# Patient Record
Sex: Female | Born: 1990 | Race: Black or African American | Hispanic: No | Smoking: Current every day smoker
Health system: Southern US, Community
[De-identification: ages and names within clinical notes are randomized; demographics above are authoritative.]

## PROBLEM LIST (undated history)

## (undated) ENCOUNTER — Inpatient Hospital Stay (HOSPITAL_COMMUNITY): Payer: Self-pay

## (undated) DIAGNOSIS — Z8614 Personal history of Methicillin resistant Staphylococcus aureus infection: Secondary | ICD-10-CM

## (undated) DIAGNOSIS — A549 Gonococcal infection, unspecified: Secondary | ICD-10-CM

## (undated) DIAGNOSIS — A599 Trichomoniasis, unspecified: Secondary | ICD-10-CM

## (undated) DIAGNOSIS — A749 Chlamydial infection, unspecified: Secondary | ICD-10-CM

## (undated) HISTORY — PX: NO PAST SURGERIES: SHX2092

## (undated) HISTORY — PX: MULTIPLE TOOTH EXTRACTIONS: SHX2053

---
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
## 2010-01-05 ENCOUNTER — Inpatient Hospital Stay (HOSPITAL_COMMUNITY)
Admission: AD | Admit: 2010-01-05 | Discharge: 2010-01-05 | Payer: Self-pay | Source: Home / Self Care | Admitting: Obstetrics & Gynecology

## 2010-01-05 IMAGING — US US OB COMP LESS 14 WK
1 series · 14 of 26 positions shown · non-contrast
Comparison: None.

CLINICAL DATA: Pregnant female with cramping abdominal pain.

OBSTETRIC <14 WK US AND TRANSVAGINAL OB US
TECHNIQUE: Both transabdominal and transvaginal ultrasound
examinations were performed for complete evaluation of the
gestation as well as the maternal uterus, adnexal regions, and
pelvic cul-de-sac.  Transvaginal technique was performed to assess
early pregnancy.

[Series 1: us ob comp less 14 wks · 0.21mm/px · 14 of 26 slices shown]
[im 1/26]
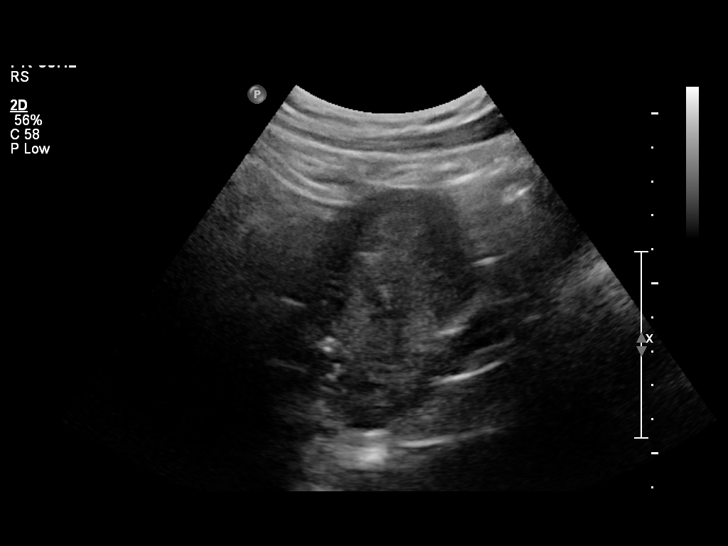
[im 3/26]
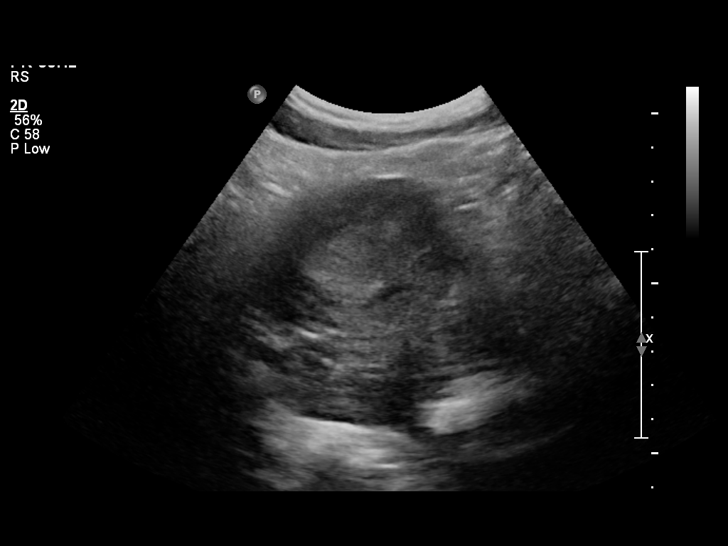
[im 5/26]
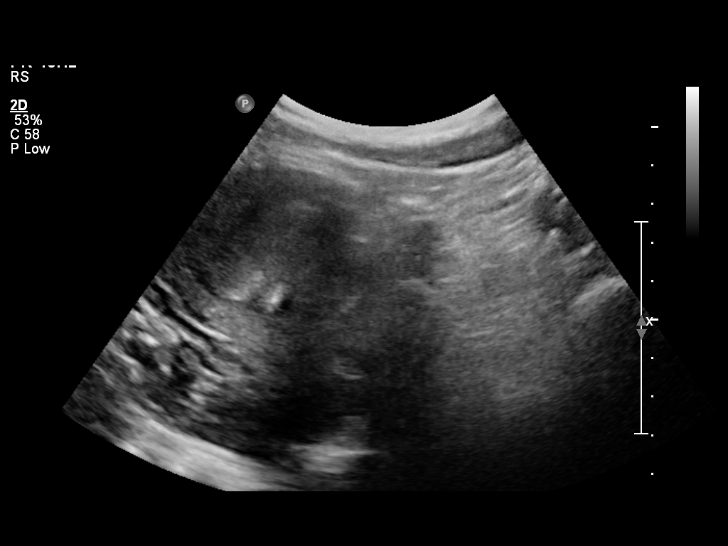
[im 7/26]
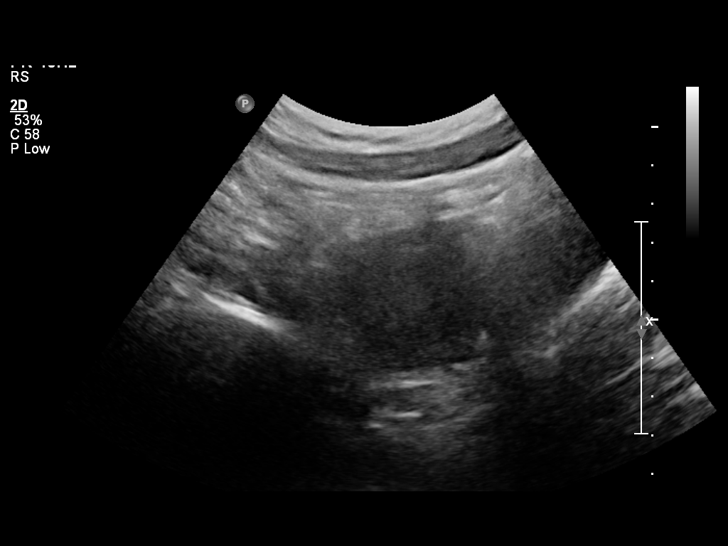
[im 9/26]
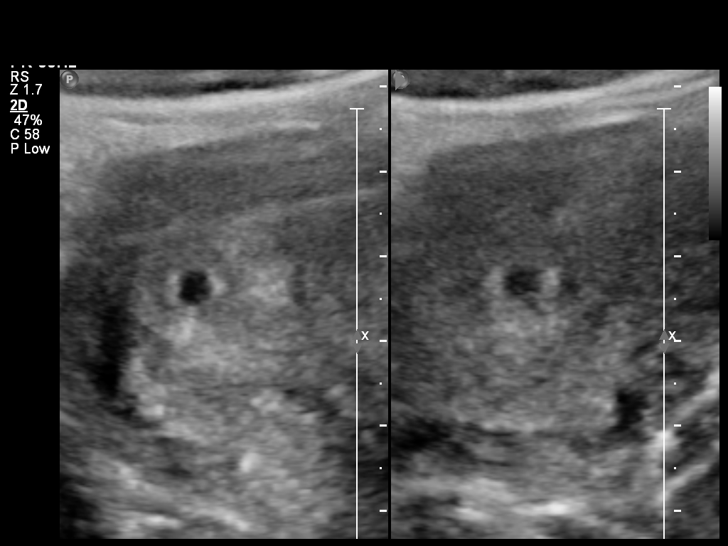
[im 11/26]
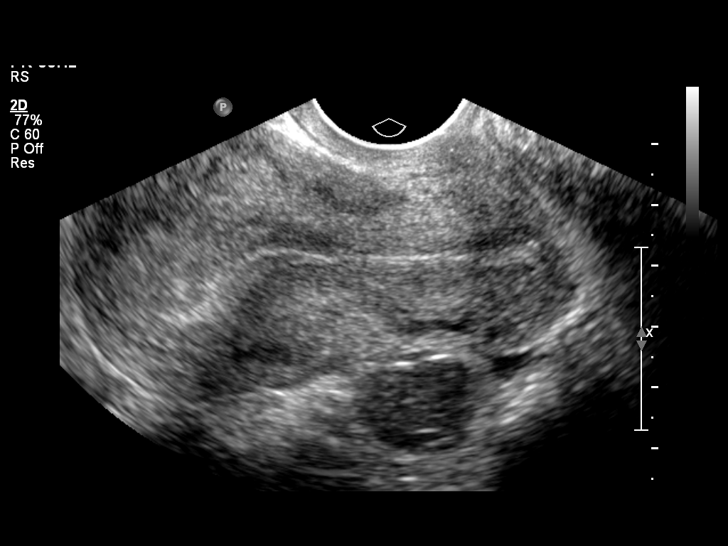
[im 13/26]
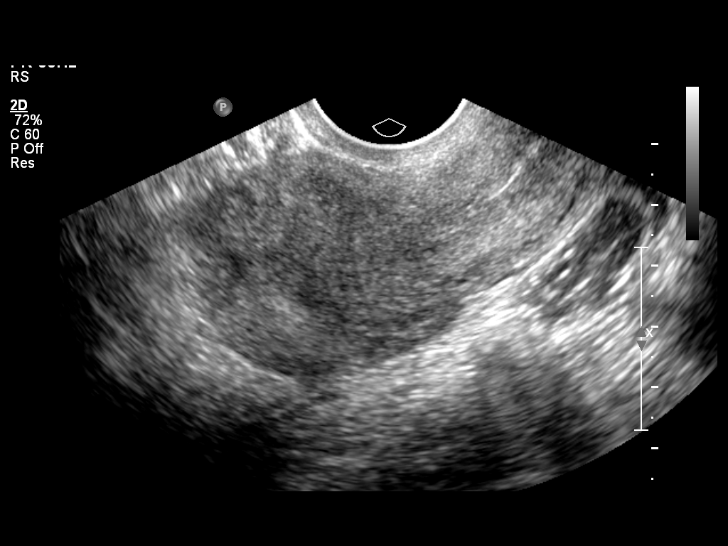
[im 14/26]
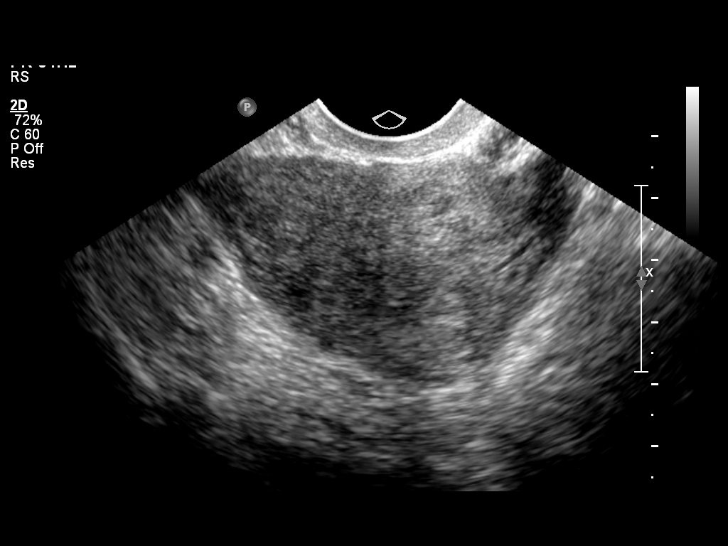
[im 16/26]
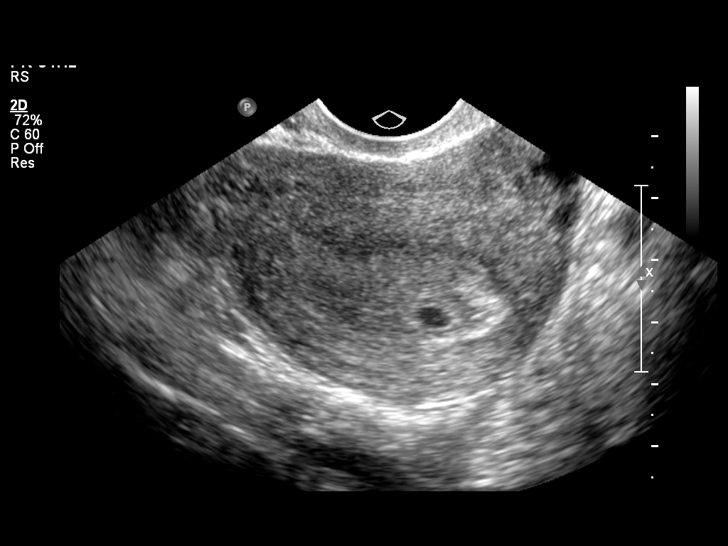
[im 18/26]
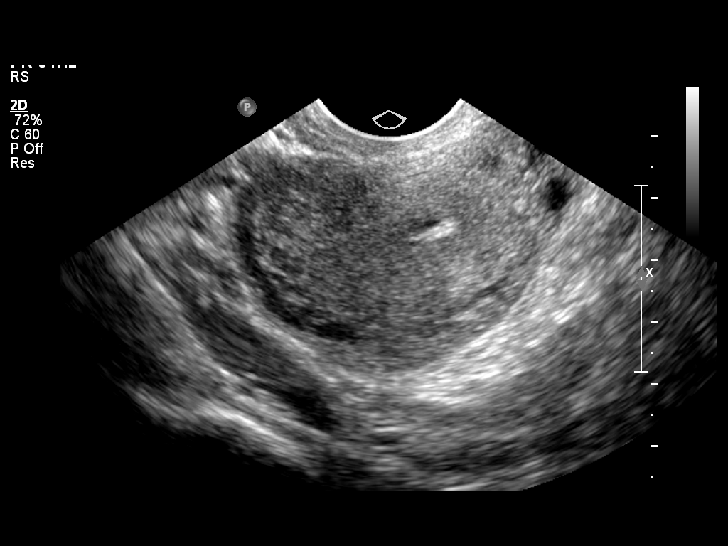
[im 20/26]
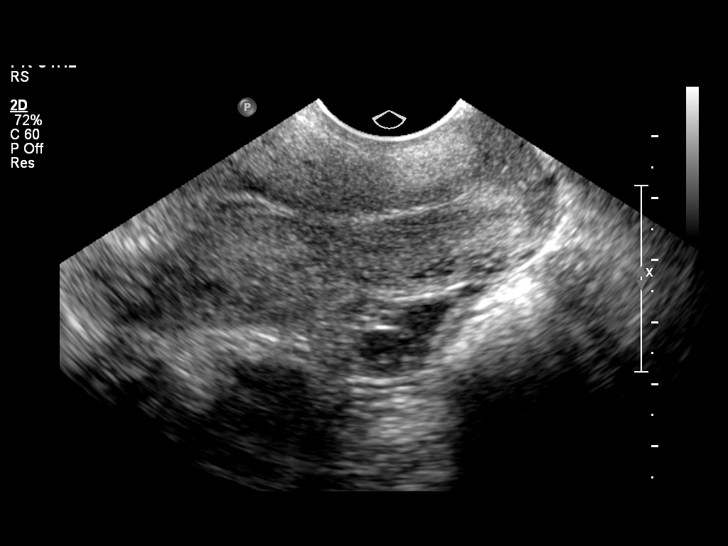
[im 22/26]
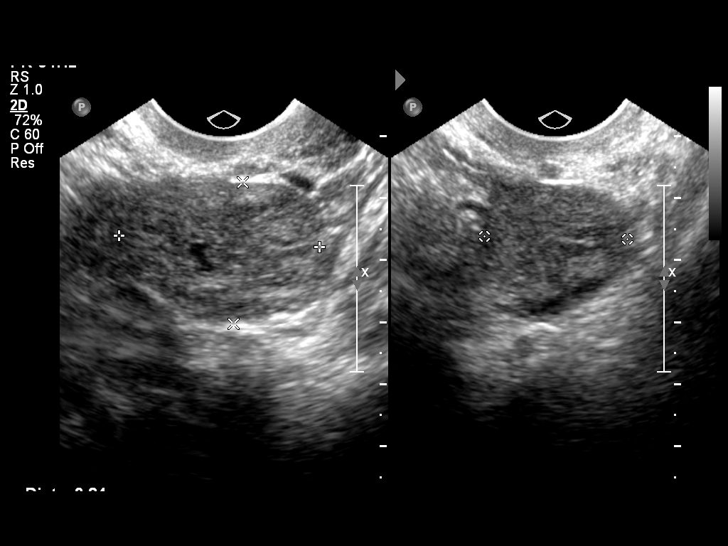
[im 24/26]
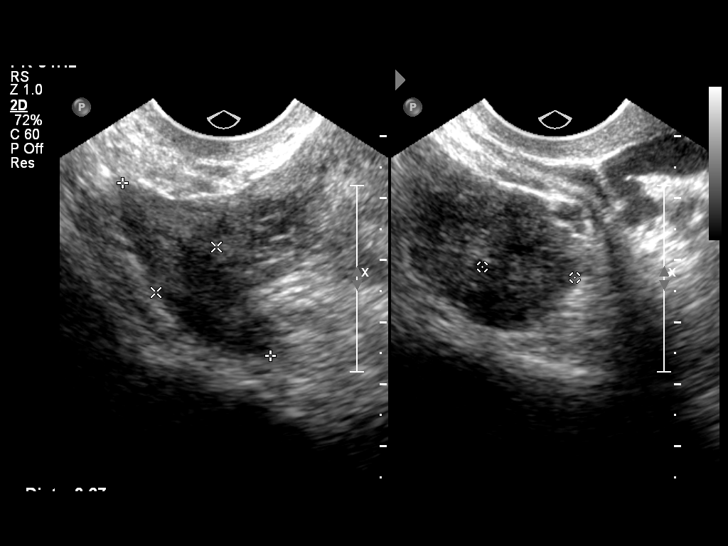
[im 26/26]
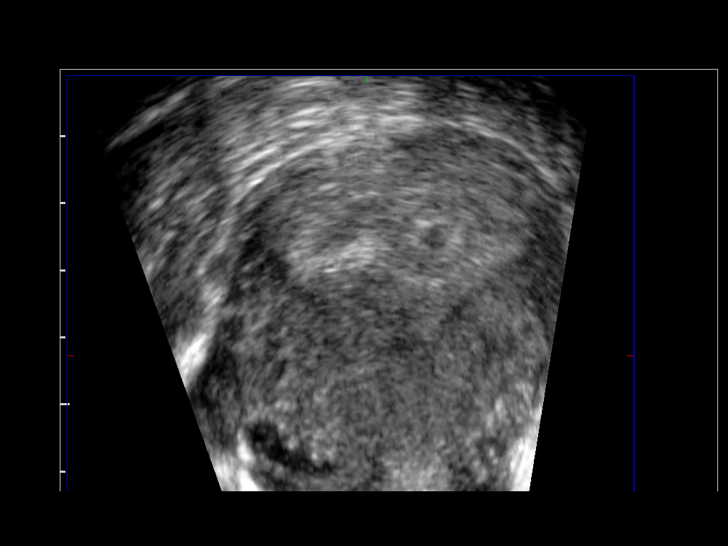

[14 of 26 positions shown; findings below may reference images not displayed]

Intrauterine gestational sac:  Single gestational sac is
identified.
Yolk sac: Not visualized
Embryo: Not visualized
Cardiac Activity: Not detected.
MSD: 4.4 mm sac diameter is too small to calculate gestational age.

Adnexa are unremarkable.
IMPRESSION: Single 4 mm cystic structure within the endometrial canal is
compatible with a gestational sac.  No evidence of ectopic
pregnancy is identified.  Recommend correlation with quantitative
HCG.  Repeat ultrasound as needed.

## 2010-01-07 ENCOUNTER — Ambulatory Visit (HOSPITAL_COMMUNITY)
Admission: RE | Admit: 2010-01-07 | Discharge: 2010-01-07 | Payer: Self-pay | Source: Home / Self Care | Admitting: Obstetrics & Gynecology

## 2010-01-09 ENCOUNTER — Inpatient Hospital Stay (HOSPITAL_COMMUNITY): Admission: AD | Admit: 2010-01-09 | Discharge: 2010-01-09 | Payer: Self-pay | Admitting: Obstetrics and Gynecology

## 2010-01-09 IMAGING — US US OB TRANSVAGINAL
1 series · 14 of 28 positions shown · non-contrast
Comparison: none

[Series 1: us ob transvaginal · 0.13mm/px · 14 of 29 slices shown]
[im 2/29]
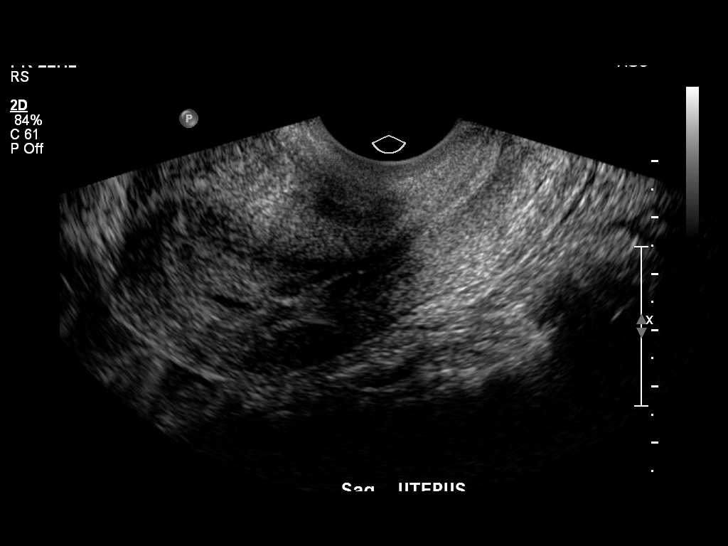
[im 4/29]
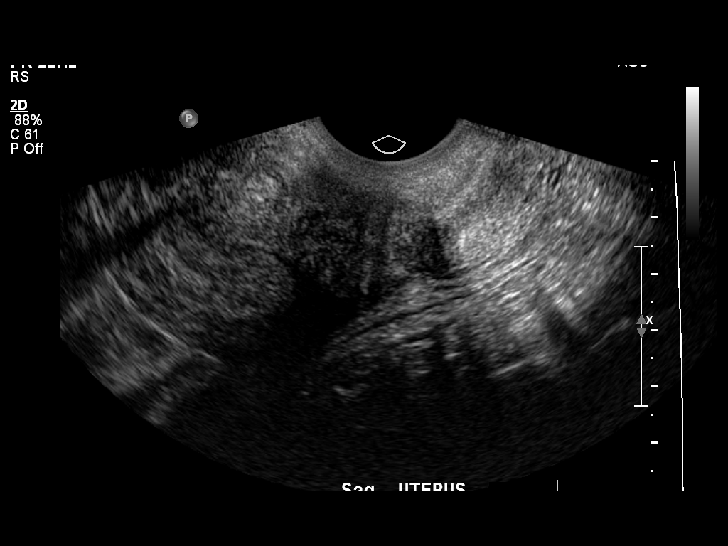
[im 6/29]
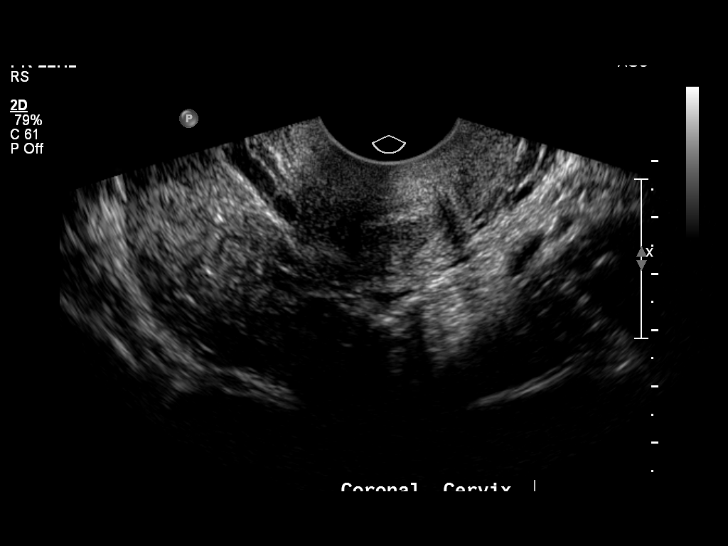
[im 8/29]
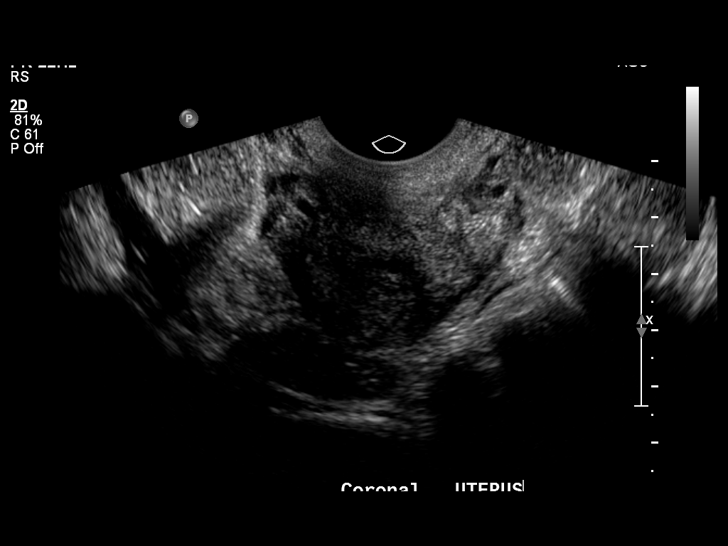
[im 10/29]
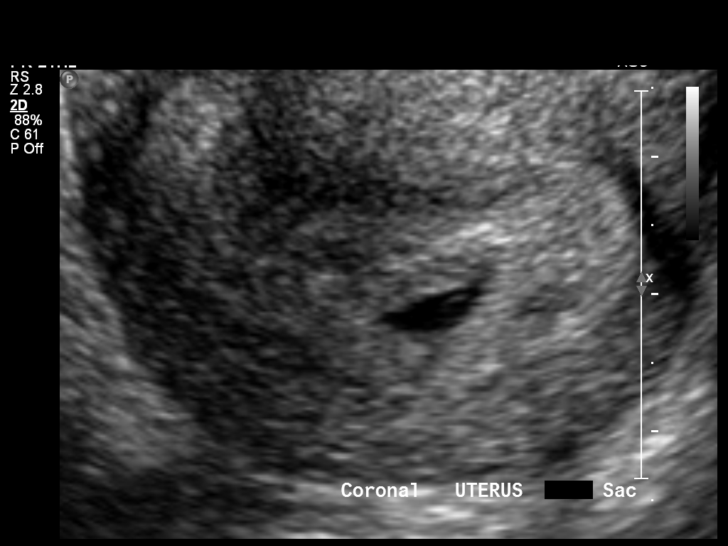
[im 12/29]
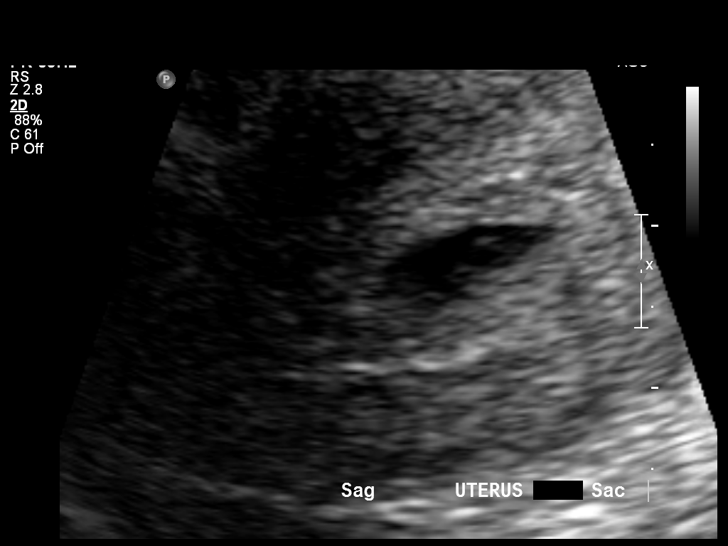
[im 14/29]
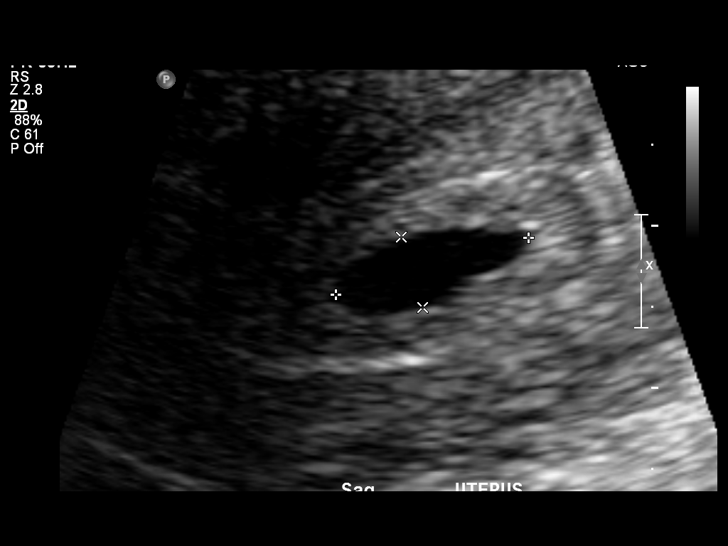
[im 16/29]
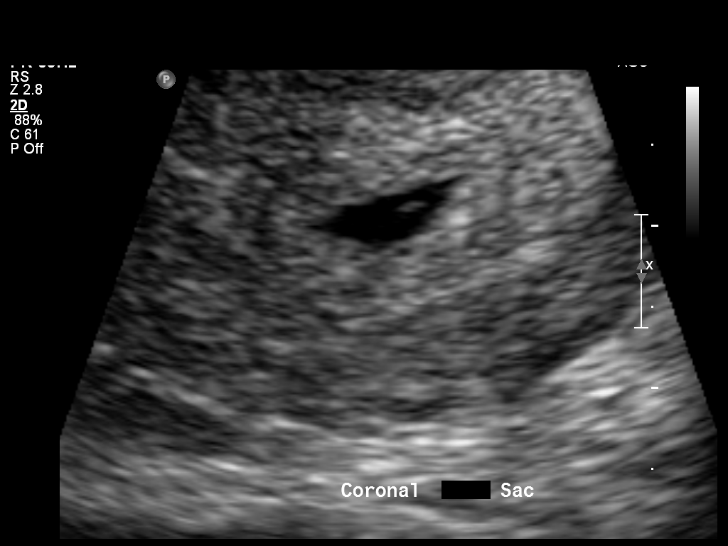
[im 18/29]
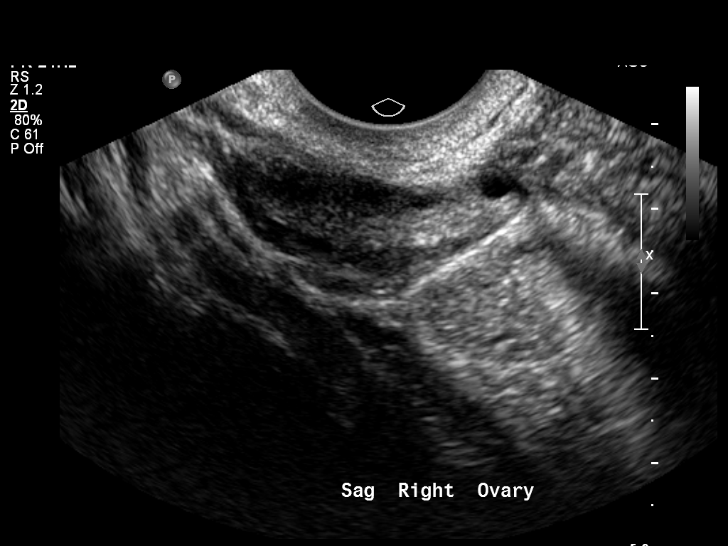
[im 20/29]
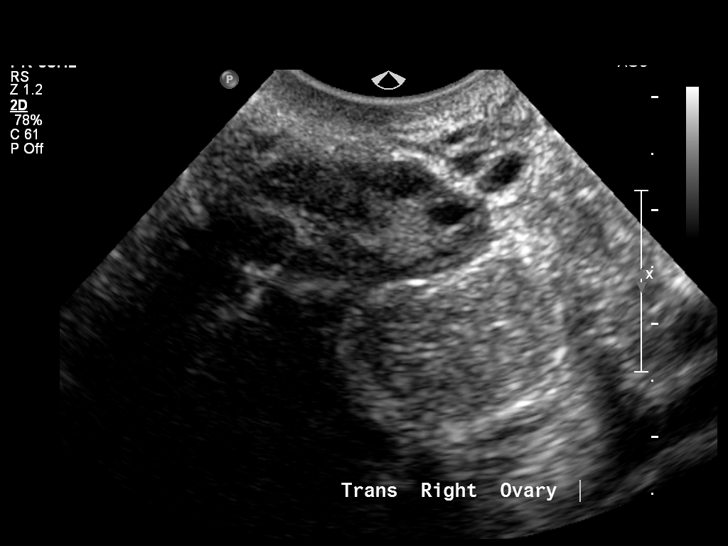
[im 22/29]
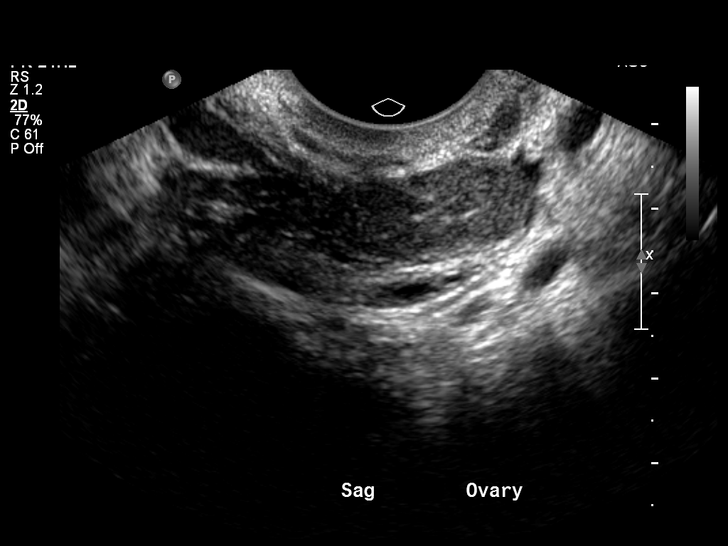
[im 24/29]
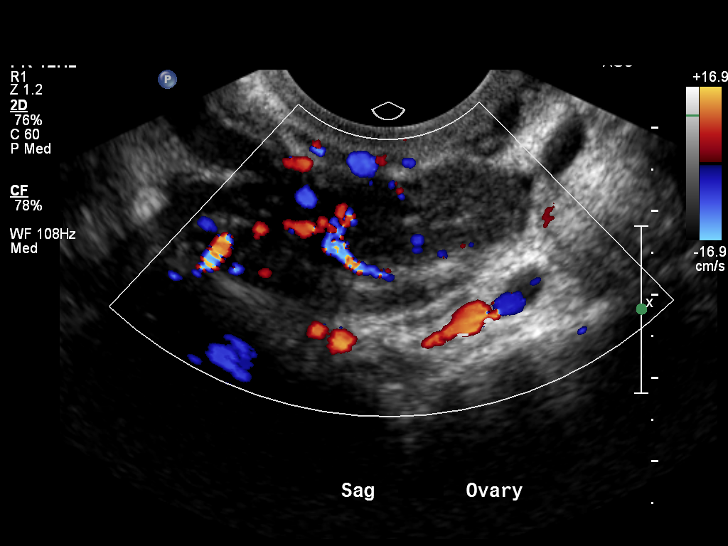
[im 26/29]
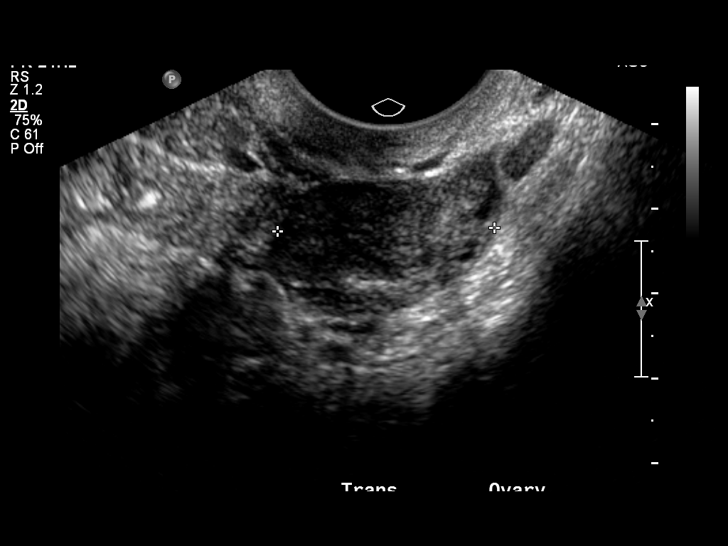
[im 29/29]
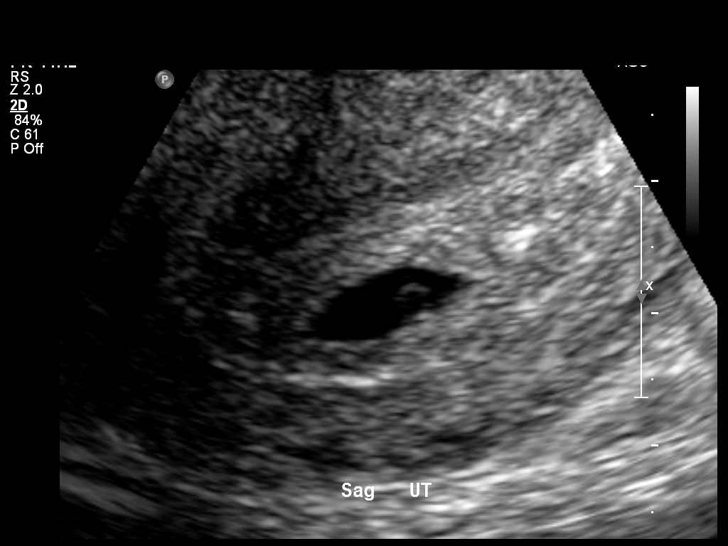

[14 of 28 positions shown; findings below may reference images not displayed]

OBSTETRICS REPORT
                      (Signed Final [DATE] [DATE])

Procedures

 US OB TRANSVAGINAL                                    76817.0
Indications

 Pain - Abdominal/Pelvic
 R/o ectopic pregnancy
Fetal Evaluation

 Preg. Location:    Intrauterine
 Gest. Sac:         Intrauterine
 Yolk Sac:          Visualized
 Fetal Pole:        Not visualized
 Cardiac Activity:  No embryo visualized
Biometry

 GS:       8.4  mm     G. Age:  5w 3d                  EDD:    [DATE]
Cervix Uterus Adnexa

 Cervix:       Normal appearance by transvaginal scan
 Left Ovary:    Within normal limits measuring 3.5 x 1.6 x 2.6 cm.
                Small corpus luteum noted.
 Right Ovary:   Within normal limits measuring 3.5 x 1.1 x 1.4 cm.
 Adnexa:     No abnormality visualized.
Impression

  Single intrauterine gestational sac with yolk sac. No fetal
 pole is yet seen, but not expected at today's MSD of 8.4mm.
 Given normal growth rates, we would expect to see a fetal
 pole in 10 days. Correlation with serial bHCG with follow up
 sonography as indicated can be obtained to confirm
 appropriate progression of gestation.

 Normal ovaries.

## 2010-01-13 ENCOUNTER — Emergency Department (HOSPITAL_COMMUNITY)
Admission: EM | Admit: 2010-01-13 | Discharge: 2010-01-13 | Payer: Self-pay | Source: Home / Self Care | Admitting: Emergency Medicine

## 2010-02-01 ENCOUNTER — Emergency Department (HOSPITAL_COMMUNITY)
Admission: EM | Admit: 2010-02-01 | Discharge: 2010-02-01 | Payer: Self-pay | Source: Home / Self Care | Admitting: Emergency Medicine

## 2010-02-15 NOTE — L&D Delivery Note (Addendum)
Delivery Note At 3:05 PM a viable female was delivered via Vaginal, Spontaneous Delivery (Presentation: Left Occiput Anterior).  APGAR: 9, 9; weight 6 lb 13 oz (3090 g).   Placenta status: Intact, Spontaneous.  Cord: 3 vessels with the following complications: None.  Cord pH: none  Anesthesia: None Local  Episiotomy: None Lacerations: 1st degree Suture Repair: 2.0 vicryl Est. Blood Loss (mL): 200  Mom to postpartum.  Baby to nursery-stable.  Brittany [name] A 08/30/2010, 3:34 PM

## 2010-02-17 ENCOUNTER — Inpatient Hospital Stay (HOSPITAL_COMMUNITY)
Admission: AD | Admit: 2010-02-17 | Discharge: 2010-02-17 | Payer: Self-pay | Source: Home / Self Care | Attending: Obstetrics & Gynecology | Admitting: Obstetrics & Gynecology

## 2010-03-08 ENCOUNTER — Encounter: Payer: Self-pay | Admitting: Obstetrics & Gynecology

## 2010-03-19 NOTE — Assessment & Plan Note (Signed)
Summary: rash,df   Vital Signs:  Patient profile:   20 year old female Height:      64.5 inches Weight:      197 pounds BMI:     33.41 Temp:     98.4 degrees F oral Pulse rate:   99 / minute BP sitting:   112 / 73  (right arm) Cuff size:   large  Vitals Entered By: Tessie Fass CMA (March 12, 2009 4:27 PM) CC: rash on chest and arms x 1 week Is Patient Diabetic? No Pain Assessment Patient in pain? no        Primary Care Provider:  Milinda Antis MD  CC:  rash on chest and arms x 1 week.  History of Present Illness: Brittany [name] comes in for evaluation of a skin rash for 1 week.  Small, oval, red, scaly lesions.  Do not itch.  STarted on her left breast but now on both breasts, chest, abdomen, and a few on her back.  Has not had before.  Does have allergies and asthma but has not had trouble with eczema in the past.   Habits & Providers  Alcohol-Tobacco-Diet     Tobacco Status: never  Allergies: 1)  ! * Antibiotic Ointment 2)  ! Sulfa  Physical Exam  General:  obese, alert, NAD Skin:  multiple small, oval patches oriented horizontally on the skin with collarettes of scale.  Scaling does not reach the border.  lesion are red. No evidence of infection or excoriation.    Impression & Recommendations:  Problem # 1:  PITYRIASIS ROSEA (ICD-696.3) Assessment New  kOH negative for fungus.  No itching.  Most consistent with pityriasis rosea.  Discussed with patient that we don't know exact cause.  Not thuoght to be contagious.  Goes away on its own but can take up to 2-3 months.  No treatment indcated as no known treatment to make it go away fast.  Usually doesn't itch but if develops itching can call out steroid cream or antihistamine pills if needed.   Orders: [facility]- Est  Level 4 (99214)  Complete Medication List: 1)  Proair Hfa 108 (90 Base) Mcg/act Aers (Albuterol sulfate) .... 2 puffs q 4hours as needed 2)  Singulair 10 Mg Tabs (Montelukast sodium) .... One by mouth  at bedtime 3)  One Spacer  .... Use with inhalers as directed 4)  Qvar 40 Mcg/act Aers (Beclomethasone dipropionate) .Marland Kitchen.. 1 puff two times a day 5)  Flexeril 10 Mg Tabs (Cyclobenzaprine hcl) .... Take 1/2 tab at bedtime as needed back spasm 6)  Seasonique 0.15-0.03 &0.01 Mg Tabs (Levonorgest-eth estrad 91-day) .Marland Kitchen.. 1 by mouth daily as prescribed 7)  Zithromax 1 Gm Pack (Azithromycin) .Marland Kitchen.. 1gram by mouth x 1  Other Orders: KOH-[facility] ([address]109)  Laboratory Results  Date/Time Received: March 12, 2009 4:42 PM  Date/Time Reported: March 12, 2009 5:00 PM   Other Tests  Skin KOH: Negative Comments: ...............test performed by......Marland KitchenBonnie A. [place], MLS (ASCP)cm

## 2010-03-19 NOTE — Miscellaneous (Signed)
Summary: RE: VISION  Clinical Lists Changes called patient's mom and explained that Shandon needs to schedule an appoitment for a vision screening so that medicaid will cover the well child visit on 03/03/2009. Explained to her that if she does not come in for the vision screening medicaid will not cover the visit and she will have to pay out of poket.Tessie Fass CMA,  March 07, 2009 4:36 PM

## 2010-03-19 NOTE — Assessment & Plan Note (Signed)
Summary: cpe,df  FLU SHOT GIVEN TODAY.Arlyss Repress CMA,  March 03, 2009 11:30 AM  Vital Signs:  Patient profile:   20 year old female Height:      64.5 inches Weight:      198 pounds BMI:     33.58 Temp:     98.0 degrees F oral Pulse rate:   72 / minute BP sitting:   120 / 82  (right arm) Cuff size:   large  Vitals Entered By: Tessie Fass CMA (March 03, 2009 9:49 AM) CC: complete physical with pap/contraception Is Patient Diabetic? No Pain Assessment Patient in pain? yes     Location: back Intensity: 5   Primary Care Provider:  Milinda Antis MD  CC:  complete physical with pap/contraception.  History of Present Illness:    20 y.o. female presents for PAP smear - No history of abnormal PAP, sexually active for > 2 years, has a 98 year old son  1. Contraception- LMP 1/8, regular, previously using Nuva ring, stopped 3 weeks ago, used condoms with single partner 2 weeks ago. Wants to try a different birth control because she feels she has to clean the nuva ring a lot. Thinking about seasonique vs IUD again- note history of self expulsion of IUD  2. Back Spasm- continue to have back spasm on and off, knows it is due to her weight and carrying her son. Has been previously seen for this. Flexeril as needed with motrin helps. Pain is near shoulder blades when carrying her son and her low back. Pain is non-radiating, achey feeling, no change in bowel/bladder, no weakness  3. STD- check  4. Obesity- lost 12 pounds since Sept, was on a veggie only diet for a while but then she felt sick, wants to start back exercising again.  5. Asthma- No recent exacerbations, needs inhalers  Habits & Providers  Alcohol-Tobacco-Diet     Tobacco Status: never  Current Medications (verified): 1)  Proair Hfa 108 (90 Base) Mcg/act  Aers (Albuterol Sulfate) .... 2 Puffs Q 4hours As Needed 2)  Singulair 10 Mg  Tabs (Montelukast Sodium) .... One By Mouth At Bedtime 3)  One Spacer .... Use  With Inhalers As Directed 4)  Qvar 40 Mcg/act Aers (Beclomethasone Dipropionate) .Marland Kitchen.. 1 Puff Two Times A Day 5)  Flexeril 10 Mg Tabs (Cyclobenzaprine Hcl) .... Take 1/2 Tab At Bedtime As Needed Back Spasm 6)  Seasonique 0.15-0.03 &0.01 Mg Tabs (Levonorgest-Eth Estrad 91-Day) .Marland Kitchen.. 1 By Mouth Daily As Prescribed  Allergies (verified): 1)  ! * Antibiotic Ointment 2)  ! Sulfa  Past History:  Past Medical History: Last updated: 07/16/2008 G1P1(first preg at 16) - NSVD asthma - hospitalized multiple times no intubations allergies IUD placed 10/2007 but self expulsed and then removed 10/2007  Physical Exam  General:  well developed, obese, in no acute distress Vital signs noted  Mouth:  MMM, no oral lesions Lungs:  CTAB Heart:  RRR without murmur Abdomen:  +BS, soft, NT/ND Genitalia:  Pelvic Exam External: normal female genitalia without lesions or masses Vagina: normal without lesions or masses Cervix: normal without lesions or masses, erythema around os, No CMT Adnexa: normal bimanual exam without masses or fullness  Uterus: normal by palpation Pap smear: Done Mild fishy odor, thin white discharge   Impression & Recommendations:  Problem # 1:  CONTRACEPTIVE MANAGEMENT (ICD-V25.09) Assessment New Discussed contraception and different forms, pt has tried many forms already, in agreement that she needs something , therefore will try the  3 month birth control,hopefully covered by medicaid, in the meantime she will think about the IUD again. Upreg-negative Orders: U Preg-[facility] (81025) [facility] - Est  18-39 yrs (16109)  Problem # 2:  Preventive Health Care (ICD-V70.0) Assessment: New Checked for STD per request. Wet prep shows BV but pt not complaining of any discharge, therefore will only treat if symptomatic  Problem # 3:  ASTHMA (ICD-493.90) Assessment: Unchanged  Her updated medication list for this problem includes:    Proair Hfa 108 (90 Base) Mcg/act Aers (Albuterol sulfate)  .Marland Kitchen... 2 puffs q 4hours as needed    Singulair 10 Mg Tabs (Montelukast sodium) ..... One by mouth at bedtime    Qvar 40 Mcg/act Aers (Beclomethasone dipropionate) .Marland Kitchen... 1 puff two times a day  Orders: [facility] - Est  18-39 yrs (60454)  Problem # 4:  BACK STRAIN (ICD-847.9) Assessment: Unchanged  I am glad pt is losing weight, this will definitely help her back pain. I did refill the flexeril with instructions on use  Orders: [facility] - Est  18-39 yrs ([id number])  Problem # 5:  CHILDHOOD OBESITY (ICD-278.00) Assessment: Improved 12pound weight loss, encouraged activity  Complete Medication List: 1)  Proair Hfa 108 (90 Base) Mcg/act Aers (Albuterol sulfate) .... 2 puffs q 4hours as needed 2)  Singulair 10 Mg Tabs (Montelukast sodium) .... One by mouth at bedtime 3)  One Spacer  .... Use with inhalers as directed 4)  Qvar 40 Mcg/act Aers (Beclomethasone dipropionate) .Marland Kitchen.. 1 puff two times a day 5)  Flexeril 10 Mg Tabs (Cyclobenzaprine hcl) .... Take 1/2 tab at bedtime as needed back spasm 6)  Seasonique 0.15-0.03 &0.01 Mg Tabs (Levonorgest-eth estrad 91-day) .Marland Kitchen.. 1 by mouth daily as prescribed  Other Orders: Pap Smear-[facility] ([id number]) GC/Chlamydia-[facility] ([id number]) [phone])  Patient Instructions: 1)  Return to see me in 1 month after starting the seasonique 2)  You can start the birth control today or wait until the first day of your next period 3)  Continue to use your condoms as the birth control will not protect against STD's 4)  I have refilled your other meds 5)  I will call you with your lab results if positive.If not you will get a letter in the mail. Prescriptions: QVAR 40 MCG/ACT AERS (BECLOMETHASONE DIPROPIONATE) 1 puff two times a day  #1 x 6   Entered and Authorized by:   Milinda Antis MD   Signed by:   Milinda Antis MD on 03/03/2009   Method used:   Electronically to        [facility] #[id number]* (retail)       [address]        [place], [place]  [address]       Ph: [phone]       Fax: [phone]   RxID:   [id number] SINGULAIR 10 MG  TABS (MONTELUKAST SODIUM) one by mouth at bedtime  #30 x 0   Entered and Authorized by:   Milinda Antis MD   Signed by:   Milinda Antis MD on 03/03/2009   Method used:   Electronically to        [facility] #[id number]* (retail)       [address]       [place], [place]  [address]       Ph: [phone]  Fax: [phone]   RxID:   [id number] PROAIR HFA 108 (90 BASE) MCG/ACT  AERS (ALBUTEROL SULFATE) 2 puffs q 4hours as needed  #18.0 Gram x 2   Entered and Authorized by:   Milinda Antis MD   Signed by:   Milinda Antis MD on 03/03/2009   Method used:   Electronically to        [facility] #[id number]* (retail)       [address]       [place], [place]  [address]       Ph: [phone]       Fax: [phone]   RxID:   [id number] SEASONIQUE 0.15-0.03 &0.01 MG TABS (LEVONORGEST-ETH ESTRAD 91-DAY) 1 by mouth daily as prescribed  #1 x 2   Entered and Authorized by:   Milinda Antis MD   Signed by:   Milinda Antis MD on 03/03/2009   Method used:   Electronically to        [facility] #[id number]* (retail)       [address]       [place], [place]  [address]       Ph: [phone]       Fax: [phone]   RxID:   [id number] FLEXERIL 10 MG TABS (CYCLOBENZAPRINE HCL) take 1/2 tab at bedtime as needed back spasm  #30 x 2   Entered and Authorized by:   Milinda Antis MD   Signed by:   Milinda Antis MD on 03/03/2009   Method used:   Electronically to        [facility] #[id number]* (retail)       [address]       [place], [place]  [address]       Ph: [phone]       Fax: [phone]   RxID:   [id number]   Laboratory Results   Urine Tests  Date/Time  Received: March 03, 2009 10:21 AM  Date/Time Reported: March 03, 2009 10:25 AM     Urine HCG: negative Comments: ...........test performed by...........Marland KitchenTerese Door, CMA  Date/Time Received: March 03, 2009 10:21 AM  Date/Time Reported: March 03, 2009 10:27 AM   [place] Source: vaginal WBC/hpf: 10-15 Bacteria/hpf: 3+  Cocci Clue cells/hpf: moderate  Positive whiff Yeast/hpf: none Trichomonas/hpf: none Comments: ...........test performed by...........Marland KitchenTerese Door, CMA     Appended Document: cpe,df     Vision Screening:Left eye w/o correction: 20 / 20 Right Eye w/o correction: 20 / 20 Both eyes w/o correction:  20/ 20        Vision Entered By: Tessie Fass CMA (March 12, 2009 4:42 PM)   Allergies: 1)  ! * Antibiotic Ointment 2)  ! Sulfa   Complete Medication List: 1)  Proair Hfa 108 (90 Base) Mcg/act Aers (Albuterol sulfate) .... 2 puffs q 4hours as needed 2)  Singulair 10 Mg Tabs (Montelukast sodium) .... One by mouth at bedtime 3)  One Spacer  .... Use with inhalers as directed 4)  Qvar 40 Mcg/act Aers (Beclomethasone dipropionate) .Marland Kitchen.. 1 puff two times a day 5)  Flexeril 10 Mg Tabs (Cyclobenzaprine hcl) .... Take 1/2 tab at bedtime as needed back spasm 6)  Seasonique 0.15-0.03 &0.01 Mg Tabs (Levonorgest-eth estrad 91-day) .Marland Kitchen.. 1 by mouth daily as prescribed 7)  Zithromax 1 Gm Pack (Azithromycin) .Marland KitchenMarland KitchenMarland Kitchen  1gram by mouth x 1

## 2010-03-19 NOTE — Progress Notes (Signed)
Summary: triage  Phone Note Call from Patient Call back at [phone]   Caller: Patient Summary of Call: thinks she has strep throat Initial call taken by: De Nurse,  April 17, 2009 8:35 AM  Follow-up for Phone Call        sore throat. difficult to eat or drink. taking theraflu.  has not taken any tylenol or ibu. told her to take now. while & was looking for an appt, she said she would just go to the ED. told her a sore throat was not appropriate use of ED. placed on colpo at 9:30. she kept saying she had to be seen asap as she had to go to work Follow-up by: Golden Circle RN,  April 17, 2009 8:38 AM

## 2010-03-19 NOTE — Progress Notes (Signed)
  Phone Note Outgoing Call   Call placed by: Milinda Antis MD,  March 05, 2009 4:46 PM Details for Reason: Results Summary of Call: Left another message for patient. She is to return my call. Chlamydia was positive, i sent meds to pharmacy     Appended Document:  Late entry- I left a message with mother and answering machine yesterday regarding above. 03/12/09

## 2010-03-19 NOTE — Miscellaneous (Signed)
Summary: prior auth  Clinical Lists Changes prior auth for singulair to medicaid.Golden Circle RN  March 04, 2009 4:27 PM  it was approved. faxed approval to her pharmacy.Golden Circle RN  March 05, 2009 12:02 PM

## 2010-03-27 ENCOUNTER — Inpatient Hospital Stay (HOSPITAL_COMMUNITY)
Admission: AD | Admit: 2010-03-27 | Discharge: 2010-03-27 | Disposition: A | Discharge status: Home / Self Care | Payer: Medicaid Other | Source: Ambulatory Visit | Attending: Obstetrics and Gynecology | Admitting: Obstetrics and Gynecology

## 2010-03-27 ENCOUNTER — Other Ambulatory Visit: Payer: Self-pay | Admitting: Obstetrics and Gynecology

## 2010-03-27 DIAGNOSIS — B9689 Other specified bacterial agents as the cause of diseases classified elsewhere: Secondary | ICD-10-CM

## 2010-03-27 DIAGNOSIS — N760 Acute vaginitis: Secondary | ICD-10-CM | POA: Insufficient documentation

## 2010-03-27 DIAGNOSIS — O2390 Unspecified genitourinary tract infection in pregnancy, unspecified trimester: Secondary | ICD-10-CM

## 2010-03-27 DIAGNOSIS — R109 Unspecified abdominal pain: Secondary | ICD-10-CM | POA: Insufficient documentation

## 2010-03-27 DIAGNOSIS — A499 Bacterial infection, unspecified: Secondary | ICD-10-CM | POA: Insufficient documentation

## 2010-03-27 DIAGNOSIS — K5900 Constipation, unspecified: Secondary | ICD-10-CM | POA: Insufficient documentation

## 2010-04-16 LAB — CBC
HCT: 33.6 % — ABNORMAL LOW (ref 36.0–46.0)
Hemoglobin: 11.6 g/dL — ABNORMAL LOW (ref 12.0–15.0)
MCH: 27.8 pg (ref 26.0–34.0)
MCHC: 34.5 g/dL (ref 30.0–36.0)
MCV: 80.4 fL (ref 78.0–100.0)
Platelets: 221 10*3/uL (ref 150–400)
RBC: 4.18 MIL/uL (ref 3.87–5.11)
RDW: 13.2 % (ref 11.5–15.5)
WBC: 9.4 10*3/uL (ref 4.0–10.5)

## 2010-04-16 LAB — COMPREHENSIVE METABOLIC PANEL
Albumin: 2.9 g/dL — ABNORMAL LOW (ref 3.5–5.2)
Alkaline Phosphatase: 49 U/L (ref 39–117)
BUN: 5 mg/dL — ABNORMAL LOW (ref 6–23)
CO2: 23 mEq/L (ref 19–32)
Chloride: 105 mEq/L (ref 96–112)
GFR calc non Af Amer: >60 mL/min (ref >60)
Glucose, Bld: 74 mg/dL (ref 70–99)
Potassium: 3.6 mEq/L (ref 3.5–5.1)
Total Bilirubin: 0.5 mg/dL (ref 0.3–1.2)

## 2010-04-16 LAB — GC/CHLAMYDIA PROBE AMP, GENITAL
Chlamydia, DNA Probe: NEGATIVE
GC Probe Amp, Genital: NEGATIVE

## 2010-04-16 LAB — URINALYSIS, ROUTINE W REFLEX MICROSCOPIC
Hgb urine dipstick: NEGATIVE
Nitrite: NEGATIVE
Protein, ur: NEGATIVE mg/dL
Urobilinogen, UA: 2 mg/dL — ABNORMAL HIGH (ref 0.0–1.0)

## 2010-04-16 LAB — WET PREP, GENITAL
Trich, Wet Prep: NONE SEEN
Yeast Wet Prep HPF POC: NONE SEEN

## 2010-04-27 LAB — URINALYSIS, ROUTINE W REFLEX MICROSCOPIC
Bilirubin Urine: NEGATIVE
Glucose, UA: NEGATIVE mg/dL
Hgb urine dipstick: NEGATIVE
Ketones, ur: NEGATIVE mg/dL
Nitrite: NEGATIVE
Protein, ur: NEGATIVE mg/dL
Specific Gravity, Urine: 1.025 (ref 1.005–1.030)
Urobilinogen, UA: 1 mg/dL (ref 0.0–1.0)
pH: 6 (ref 5.0–8.0)

## 2010-04-28 LAB — URINALYSIS, ROUTINE W REFLEX MICROSCOPIC
Bilirubin Urine: NEGATIVE
Glucose, UA: NEGATIVE mg/dL
Glucose, UA: NEGATIVE mg/dL
Hgb urine dipstick: NEGATIVE
Hgb urine dipstick: NEGATIVE
Ketones, ur: NEGATIVE mg/dL
Ketones, ur: NEGATIVE mg/dL
Protein, ur: NEGATIVE mg/dL
Protein, ur: NEGATIVE mg/dL
Urobilinogen, UA: 1 mg/dL (ref 0.0–1.0)

## 2010-04-28 LAB — CBC
MCH: 28.3 pg (ref 26.0–34.0)
MCV: 83.6 fL (ref 78.0–100.0)
Platelets: 228 10*3/uL (ref 150–400)
RDW: 15.2 % (ref 11.5–15.5)
WBC: 7.6 10*3/uL (ref 4.0–10.5)

## 2010-04-28 LAB — URINE MICROSCOPIC-ADD ON

## 2010-04-28 LAB — URINE CULTURE
Colony Count: NO GROWTH
Culture  Setup Time: 201111231556

## 2010-04-28 LAB — GC/CHLAMYDIA PROBE AMP, GENITAL: Chlamydia, DNA Probe: NEGATIVE

## 2010-04-28 LAB — WET PREP, GENITAL: Yeast Wet Prep HPF POC: NONE SEEN

## 2010-04-28 LAB — POCT PREGNANCY, URINE: Preg Test, Ur: POSITIVE

## 2010-04-28 LAB — HCG, QUANTITATIVE, PREGNANCY: hCG, Beta Chain, Quant, S: 3103 m[IU]/mL — ABNORMAL HIGH (ref <5)

## 2010-05-11 LAB — RAPID STREP SCREEN (MED CTR MEBANE ONLY): Streptococcus, Group A Screen (Direct): POSITIVE — AB

## 2010-05-13 ENCOUNTER — Other Ambulatory Visit: Payer: Self-pay | Admitting: Family Medicine

## 2010-05-13 NOTE — Telephone Encounter (Signed)
Refill request

## 2010-06-11 ENCOUNTER — Other Ambulatory Visit: Payer: Self-pay | Admitting: Family Medicine

## 2010-06-11 NOTE — Telephone Encounter (Signed)
Refill request

## 2010-06-18 ENCOUNTER — Inpatient Hospital Stay (HOSPITAL_COMMUNITY)
Admission: AD | Admit: 2010-06-18 | Discharge: 2010-06-18 | Disposition: A | Discharge status: Home / Self Care | Payer: Medicaid Other | Source: Ambulatory Visit | Attending: Obstetrics | Admitting: Obstetrics

## 2010-06-18 DIAGNOSIS — B9689 Other specified bacterial agents as the cause of diseases classified elsewhere: Secondary | ICD-10-CM

## 2010-06-18 DIAGNOSIS — O2390 Unspecified genitourinary tract infection in pregnancy, unspecified trimester: Secondary | ICD-10-CM

## 2010-06-18 DIAGNOSIS — R109 Unspecified abdominal pain: Secondary | ICD-10-CM

## 2010-06-18 DIAGNOSIS — N760 Acute vaginitis: Secondary | ICD-10-CM | POA: Insufficient documentation

## 2010-06-18 DIAGNOSIS — A499 Bacterial infection, unspecified: Secondary | ICD-10-CM | POA: Insufficient documentation

## 2010-06-18 LAB — URINALYSIS, ROUTINE W REFLEX MICROSCOPIC
Bilirubin Urine: NEGATIVE
Glucose, UA: NEGATIVE mg/dL
Hgb urine dipstick: NEGATIVE
Nitrite: NEGATIVE
Specific Gravity, Urine: 1.015 (ref 1.005–1.030)
pH: 6.5 (ref 5.0–8.0)

## 2010-06-18 LAB — WET PREP, GENITAL: Trich, Wet Prep: NONE SEEN

## 2010-06-19 LAB — GC/CHLAMYDIA PROBE AMP, GENITAL: Chlamydia, DNA Probe: NEGATIVE
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
## 2010-08-11 ENCOUNTER — Inpatient Hospital Stay (HOSPITAL_COMMUNITY)
Admission: AD | Admit: 2010-08-11 | Discharge: 2010-08-12 | Disposition: A | Discharge status: Home / Self Care | Payer: Medicaid Other | Source: Ambulatory Visit | Attending: Obstetrics | Admitting: Obstetrics

## 2010-08-11 DIAGNOSIS — O99891 Other specified diseases and conditions complicating pregnancy: Secondary | ICD-10-CM

## 2010-08-11 DIAGNOSIS — K047 Periapical abscess without sinus: Secondary | ICD-10-CM

## 2010-08-11 DIAGNOSIS — O9989 Other specified diseases and conditions complicating pregnancy, childbirth and the puerperium: Secondary | ICD-10-CM

## 2010-08-28 ENCOUNTER — Inpatient Hospital Stay (HOSPITAL_COMMUNITY)
Admission: AD | Admit: 2010-08-28 | Discharge: 2010-08-29 | Disposition: A | Discharge status: Home / Self Care | Payer: Medicaid Other | Source: Ambulatory Visit | Attending: Obstetrics | Admitting: Obstetrics

## 2010-08-28 DIAGNOSIS — O479 False labor, unspecified: Secondary | ICD-10-CM | POA: Insufficient documentation

## 2010-08-29 ENCOUNTER — Encounter (HOSPITAL_COMMUNITY): Payer: Self-pay | Admitting: *Deleted

## 2010-08-29 MED ORDER — KETOROLAC TROMETHAMINE 30 MG/ML IJ SOLN
30.0000 mg | Freq: Once | INTRAMUSCULAR | Status: DC
  Filled 2010-08-29: qty 1

## 2010-08-29 NOTE — Progress Notes (Signed)
Pt states, " I starteded having contractions at 1400 and they have been  regular since 2130, and they are about every five min. "

## 2010-08-29 NOTE — Progress Notes (Signed)
Pt is here with c/o ctx q5-26mins since 1400pm. She denies any vaginal bleeding, lof. She reports good fetal movement. And states that she 1.5cm/100% effaced by dr Clearance Coots on Thursday.

## 2010-08-30 ENCOUNTER — Inpatient Hospital Stay (HOSPITAL_COMMUNITY)
Admission: AD | Admit: 2010-08-30 | Discharge: 2010-09-01 | DRG: 775 | Disposition: A | Discharge status: Home / Self Care | Payer: Medicaid Other | Source: Ambulatory Visit | Attending: Obstetrics & Gynecology | Admitting: Obstetrics & Gynecology

## 2010-08-30 ENCOUNTER — Encounter (HOSPITAL_COMMUNITY): Payer: Self-pay | Admitting: *Deleted

## 2010-08-30 LAB — CBC
Hemoglobin: 10.4 g/dL — ABNORMAL LOW (ref 12.0–15.0)
MCHC: 34.1 g/dL (ref 30.0–36.0)
Platelets: 223 10*3/uL (ref 150–400)

## 2010-08-30 LAB — TYPE AND SCREEN: Antibody Screen: NEGATIVE

## 2010-08-30 LAB — STREP B DNA PROBE

## 2010-08-30 LAB — ABO/RH: RH Type: POSITIVE

## 2010-08-30 MED ORDER — DIPHENHYDRAMINE HCL 25 MG PO CAPS: 25.0000 mg | ORAL_CAPSULE | Freq: Four times a day (QID) | ORAL | Status: DC | PRN

## 2010-08-30 MED ORDER — SIMETHICONE 80 MG PO CHEW: 80.0000 mg | CHEWABLE_TABLET | ORAL | Status: DC | PRN

## 2010-08-30 MED ORDER — OXYTOCIN 20 UNITS IN LACTATED RINGERS INFUSION - SIMPLE
125.0000 mL/h | Freq: Once | INTRAVENOUS | Status: AC
  Administered 2010-08-30: 125 mL/h via INTRAVENOUS
  Filled 2010-08-30 (×2): qty 1000

## 2010-08-30 MED ORDER — LANOLIN HYDROUS EX OINT: TOPICAL_OINTMENT | CUTANEOUS | Status: DC | PRN

## 2010-08-30 MED ORDER — OXYCODONE-ACETAMINOPHEN 5-325 MG PO TABS: 1.0000 | ORAL_TABLET | ORAL | Status: DC | PRN

## 2010-08-30 MED ORDER — SODIUM CHLORIDE 0.9 % IV SOLN: 250.0000 mL | INTRAVENOUS | Status: DC

## 2010-08-30 MED ORDER — LACTATED RINGERS IV SOLN
INTRAVENOUS | Status: DC
  Administered 2010-08-30: 14:00:00 via INTRAVENOUS

## 2010-08-30 MED ORDER — WITCH HAZEL-GLYCERIN EX PADS: MEDICATED_PAD | CUTANEOUS | Status: DC | PRN

## 2010-08-30 MED ORDER — SENNOSIDES-DOCUSATE SODIUM 8.6-50 MG PO TABS
1.0000 | ORAL_TABLET | Freq: Every day | ORAL | Status: DC
  Administered 2010-08-30: 2 via ORAL
  Administered 2010-08-31: 1 via ORAL

## 2010-08-30 MED ORDER — ACETAMINOPHEN 325 MG PO TABS: 650.0000 mg | ORAL_TABLET | ORAL | Status: DC | PRN

## 2010-08-30 MED ORDER — BENZOCAINE-MENTHOL 20-0.5 % EX AERO: 1.0000 "application " | INHALATION_SPRAY | CUTANEOUS | Status: DC | PRN

## 2010-08-30 MED ORDER — CITRIC ACID-SODIUM CITRATE 334-500 MG/5ML PO SOLN: 30.0000 mL | ORAL | Status: DC | PRN

## 2010-08-30 MED ORDER — SODIUM CHLORIDE 0.9 % IV SOLN
2.0000 g | Freq: Four times a day (QID) | INTRAVENOUS | Status: DC
  Administered 2010-08-30: 2 g via INTRAVENOUS
  Filled 2010-08-30 (×3): qty 2000

## 2010-08-30 MED ORDER — LIDOCAINE HCL (PF) 1 % IJ SOLN
30.0000 mL | INTRAMUSCULAR | Status: DC | PRN
  Filled 2010-08-30 (×2): qty 30

## 2010-08-30 MED ORDER — OXYTOCIN 20 UNITS IN LACTATED RINGERS INFUSION - SIMPLE: 125.0000 mL/h | INTRAVENOUS | Status: DC | PRN

## 2010-08-30 MED ORDER — ONDANSETRON HCL 4 MG/2ML IJ SOLN: 4.0000 mg | INTRAMUSCULAR | Status: DC | PRN

## 2010-08-30 MED ORDER — ZOLPIDEM TARTRATE 5 MG PO TABS: 5.0000 mg | ORAL_TABLET | Freq: Every evening | ORAL | Status: DC | PRN

## 2010-08-30 MED ORDER — IBUPROFEN 600 MG PO TABS
600.0000 mg | ORAL_TABLET | Freq: Four times a day (QID) | ORAL | Status: DC | PRN
  Administered 2010-08-30: 600 mg via ORAL

## 2010-08-30 MED ORDER — ALBUTEROL SULFATE HFA 108 (90 BASE) MCG/ACT IN AERS
1.0000 | INHALATION_SPRAY | RESPIRATORY_TRACT | Status: DC | PRN
  Filled 2010-08-30: qty 6.7

## 2010-08-30 MED ORDER — SODIUM CHLORIDE 0.9 % IJ SOLN
3.0000 mL | Freq: Two times a day (BID) | INTRAMUSCULAR | Status: DC
  Administered 2010-08-30: 3 mL via INTRAVENOUS

## 2010-08-30 MED ORDER — NALBUPHINE SYRINGE 5 MG/0.5 ML
10.0000 mg | INJECTION | INTRAMUSCULAR | Status: DC | PRN
  Filled 2010-08-30: qty 1

## 2010-08-30 MED ORDER — ONDANSETRON HCL 4 MG PO TABS: 4.0000 mg | ORAL_TABLET | ORAL | Status: DC | PRN

## 2010-08-30 MED ORDER — ONDANSETRON HCL 4 MG/2ML IJ SOLN: 4.0000 mg | Freq: Four times a day (QID) | INTRAMUSCULAR | Status: DC | PRN

## 2010-08-30 MED ORDER — SODIUM CHLORIDE 0.9 % IJ SOLN: 3.0000 mL | INTRAMUSCULAR | Status: DC | PRN

## 2010-08-30 MED ORDER — IBUPROFEN 600 MG PO TABS
600.0000 mg | ORAL_TABLET | Freq: Four times a day (QID) | ORAL | Status: DC
  Administered 2010-08-30 – 2010-09-01 (×7): 600 mg via ORAL
  Filled 2010-08-30 (×7): qty 1

## 2010-08-30 MED ORDER — FERROUS SULFATE 325 (65 FE) MG PO TABS
325.0000 mg | ORAL_TABLET | Freq: Two times a day (BID) | ORAL | Status: DC
  Administered 2010-08-31 (×2): 325 mg via ORAL
  Filled 2010-08-30 (×3): qty 1

## 2010-08-30 MED ORDER — FLEET ENEMA 7-19 GM/118ML RE ENEM: 1.0000 | ENEMA | RECTAL | Status: DC | PRN

## 2010-08-30 MED ORDER — OXYCODONE-ACETAMINOPHEN 5-325 MG PO TABS
2.0000 | ORAL_TABLET | ORAL | Status: DC | PRN
  Administered 2010-08-30: 2 via ORAL

## 2010-08-30 MED ORDER — PRENATAL PLUS 27-1 MG PO TABS
1.0000 | ORAL_TABLET | Freq: Every day | ORAL | Status: DC
  Administered 2010-08-31 – 2010-09-01 (×2): 1 via ORAL
  Filled 2010-08-30 (×2): qty 1

## 2010-08-30 MED ORDER — LACTATED RINGERS IV SOLN: 500.0000 mL | INTRAVENOUS | Status: DC | PRN

## 2010-08-30 MED ORDER — TETANUS-DIPHTH-ACELL PERTUSSIS 5-2.5-18.5 LF-MCG/0.5 IM SUSP
0.5000 mL | Freq: Once | INTRAMUSCULAR | Status: AC
  Administered 2010-08-31: 0.5 mL via INTRAMUSCULAR
  Filled 2010-08-30: qty 0.5

## 2010-08-30 NOTE — Progress Notes (Signed)
Onset of contractions since last night, closer together and stronger, had mucus bloody discharge

## 2010-08-30 NOTE — Progress Notes (Signed)
39.5WKS G2P1 SVD 6-7/90/-1 INTACT GBS UNKNOWN. ORDERS GIVEN TO ADMIT TO LD.

## 2010-08-30 NOTE — H&P (Signed)
This is Dr. Lewis Moccasin dictating a admitting note on the day at [facility] patient's a 20 year old gravida 2 para 101 EDC 09/08/2010 is Rh+ allergic to iodine and His admitted in labor cervix 9 cm vertex -1 The patient is positive for MRSA and and cultures were done GBS unknown Physical exam HEENT negative l Lungs clear to P&A Heart regular rhythm no murmurs no gallops Breasts no masses Abdomen term Pelvic as described above Extremities negative

## 2010-08-31 LAB — RPR: RPR Ser Ql: NONREACTIVE

## 2010-08-31 LAB — CBC
MCH: 26.9 pg (ref 26.0–34.0)
MCHC: 34.5 g/dL (ref 30.0–36.0)
MCV: 78.1 fL (ref 78.0–100.0)
Platelets: 207 10*3/uL (ref 150–400)
RDW: 13.8 % (ref 11.5–15.5)

## 2010-08-31 NOTE — Progress Notes (Signed)
Baby able to latch without difficulty, but slips to end of nipple. Assisted mom with supporting baby at breast and positioning. Brochure from [employer]. Given to mom. Community resources for breastfeeding mothers reviewed and availability of outpatient services if needed. Encouraged mom to pre-pump to assist with latch.

## 2010-08-31 NOTE — Progress Notes (Signed)
UR chart review completed.  

## 2010-08-31 NOTE — Progress Notes (Signed)
Post Partum Day  1 Subjective: no complaints, up ad lib, voiding, tolerating PO and + flatus  Objective: Blood pressure 199/86, pulse 82, temperature 98.2 F (36.8 C), temperature source Oral, resp. rate 18, height 5\' 5"  (1.651 m), weight 104.146 kg (229 lb 9.6 oz), SpO2 99.00%, unknown if currently breastfeeding.  Physical Exam:  General: alert and no distress Lochia: appropriate Uterine Fundus: firm Incision: healing well DVT Evaluation: No evidence of DVT seen on physical exam.   Basename 08/31/10 0510 08/30/10 1402  HGB 9.7* 10.4*  HCT 28.1* 30.5*    Assessment/Plan: Plan for discharge tomorrow   LOS: 1 day   HARPER,CHARLES A 08/31/2010, 8:36 AM

## 2010-09-01 MED ORDER — IBUPROFEN 600 MG PO TABS: 600.0000 mg | ORAL_TABLET | Freq: Four times a day (QID) | ORAL | Status: AC

## 2010-09-01 NOTE — Discharge Summary (Signed)
Obstetric Discharge Summary Reason for Admission: onset of labor Prenatal Procedures: ultrasound Intrapartum Procedures: spontaneous vaginal delivery Postpartum Procedures: none Complications-Operative and Postpartum: none  Hemoglobin  Date Value Range Status  08/31/2010 9.7* 12.0-15.0 (g/dL) Final     HCT  Date Value Range Status  08/31/2010 28.1* 36.0-46.0 (%) Final    Discharge Diagnoses: Term Pregnancy-delivered  Discharge Information: Date: 09/01/2010 Activity: unrestricted Diet: routine Medications: PNV and Ibuprophen Condition: stable Instructions: refer to practice specific booklet Discharge to: home Follow-up Information    Follow up with Roseanna Rainbow, MD. Make an appointment in 6 weeks.   Contact information:   [address] [place] [address] [phone]          Newborn Data: Live born  Information for the patient's newborn:  Keagan, Anthis [[id number]]  female ; APGAR , ; weight ;  Home with mother.  Tacari Repass A 09/01/2010, 8:26 AM

## 2010-09-01 NOTE — Progress Notes (Signed)
Post Partum Day 2 Subjective: no complaints, up ad lib, voiding and tolerating PO  Objective: Blood pressure 125/79, pulse 72, temperature 98.5 F (36.9 C), temperature source Oral, resp. rate 20, height 5\' 5"  (1.651 m), weight 104.146 kg (229 lb 9.6 oz), SpO2 99.00%, unknown if currently breastfeeding.  Physical Exam:  General: alert and no distress Lochia: appropriate Uterine Fundus: firm Incision: healing well DVT Evaluation: No evidence of DVT seen on physical exam.   Basename 08/31/10 0510 08/30/10 1402  HGB 9.7* 10.4*  HCT 28.1* 30.5*    Assessment/Plan: Discharge home   LOS: 2 days   HARPER,CHARLES A 09/01/2010, 8:24 AM

## 2010-11-05 LAB — URINALYSIS, ROUTINE W REFLEX MICROSCOPIC
Bilirubin Urine: NEGATIVE
Hgb urine dipstick: NEGATIVE
Ketones, ur: 15 — AB
Nitrite: NEGATIVE
Urobilinogen, UA: 1

## 2011-02-16 NOTE — L&D Delivery Note (Signed)
Attestation of Attending Supervision of Advanced Practitioner (CNM/NP): Evaluation and management procedures were performed by the Advanced Practitioner under my supervision and collaboration.  I have reviewed the Advanced Practitioner's note and chart, and I agree with the management and plan.  UGONNA  ANYANWU, MD, FACOG Attending Obstetrician & Gynecologist Faculty Practice, [facility]  

## 2011-02-16 NOTE — L&D Delivery Note (Signed)
Delivery Note At 1:47 PM a viable female was delivered via  (Presentation: vertex; OA).  APGAR: , ; weight 6 lb 13.5 oz (3105 g).   Placenta status: intact.  Cord: 3 vessel with the following complications: post-partum hemorrhage of requiring cytotec , resolving after cytotec placement.  Hemorrhage was due to uterine atony, which gradually improved in minutes after massage and cytotec placement.  Cord pH: none  Anesthesia: IV fentanyl Episiotomy: none Lacerations: none Suture Repair: none Est. Blood Loss (mL):   Mom to postpartum.  Baby to nursery-stable.  Sonia Side 12/07/2011, 2:08 PM  I was present for the delivery and agree with the above note with the addition of I&O cath QS, fundus firm U/3. Will hold Magnesium Sulfate x 30 minutes and CTO bleeding closely. CBC in am.  Dorathy Kinsman, CNM 12/07/2011 2:22 PM

## 2011-04-05 ENCOUNTER — Inpatient Hospital Stay (HOSPITAL_COMMUNITY)
Admission: AD | Admit: 2011-04-05 | Discharge: 2011-04-05 | Disposition: A | Discharge status: Home / Self Care | Payer: Medicaid Other | Source: Ambulatory Visit | Attending: Obstetrics | Admitting: Obstetrics

## 2011-04-05 ENCOUNTER — Encounter (HOSPITAL_COMMUNITY): Payer: Self-pay

## 2011-04-05 DIAGNOSIS — A499 Bacterial infection, unspecified: Secondary | ICD-10-CM | POA: Insufficient documentation

## 2011-04-05 DIAGNOSIS — B9689 Other specified bacterial agents as the cause of diseases classified elsewhere: Secondary | ICD-10-CM | POA: Insufficient documentation

## 2011-04-05 DIAGNOSIS — N760 Acute vaginitis: Secondary | ICD-10-CM | POA: Insufficient documentation

## 2011-04-05 DIAGNOSIS — O2390 Unspecified genitourinary tract infection in pregnancy, unspecified trimester: Secondary | ICD-10-CM | POA: Insufficient documentation

## 2011-04-05 DIAGNOSIS — Z3480 Encounter for supervision of other normal pregnancy, unspecified trimester: Secondary | ICD-10-CM

## 2011-04-05 LAB — URINALYSIS, ROUTINE W REFLEX MICROSCOPIC
Bilirubin Urine: NEGATIVE
Ketones, ur: NEGATIVE mg/dL
Nitrite: NEGATIVE
Urobilinogen, UA: 0.2 mg/dL (ref 0.0–1.0)

## 2011-04-05 LAB — POCT PREGNANCY, URINE: Preg Test, Ur: POSITIVE — AB

## 2011-04-05 LAB — WET PREP, GENITAL

## 2011-04-05 MED ORDER — METRONIDAZOLE 500 MG PO TABS: 500.0000 mg | ORAL_TABLET | Freq: Two times a day (BID) | ORAL | Status: AC

## 2011-04-05 MED ORDER — METRONIDAZOLE 500 MG PO TABS: 500.0000 mg | ORAL_TABLET | Freq: Two times a day (BID) | ORAL | Status: DC

## 2011-04-05 NOTE — Discharge Instructions (Signed)
Facts about Radiation Radiation is a form of energy that is present all around [place]. Different types of radiation exist, some having more energy than others. Amounts of radiation released into the environment are measured in units called curies. However, the dose of radiation that a person receives is measured in units called rem.  People are exposed to small amounts of radiation every day, both from naturally occurring sources (such as elements in the soil or cosmic rays from the sun), and man-made sources. Man-made sources include some electronic equipment (such as microwave ovens and television sets), medical sources (such as x-rays, certain diagnostic tests, and treatments), and from nuclear weapons testing.  The amount of radiation from natural or man-made sources to which people are exposed is usually small. A radiation emergency (such as a nuclear power plant accident or a terrorist event) could expose people to small or large doses of radiation, depending on the situation.  About 80% of human exposure comes from natural sources and the remaining 20% comes from man-made radiation sources - mainly medical x-rays. Internal exposure refers to radioactive material that is taken into the body through breathing, eating, or drinking. External exposure refers to an exposure to a radioactive source outside of our bodies. Contamination refers to particles of radioactive material that are deposited anywhere they are not supposed to be, such as on an object or on a person's skin.  HOW RADIATION AFFECTS YOUR BODY Radiation can affect the body in a number of ways, and the adverse health effects of exposure may not be apparent for many years. These adverse health effects can range from mild effects, such as skin reddening, to serious effects such as cancer and death. The effects will depend on the amount of radiation absorbed by the body (the dose), the type of radiation, the route of exposure, and the length of time a  person was exposed. Exposure to very large doses of radiation may cause death within a few days or months. Exposure to lower doses of radiation may lead to an increased risk of developing cancer or other adverse health effects later in life.  TERRORISM AND RADIATION  Possible terrorist events could involve introducing radioactive material into the food or water supply, using explosives (like dynamite) to scatter radioactive materials (called a "dirty bomb"), bombing or destroying a nuclear facility, or exploding a small nuclear device.   Although introducing radioactive material into the food or water supply most likely would cause great concern or fear, it probably would not cause much contamination or increase the danger of adverse health effects. Although a dirty bomb could cause serious injuries from the explosion, it most likely would not have enough radioactive material in a form that would cause serious radiation sickness among large numbers of people. However, people who were exposed to radiation scattered by the bomb could have a greater risk of developing cancer later in life, depending on their dose.   A meltdown or explosion at a nuclear facility could cause a large amount of radioactive material to be released. People at the facility would probably be contaminated with radioactive material and possibly be injured if there was an Psychologist, educational. Those people who received a large dose might develop acute (sudden) radiation syndrome. People in the surrounding area could be exposed or contaminated. Clearly, an exploded nuclear device could result in a lot of property damage. People would be killed or injured from the blast and might be contaminated by radioactive material. Many people could have symptoms (problems)  of acute radiation syndrome. After a nuclear explosion, radioactive fallout would extend over a large region far from the point of impact, potentially increasing people's risk of developing  cancer over time.  PREPARATIONS FOR A RADIATION EMERGENCY   Your community should have a plan in place in case of a radiation emergency. Check with community leaders to learn more about the plan and possible evacuation routes.   Check with your child's school, the nursing home of a family member, and your employer to see what their plans are for dealing with a radiation emergency.  DEVELOP YOUR OWN FAMILY EMERGENCY PLAN SO THAT EVERY FAMILY MEMBER KNOWS WHAT TO DO At home, put together an emergency kit that would be appropriate for any emergency. The kit should include the following items:   A flashlight with extra batteries   A portable radio with extra batteries   Bottled water   Canned and packaged food   A hand-operated can opener   A first-aid kit and essential prescription medications   Personal items such as paper towels, garbage bags, and toilet paper  HOW TO PROTECT YOURSELF DURING A RADIOLOGIC EMERGENCY   After a release of radioactive materials, local authorities will monitor the levels of radiation and determine what protective actions to take.   The most appropriate action will depend on the situation. Tune to the local emergency response network or news station for information and instructions during any emergency.   If a radiation emergency involves the release of large amounts of radioactive materials, you may be advised to "shelter in place," which means to stay in your home or office; or you may be advised to move to another location.  IF YOU ARE ADVISED TO SHELTER IN PLACE, YOU SHOULD DO THE FOLLOWING:   Close and lock all doors and windows.   Turn off fans, air conditioners, and forced-air heating units that bring in fresh air from the outside. Only use units to recirculate air that is already in the building.   Close fireplace dampers.   If possible, bring pets inside.   Move to an inner room or basement.   Keep your radio tuned to the emergency response  network or local news to find out what else you need to do.  IF YOU ARE ADVISED TO EVACUATE:   Follow the directions that your local officials provide.   Leave the area as quickly and orderly as possible.   Take a flashlight, portable radio, batteries, first-aid kit, supply of sealed food and water, hand-operated can opener, essential medicines, and cash and credit cards.   Take pets only if you are using your own vehicle and going to a place you know will accept animals. Emergency vehicles and shelters usually will not accept animals.  SHOULD I TAKE POTASSIUM IODIDE DURING A RADIATION EMERGENCY?   Potassium iodide (KI) should only be taken in a radiation emergency that involves the release of radioactive iodine, such as an accident at a nuclear power plant or the explosion of a nuclear bomb. A "dirty bomb" most likely will not contain radioactive iodine.   A person who is internally exposed to radioactive iodine may experience thyroid disease later in life. The thyroid gland will absorb radioactive iodine and may develop cancer or abnormal growths later on. KI will saturate the thyroid gland with iodine, decreasing the amount of harmful radioactive iodine that can be absorbed.   KI only protects the thyroid gland and does not provide protection from any other radiation exposure.  Some people are allergic to iodine and should not take KI. Check with your doctor about any concerns you have about potassium iodide.  Most of this information is courtesy of [place] Government CDC. In times of an emergency, much of this material may not apply when it comes to specialized care and testing. These are guidelines to help you when that care is not available. Some of this information is very technical and difficult to understand, but hopefully someone will be available for help in treatment and understanding. Document Released: 04/24/2002 Document Revised: 10/14/2010 Document Reviewed: 02/01/2005 [facility]  Patient Information 2012 [place], [place].Bacterial Vaginosis Bacterial vaginosis (BV) is a vaginal infection where the normal balance of bacteria in the vagina is disrupted. The normal balance is then replaced by an overgrowth of certain bacteria. There are several different kinds of bacteria that can cause BV. BV is the most common vaginal infection in women of childbearing age. CAUSES   The cause of BV is not fully understood. BV develops when there is an increase or imbalance of harmful bacteria.   Some activities or behaviors can upset the normal balance of bacteria in the vagina and put women at increased risk including:   Having a new sex partner or multiple sex partners.   Douching.   Using an intrauterine device (IUD) for contraception.   It is not clear what role sexual activity plays in the development of BV. However, women that have never had sexual intercourse are rarely infected with BV.  Women do not get BV from toilet seats, bedding, swimming pools or from touching objects around them.  SYMPTOMS   Grey vaginal discharge.   A fish-like odor with discharge, especially after sexual intercourse.   Itching or burning of the vagina and vulva.   Burning or pain with urination.   Some women have no signs or symptoms at all.  DIAGNOSIS  Your caregiver must examine the vagina for signs of BV. Your caregiver will perform lab tests and look at the sample of vaginal fluid through a microscope. They will look for bacteria and abnormal cells (clue cells), a pH test higher than 4.5, and a positive amine test all associated with BV.  RISKS AND COMPLICATIONS   Pelvic inflammatory disease (PID).   Infections following gynecology surgery.   Developing HIV.   Developing herpes virus.  TREATMENT  Sometimes BV will clear up without treatment. However, all women with symptoms of BV should be treated to avoid complications, especially if gynecology surgery is planned. Female partners  generally do not need to be treated. However, BV may spread between female sex partners so treatment is helpful in preventing a recurrence of BV.   BV may be treated with antibiotics. The antibiotics come in either pill or vaginal cream forms. Either can be used with nonpregnant or pregnant women, but the recommended dosages differ. These antibiotics are not harmful to the baby.   BV can recur after treatment. If this happens, a second round of antibiotics will often be prescribed.   Treatment is important for pregnant women. If not treated, BV can cause a premature delivery, especially for a pregnant woman who had a premature birth in the past. All pregnant women who have symptoms of BV should be checked and treated.   For chronic reoccurrence of BV, treatment with a type of prescribed gel vaginally twice a week is helpful.  HOME CARE INSTRUCTIONS   Finish all medication as directed by your caregiver.   Do not have sex  until treatment is completed.   Tell your sexual partner that you have a vaginal infection. They should see their caregiver and be treated if they have problems, such as a mild rash or itching.   Practice safe sex. Use condoms. Only have 1 sex partner.  PREVENTION  Basic prevention steps can help reduce the risk of upsetting the natural balance of bacteria in the vagina and developing BV:  Do not have sexual intercourse (be abstinent).   Do not douche.   Use all of the medicine prescribed for treatment of BV, even if the signs and symptoms go away.   Tell your sex partner if you have BV. That way, they can be treated, if needed, to prevent reoccurrence.  SEEK MEDICAL CARE IF:   Your symptoms are not improving after 3 days of treatment.   You have increased discharge, pain, or fever.  MAKE SURE YOU:   Understand these instructions.   Will watch your condition.   Will get help right away if you are not doing well or get worse.  FOR MORE INFORMATION  Division of  STD Prevention (DSTDP), Centers for Disease Control and Prevention: [employer] American Social Health Association (ASHA): www.ashastd.org  Document Released: 02/01/2005 Document Revised: 10/14/2010 Document Reviewed: 07/25/2008 [facility] Patient Information 2012 [place], [place].ABCs of Pregnancy A Antepartum care is very important. Be sure you see your doctor and get prenatal care as soon as you think you are pregnant. At this time, you will be tested for infection, genetic abnormalities and potential problems with you and the pregnancy. This is the time to discuss diet, exercise, work, medications, labor, pain medication during labor and the possibility of a cesarean delivery. Ask any questions that may concern you. It is important to see your doctor regularly throughout your pregnancy. Avoid exposure to toxic substances and chemicals - such as cleaning solvents, lead and mercury, some insecticides, and paint. Pregnant women should avoid exposure to paint fumes, and fumes that cause you to feel ill, dizzy or faint. When possible, it is a good idea to have a pre-pregnancy consultation with your caregiver to begin some important recommendations your caregiver suggests such as, taking folic acid, exercising, quitting smoking, avoiding alcoholic beverages, etc. B Breastfeeding is the healthiest choice for both you and your baby. It has many nutritional benefits for the baby and health benefits for the mother. It also creates a very tight and loving bond between the baby and mother. Talk to your doctor, your family and friends, and your employer about how you choose to feed your baby and how they can support you in your decision. Not all birth defects can be prevented, but a woman can take actions that may increase her chance of having a healthy baby. Many birth defects happen very early in pregnancy, sometimes before a woman even knows she is pregnant. Birth defects or abnormalities of any child in your or  the father's family should be discussed with your caregiver. Get a good support bra as your breast size changes. Wear it especially when you exercise and when nursing.  C Celebrate the news of your pregnancy with the your spouse/father and family. Childbirth classes are helpful to take for you and the spouse/father because it helps to understand what happens during the pregnancy, labor and delivery. Cesarean delivery should be discussed with your doctor so you are prepared for that possibility. The pros and cons of circumcision if it is a boy, should be discussed with your pediatrician. Cigarette smoking during  pregnancy can result in low birth weight babies. It has been associated with infertility, miscarriages, tubal pregnancies, infant death (mortality) and poor health (morbidity) in childhood. Additionally, cigarette smoking may cause long-term learning disabilities. If you smoke, you should try to quit before getting pregnant and not smoke during the pregnancy. Secondary smoke may also harm a mother and her developing baby. It is a good idea to ask people to stop smoking around you during your pregnancy and after the baby is born. Extra calcium is necessary when you are pregnant and is found in your prenatal vitamin, in dairy products, green leafy vegetables and in calcium supplements. D A healthy diet according to your current weight and height, along with vitamins and mineral supplements should be discussed with your caregiver. Domestic abuse or violence should be made known to your doctor right away to get the situation corrected. Drink more water when you exercise to keep hydrated. Discomfort of your back and legs usually develops and progresses from the middle of the second trimester through to delivery of the baby. This is because of the enlarging baby and uterus, which may also affect your balance. Do not take illegal drugs. Illegal drugs can seriously harm the baby and you. Drink extra fluids (water  is best) throughout pregnancy to help your body keep up with the increases in your blood volume. Drink at least 6 to 8 glasses of water, fruit juice, or milk each day. A good way to know you are drinking enough fluid is when your urine looks almost like clear water or is very light yellow.  E Eat healthy to get the nutrients you and your unborn baby need. Your meals should include the five basic food groups. Exercise (30 minutes of light to moderate exercise a day) is important and encouraged during pregnancy, if there are no medical problems or problems with the pregnancy. Exercise that causes discomfort or dizziness should be stopped and reported to your caregiver. Emotions during pregnancy can change from being ecstatic to depression and should be understood by you, your partner and your family. F Fetal screening with ultrasound, amniocentesis and monitoring during pregnancy and labor is common and sometimes necessary. Take 400 micrograms of folic acid daily both before, when possible, and during the first few months of pregnancy to reduce the risk of birth defects of the brain and spine. All women who could possibly become pregnant should take a vitamin with folic acid, every day. It is also important to eat a healthy diet with fortified foods (enriched grain products, including cereals, rice, breads, and pastas) and foods with natural sources of folate (orange juice, green leafy vegetables, beans, peanuts, broccoli, asparagus, peas, and lentils). The father should be involved with all aspects of the pregnancy including, the prenatal care, childbirth classes, labor, delivery, and postpartum time. Fathers may also have emotional concerns about being a father, financial needs, and raising a family. G Genetic testing should be done appropriately. It is important to know your family and the father's history. If there have been problems with pregnancies or birth defects in your family, report these to your  doctor. Also, genetic counselors can talk with you about the information you might need in making decisions about having a family. You can call a major medical center in your area for help in finding a board-certified genetic counselor. Genetic testing and counseling should be done before pregnancy when possible, especially if there is a history of problems in the mother's or father's family. Certain  ethnic backgrounds are more at risk for genetic defects. H Get familiar with the hospital where you will be having your baby. Get to know how long it takes to get there, the labor and delivery area, and the hospital procedures. Be sure your medical insurance is accepted there. Get your home ready for the baby including, clothes, the baby's room (when possible), furniture and car seat. Hand washing is important throughout the day, especially after handling raw meat and poultry, changing the baby's diaper or using the bathroom. This can help prevent the spread of many bacteria and viruses that cause infection. Your hair may become dry and thinner, but will return to normal a few weeks after the baby is born. Heartburn is a common problem that can be treated by taking antacids recommended by your caregiver, eating smaller meals 5 or 6 times a day, not drinking liquids when eating, drinking between meals and raising the head of your bed 2 to 3 inches. I Insurance to cover you, the baby, doctor and hospital should be reviewed so that you will be prepared to pay any costs not covered by your insurance plan. If you do not have medical insurance, there are usually clinics and services available for you in your community. Take 30 milligrams of iron during your pregnancy as prescribed by your doctor to reduce the risk of low red blood cells (anemia) later in pregnancy. All women of childbearing age should eat a diet rich in iron. J There should be a joint effort for the mother, father and any other children to adapt to the  pregnancy financially, emotionally, and psychologically during the pregnancy. Join a support group for moms-to-be. Or, join a class on parenting or childbirth. Have the family participate when possible. K Know your limits. Let your caregiver know if you experience any of the following:   Pain of any kind.   Strong cramps.   You develop a lot of weight in a short period of time (5 pounds in 3 to 5 days).   Vaginal bleeding, leaking of amniotic fluid.   Headache, vision problems.   Dizziness, fainting, shortness of breath.   Chest pain.   Fever of 102 F (38.9 C) or higher.   Gush of clear fluid from your vagina.   Painful urination.   Domestic violence.   Irregular heartbeat (palpitations).   Rapid beating of the heart (tachycardia).   Constant feeling sick to your stomach (nauseous) and vomiting.   Trouble walking, fluid retention (edema).   Muscle weakness.   If your baby has decreased activity.   Persistent diarrhea.   Abnormal vaginal discharge.   Uterine contractions at 20-minute intervals.   Back pain that travels down your leg.  L Learn and practice that what you eat and drink should be in moderation and healthy for you and your baby. Legal drugs such as alcohol and caffeine are important issues for pregnant women. There is no safe amount of alcohol a woman can drink while pregnant. Fetal alcohol syndrome, a disorder characterized by growth retardation, facial abnormalities, and central nervous system dysfunction, is caused by a woman's use of alcohol during pregnancy. Caffeine, found in tea, coffee, soft drinks and chocolate, should also be limited. Be sure to read labels when trying to cut down on caffeine during pregnancy. More than 200 foods, beverages, and over-the-counter medications contain caffeine and have a high salt content! There are coffees and teas that do not contain caffeine. M Medical conditions such as diabetes, epilepsy,  and high blood  pressure should be treated and kept under control before pregnancy when possible, but especially during pregnancy. Ask your caregiver about any medications that may need to be changed or adjusted during pregnancy. If you are currently taking any medications, ask your caregiver if it is safe to take them while you are pregnant or before getting pregnant when possible. Also, be sure to discuss any herbs or vitamins you are taking. They are medicines, too! Discuss with your doctor all medications, prescribed and over-the-counter, that you are taking. During your prenatal visit, discuss the medications your doctor may give you during labor and delivery. N Never be afraid to ask your doctor or caregiver questions about your health, the progress of the pregnancy, family problems, stressful situations, and recommendation for a pediatrician, if you do not have one. It is better to take all precautions and discuss any questions or concerns you may have during your office visits. It is a good idea to write down your questions before you visit the doctor. O Over-the-counter cough and cold remedies may contain alcohol or other ingredients that should be avoided during pregnancy. Ask your caregiver about prescription, herbs or over-the-counter medications that you are taking or may consider taking while pregnant.  P Physical activity during pregnancy can benefit both you and your baby by lessening discomfort and fatigue, providing a sense of well-being, and increasing the likelihood of early recovery after delivery. Light to moderate exercise during pregnancy strengthens the belly (abdominal) and back muscles. This helps improve posture. Practicing yoga, walking, swimming, and cycling on a stationary bicycle are usually safe exercises for pregnant women. Avoid scuba diving, exercise at high altitudes (over 3000 feet), skiing, horseback riding, contact sports, etc. Always check with your doctor before beginning any kind of  exercise, especially during pregnancy and especially if you did not exercise before getting pregnant. Q Queasiness, stomach upset and morning sickness are common during pregnancy. Eating a couple of crackers or dry toast before getting out of bed. Foods that you normally love may make you feel sick to your stomach. You may need to substitute other nutritious foods. Eating 5 or 6 small meals a day instead of 3 large ones may make you feel better. Do not drink with your meals, drink between meals. Questions that you have should be written down and asked during your prenatal visits. R Read about and make plans to baby-proof your home. There are important tips for making your home a safer environment for your baby. Review the tips and make your home safer for you and your baby. Read food labels regarding calories, salt and fat content in the food. S Saunas, hot tubs, and steam rooms should be avoided while you are pregnant. Excessive high heat may be harmful during your pregnancy. Your caregiver will screen and examine you for sexually transmitted diseases and genetic disorders during your prenatal visits. Learn the signs of labor. Sexual relations while pregnant is safe unless there is a medical or pregnancy problem and your caregiver advises against it. T Traveling long distances should be avoided especially in the third trimester of your pregnancy. If you do have to travel out of state, be sure to take a copy of your medical records and medical insurance plan with you. You should not travel long distances without seeing your doctor first. Most airlines will not allow you to travel after 36 weeks of pregnancy. Toxoplasmosis is an infection caused by a parasite that can seriously harm an unborn  baby. Avoid eating undercooked meat and handling cat litter. Be sure to wear gloves when gardening. Tingling of the hands and fingers is not unusual and is due to fluid retention. This will go away after the baby is  born. U Womb (uterus) size increases during the first trimester. Your kidneys will begin to function more efficiently. This may cause you to feel the need to urinate more often. You may also leak urine when sneezing, coughing or laughing. This is due to the growing uterus pressing against your bladder, which lies directly in front of and slightly under the uterus during the first few months of pregnancy. If you experience burning along with frequency of urination or bloody urine, be sure to tell your doctor. The size of your uterus in the third trimester may cause a problem with your balance. It is advisable to maintain good posture and avoid wearing high heels during this time. An ultrasound of your baby may be necessary during your pregnancy and is safe for you and your baby. V Vaccinations are an important concern for pregnant women. Get needed vaccines before pregnancy. Center for Disease Control ([employer]) has clear guidelines for the use of vaccines during pregnancy. Review the list, be sure to discuss it with your doctor. Prenatal vitamins are helpful and healthy for you and the baby. Do not take extra vitamins except what is recommended. Taking too much of certain vitamins can cause overdose problems. Continuous vomiting should be reported to your caregiver. Varicose veins may appear especially if there is a family history of varicose veins. They should subside after the delivery of the baby. Support hose helps if there is leg discomfort. W Being overweight or underweight during pregnancy may cause problems. Try to get within 15 pounds of your ideal weight before pregnancy. Remember, pregnancy is not a time to be dieting! Do not stop eating or start skipping meals as your weight increases. Both you and your baby need the calories and nutrition you receive from a healthy diet. Be sure to consult with your doctor about your diet. There is a formula and diet plan available depending on whether you are  overweight or underweight. Your caregiver or nutritionist can help and advise you if necessary. X Avoid X-rays. If you must have dental work or diagnostic tests, tell your dentist or physician that you are pregnant so that extra care can be taken. X-rays should only be taken when the risks of not taking them outweigh the risk of taking them. If needed, only the minimum amount of radiation should be used. When X-rays are necessary, protective lead shields should be used to cover areas of the body that are not being X-rayed. Y Your baby loves you. Breastfeeding your baby creates a loving and very close bond between the two of you. Give your baby a healthy environment to live in while you are pregnant. Infants and children require constant care and guidance. Their health and safety should be carefully watched at all times. After the baby is born, rest or take a nap when the baby is sleeping. Z Get your ZZZs. Be sure to get plenty of rest. Resting on your side as often as possible, especially on your left side is advised. It provides the best circulation to your baby and helps reduce swelling. Try taking a nap for 30 to 45 minutes in the afternoon when possible. After the baby is born rest or take a nap when the baby is sleeping. Try elevating your feet for  that amount of time when possible. It helps the circulation in your legs and helps reduce swelling.  Most information courtesy of the CDC. Document Released: 02/01/2005 Document Revised: 10/14/2010 Document Reviewed: 10/16/2008 [facility] Patient Information 2012 [place], [place].

## 2011-04-05 NOTE — Progress Notes (Signed)
Took pregnancy test at home, it was positive, came here to get  'for sure, for sure'

## 2011-04-05 NOTE — ED Provider Notes (Signed)
History   Brittany [name] is a 21 y.o. year old G20P2002 female at [redacted]w[redacted]d weeks gestation by uncertain LMP who presents to MAU reporting pos home UPT, possible BV and wanting  Verification of pregnancy. She denies VB or Abd pain.   CSN: [id number]  Arrival date & time 04/05/11  1307   None     No chief complaint on file.   (Consider location/radiation/quality/duration/timing/severity/associated sxs/prior treatment) HPI  Past Medical History  Diagnosis Date  . Asthma   . NVD (normal vaginal delivery) 08/30/2010    Past Surgical History  Procedure Date  . No past surgeries     Family History  Problem Relation Age of Onset  . Anesthesia problems Neg Hx     History  Substance Use Topics  . Smoking status: Never Smoker   . Smokeless tobacco: Not on file  . Alcohol Use: No    OB History    Grav Para Term Preterm Abortions TAB SAB Ect Mult Living   3 2 2       2       Review of Systems: Otherwise neg  Allergies  Iodine and Sulfonamide derivatives  Home Medications  No current outpatient prescriptions on file.  BP 110/65  Pulse 93  Temp(Src) 97.8 F (36.6 C) (Oral)  Resp 20  Ht 5\' 4"  (1.626 m)  Wt 88.905 kg (196 lb)  BMI 33.64 kg/m2  LMP 02/09/2011  Physical Exam  Constitutional: She appears well-developed and well-nourished. No distress.  Cardiovascular: Normal rate.   Pulmonary/Chest: Effort normal.  Abdominal: Soft. There is no tenderness.  Genitourinary: Uterus is not enlarged and not tender. Cervix exhibits no motion tenderness, no discharge and no friability. Right adnexum displays no mass, no tenderness and no fullness. Left adnexum displays no mass, no tenderness and no fullness. No bleeding around the vagina. Vaginal discharge (small amount of malodorous discharge) found.    ED Course  Procedures (including critical care time)  Labs Reviewed  URINALYSIS, ROUTINE W REFLEX MICROSCOPIC - Abnormal; Notable for the following:    APPearance HAZY  (*)    All other components within normal limits  WET PREP, GENITAL - Abnormal; Notable for the following:    Clue Cells Wet Prep HPF POC FEW (*)    WBC, Wet Prep HPF POC MODERATE (*) MANY BACTERIA SEEN   All other components within normal limits  POCT PREGNANCY, URINE - Abnormal; Notable for the following:    Preg Test, Ur POSITIVE (*)    All other components within normal limits  LAB REPORT - SCANNED   No results found.   1. BV (bacterial vaginosis)   2. Normal pregnancy, repeat     MDM  Rx Flagyl D/C home F/U as scheduled w/ Dr. Clearance Coots for next appt and dating [place].  Dorathy Kinsman 04/05/2011 2:52 PM

## 2011-04-20 LAB — OB RESULTS CONSOLE HEPATITIS B SURFACE ANTIGEN: Hepatitis B Surface Ag: NEGATIVE

## 2011-04-22 ENCOUNTER — Other Ambulatory Visit: Payer: Self-pay | Admitting: Obstetrics

## 2011-04-22 DIAGNOSIS — O3680X0 Pregnancy with inconclusive fetal viability, not applicable or unspecified: Secondary | ICD-10-CM

## 2011-04-26 ENCOUNTER — Ambulatory Visit (HOSPITAL_COMMUNITY)
Admission: RE | Admit: 2011-04-26 | Discharge: 2011-04-26 | Disposition: A | Discharge status: Home / Self Care | Payer: Medicaid Other | Source: Ambulatory Visit | Attending: Obstetrics | Admitting: Obstetrics

## 2011-04-26 DIAGNOSIS — Z3689 Encounter for other specified antenatal screening: Secondary | ICD-10-CM | POA: Insufficient documentation

## 2011-04-26 DIAGNOSIS — O26849 Uterine size-date discrepancy, unspecified trimester: Secondary | ICD-10-CM | POA: Insufficient documentation

## 2011-04-26 DIAGNOSIS — O3680X0 Pregnancy with inconclusive fetal viability, not applicable or unspecified: Secondary | ICD-10-CM

## 2011-04-26 IMAGING — US US OB COMP LESS 14 WK
1 series · 13 of 28 positions shown · non-contrast
Comparison: none

[Series 1: us ob comp less 14 wks · 33 acquisitions, 13 frames shown]
[im 2/33]
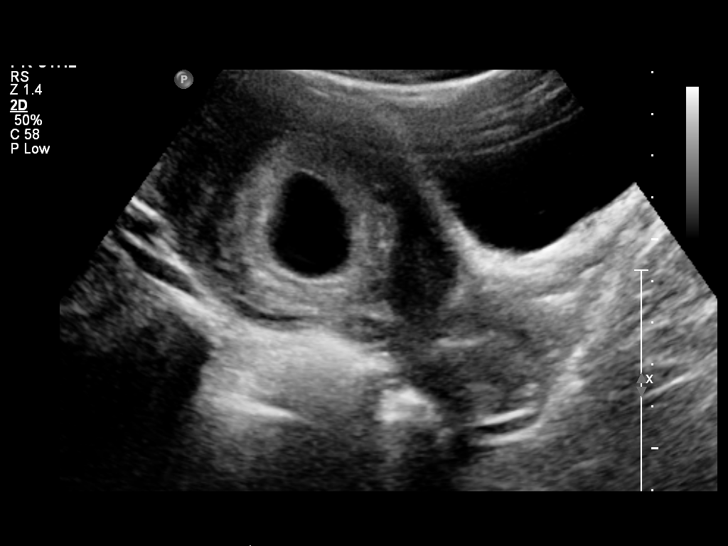
[im 4/33]
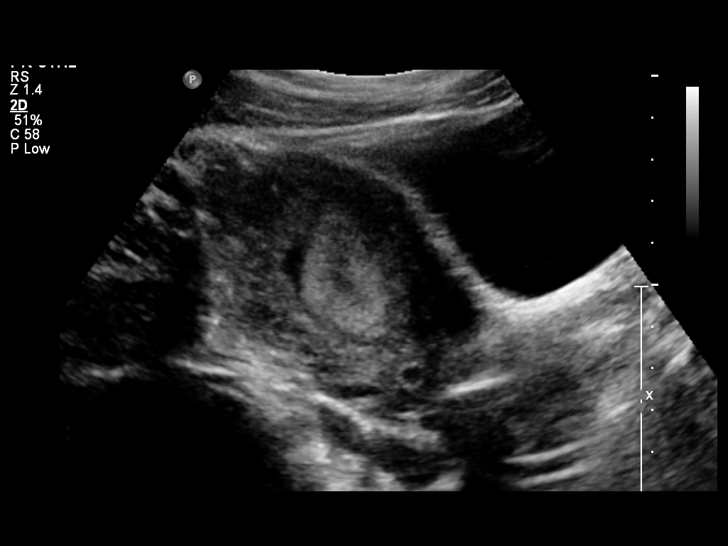
[im 6/33]
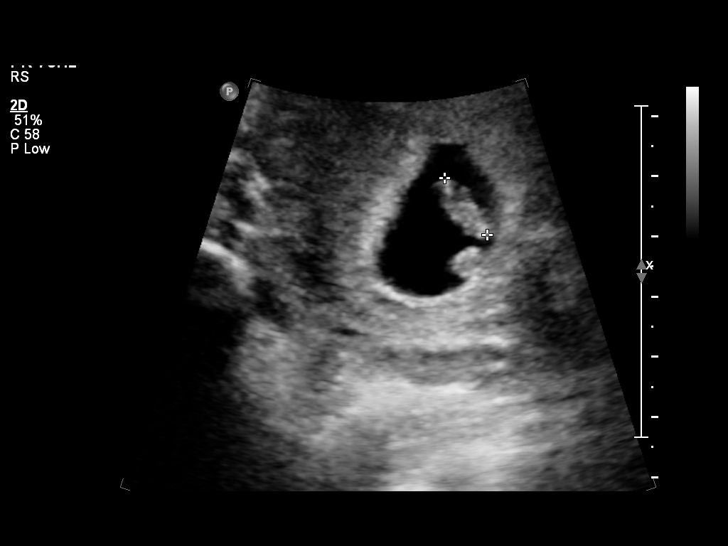
[im 9/33]
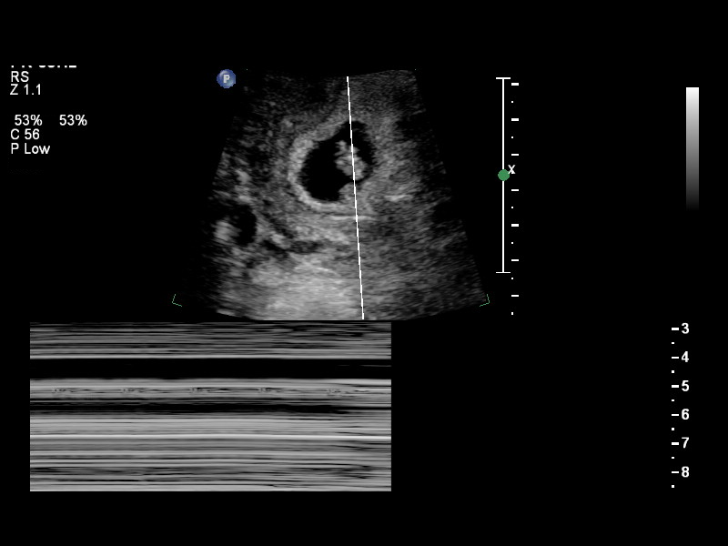
[im 11/33]
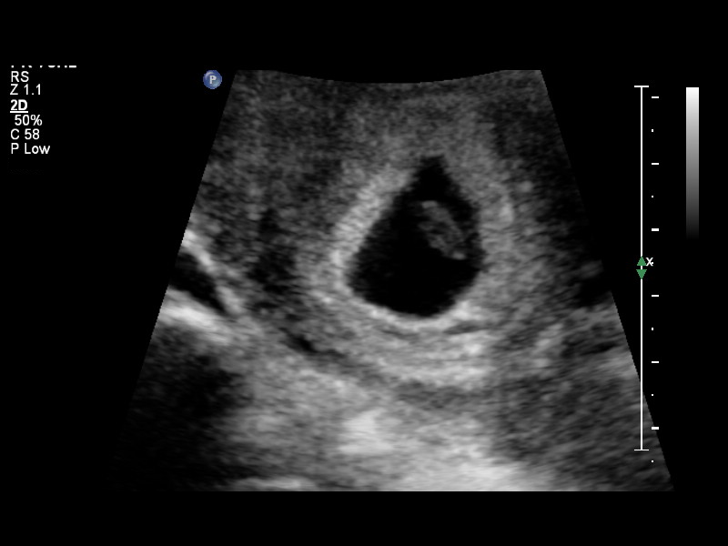
[im 14/33]
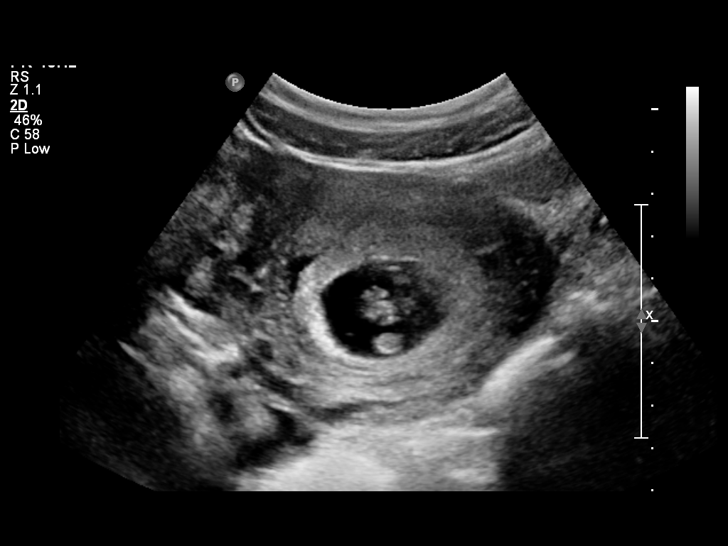
[im 17/33]
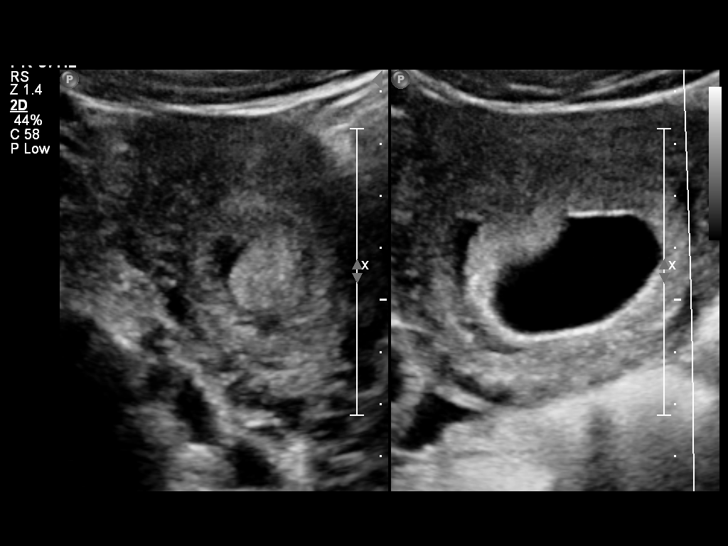
[im 19/33]
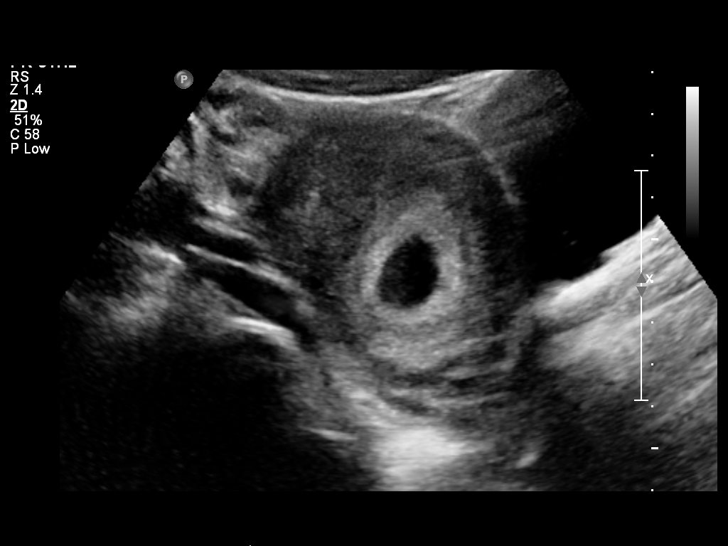
[im 22/33]
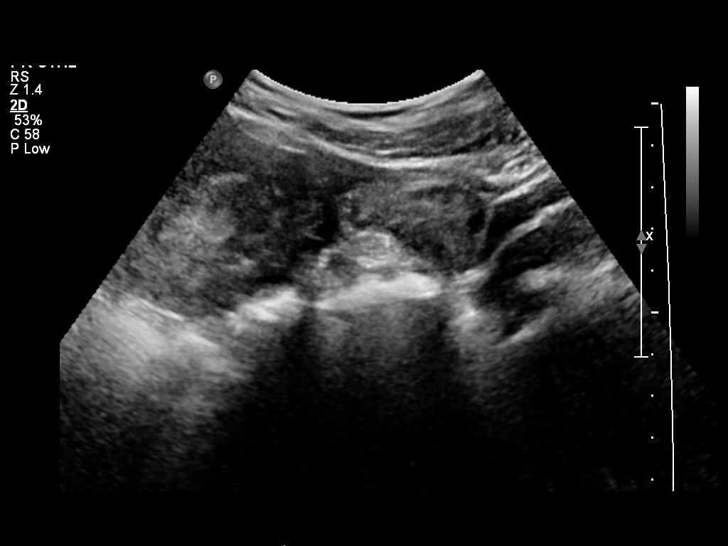
[im 24/33]
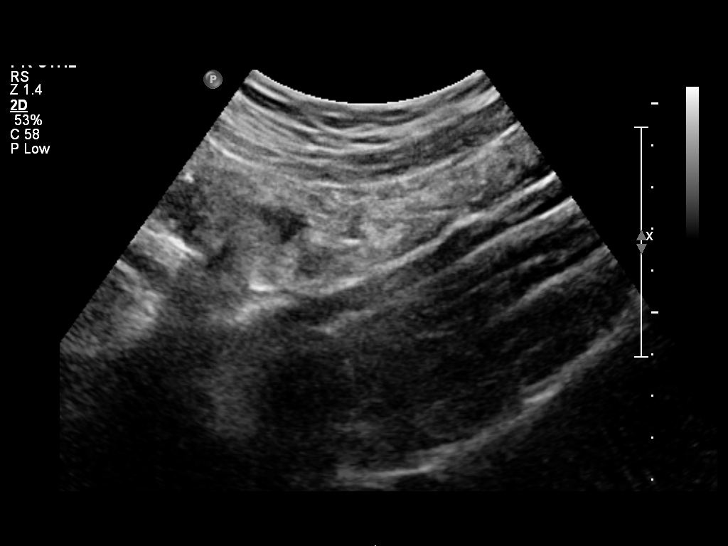
[im 27/33]
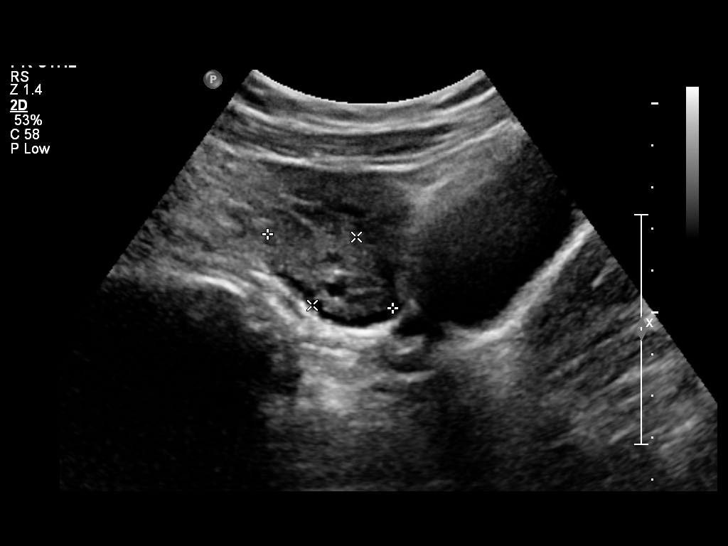
[im 29/33]
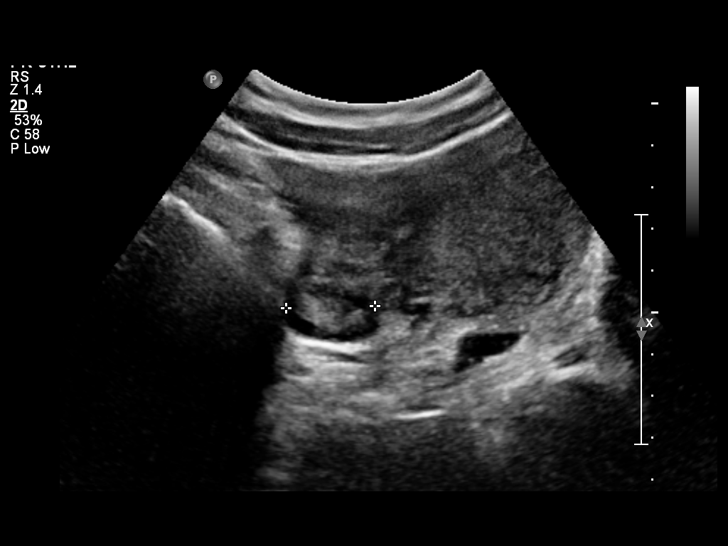
[im 31/33]
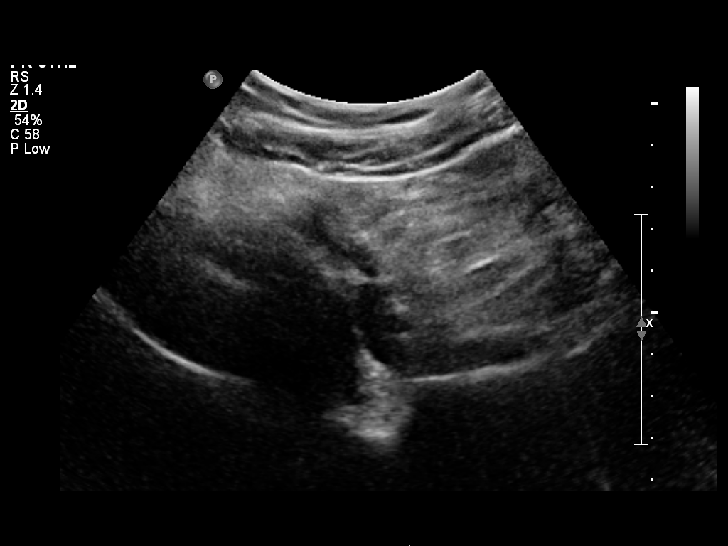

[13 of 28 positions shown; findings below may reference images not displayed]

OBSTETRICS REPORT
                      (Signed Final [DATE] [DATE])

 Order#:         [PHONE_NUMBER]_O
Procedures

 US OB COMP LESS 14 WKS                                76801.0
Indications

 Unsure of LMP;  Establish Gestational [AGE]
 Size-Date Discrepancy
Fetal Evaluation

 Preg. Location:    Intrauterine
 Gest. Sac:         Intrauterine
 Yolk Sac:          Visualized
 Fetal Pole:        Visualized
 Fetal Heart Rate:  153                         bpm
 Cardiac Activity:  Observed

 Comment:    Small subchorionic hemorrhage noted.
Biometry

 CRL:     11.9  mm    G. Age:   7w 3d                  EDD:   [DATE]
Gestational Age

 LMP:           10w 6d       Date:   [DATE]                 EDD:   [DATE]
 Best:          7w 3d     Det. By:   U/S C R L ([DATE])     EDD:   [DATE]
Cervix Uterus Adnexa

 Cervix:       Closed.
 Uterus:       Small subchorionic hemmorhage noted measuring
               x 0.5 x 0.7cm.
 Cul De Sac:   No free fluid seen.
 Left Ovary:   Within normal limits.
 Right Ovary:  Within normal limits.
 Adnexa:     No abnormality visualized.
Impression

 Single living IUP with estimated gestational age by ultrasound
 of 7w 3d, and EDD of [DATE].
 Small subchorionic hemorrhage noted.
Recommendations

 US for fetal anatomic evaluation at 18-19 wks GA.

## 2011-06-28 ENCOUNTER — Encounter (HOSPITAL_COMMUNITY): Payer: Self-pay | Admitting: *Deleted

## 2011-06-28 ENCOUNTER — Inpatient Hospital Stay (HOSPITAL_COMMUNITY)
Admission: AD | Admit: 2011-06-28 | Discharge: 2011-06-28 | Disposition: A | Discharge status: Home / Self Care | Payer: Medicaid Other | Source: Ambulatory Visit | Attending: Obstetrics | Admitting: Obstetrics

## 2011-06-28 DIAGNOSIS — O99891 Other specified diseases and conditions complicating pregnancy: Secondary | ICD-10-CM | POA: Insufficient documentation

## 2011-06-28 DIAGNOSIS — R109 Unspecified abdominal pain: Secondary | ICD-10-CM

## 2011-06-28 DIAGNOSIS — O26899 Other specified pregnancy related conditions, unspecified trimester: Secondary | ICD-10-CM

## 2011-06-28 LAB — URINALYSIS, ROUTINE W REFLEX MICROSCOPIC
Glucose, UA: NEGATIVE mg/dL
Nitrite: NEGATIVE
Protein, ur: NEGATIVE mg/dL
pH: 7 (ref 5.0–8.0)

## 2011-06-28 LAB — URINE MICROSCOPIC-ADD ON

## 2011-06-28 MED ORDER — CYCLOBENZAPRINE HCL 10 MG PO TABS: 10.0000 mg | ORAL_TABLET | Freq: Three times a day (TID) | ORAL | Status: AC | PRN

## 2011-06-28 MED ORDER — CYCLOBENZAPRINE HCL 10 MG PO TABS
10.0000 mg | ORAL_TABLET | Freq: Once | ORAL | Status: AC
  Administered 2011-06-28: 10 mg via ORAL
  Filled 2011-06-28: qty 1

## 2011-06-28 NOTE — Discharge Instructions (Signed)
Abdominal Pain During Pregnancy  Abdominal discomfort is common in pregnancy. Most of the time, it does not cause harm. There are many causes of abdominal pain. Some causes are more serious than others. Some of the causes of abdominal pain in pregnancy are easily diagnosed. Occasionally, the diagnosis takes time to understand. Other times, the cause is not determined. Abdominal pain can be a sign that something is very wrong with the pregnancy, or the pain may have nothing to do with the pregnancy at all. For this reason, always tell your caregiver if you have any abdominal discomfort.  CAUSES  Common and harmless causes of abdominal pain include:   Constipation.   Excess gas and bloating.   Round ligament pain. This is pain that is felt in the folds of the groin.   The position the baby or placenta is in.   Baby kicks.   Braxton-Hicks contractions. These are mild contractions that do not cause cervical dilation.  Serious causes of abdominal pain include:   Ectopic pregnancy. This happens when a fertilized egg implants outside of the uterus.   Miscarriage.   Preterm labor. This is when labor starts at less than 37 weeks of pregnancy.   Placental abruption. This is when the placenta partially or completely separates from the uterus.   Preeclampsia. This is often associated with high blood pressure and has been referred to as "toxemia in pregnancy."   Uterine or amniotic fluid infections.  Causes unrelated to pregnancy include:   Urinary tract infection.   Gallbladder stones or inflammation.   Hepatitis or other liver illness.   Intestinal problems, stomach flu, food poisoning, or ulcer.   Appendicitis.   Kidney (renal) stones.   Kidney infection (pylonephritis).  HOME CARE INSTRUCTIONS   For mild pain:   Do not have sexual intercourse or put anything in your vagina until your symptoms go away completely.   Get plenty of rest until your pain improves. If your pain does not improve in 1 hour, call  your caregiver.   Drink clear fluids if you feel nauseous. Avoid solid food as long as you are uncomfortable or nauseous.   Only take medicine as directed by your caregiver.   Keep all follow-up appointments with your caregiver.  SEEK IMMEDIATE MEDICAL CARE IF:   You are bleeding, leaking fluid, or passing tissue from the vagina.   You have increasing pain or cramping.   You have persistent vomiting.   You have painful or bloody urination.   You have a fever.   You notice a decrease in your baby's movements.   You have extreme weakness or feel faint.   You have shortness of breath, with or without abdominal pain.   You develop a severe headache with abdominal pain.   You have abnormal vaginal discharge with abdominal pain.   You have persistent diarrhea.   You have abdominal pain that continues even after rest, or gets worse.  MAKE SURE YOU:    Understand these instructions.   Will watch your condition.   Will get help right away if you are not doing well or get worse.  Document Released: 02/01/2005 Document Revised: 01/21/2011 Document Reviewed: 08/28/2010  [employer] Patient Information 2012 [employer].

## 2011-06-28 NOTE — MAU Note (Signed)
Pain in lower abd, cramping, at night is worse, when comes at night goes into left leg. Denies bleeding.  Thinks might have BV. Constipated, denies pain or burning with urination.

## 2011-06-28 NOTE — MAU Note (Signed)
Pt in c/o lower abdominal pain x5 days.  States past 2 days have been worse.  States pains are sharp and worse with movement.  Occasional sharp pains down left leg.  Denies any bleeding or discharge.

## 2011-06-28 NOTE — MAU Provider Note (Signed)
  History     CSN: [id number]  Arrival date and time: 06/28/11 1737   First Provider Initiated Contact with Patient 06/28/11 1840      Chief Complaint  Patient presents with  . Abdominal Pain   HPI  Pt is here with report of lower abdominal pain x 5 days.  Denies vaginal bleeding or leaking of fluid.  Pain increases at night when trying to sleep or turning in bed.  No abnormal vaginal discharge.    Past Medical History  Diagnosis Date  . Asthma   . NVD (normal vaginal delivery) 08/30/2010    Past Surgical History  Procedure Date  . No past surgeries     Family History  Problem Relation Age of Onset  . Anesthesia problems Neg Hx     History  Substance Use Topics  . Smoking status: Never Smoker   . Smokeless tobacco: Not on file  . Alcohol Use: No    Allergies:  Allergies  Allergen Reactions  . Iodine     unknown  . Sulfonamide Derivatives Other (See Comments)    Causes blisters.    Prescriptions prior to admission  Medication Sig Dispense Refill  . albuterol (VENTOLIN HFA) 108 (90 BASE) MCG/ACT inhaler        . fluticasone (FLOVENT HFA) 110 MCG/ACT inhaler Inhale 1 puff into the lungs daily.          Review of Systems  Gastrointestinal: Positive for abdominal pain.  All other systems reviewed and are negative.   Physical Exam   Blood pressure 112/70, pulse 86, temperature 98.1 F (36.7 C), temperature source Oral, resp. rate 20, height 5' 2.5" (1.588 m), weight 84.369 kg (186 lb), last menstrual period 02/09/2011.  Physical Exam  Constitutional: She is oriented to person, place, and time. She appears well-developed and well-nourished. No distress.  HENT:  Head: Normocephalic.  Neck: Normal range of motion. Neck supple.  Cardiovascular: Normal rate, regular rhythm and normal heart sounds.   Respiratory: Effort normal and breath sounds normal.  GI: Soft. There is no tenderness.  Genitourinary: No bleeding around the vagina. Vaginal discharge  (mucusy) found.       Cervix - closed, thick  Musculoskeletal: Normal range of motion.  Neurological: She is alert and oriented to person, place, and time.  Skin: Skin is warm and dry.  Doppler - FHR 150's  MAU Course  Procedures  Flexeril - pt reports relief  Assessment and Plan  Abdominal Pain in Pregnancy - normal exam  Plan: DC to home Reviewed preterm labor precautions Keep scheduled appointment  [facility] 06/28/2011, 6:42 PM

## 2011-07-09 ENCOUNTER — Inpatient Hospital Stay (HOSPITAL_COMMUNITY)
Admission: AD | Admit: 2011-07-09 | Discharge: 2011-07-10 | Disposition: A | Discharge status: Home / Self Care | Payer: Medicaid Other | Source: Ambulatory Visit | Attending: Obstetrics | Admitting: Obstetrics

## 2011-07-09 DIAGNOSIS — A499 Bacterial infection, unspecified: Secondary | ICD-10-CM | POA: Insufficient documentation

## 2011-07-09 DIAGNOSIS — R109 Unspecified abdominal pain: Secondary | ICD-10-CM

## 2011-07-09 DIAGNOSIS — O2390 Unspecified genitourinary tract infection in pregnancy, unspecified trimester: Secondary | ICD-10-CM | POA: Insufficient documentation

## 2011-07-09 DIAGNOSIS — B9689 Other specified bacterial agents as the cause of diseases classified elsewhere: Secondary | ICD-10-CM

## 2011-07-09 DIAGNOSIS — N760 Acute vaginitis: Secondary | ICD-10-CM | POA: Insufficient documentation

## 2011-07-09 DIAGNOSIS — O26899 Other specified pregnancy related conditions, unspecified trimester: Secondary | ICD-10-CM

## 2011-07-10 ENCOUNTER — Encounter (HOSPITAL_COMMUNITY): Payer: Self-pay | Admitting: *Deleted

## 2011-07-10 LAB — URINALYSIS, ROUTINE W REFLEX MICROSCOPIC
Bilirubin Urine: NEGATIVE
Hgb urine dipstick: NEGATIVE
Ketones, ur: NEGATIVE mg/dL
Nitrite: NEGATIVE
pH: 6 (ref 5.0–8.0)

## 2011-07-10 LAB — URINE MICROSCOPIC-ADD ON

## 2011-07-10 LAB — WET PREP, GENITAL
Trich, Wet Prep: NONE SEEN
Yeast Wet Prep HPF POC: NONE SEEN

## 2011-07-10 MED ORDER — ACETAMINOPHEN 325 MG PO TABS
650.0000 mg | ORAL_TABLET | Freq: Once | ORAL | Status: AC
  Administered 2011-07-10: 650 mg via ORAL
  Filled 2011-07-10: qty 2

## 2011-07-10 MED ORDER — METRONIDAZOLE 500 MG PO TABS: 500.0000 mg | ORAL_TABLET | Freq: Two times a day (BID) | ORAL | Status: AC

## 2011-07-10 NOTE — Discharge Instructions (Signed)
Drink at least 8 8-oz glasses of water every day. Take Tylenol 325 mg 2 tablets by mouth every 4 hours if needed for pain. Get your prescriptions filled and take all as directed.  May need to use a heating pad or an ice pack to your side for pain control. Putting ice on the injured area. Put ice in a bag. Place a towel between your skin and the bag. Leave the ice on for 15 to 20 minutes, 3 to 4 times a day. Follow up with your caregiver for continued problems and no reason can be found for the pain. If the pain becomes worse or does not go away, it may be necessary to repeat tests or do additional testing. Your caregiver may need to look further for a possible cause.

## 2011-07-10 NOTE — Progress Notes (Signed)
Written and verbal d/c instructions given and understanding voiced. 

## 2011-07-10 NOTE — MAU Provider Note (Signed)
History     CSN: [id number]  Arrival date and time: 07/09/11 2345   First Provider Initiated Contact with Patient 07/10/11 0025      Chief Complaint  Patient presents with  . Abdominal Pain   HPI Brittany [name] 20 y.o. [redacted]w[redacted]d  Comes to MAU with lower abdominal pain all across the lower abdomen and pain in right side under her rib cage.  No vomiting, no nausea, no dysuria.  Was seen on 06-28-11 and given Flexeril for pain.  States it does not help.  Has 2 children at home - youngest is 10 months.  Has not been seen in the office as her Medicaid is not active yet.  OB History    Grav Para Term Preterm Abortions TAB SAB Ect Mult Living   3 2 2       2       Past Medical History  Diagnosis Date  . Asthma   . NVD (normal vaginal delivery) 08/30/2010    Past Surgical History  Procedure Date  . No past surgeries     Family History  Problem Relation Age of Onset  . Anesthesia problems Neg Hx   . Diabetes Maternal Grandmother     History  Substance Use Topics  . Smoking status: Never Smoker   . Smokeless tobacco: Not on file  . Alcohol Use: No    Allergies:  Allergies  Allergen Reactions  . Iodine     unknown  . Sulfonamide Derivatives Other (See Comments)    Causes blisters.    Prescriptions prior to admission  Medication Sig Dispense Refill  . albuterol (VENTOLIN HFA) 108 (90 BASE) MCG/ACT inhaler        . fluticasone (FLOVENT HFA) 110 MCG/ACT inhaler Inhale 1 puff into the lungs daily.        . cyclobenzaprine (FLEXERIL) 10 MG tablet Take 1 tablet (10 mg total) by mouth 3 (three) times daily as needed for muscle spasms.  30 tablet  0    Review of Systems  Constitutional: Negative for fever.  Gastrointestinal: Positive for abdominal pain. Negative for nausea, vomiting, diarrhea and constipation.  Genitourinary: Negative for dysuria.   Physical Exam   Blood pressure 112/56, pulse 87, temperature 98.2 F (36.8 C), temperature source Oral, resp. rate  20, height 5\' 5"  (1.651 m), weight 185 lb (83.915 kg), last menstrual period 02/09/2011, unknown if currently breastfeeding.  Physical Exam  Nursing note and vitals reviewed. Constitutional: She is oriented to person, place, and time. She appears well-developed and well-nourished.  HENT:  Head: Normocephalic.  Eyes: EOM are normal.  Neck: Neck supple.  GI: Soft. There is tenderness. There is no rebound and no guarding.       Tender to palpation all across the very low part of her abdomen.  nontender to pubic symphysis palpation. Also has tenderness in side  - midline under her arm at waist level.  Genitourinary:       Speculum exam: Vagina - Small amount of yellow discharge, no odor Cervix - No contact bleeding Bimanual exam: Cervix closed Uterus non tender, 19 week size Adnexa non tender, no masses bilaterally, tenderness in abdominal wall above uterus GC/Chlam, wet prep done Chaperone present for exam.  Musculoskeletal: Normal range of motion.  Neurological: She is alert and oriented to person, place, and time.  Skin: Skin is warm and dry.  Psychiatric: She has a normal mood and affect.    MAU Course  Procedures Results for orders placed during  the hospital encounter of 07/09/11 (from the past 24 hour(s))  URINALYSIS, ROUTINE W REFLEX MICROSCOPIC     Status: Abnormal   Collection Time   07/09/11 11:50 PM      Component Value Range   Color, Urine YELLOW  YELLOW    APPearance CLEAR  CLEAR    Specific Gravity, Urine 1.010  1.005 - 1.030    pH 6.0  5.0 - 8.0    Glucose, UA NEGATIVE  NEGATIVE (mg/dL)   Hgb urine dipstick NEGATIVE  NEGATIVE    Bilirubin Urine NEGATIVE  NEGATIVE    Ketones, ur NEGATIVE  NEGATIVE (mg/dL)   Protein, ur NEGATIVE  NEGATIVE (mg/dL)   Urobilinogen, UA 0.2  0.0 - 1.0 (mg/dL)   Nitrite NEGATIVE  NEGATIVE    Leukocytes, UA MODERATE (*) NEGATIVE   URINE MICROSCOPIC-ADD ON     Status: Normal   Collection Time   07/09/11 11:50 PM      Component Value  Range   Squamous Epithelial / LPF RARE  RARE    WBC, UA 0-2  <3 (WBC/hpf)   RBC / HPF 0-2  <3 (RBC/hpf)  WET PREP, GENITAL     Status: Abnormal   Collection Time   07/10/11  1:25 AM      Component Value Range   Yeast Wet Prep HPF POC NONE SEEN  NONE SEEN    Trich, Wet Prep NONE SEEN  NONE SEEN    Clue Cells Wet Prep HPF POC MANY (*) NONE SEEN    WBC, Wet Prep HPF POC TOO NUMEROUS TO COUNT (*) NONE SEEN    MDM Will give Tylenol 650 mg PO for pain.  Flexeril did not help pain - she has been taking for 10 days.  Client was more uncomfortable with speculum exam than with palpation to abdomen and right side.  Possibly is having round ligament pain or simply musculoskeletal pain from a growing uterus.  Client states she has a maternity support band at home which she has not had on during this pregnancy.  Assessment and Plan  Bacteria vaginosis  Plan Metronidazole 500 gm po bid x 7 days no refills Drink at least 8 8-oz glasses of water every day. Take Tylenol 325 mg 2 tablets by mouth every 4 hours if needed for pain. May use an ice pack or heating pad to side to help with pain. Hazelyn Kallen 07/10/2011, 1:43 AM

## 2011-07-10 NOTE — MAU Note (Signed)
I was in here 2 wks ago with the same pain and muscle relaxer is not helping-only makes worse. Feels like contractions in my lower stomach and right side.

## 2011-07-12 LAB — GC/CHLAMYDIA PROBE AMP, GENITAL
Chlamydia, DNA Probe: POSITIVE — AB
GC Probe Amp, Genital: NEGATIVE

## 2011-08-25 ENCOUNTER — Other Ambulatory Visit: Payer: Self-pay | Admitting: Obstetrics

## 2011-08-25 DIAGNOSIS — Z0489 Encounter for examination and observation for other specified reasons: Secondary | ICD-10-CM

## 2011-08-25 DIAGNOSIS — IMO0002 Reserved for concepts with insufficient information to code with codable children: Secondary | ICD-10-CM

## 2011-08-27 ENCOUNTER — Ambulatory Visit (HOSPITAL_COMMUNITY)
Admission: RE | Admit: 2011-08-27 | Discharge: 2011-08-27 | Disposition: A | Discharge status: Home / Self Care | Payer: Medicaid Other | Source: Ambulatory Visit | Attending: Obstetrics | Admitting: Obstetrics

## 2011-08-27 DIAGNOSIS — O358XX0 Maternal care for other (suspected) fetal abnormality and damage, not applicable or unspecified: Secondary | ICD-10-CM | POA: Insufficient documentation

## 2011-08-27 DIAGNOSIS — Z1389 Encounter for screening for other disorder: Secondary | ICD-10-CM | POA: Insufficient documentation

## 2011-08-27 DIAGNOSIS — Z0489 Encounter for examination and observation for other specified reasons: Secondary | ICD-10-CM

## 2011-08-27 DIAGNOSIS — IMO0002 Reserved for concepts with insufficient information to code with codable children: Secondary | ICD-10-CM

## 2011-08-27 DIAGNOSIS — Z363 Encounter for antenatal screening for malformations: Secondary | ICD-10-CM | POA: Insufficient documentation

## 2011-08-27 IMAGING — US US OB DETAIL+14 WK
2 series · 12 of 28 positions shown · non-contrast
Comparison: none

[Series 1: us ob detail +14 wk · 1 of 10 slices shown (1 of 2)]
[im 5/10]
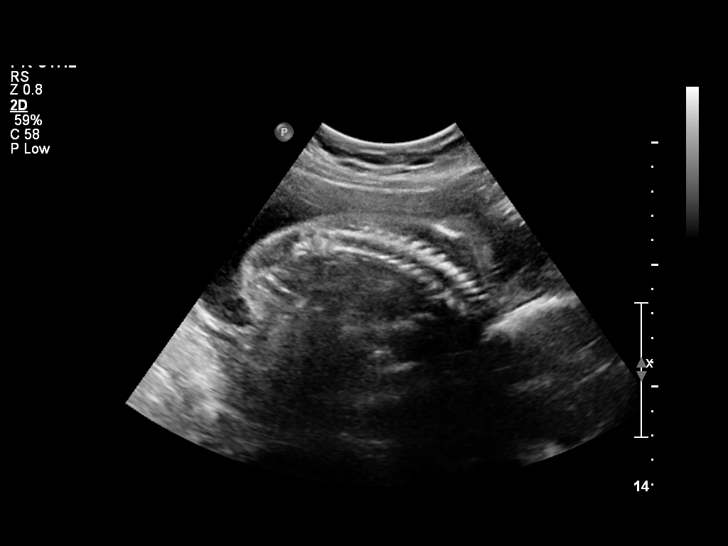

[Series 1: us ob detail +14 wk · 11 of 81 slices shown (2 of 2)]
[im 1/81]
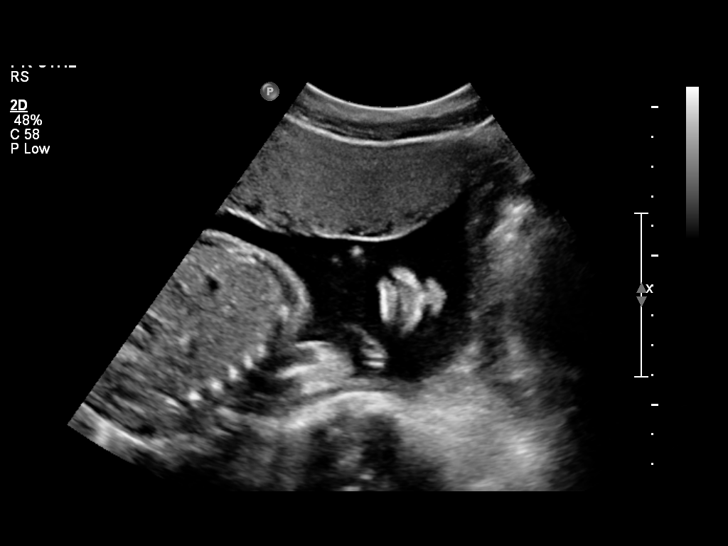
[im 7/81]
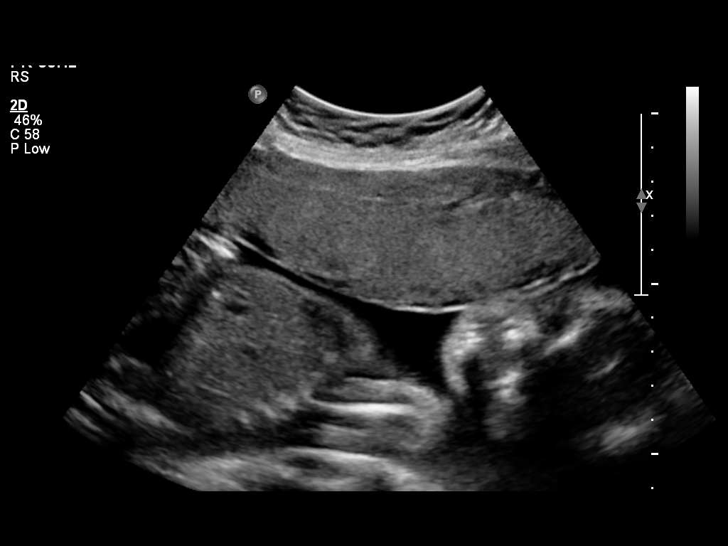
[im 17/81]
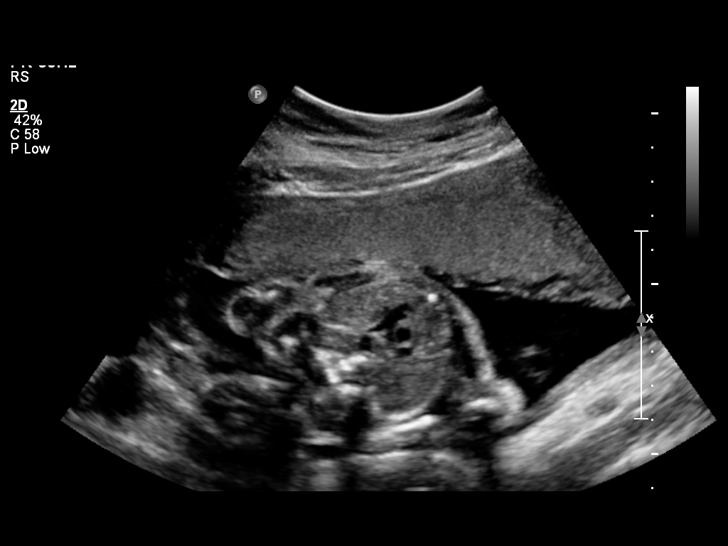
[im 24/81]
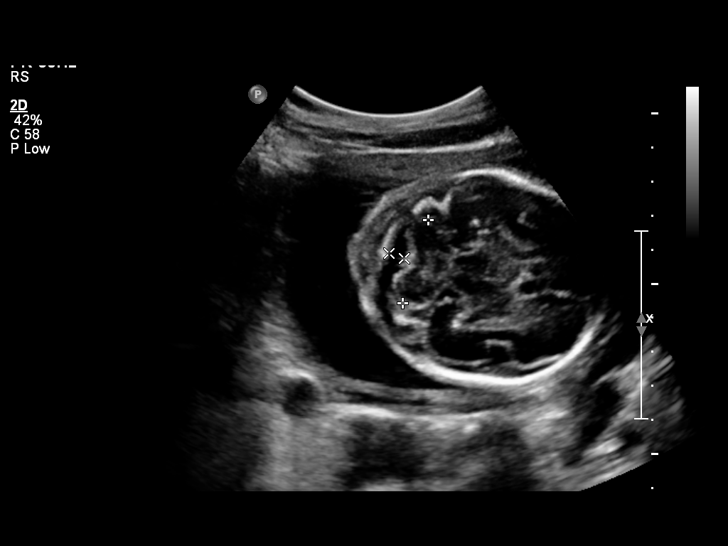
[im 31/81]
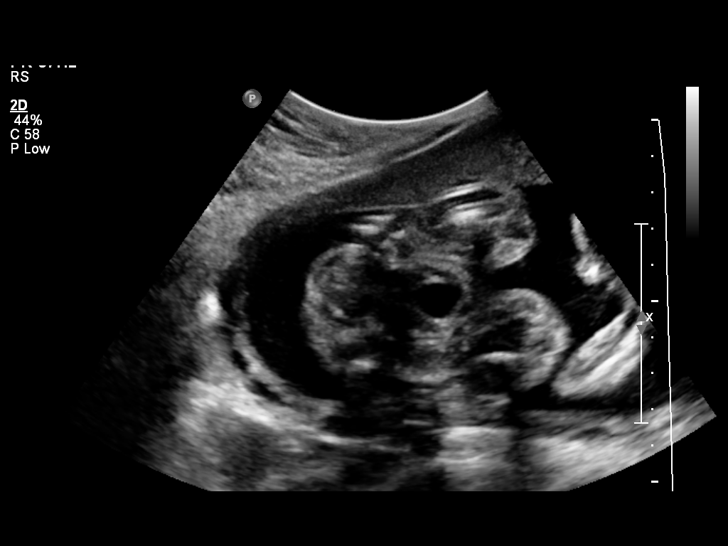
[im 41/81]
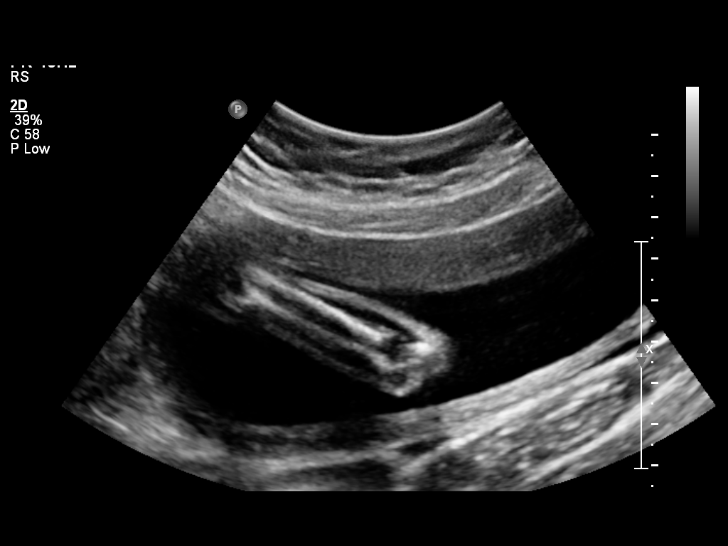
[im 47/81]
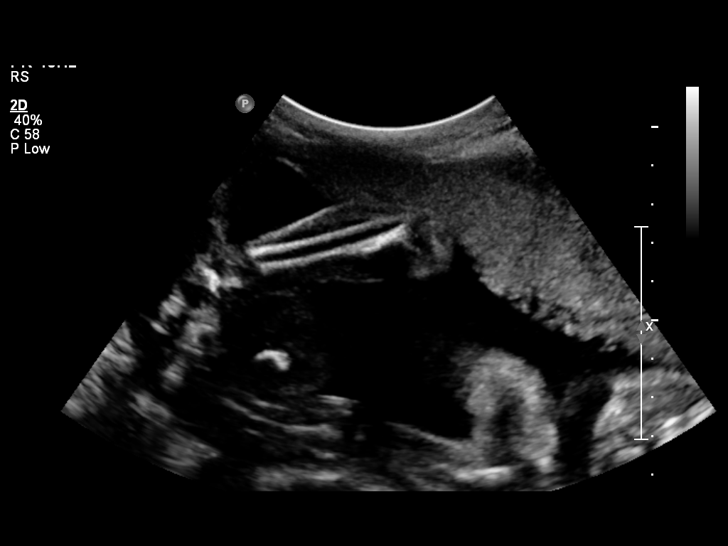
[im 54/81]
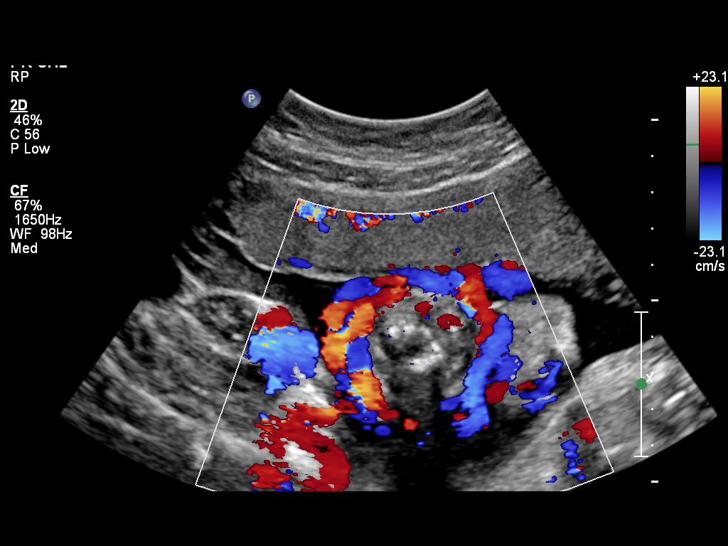
[im 64/81]
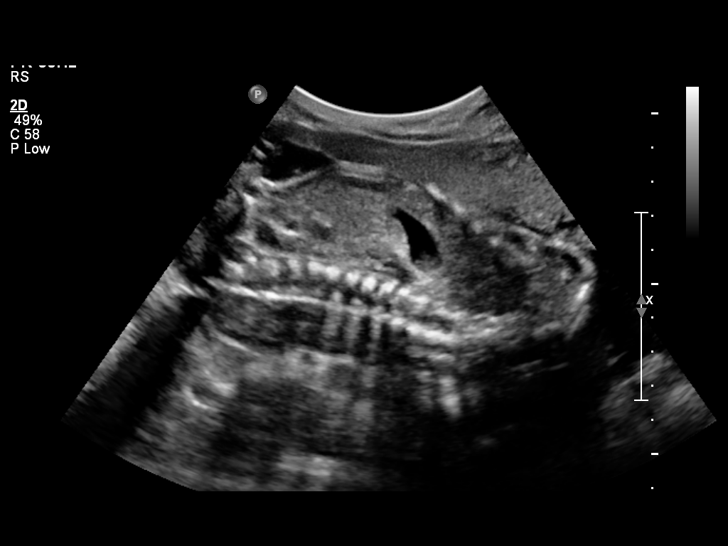
[im 71/81]
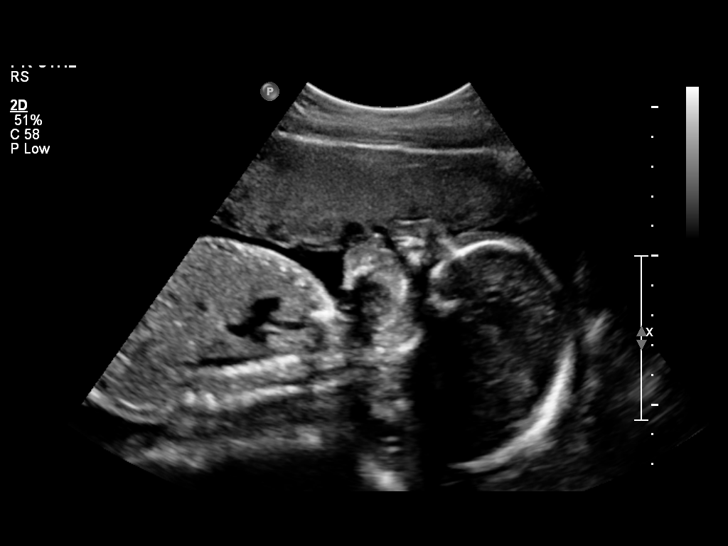
[im 77/81]
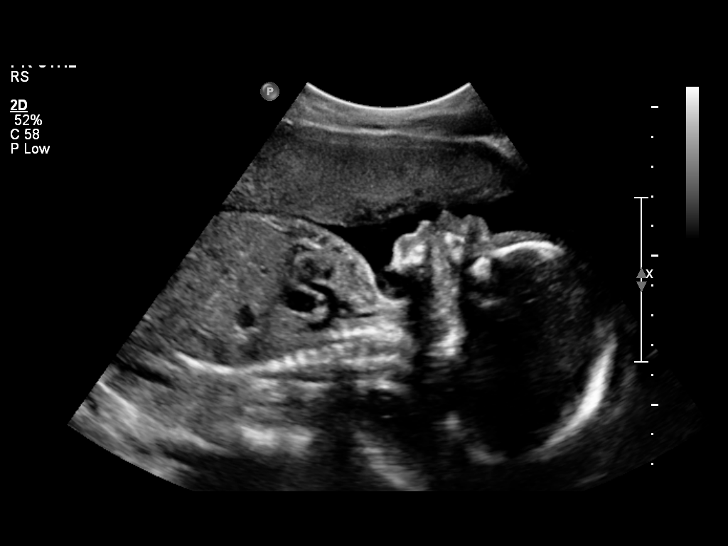

[12 of 28 positions shown; findings below may reference images not displayed]

OBSTETRICS REPORT
                      (Signed Final [DATE] [DATE])

 Order#:         [PHONE_NUMBER]_O
Procedures

 US OB DETAIL + 14 WK                                  76811.0
Indications

 Detailed fetal anatomic survey                        655.83 [CC]
Fetal Evaluation

 Preg. Location:    Intrauterine
 Fetal Heart Rate:  136                         bpm
 Cardiac Activity:  Observed
 Presentation:      Cephalic
 Placenta:          Anterior, above cervical os

 Amniotic Fluid
 AFI FV:      Subjectively within normal limits
                                             Larg Pckt:   4.85   cm
Biometry

 BPD:     59.1  mm    G. Age:   24w 1d                CI:        70.61   70 - 86
                                                      FL/HC:      19.8   18.7 -

 HC:     224.2  mm    G. Age:   24w 3d       14  %    HC/AC:      1.16   1.04 -

 AC:     192.8  mm    G. Age:   24w 0d       15  %    FL/BPD:     75.0   71 - 87
 FL:      44.3  mm    G. Age:   24w 4d       24  %    FL/AC:      23.0   20 - 24
 HUM:     42.8  mm    G. Age:   25w 4d       59  %

 Est. FW:     678  gm      1 lb 8 oz     36  %
Gestational Age

 LMP:           28w 3d       Date:   [DATE]                 EDD:   [DATE]
 U/S Today:     24w 2d                                        EDD:   [DATE]
 Best:          25w 0d    Det. By:   U/S C R L ([DATE])     EDD:   [DATE]
Anatomy

 Cranium:           Appears normal      Aortic Arch:       Appears normal
 Fetal Cavum:       Appears normal      Ductal Arch:       Appears normal
 Ventricles:        Appears normal      Diaphragm:         Appears normal
 Choroid Plexus:    Appears normal      Stomach:           Appears
                                                           normal, left
                                                           sided
 Cerebellum:        Appears normal      Abdomen:           Appears normal
 Posterior Fossa:   Appears normal      Abdominal Wall:    Appears nml
                                                           (cord insert,
                                                           abd wall)
 Nuchal Fold:       Not applicable      Cord Vessels:      Appears normal
                    (>20 wks GA)                           (3 vessel cord)
 Face:              Appears normal      Kidneys:           Appear normal
                    (lips/profile/orbit
                    s)
 Heart:             Appears normal      Bladder:           Appears normal
                    (4 chamber &
                    axis)
 RVOT:              Appears normal      Spine:             Appears normal
 LVOT:              Appears normal      Limbs:             Appears normal
                                                           (hands, ankles,
                                                           feet)

 Other:     5th digit visualized. Fetus appears to be a female.
            Technically difficult due to fetal position.
Cervix Uterus Adnexa

 Cervical Length:   3.24      cm

 Cervix:       Closed.
 Uterus:       No abnormality visualized.
 Left Ovary:   No adnexal mass visualized.
 Right Ovary:  No adnexal mass visualized.
Impression

 Single intrauterine gestation demonstrating an estimated
 gestational age by ultrasound of 24w 2d  . This correlates
 well with expected EGA by early ultrasound of 25w 0d  .

 No focal fetal or placental abnormalities are noted witha good
 anatomic evaluatio possible. No soft markers for Down
 Syndrome are seen. Sonographic modification of Down
 Syndrome risk was not performed as the patient is beyond
 the EGA at which this assessment is typically performed.

 Subjectively and quantitatively normal amniotic fluid volume.
 Normal cervical length and appearance.

## 2011-10-07 ENCOUNTER — Emergency Department (HOSPITAL_COMMUNITY)
Admission: EM | Admit: 2011-10-07 | Discharge: 2011-10-07 | Disposition: A | Discharge status: Home / Self Care | Payer: Self-pay | Attending: Emergency Medicine | Admitting: Emergency Medicine

## 2011-10-07 ENCOUNTER — Encounter (HOSPITAL_COMMUNITY): Payer: Self-pay | Admitting: Emergency Medicine

## 2011-10-07 DIAGNOSIS — S61209A Unspecified open wound of unspecified finger without damage to nail, initial encounter: Secondary | ICD-10-CM | POA: Insufficient documentation

## 2011-10-07 DIAGNOSIS — W261XXA Contact with sword or dagger, initial encounter: Secondary | ICD-10-CM | POA: Insufficient documentation

## 2011-10-07 DIAGNOSIS — W260XXA Contact with knife, initial encounter: Secondary | ICD-10-CM | POA: Insufficient documentation

## 2011-10-07 DIAGNOSIS — IMO0002 Reserved for concepts with insufficient information to code with codable children: Secondary | ICD-10-CM

## 2011-10-07 MED ORDER — TETANUS-DIPHTH-ACELL PERTUSSIS 5-2.5-18.5 LF-MCG/0.5 IM SUSP
0.5000 mL | Freq: Once | INTRAMUSCULAR | Status: DC
  Filled 2011-10-07: qty 0.5

## 2011-10-07 NOTE — ED Provider Notes (Signed)
History     CSN: [id number]  Arrival date & time 10/07/11  1752   First MD Initiated Contact with Patient 10/07/11 1842      No chief complaint on file.   (Consider location/radiation/quality/duration/timing/severity/associated sxs/prior treatment) HPI  21 y.o. ill in no acute distress complaining of laceration to left third digit earlier in the day she was cut with a knife. Bleeding is controlled in her childhood vaccinations are up-to-date. She reports no pain and no decrease in range of motion.   Past Medical History  Diagnosis Date  . Asthma   . NVD (normal vaginal delivery) 08/30/2010    Past Surgical History  Procedure Date  . No past surgeries     Family History  Problem Relation Age of Onset  . Anesthesia problems Neg Hx   . Diabetes Maternal Grandmother     History  Substance Use Topics  . Smoking status: Never Smoker   . Smokeless tobacco: Not on file  . Alcohol Use: No    OB History    Grav Para Term Preterm Abortions TAB SAB Ect Mult Living   3 2 2       2       Review of Systems  Skin: Positive for wound.    Allergies  Iodine and Sulfonamide derivatives  Home Medications   Current Outpatient Rx  Name Route Sig Dispense Refill  . PRENATAL MULTIVITAMIN CH Oral Take 1 tablet by mouth daily.      BP 108/64  Pulse 85  Temp 98.6 F (37 C)  Resp 18  SpO2 100%  LMP 02/09/2011  Breastfeeding? Unknown  Physical Exam  Nursing note and vitals reviewed. Constitutional: She is oriented to person, place, and time. She appears well-developed and well-nourished. No distress.  HENT:  Head: Normocephalic.  Eyes: Conjunctivae and EOM are normal.  Cardiovascular: Normal rate.   Pulmonary/Chest: Effort normal.  Musculoskeletal: Normal range of motion.  Neurological: She is alert and oriented to person, place, and time.  Skin:       Patient has 1 cm partial to full thickness non-ragged laceration to palmar side of the DIP of the left third digit.  There is no joint involvement patient has full range of motion in flexion and extension isolated joint movement of the DIP  Psychiatric: She has a normal mood and affect.    ED Course  Procedures (including critical care time)  LACERATION REPAIR Performed by: Wynetta Emery Authorized by: Wynetta Emery Consent: Verbal consent obtained. Risks and benefits: risks, benefits and alternatives were discussed Consent given by: patient Patient identity confirmed: provided demographic data Prepped and Draped in normal sterile fashion Wound explored  Laceration Location:  Left 3rd digit. DIP on palmer side.   Laceration Length: 1cm  No Foreign Bodies seen or palpated   Irrigation method: syringe Amount of cleaning: standard  Skin closure: Dermabond   Patient tolerance: Patient tolerated the procedure well with no immediate complications.  Labs Reviewed - No data to display No results found.   1. Laceration       MDM  Partial to full thickness laceration to left third digit distal interphalangeal joint with full range of motion. Wound is clean wound was closed with Dermabond after irrigation. Pt verbalized understanding and agrees with care plan. Outpatient follow-up and return precautions given.          Wynetta Emery, PA-C 10/07/11 2030

## 2011-10-07 NOTE — ED Notes (Signed)
Pt has small lac to left middle finger. Bleeding is stopped and bandaid applied.

## 2011-10-07 NOTE — ED Provider Notes (Signed)
Medical screening examination/treatment/procedure(s) were performed by non-physician practitioner and as supervising physician I was immediately available for consultation/collaboration.    Ikia Cincotta L Kylar Speelman, MD 10/07/11 2330 

## 2011-10-22 ENCOUNTER — Inpatient Hospital Stay (HOSPITAL_COMMUNITY)
Admission: AD | Admit: 2011-10-22 | Discharge: 2011-10-23 | Disposition: A | Discharge status: Home / Self Care | Payer: Medicaid Other | Source: Ambulatory Visit | Attending: Obstetrics & Gynecology | Admitting: Obstetrics & Gynecology

## 2011-10-22 ENCOUNTER — Encounter (HOSPITAL_COMMUNITY): Payer: Self-pay

## 2011-10-22 ENCOUNTER — Inpatient Hospital Stay (HOSPITAL_COMMUNITY): Payer: Medicaid Other

## 2011-10-22 DIAGNOSIS — O479 False labor, unspecified: Secondary | ICD-10-CM

## 2011-10-22 DIAGNOSIS — R109 Unspecified abdominal pain: Secondary | ICD-10-CM | POA: Insufficient documentation

## 2011-10-22 DIAGNOSIS — N949 Unspecified condition associated with female genital organs and menstrual cycle: Secondary | ICD-10-CM

## 2011-10-22 DIAGNOSIS — O99891 Other specified diseases and conditions complicating pregnancy: Secondary | ICD-10-CM | POA: Insufficient documentation

## 2011-10-22 DIAGNOSIS — Z331 Pregnant state, incidental: Secondary | ICD-10-CM

## 2011-10-22 DIAGNOSIS — R1084 Generalized abdominal pain: Secondary | ICD-10-CM

## 2011-10-22 LAB — URINALYSIS, ROUTINE W REFLEX MICROSCOPIC
Ketones, ur: NEGATIVE mg/dL
Nitrite: NEGATIVE
Protein, ur: NEGATIVE mg/dL

## 2011-10-22 IMAGING — US US FETAL BPP W/O NONSTRESS
1 series · 8 of 8 positions shown · non-contrast
Comparison: none

CLINICAL DATA: Nonreactive nonstress test.  33 weeks pregnant.

BIOPHYSICAL PROFILE
Number of Fetuses: 1
Heart Rate: 135 bpm
Presentation: Cephalic
Movement: Yes
Amniotic Fluid (Subjective):  Within normal limits
Vertical pocket:  6.6 cm
BPP:
Movement:  2      Time:  7 minutes
Breathing: 2
Tone:   2
Amniotic Fluid:  2
Total Score:  [DATE]

[Series 1: us fetal bpp w/o nonstress · non-contrast · 8 acquisitions, 8 frames shown]
[im 1/8]
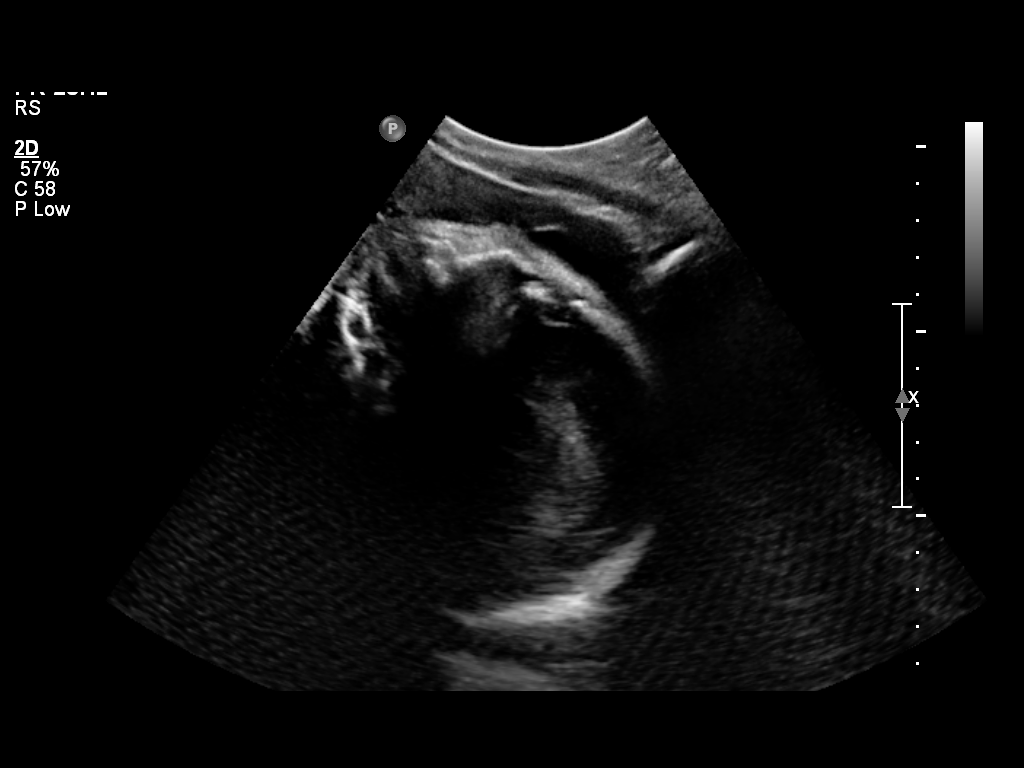
[im 2/8]
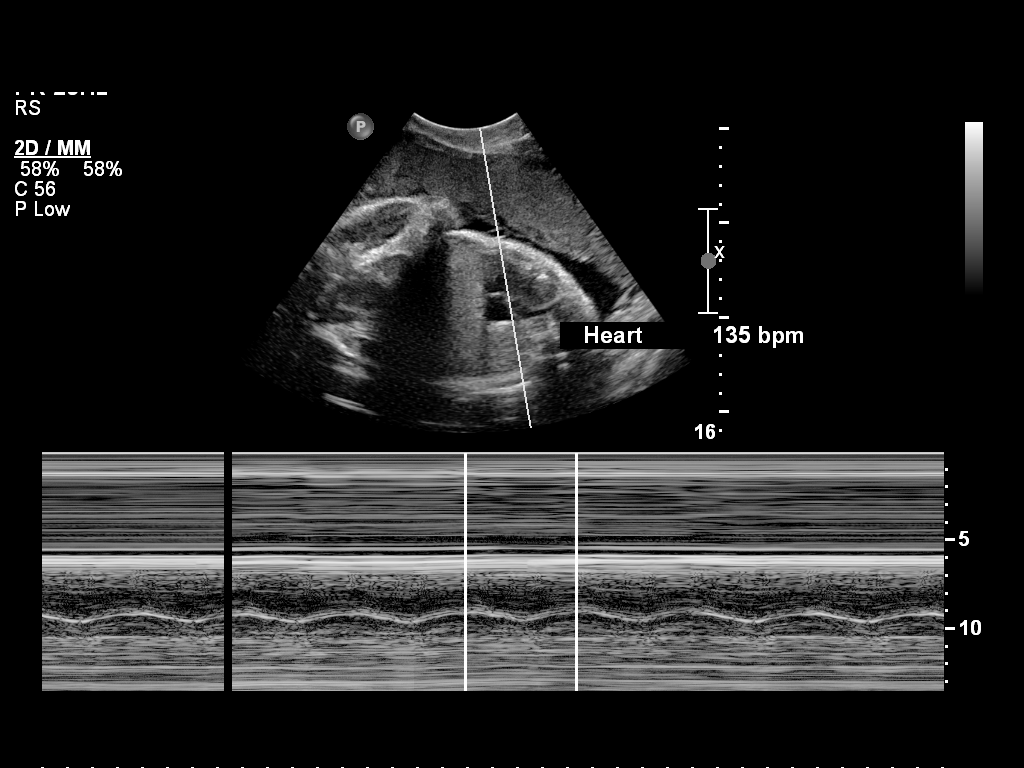
[im 3/8]
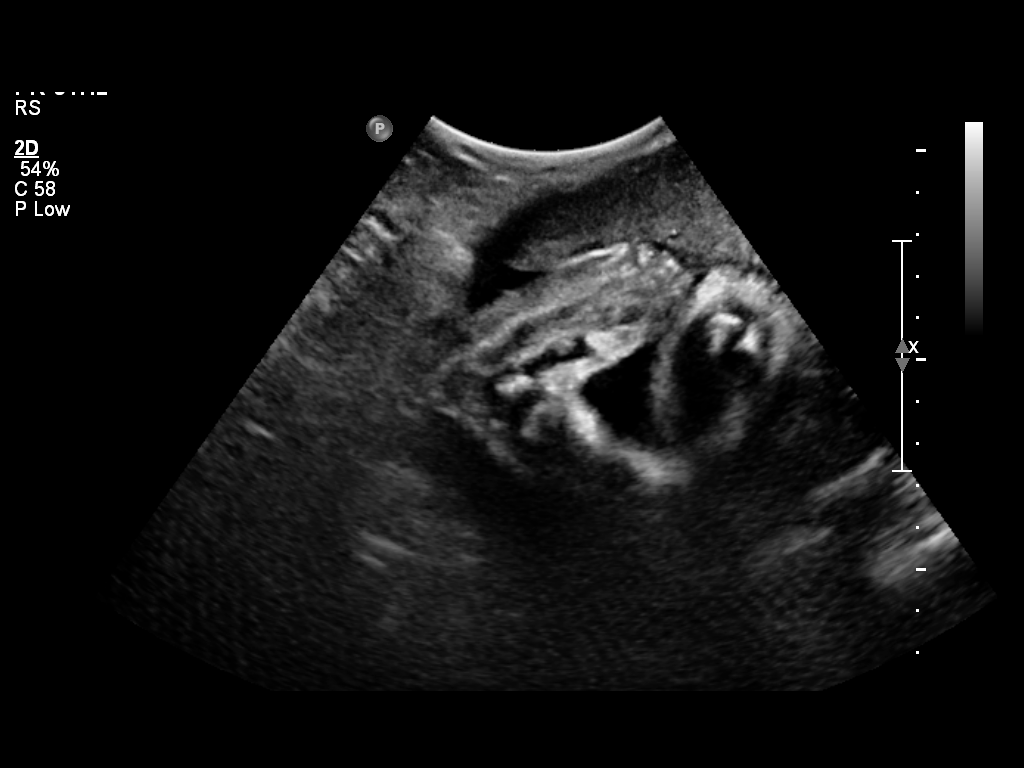
[im 4/8]
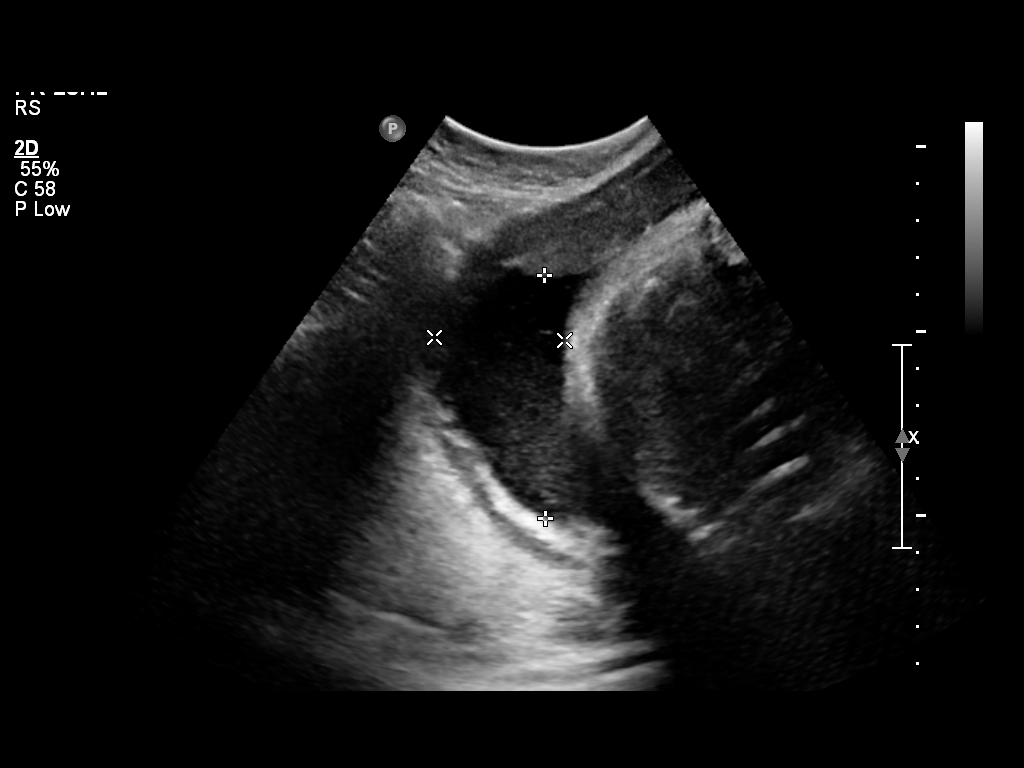
[im 5/8]
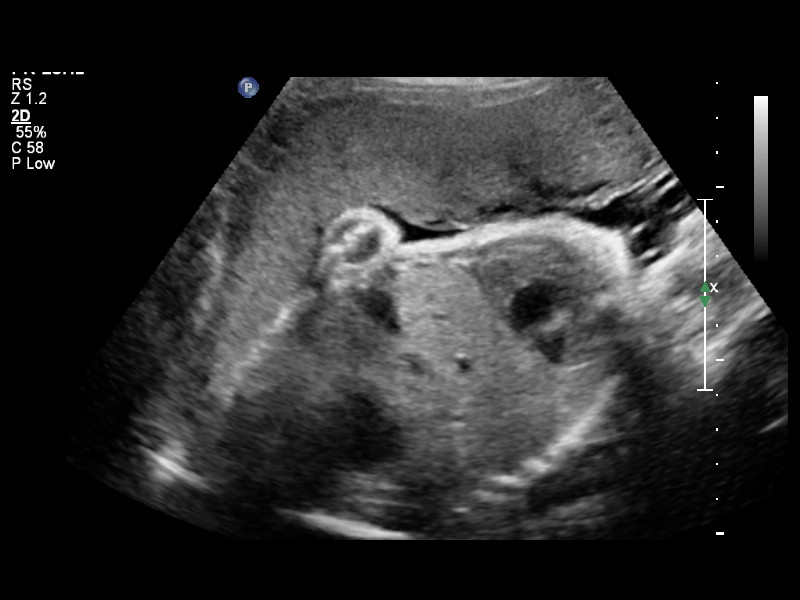
[im 6/8]
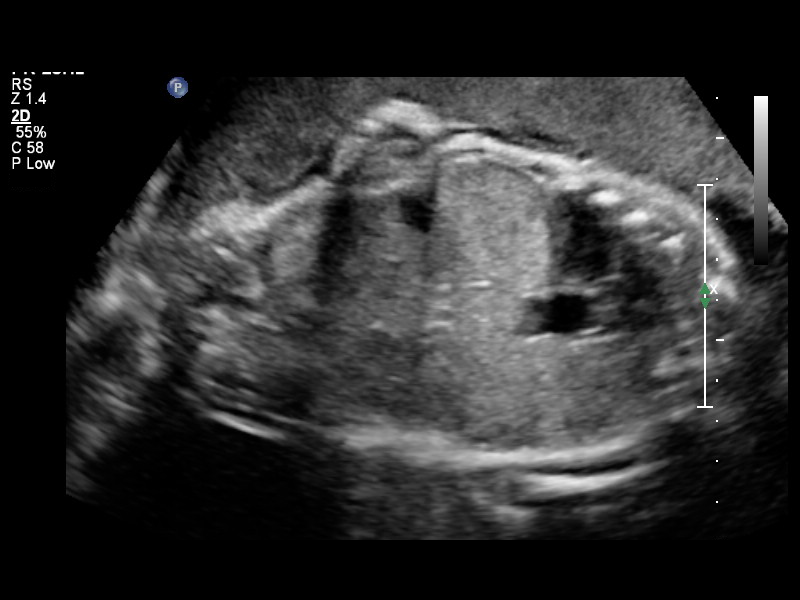
[im 7/8]
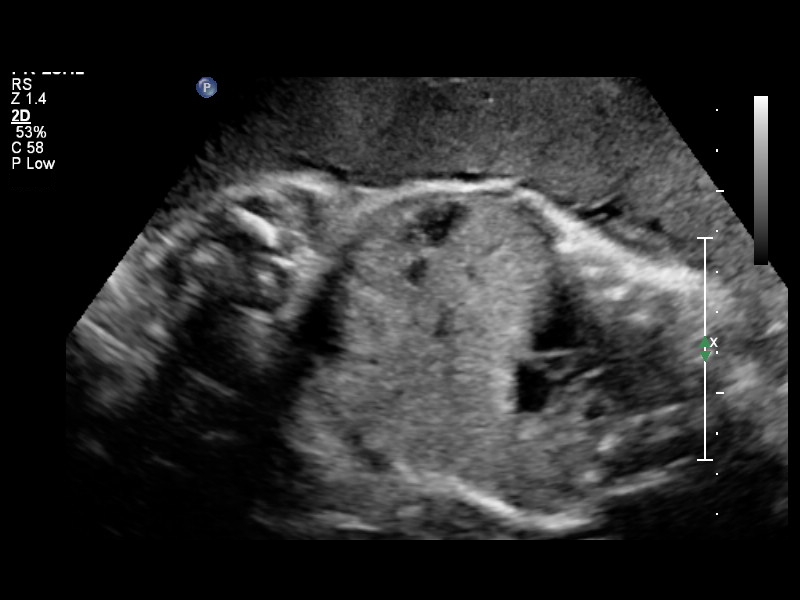
[im 8/8]
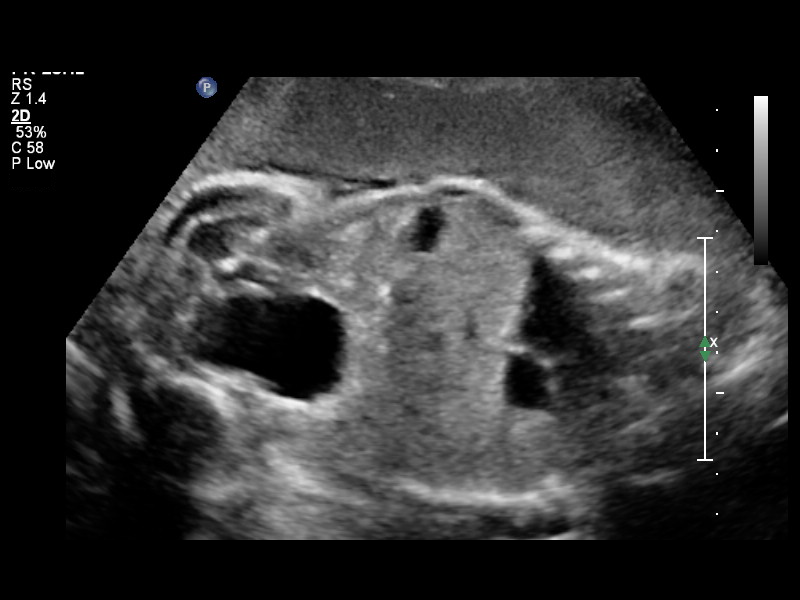

[8 of 8 positions shown; findings below may reference images not displayed]

IMPRESSION: Biophysical profile score of [DATE].

## 2011-10-22 NOTE — Progress Notes (Addendum)
Brittany [name] is  21 y.o. [id number] at [redacted]w[redacted]d presents complaining of diffuse Abdominal Pain She reports pain is sharp in nature, constant, and all over abdomen.  She denies nausea or vomiting. She states pain worsens with ambulation. Obstetrical/Gynecological History: Menstrual History: OB History    Grav Para Term Preterm Abortions TAB SAB Ect Mult Living   3 2 2       2        Patient's last menstrual period was 02/09/2011.     Past Medical History: Past Medical History  Diagnosis Date  . Asthma   . NVD (normal vaginal delivery) 08/30/2010    Past Surgical History: Past Surgical History  Procedure Date  . No past surgeries     Family History: Family History  Problem Relation Age of Onset  . Anesthesia problems Neg Hx   . Diabetes Maternal Grandmother     Social History: History  Substance Use Topics  . Smoking status: Never Smoker   . Smokeless tobacco: Not on file  . Alcohol Use: No    Allergies:  Allergies  Allergen Reactions  . Iodine Other (See Comments)    BLISTERS  . Sulfonamide Derivatives Other (See Comments)    Causes blisters.    Meds:  Prescriptions prior to admission  Medication Sig Dispense Refill  . Prenatal Vit-Fe Fumarate-FA (PRENATAL MULTIVITAMIN) TABS Take 1 tablet by mouth daily.        Review of Systems - Please refer to the aforementioned patients' reports.     Physical Exam  Blood pressure 114/60, pulse 81, temperature 98.1 F (36.7 C), temperature source Oral, resp. rate 16, height 5\' 5"  (1.651 m), weight 91.627 kg (202 lb), last menstrual period 02/09/2011, unknown if currently breastfeeding. GENERAL: Well-developed, well-nourished female in no acute distress.  ABDOMEN: Soft, nontender, nondistended, gravid.  EXTREMITIES: Nontender, no edema.  FHT:  Baseline rate 135 bpm   Variability moderate  Accelerations absent   Decelerations none Contractions: none  SVE: closed, thick, high   Labs: Recent Results (from the past  24 hour(s))  URINALYSIS, ROUTINE W REFLEX MICROSCOPIC   Collection Time   10/22/11  9:20 PM      Component Value Range   Color, Urine YELLOW  YELLOW   APPearance CLEAR  CLEAR   Specific Gravity, Urine 1.015  1.005 - 1.030   pH 7.0  5.0 - 8.0   Glucose, UA NEGATIVE  NEGATIVE mg/dL   Hgb urine dipstick NEGATIVE  NEGATIVE   Bilirubin Urine NEGATIVE  NEGATIVE   Ketones, ur NEGATIVE  NEGATIVE mg/dL   Protein, ur NEGATIVE  NEGATIVE mg/dL   Urobilinogen, UA 1.0  0.0 - 1.0 mg/dL   Nitrite NEGATIVE  NEGATIVE   Leukocytes, UA SMALL (*) NEGATIVE  URINE MICROSCOPIC-ADD ON   Collection Time   10/22/11  9:20 PM      Component Value Range   Squamous Epithelial / LPF FEW (*) RARE   WBC, UA 3-6  <3 WBC/hpf   Bacteria, UA FEW (*) RARE   FFN-negative BPP 8/8  Imaging Studies:  No results found.  Assessment: Brittany [name] is  21 y.o. [id number] at [redacted]w[redacted]d presents with abdominal pain. Round ligament pain  Plan: Discharge home Instructions for comfort measures given F/U with [facility] in 1-2 weeks, if not able to d/t insurance then f/u in [facility]  [place] 9/6/201310:44 PM

## 2011-10-22 NOTE — MAU Note (Signed)
Onset of sharp pains x 2 days, today has been constant, feels like stomach gets hard.

## 2011-10-22 NOTE — MAU Note (Signed)
Patient states she was a patient at [facility] however could not obtain her pregnancy Medicaid so they stopped seeing her, patient was last seen ? 09/17/11

## 2011-10-23 NOTE — Progress Notes (Signed)
I was present for the exam and agree with above.  [place], CNM 10/23/2011 1:17 AM

## 2011-11-21 ENCOUNTER — Encounter (HOSPITAL_COMMUNITY): Payer: Self-pay | Admitting: *Deleted

## 2011-11-21 ENCOUNTER — Inpatient Hospital Stay (HOSPITAL_COMMUNITY)
Admission: AD | Admit: 2011-11-21 | Discharge: 2011-11-22 | DRG: 781 | Disposition: A | Discharge status: Home / Self Care | Payer: Medicaid Other | Source: Ambulatory Visit | Attending: Obstetrics & Gynecology | Admitting: Obstetrics & Gynecology

## 2011-11-21 DIAGNOSIS — IMO0002 Reserved for concepts with insufficient information to code with codable children: Secondary | ICD-10-CM | POA: Diagnosis present

## 2011-11-21 DIAGNOSIS — O99891 Other specified diseases and conditions complicating pregnancy: Principal | ICD-10-CM | POA: Diagnosis present

## 2011-11-21 DIAGNOSIS — R079 Chest pain, unspecified: Secondary | ICD-10-CM | POA: Diagnosis present

## 2011-11-21 DIAGNOSIS — J45909 Unspecified asthma, uncomplicated: Secondary | ICD-10-CM

## 2011-11-21 DIAGNOSIS — R51 Headache: Secondary | ICD-10-CM | POA: Diagnosis present

## 2011-11-21 DIAGNOSIS — O99323 Drug use complicating pregnancy, third trimester: Secondary | ICD-10-CM

## 2011-11-21 LAB — URINE MICROSCOPIC-ADD ON

## 2011-11-21 LAB — URINALYSIS, ROUTINE W REFLEX MICROSCOPIC
Bilirubin Urine: NEGATIVE
Hgb urine dipstick: NEGATIVE
Nitrite: NEGATIVE
Specific Gravity, Urine: 1.02 (ref 1.005–1.030)
pH: 6 (ref 5.0–8.0)

## 2011-11-21 NOTE — MAU Note (Signed)
Pt presents with complaint of swelling in feet and legs, headache since this am, and states her chest is hurting but states she thinks it is her asthma.

## 2011-11-22 DIAGNOSIS — F1910 Other psychoactive substance abuse, uncomplicated: Secondary | ICD-10-CM

## 2011-11-22 DIAGNOSIS — J45909 Unspecified asthma, uncomplicated: Secondary | ICD-10-CM

## 2011-11-22 DIAGNOSIS — R51 Headache: Secondary | ICD-10-CM

## 2011-11-22 DIAGNOSIS — O99891 Other specified diseases and conditions complicating pregnancy: Secondary | ICD-10-CM

## 2011-11-22 DIAGNOSIS — O9989 Other specified diseases and conditions complicating pregnancy, childbirth and the puerperium: Secondary | ICD-10-CM

## 2011-11-22 DIAGNOSIS — O99340 Other mental disorders complicating pregnancy, unspecified trimester: Secondary | ICD-10-CM

## 2011-11-22 DIAGNOSIS — R079 Chest pain, unspecified: Secondary | ICD-10-CM

## 2011-11-22 LAB — COMPREHENSIVE METABOLIC PANEL
ALT: 7 U/L (ref 0–35)
AST: 13 U/L (ref 0–37)
Albumin: 2.4 g/dL — ABNORMAL LOW (ref 3.5–5.2)
Alkaline Phosphatase: 98 U/L (ref 39–117)
GFR calc Af Amer: >90 mL/min (ref >90)
Glucose, Bld: 113 mg/dL — ABNORMAL HIGH (ref 70–99)
Potassium: 3 mEq/L — ABNORMAL LOW (ref 3.5–5.1)
Sodium: 135 mEq/L (ref 135–145)
Total Protein: 6 g/dL (ref 6.0–8.3)

## 2011-11-22 LAB — RAPID URINE DRUG SCREEN, HOSP PERFORMED
Amphetamines: NOT DETECTED
Benzodiazepines: NOT DETECTED
Cocaine: NOT DETECTED
Opiates: NOT DETECTED
Tetrahydrocannabinol: POSITIVE — AB

## 2011-11-22 LAB — CBC WITH DIFFERENTIAL/PLATELET
Eosinophils Absolute: 0.8 10*3/uL — ABNORMAL HIGH (ref 0.0–0.7)
Lymphs Abs: 3.1 10*3/uL (ref 0.7–4.0)
MCH: 27.5 pg (ref 26.0–34.0)
Neutrophils Relative %: 61 % (ref 43–77)
Platelets: 204 10*3/uL (ref 150–400)
RBC: 3.46 MIL/uL — ABNORMAL LOW (ref 3.87–5.11)
WBC: 12.7 10*3/uL — ABNORMAL HIGH (ref 4.0–10.5)

## 2011-11-22 MED ORDER — ALBUTEROL SULFATE HFA 108 (90 BASE) MCG/ACT IN AERS
1.0000 | INHALATION_SPRAY | RESPIRATORY_TRACT | Status: DC
  Administered 2011-11-22: 2 via RESPIRATORY_TRACT
  Filled 2011-11-22: qty 6.7

## 2011-11-22 MED ORDER — OXYCODONE-ACETAMINOPHEN 5-325 MG PO TABS
1.0000 | ORAL_TABLET | Freq: Once | ORAL | Status: AC
  Administered 2011-11-22: 1 via ORAL
  Filled 2011-11-22: qty 1

## 2011-11-22 MED ORDER — BUTALBITAL-APAP-CAFFEINE 50-325-40 MG PO TABS
1.0000 | ORAL_TABLET | ORAL | Status: DC | PRN
  Administered 2011-11-22: 1 via ORAL
  Filled 2011-11-22: qty 1

## 2011-11-22 MED ORDER — ALBUTEROL SULFATE (5 MG/ML) 0.5% IN NEBU
2.5000 mg | INHALATION_SOLUTION | RESPIRATORY_TRACT | Status: DC | PRN
  Administered 2011-11-22: 2.5 mg via RESPIRATORY_TRACT

## 2011-11-22 NOTE — Discharge Summary (Signed)
RN put an admission in on this patient, on error. She was sent home from MAU. Please see MAU note for further details.

## 2011-11-22 NOTE — MAU Note (Signed)
RT in for a breathing treatment

## 2011-11-22 NOTE — MAU Note (Signed)
Pt complains of increased pain in her abd and pressure-monitors reapplied-pt appears relaxed with no signs of discomfort

## 2011-11-22 NOTE — MAU Note (Signed)
Pt compains of increased pressure and abd discomfort-monitor reapplied-pt appears relaxed with no signs of discomfort

## 2011-11-22 NOTE — MAU Provider Note (Signed)
History    Brittany [name] is a 21 y.o. [id number] @ [redacted]w[redacted]d who presents today with chest tightening, swollen ankles and a severe headache that does not get better with tylenol.  CSN: [id number]  Arrival date and time: 11/21/11 2300   None     Chief Complaint  Patient presents with  . Leg Swelling   HPI    Past Medical History  Diagnosis Date  . Asthma   . NVD (normal vaginal delivery) 08/30/2010  . No pertinent past medical history     Past Surgical History  Procedure Date  . No past surgeries     Family History  Problem Relation Age of Onset  . Anesthesia problems Neg Hx   . Diabetes Maternal Grandmother     History  Substance Use Topics  . Smoking status: Never Smoker   . Smokeless tobacco: Not on file  . Alcohol Use: No    Allergies:  Allergies  Allergen Reactions  . Iodine Other (See Comments)    BLISTERS  . Sulfonamide Derivatives Other (See Comments)    Causes blisters.    Prescriptions prior to admission  Medication Sig Dispense Refill  . Prenatal Vit-Fe Fumarate-FA (PRENATAL MULTIVITAMIN) TABS Take 1 tablet by mouth daily.        ROS Physical Exam   Blood pressure 132/84, pulse 97, temperature 98.1 F (36.7 C), temperature source Oral, resp. rate 20, last menstrual period 02/09/2011, SpO2 99.00%, unknown if currently breastfeeding.  Physical Exam  MAU Course  Procedures  0100: called to patient's room. Pt c/o of wheezing and chest tightening. Sp02 100% on RA. Occasional wheezes in upper airway. Will give patient albuterol breathing treatment.   0130: RT gave breathing treatment. Pt passed a large amount of yellow phlegm, and wheezing has stopped. Will give her 1 dose of Fioricet for headache. Labs pending.   0200: Headache is improving with Fioricet.   0230: states headache is worse. Given 1 percocet  0300: headache is better now. Resting comfortably. Will collect GBS swab, and DC patient home U P:C ratio not available at this  time. Was sent to [facility] lab. Uncertain on length of time until result will be available. Given improved headache and normal serum labs will plan to DC patient home.    Results for orders placed during the hospital encounter of 11/21/11 (from the past 24 hour(s))  URINALYSIS, ROUTINE W REFLEX MICROSCOPIC     Status: Abnormal   Collection Time   11/21/11 11:05 PM      Component Value Range   Color, Urine YELLOW  YELLOW   APPearance CLEAR  CLEAR   Specific Gravity, Urine 1.020  1.005 - 1.030   pH 6.0  5.0 - 8.0   Glucose, UA NEGATIVE  NEGATIVE mg/dL   Hgb urine dipstick NEGATIVE  NEGATIVE   Bilirubin Urine NEGATIVE  NEGATIVE   Ketones, ur NEGATIVE  NEGATIVE mg/dL   Protein, ur NEGATIVE  NEGATIVE mg/dL   Urobilinogen, UA 0.2  0.0 - 1.0 mg/dL   Nitrite NEGATIVE  NEGATIVE   Leukocytes, UA TRACE (*) NEGATIVE  URINE MICROSCOPIC-ADD ON     Status: Abnormal   Collection Time   11/21/11 11:05 PM      Component Value Range   Squamous Epithelial / LPF FEW (*) RARE   WBC, UA 3-6  <3 WBC/hpf   RBC / HPF 0-2  <3 RBC/hpf   Bacteria, UA FEW (*) RARE  PROTEIN / CREATININE RATIO, URINE     Status:  Normal   Collection Time   11/21/11 11:05 PM      Component Value Range   Creatinine, Urine 165.00     Total Protein, Urine 13.2     PROTEIN CREATININE RATIO 0.08  0.00 - 0.15  URINE RAPID DRUG SCREEN (HOSP PERFORMED)     Status: Abnormal   Collection Time   11/21/11 11:05 PM      Component Value Range   Opiates NONE DETECTED  NONE DETECTED   Cocaine NONE DETECTED  NONE DETECTED   Benzodiazepines NONE DETECTED  NONE DETECTED   Amphetamines NONE DETECTED  NONE DETECTED   Tetrahydrocannabinol POSITIVE (*) NONE DETECTED   Barbiturates NONE DETECTED  NONE DETECTED  CBC WITH DIFFERENTIAL     Status: Abnormal   Collection Time   11/22/11 12:33 AM      Component Value Range   WBC 12.7 (*) 4.0 - 10.5 K/uL   RBC 3.46 (*) 3.87 - 5.11 MIL/uL   Hemoglobin 9.5 (*) 12.0 - 15.0 g/dL   HCT 16.1 (*) 09.6 - 04.5 %     MCV 78.9  78.0 - 100.0 fL   MCH 27.5  26.0 - 34.0 pg   MCHC 34.8  30.0 - 36.0 g/dL   RDW 40.9  81.1 - 91.4 %   Platelets 204  150 - 400 K/uL   Neutrophils Relative 61  43 - 77 %   Neutro Abs 7.8 (*) 1.7 - 7.7 K/uL   Lymphocytes Relative 25  12 - 46 %   Lymphs Abs 3.1  0.7 - 4.0 K/uL   Monocytes Relative 8  3 - 12 %   Monocytes Absolute 1.0  0.1 - 1.0 K/uL   Eosinophils Relative 6 (*) 0 - 5 %   Eosinophils Absolute 0.8 (*) 0.0 - 0.7 K/uL   Basophils Relative 0  0 - 1 %   Basophils Absolute 0.0  0.0 - 0.1 K/uL  COMPREHENSIVE METABOLIC PANEL     Status: Abnormal   Collection Time   11/22/11 12:33 AM      Component Value Range   Sodium 135  135 - 145 mEq/L   Potassium 3.0 (*) 3.5 - 5.1 mEq/L   Chloride 103  96 - 112 mEq/L   CO2 23  19 - 32 mEq/L   Glucose, Bld 113 (*) 70 - 99 mg/dL   BUN 6  6 - 23 mg/dL   Creatinine, Ser 7.82  0.50 - 1.10 mg/dL   Calcium 8.2 (*) 8.4 - 10.5 mg/dL   Total Protein 6.0  6.0 - 8.3 g/dL   Albumin 2.4 (*) 3.5 - 5.2 g/dL   AST 13  0 - 37 U/L   ALT 7  0 - 35 U/L   Alkaline Phosphatase 98  39 - 117 U/L   Total Bilirubin 0.1 (*) 0.3 - 1.2 mg/dL   GFR calc non Af Amer >90  >90 mL/min   GFR calc Af Amer >90  >90 mL/min   Assessment and Plan  Term IUP Reactive airway disease. Continue albuterol inhaler at home.  Reassuring M/F status D/C home Plan to follow up on Medicaid S/S of pre-eclampsia reviewed.  Will return to MAU PRN.    Tawnya Crook 11/22/2011, 12:09 AM

## 2011-11-28 ENCOUNTER — Encounter (HOSPITAL_COMMUNITY): Payer: Self-pay | Admitting: *Deleted

## 2011-11-28 ENCOUNTER — Inpatient Hospital Stay (HOSPITAL_COMMUNITY): Payer: Medicaid Other

## 2011-11-28 ENCOUNTER — Inpatient Hospital Stay (HOSPITAL_COMMUNITY)
Admission: AD | Admit: 2011-11-28 | Discharge: 2011-11-28 | Disposition: A | Discharge status: Home / Self Care | Payer: Medicaid Other | Source: Ambulatory Visit | Attending: Obstetrics and Gynecology | Admitting: Obstetrics and Gynecology

## 2011-11-28 DIAGNOSIS — O479 False labor, unspecified: Secondary | ICD-10-CM | POA: Insufficient documentation

## 2011-11-28 IMAGING — US US OB LIMITED
1 series · 14 of 26 positions shown · non-contrast
Comparison: none

CLINICAL DATA: Nonreactive stress test.  Evaluate amniotic fluid
index.  38 weeks 2 days pregnant.

[Series 1: us fetal bpp w/o nonstress · non-contrast · 26 acquisitions, 14 frames shown]
[im 1/26]
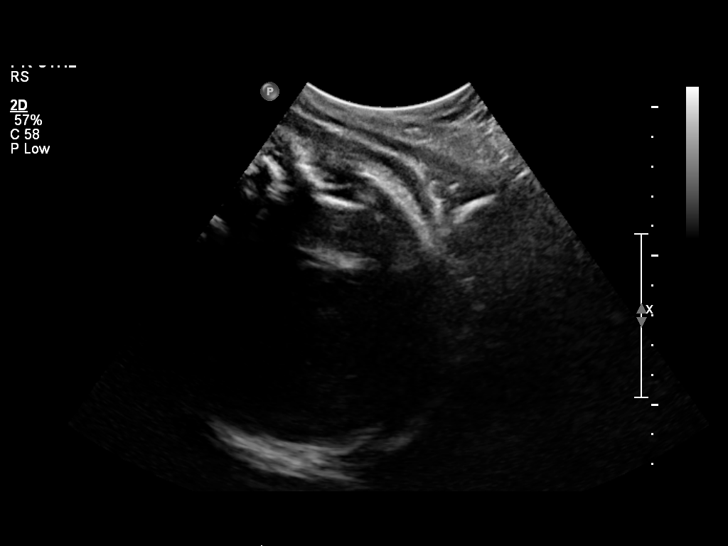
[im 3/26]
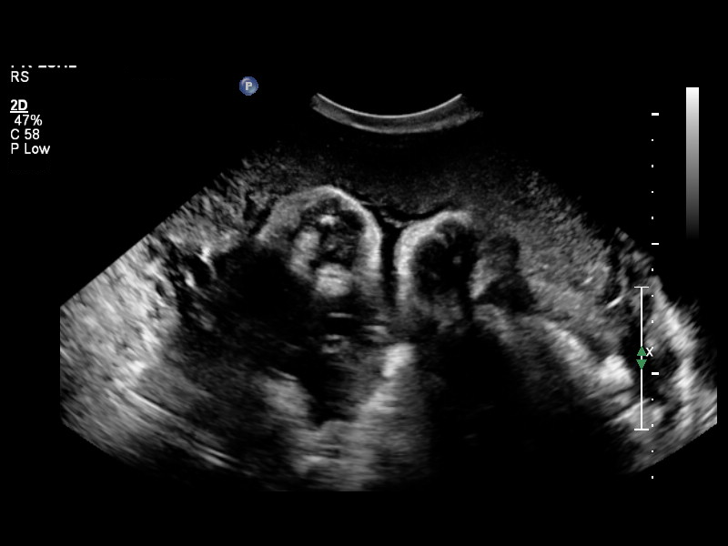
[im 5/26]
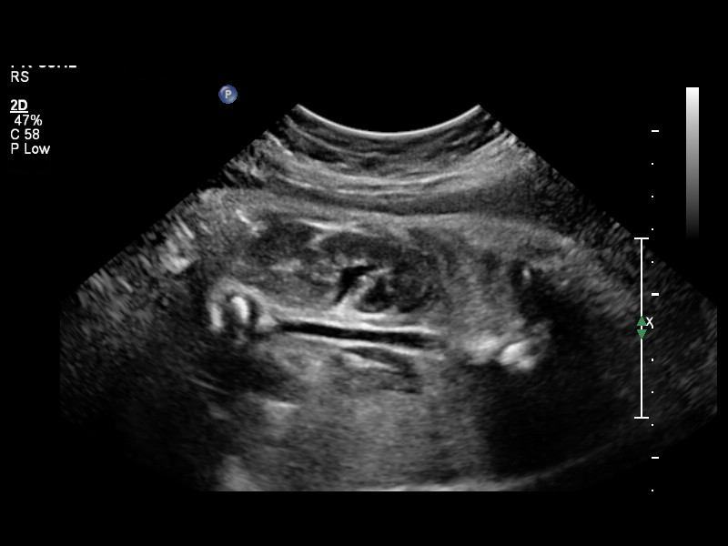
[im 7/26]
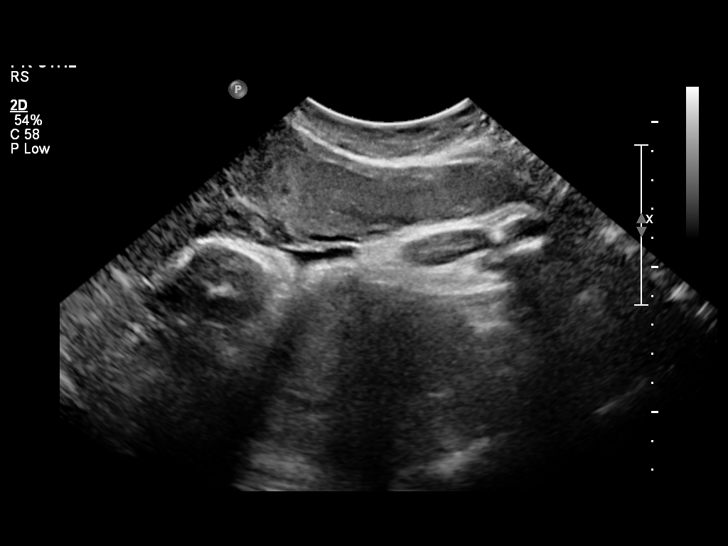
[im 9/26]
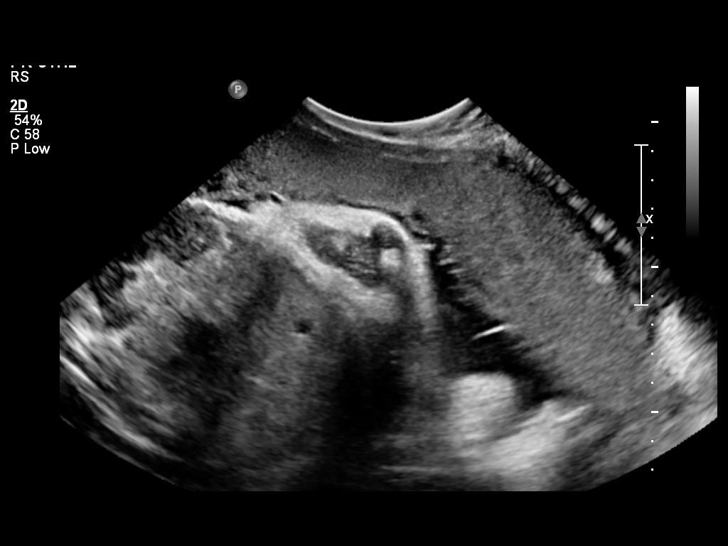
[im 11/26]
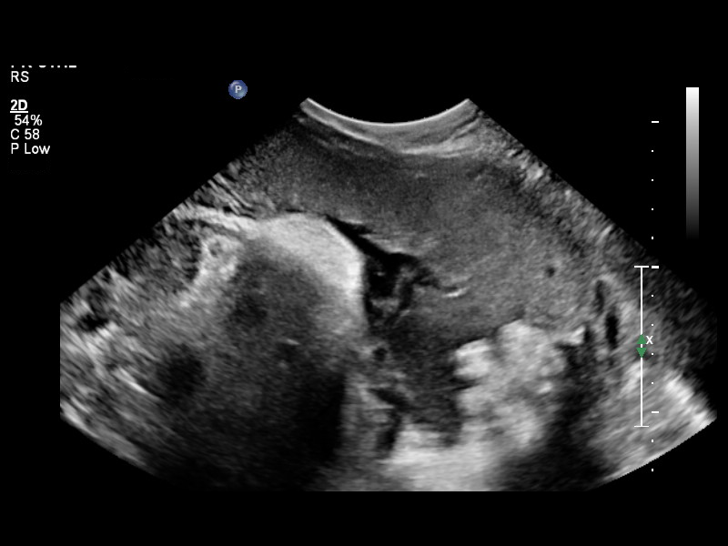
[im 13/26]
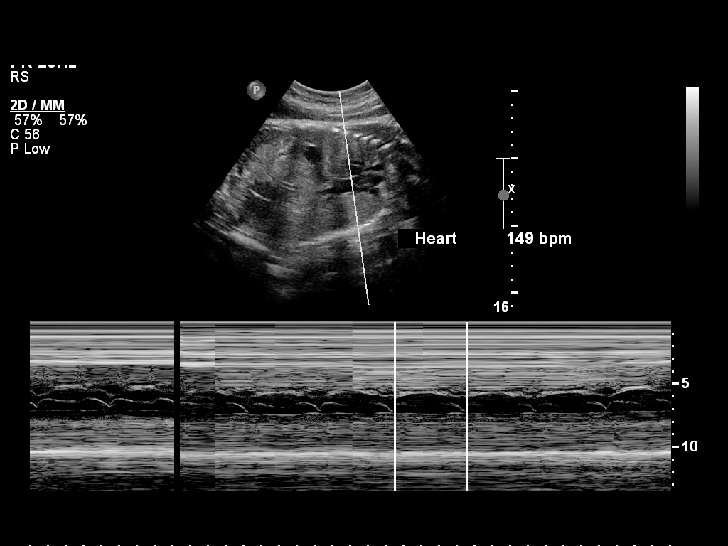
[im 14/26]
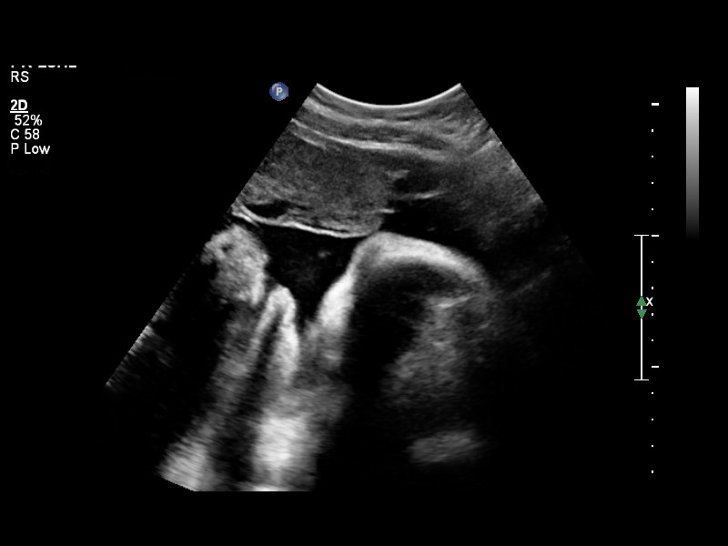
[im 16/26]
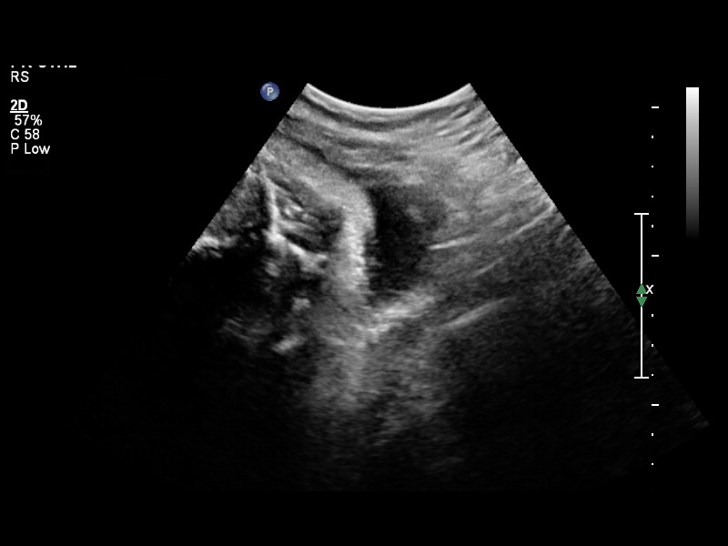
[im 18/26]
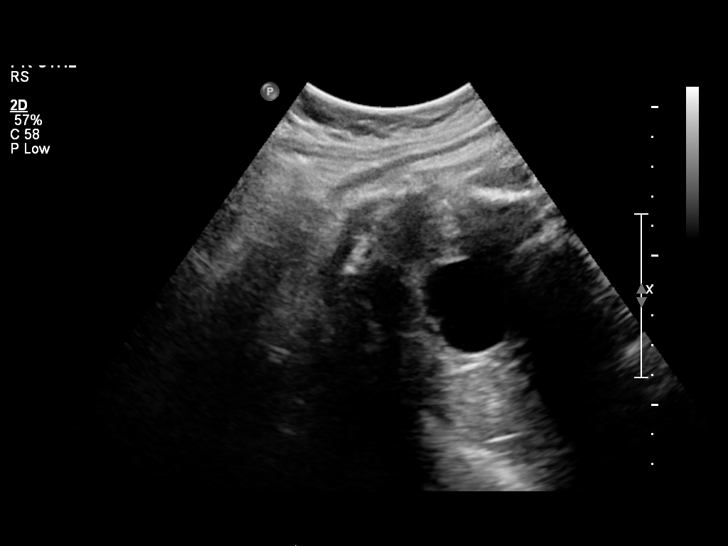
[im 20/26]
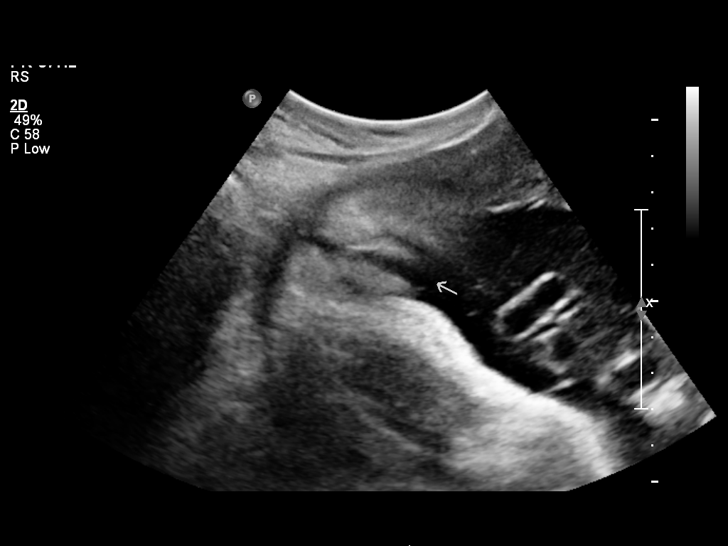
[im 22/26]
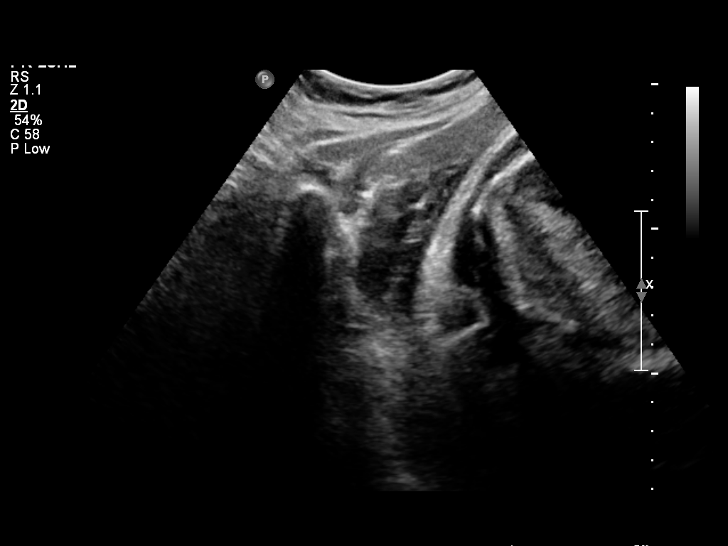
[im 24/26]
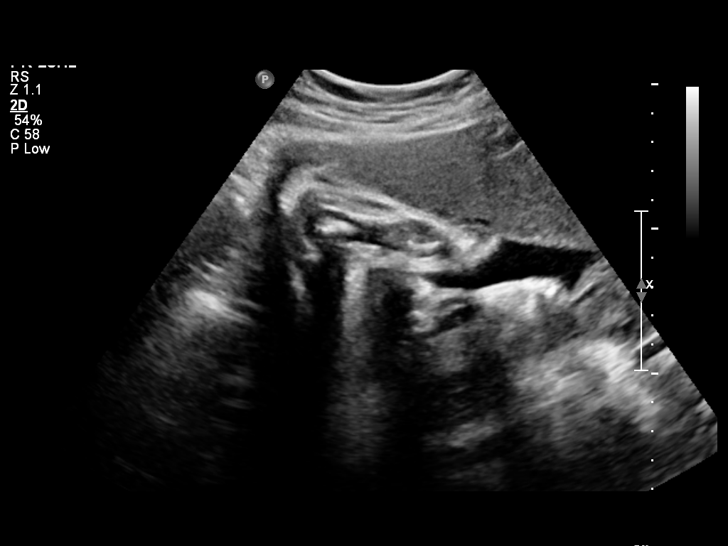
[im 26/26]
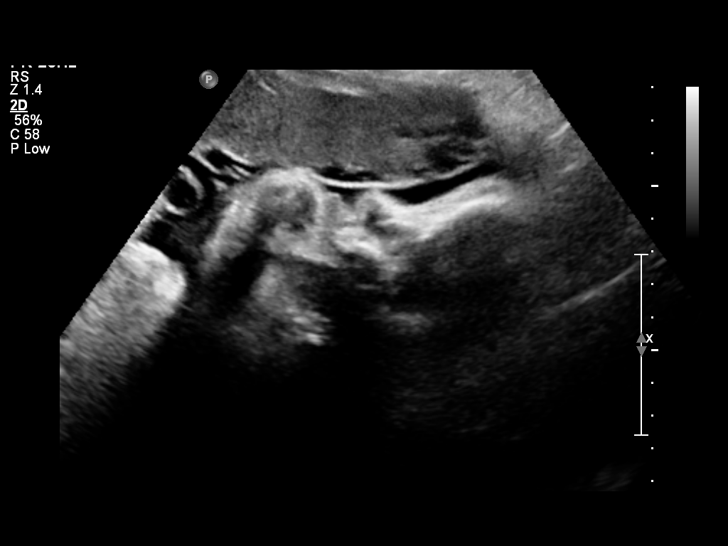

[14 of 26 positions shown; findings below may reference images not displayed]

LIMITED OBSTETRIC ULTRASOUND

Number of Fetuses: 1
Heart Rate: 149 bpm
Movement: Present
Presentation: cephalic
Placental Location: anterior
Previa: No
Amniotic Fluid (Subjective): Normal

Vertical Pocket 5.0 cm     AFI 10.7 cm  (5%ile = 7.3 cm; 95%ile =
23.9 cm)

BPD:  9.5 cm      38 w  6 d         EDC:  [DATE]

MATERNAL FINDINGS:
Cervix: Not evaluated.
Uterus/Adnexae:  Not visualized

BIOPHYSICAL PROFILE

Movement: 2                   Time: 3.0 minutes
Breathing: 2
Tone: 2
Amniotic Fluid: 2

Total Score: 8
IMPRESSION: 1.  Intrauterine pregnancy of 38 weeks 6 days.
2.  Fetal heart rate of 149 beats per minute.
3.  Normal amniotic fluid volume.
4.  Biophysical profile of [DATE]

Recommend followup with non-emergent complete OB 14+ wk US
examination for fetal biometric evaluation and anatomic survey if
not already performed.

## 2011-11-28 NOTE — MAU Note (Signed)
Patient states she is having contractions every 7 minutes. Denies bleeding or leaking and reports fetal movement.

## 2011-12-01 ENCOUNTER — Ambulatory Visit (INDEPENDENT_AMBULATORY_CARE_PROVIDER_SITE_OTHER): Payer: Medicaid Other | Admitting: Advanced Practice Midwife

## 2011-12-01 ENCOUNTER — Encounter (HOSPITAL_COMMUNITY): Payer: Self-pay | Admitting: *Deleted

## 2011-12-01 ENCOUNTER — Inpatient Hospital Stay (HOSPITAL_COMMUNITY)
Admission: AD | Admit: 2011-12-01 | Discharge: 2011-12-01 | Disposition: A | Discharge status: Home / Self Care | Payer: Medicaid Other | Source: Ambulatory Visit | Attending: Obstetrics & Gynecology | Admitting: Obstetrics & Gynecology

## 2011-12-01 ENCOUNTER — Encounter: Payer: Self-pay | Admitting: Advanced Practice Midwife

## 2011-12-01 VITALS — BP 128/92 | Temp 96.6°F | Wt 209.1 lb

## 2011-12-01 DIAGNOSIS — Z3490 Encounter for supervision of normal pregnancy, unspecified, unspecified trimester: Secondary | ICD-10-CM | POA: Insufficient documentation

## 2011-12-01 DIAGNOSIS — O99891 Other specified diseases and conditions complicating pregnancy: Secondary | ICD-10-CM | POA: Insufficient documentation

## 2011-12-01 DIAGNOSIS — R030 Elevated blood-pressure reading, without diagnosis of hypertension: Secondary | ICD-10-CM | POA: Insufficient documentation

## 2011-12-01 DIAGNOSIS — R51 Headache: Secondary | ICD-10-CM

## 2011-12-01 DIAGNOSIS — O26899 Other specified pregnancy related conditions, unspecified trimester: Secondary | ICD-10-CM

## 2011-12-01 DIAGNOSIS — R519 Headache, unspecified: Secondary | ICD-10-CM

## 2011-12-01 DIAGNOSIS — O0930 Supervision of pregnancy with insufficient antenatal care, unspecified trimester: Secondary | ICD-10-CM | POA: Insufficient documentation

## 2011-12-01 DIAGNOSIS — O169 Unspecified maternal hypertension, unspecified trimester: Secondary | ICD-10-CM

## 2011-12-01 DIAGNOSIS — Z3480 Encounter for supervision of other normal pregnancy, unspecified trimester: Secondary | ICD-10-CM

## 2011-12-01 DIAGNOSIS — O139 Gestational [pregnancy-induced] hypertension without significant proteinuria, unspecified trimester: Secondary | ICD-10-CM

## 2011-12-01 HISTORY — DX: Personal history of Methicillin resistant Staphylococcus aureus infection: Z86.14

## 2011-12-01 LAB — CBC
HCT: 30.5 % — ABNORMAL LOW (ref 36.0–46.0)
MCV: 78.4 fL (ref 78.0–100.0)
Platelets: 236 10*3/uL (ref 150–400)
RBC: 3.89 MIL/uL (ref 3.87–5.11)
WBC: 9.4 10*3/uL (ref 4.0–10.5)

## 2011-12-01 LAB — URINALYSIS, ROUTINE W REFLEX MICROSCOPIC
Glucose, UA: NEGATIVE mg/dL
Hgb urine dipstick: NEGATIVE
Specific Gravity, Urine: 1.015 (ref 1.005–1.030)
Urobilinogen, UA: 0.2 mg/dL (ref 0.0–1.0)

## 2011-12-01 LAB — URIC ACID: Uric Acid, Serum: 3.1 mg/dL (ref 2.4–7.0)

## 2011-12-01 LAB — POCT URINALYSIS DIP (DEVICE)
Bilirubin Urine: NEGATIVE
Glucose, UA: NEGATIVE mg/dL
Hgb urine dipstick: NEGATIVE
Ketones, ur: NEGATIVE mg/dL
Specific Gravity, Urine: 1.02 (ref 1.005–1.030)

## 2011-12-01 LAB — COMPREHENSIVE METABOLIC PANEL
Alkaline Phosphatase: 108 U/L (ref 39–117)
BUN: 4 mg/dL — ABNORMAL LOW (ref 6–23)
Chloride: 104 mEq/L (ref 96–112)
Creatinine, Ser: 0.54 mg/dL (ref 0.50–1.10)
GFR calc Af Amer: >90 mL/min (ref >90)
Glucose, Bld: 64 mg/dL — ABNORMAL LOW (ref 70–99)
Potassium: 3.2 mEq/L — ABNORMAL LOW (ref 3.5–5.1)
Total Bilirubin: 0.2 mg/dL — ABNORMAL LOW (ref 0.3–1.2)
Total Protein: 6.8 g/dL (ref 6.0–8.3)

## 2011-12-01 LAB — URINE MICROSCOPIC-ADD ON

## 2011-12-01 LAB — PROTEIN / CREATININE RATIO, URINE: Creatinine, Urine: 182.04 mg/dL

## 2011-12-01 MED ORDER — OXYCODONE-ACETAMINOPHEN 5-325 MG PO TABS
1.0000 | ORAL_TABLET | Freq: Once | ORAL | Status: AC
  Administered 2011-12-01: 1 via ORAL
  Filled 2011-12-01: qty 1

## 2011-12-01 MED ORDER — BUTALBITAL-APAP-CAFFEINE 50-325-40 MG PO TABS: 1.0000 | ORAL_TABLET | Freq: Four times a day (QID) | ORAL | Status: DC | PRN

## 2011-12-01 NOTE — MAU Note (Signed)
Sent from clinic, first appt today- BP was elevated.

## 2011-12-01 NOTE — Progress Notes (Signed)
P = 83  Pelvic pain and pressure

## 2011-12-01 NOTE — MAU Provider Note (Signed)
Attestation of Attending Supervision of Advanced Practitioner (CNM/NP): Evaluation and management procedures were performed by the Advanced Practitioner under my supervision and collaboration.  I have reviewed the Advanced Practitioner's note and chart, and I agree with the management and plan.  HARRAWAY-SMITH, Medea Deines 2:59 PM     

## 2011-12-01 NOTE — Progress Notes (Signed)
   Subjective:    Brittany [name] is a [id number] [redacted]w[redacted]d being seen today for her first obstetrical visit.  Her obstetrical history is significant for gestational diabetes with second pregnancy. Patient does intend to breast feed. Pregnancy history fully reviewed.  Patient reports pelvic pressure and daily h/a.  Filed Vitals:   12/01/11 0944  BP: 128/92  Temp: 96.6 F (35.9 C)  Weight: 94.847 kg (209 lb 1.6 oz)    HISTORY: OB History    Grav Para Term Preterm Abortions TAB SAB Ect Mult Living   3 2 2       2      # Outc Date GA Lbr Len/2nd Wgt Sex Del Anes PTL Lv   1 TRM 5/09    M SVD   Yes   2 TRM 7/12 [redacted]w[redacted]d -03:25 / 12:30 6lb13oz(3.09kg) M SVD None  Yes   Comments: none   3 CUR              Past Medical History  Diagnosis Date  . Asthma   . NVD (normal vaginal delivery) 08/30/2010  . No pertinent past medical history    Past Surgical History  Procedure Date  . No past surgeries    Family History  Problem Relation Age of Onset  . Anesthesia problems Neg Hx   . Diabetes Maternal Grandmother      Exam    Uterus:     Pelvic Exam:    Perineum: No Hemorrhoids, Normal Perineum   Vulva: normal, Bartholin's, Urethra, Skene's normal   Vagina:  normal mucosa, normal discharge   pH: ---   Cervix: multiparous appearance and no lesions   Adnexa: not evaluated   Bony Pelvis: average  System: Breast:  normal appearance, no masses or tenderness   Skin: normal coloration and turgor, no rashes    Neurologic: oriented, normal   Extremities: normal strength, tone, and muscle mass   HEENT PERRLA   Mouth/Teeth mucous membranes moist, pharynx normal without lesions   Neck supple and no masses   Cardiovascular: regular rate and rhythm   Respiratory:  appears well, vitals normal, no respiratory distress, acyanotic, normal RR, ear and throat exam is normal, neck free of mass or lymphadenopathy, chest clear, no wheezing, crepitations, rhonchi, normal symmetric air entry   Abdomen: soft, non-tender; bowel sounds normal; no masses,  no organomegaly   Urinary: urethral meatus normal      Assessment:    Pregnancy: G3P2002 1. Late prenatal care   2. Supervision of normal subsequent pregnancy   3. Elevated blood pressure complicating pregnancy, antepartum         Plan:     Initial labs drawn. Prenatal vitamins. Problem list reviewed and updated. Genetic Screening discussed--late to care.   Ultrasound discussed; fetal survey: Too late for detail scan, Limited U/S with normal AFI and BPP 1 week ago.  Follow up in 1 week. 50% of 30 min visit spent on counseling and coordination of care.   Sent to MAU for preeclampsia evaluation   LEFTWICH-KIRBY, LISA 12/01/2011

## 2011-12-01 NOTE — MAU Provider Note (Signed)
History     CSN: [id number]  Arrival date and time: 12/01/11 1121   First Provider Initiated Contact with Patient 12/01/11 1205      Chief Complaint  Patient presents with  . PIH eval from clinick    HPI   Brittany [name]  is a 21 y.o. G3P2002 at [redacted]w[redacted]d sent from clinic for PIH eval d/t mildly elevated BP and headache at [facility] visit today. States she has had a headache on and off for a few days, only minimal relief with tylenol. No vision change or abd pain. + fetal movement.   Past Medical History  Diagnosis Date  . Asthma   . NVD (normal vaginal delivery) 08/30/2010  . No pertinent past medical history   . Gestational diabetes   . Hx MRSA infection 2005    Past Surgical History  Procedure Date  . No past surgeries     Family History  Problem Relation Age of Onset  . Anesthesia problems Neg Hx   . Other Neg Hx   . Diabetes Maternal Grandmother     History  Substance Use Topics  . Smoking status: Former Smoker    Types: Cigarettes  . Smokeless tobacco: Never Used  . Alcohol Use: No    Allergies:  Allergies  Allergen Reactions  . Iodine Other (See Comments)    blistering  . Sulfonamide Derivatives Other (See Comments)    blistering    Prescriptions prior to admission  Medication Sig Dispense Refill  . acetaminophen (TYLENOL) 325 MG tablet Take 650 mg by mouth every 6 (six) hours as needed. For pain      . albuterol (PROVENTIL HFA;VENTOLIN HFA) 108 (90 BASE) MCG/ACT inhaler Inhale 2 puffs into the lungs every 6 (six) hours as needed. For shortness of breath      . Prenatal Vit-Fe Fumarate-FA (PRENATAL MULTIVITAMIN) TABS Take 1 tablet by mouth daily.        Review of Systems  Constitutional: Negative.   Respiratory: Negative.   Cardiovascular: Negative.   Gastrointestinal: Negative for nausea, vomiting, abdominal pain, diarrhea and constipation.  Genitourinary: Negative for dysuria, urgency, frequency, hematuria and flank pain.       Negative for  vaginal bleeding, cramping/contractions  Musculoskeletal: Negative.   Neurological: Positive for headaches.  Psychiatric/Behavioral: Negative.    Physical Exam   Blood pressure 130/85, pulse 85, temperature 97.5 F (36.4 C), temperature source Oral, resp. rate 20, last menstrual period 02/09/2011, unknown if currently breastfeeding.  Physical Exam  Nursing note and vitals reviewed. Constitutional: She is oriented to person, place, and time. She appears well-developed and well-nourished. No distress.  Cardiovascular: Normal rate.   Respiratory: Effort normal.  GI: Soft. There is no tenderness.  Musculoskeletal: Normal range of motion.  Neurological: She is alert and oriented to person, place, and time. She has normal reflexes.  Skin: Skin is warm and dry.  Psychiatric: She has a normal mood and affect.   EFM reactive  MAU Course  Procedures Filed Vitals:   12/01/11 1246 12/01/11 1301 12/01/11 1316 12/01/11 1420  BP: 130/82 129/86 133/98 120/83  Pulse: 78 77 84 77  Temp:      TempSrc:      Resp:       Results for orders placed during the hospital encounter of 12/01/11 (from the past 24 hour(s))  URINALYSIS, ROUTINE W REFLEX MICROSCOPIC     Status: Abnormal   Collection Time   12/01/11 11:00 AM      Component Value Range  Color, Urine YELLOW  YELLOW   APPearance CLEAR  CLEAR   Specific Gravity, Urine 1.015  1.005 - 1.030   pH 6.5  5.0 - 8.0   Glucose, UA NEGATIVE  NEGATIVE mg/dL   Hgb urine dipstick NEGATIVE  NEGATIVE   Bilirubin Urine NEGATIVE  NEGATIVE   Ketones, ur NEGATIVE  NEGATIVE mg/dL   Protein, ur NEGATIVE  NEGATIVE mg/dL   Urobilinogen, UA 0.2  0.0 - 1.0 mg/dL   Nitrite NEGATIVE  NEGATIVE   Leukocytes, UA TRACE (*) NEGATIVE  PROTEIN / CREATININE RATIO, URINE     Status: Normal   Collection Time   12/01/11 11:00 AM      Component Value Range   Creatinine, Urine 182.04     Total Protein, Urine 16.1     PROTEIN CREATININE RATIO 0.09  0.00 - 0.15  URINE  MICROSCOPIC-ADD ON     Status: Abnormal   Collection Time   12/01/11 11:00 AM      Component Value Range   Squamous Epithelial / LPF MANY (*) RARE   WBC, UA 3-6  <3 WBC/hpf   Bacteria, UA FEW (*) RARE   Urine-Other MUCOUS PRESENT    CBC     Status: Abnormal   Collection Time   12/01/11 11:50 AM      Component Value Range   WBC 9.4  4.0 - 10.5 K/uL   RBC 3.89  3.87 - 5.11 MIL/uL   Hemoglobin 10.6 (*) 12.0 - 15.0 g/dL   HCT 16.1 (*) 09.6 - 04.5 %   MCV 78.4  78.0 - 100.0 fL   MCH 27.2  26.0 - 34.0 pg   MCHC 34.8  30.0 - 36.0 g/dL   RDW 40.9  81.1 - 91.4 %   Platelets 236  150 - 400 K/uL  COMPREHENSIVE METABOLIC PANEL     Status: Abnormal   Collection Time   12/01/11 11:50 AM      Component Value Range   Sodium 137  135 - 145 mEq/L   Potassium 3.2 (*) 3.5 - 5.1 mEq/L   Chloride 104  96 - 112 mEq/L   CO2 25  19 - 32 mEq/L   Glucose, Bld 64 (*) 70 - 99 mg/dL   BUN 4 (*) 6 - 23 mg/dL   Creatinine, Ser 7.82  0.50 - 1.10 mg/dL   Calcium 8.1 (*) 8.4 - 10.5 mg/dL   Total Protein 6.8  6.0 - 8.3 g/dL   Albumin 2.6 (*) 3.5 - 5.2 g/dL   AST 17  0 - 37 U/L   ALT 7  0 - 35 U/L   Alkaline Phosphatase 108  39 - 117 U/L   Total Bilirubin 0.2 (*) 0.3 - 1.2 mg/dL   GFR calc non Af Amer >90  >90 mL/min   GFR calc Af Amer >90  >90 mL/min  URIC ACID     Status: Normal   Collection Time   12/01/11 11:50 AM      Component Value Range   Uric Acid, Serum 3.1  2.4 - 7.0 mg/dL     Assessment and Plan   1. Headache in pregnancy       Medication List     As of 12/01/2011  2:52 PM    START taking these medications         butalbital-acetaminophen-caffeine 50-325-40 MG per tablet   Commonly known as: FIORICET, ESGIC   Take 1-2 tablets by mouth every 6 (six) hours as needed for headache.  CONTINUE taking these medications         acetaminophen 325 MG tablet   Commonly known as: TYLENOL      albuterol 108 (90 BASE) MCG/ACT inhaler   Commonly known as: PROVENTIL HFA;VENTOLIN  HFA      prenatal multivitamin Tabs          Where to get your medications    These are the prescriptions that you need to pick up.   You may get these medications from any pharmacy.         butalbital-acetaminophen-caffeine 50-325-40 MG per tablet            Follow-up Information    Follow up with [facility]. In 1 week.   Contact information:   [address] [place] [address] [phone]           Kiowa Hollar 12/01/2011, 12:08 PM

## 2011-12-01 NOTE — MAU Note (Signed)
E-signature not working- hard copy signed/sent to med records.

## 2011-12-02 ENCOUNTER — Inpatient Hospital Stay (HOSPITAL_COMMUNITY)
Admission: AD | Admit: 2011-12-02 | Discharge: 2011-12-03 | Disposition: A | Discharge status: Home / Self Care | Payer: Medicaid Other | Source: Ambulatory Visit | Attending: Family Medicine | Admitting: Family Medicine

## 2011-12-02 DIAGNOSIS — Z3480 Encounter for supervision of other normal pregnancy, unspecified trimester: Secondary | ICD-10-CM

## 2011-12-02 DIAGNOSIS — O99891 Other specified diseases and conditions complicating pregnancy: Secondary | ICD-10-CM | POA: Insufficient documentation

## 2011-12-02 DIAGNOSIS — O368190 Decreased fetal movements, unspecified trimester, not applicable or unspecified: Secondary | ICD-10-CM | POA: Insufficient documentation

## 2011-12-02 DIAGNOSIS — L0291 Cutaneous abscess, unspecified: Secondary | ICD-10-CM

## 2011-12-02 DIAGNOSIS — IMO0002 Reserved for concepts with insufficient information to code with codable children: Secondary | ICD-10-CM | POA: Insufficient documentation

## 2011-12-02 NOTE — MAU Note (Signed)
Pt states woke up this morning with a spider bite on her left forearm and the back right side of her neck. No fetal movement since yesterday. Denies vaginal bleeding or leaking of fluid. Denies painful contractions.

## 2011-12-03 ENCOUNTER — Encounter (HOSPITAL_COMMUNITY): Payer: Self-pay

## 2011-12-03 MED ORDER — CLINDAMYCIN HCL 300 MG PO CAPS
300.0000 mg | ORAL_CAPSULE | Freq: Once | ORAL | Status: AC
  Administered 2011-12-03: 300 mg via ORAL
  Filled 2011-12-03: qty 1

## 2011-12-03 MED ORDER — CLINDAMYCIN HCL 300 MG PO CAPS: 300.0000 mg | ORAL_CAPSULE | Freq: Three times a day (TID) | ORAL | Status: DC

## 2011-12-03 MED ORDER — CHLORHEXIDINE GLUCONATE CLOTH 2 % EX PADS
1.0000 | MEDICATED_PAD | Freq: Once | CUTANEOUS | Status: DC
Start: 2011-12-03 — End: 2011-12-03

## 2011-12-03 NOTE — Progress Notes (Signed)
CM received a message from MAU at 1:26am that this self-pay patient was discharged home with a Rx for Clindamycin 300 mg three times daily for ten days. Patient is unable to afford to fill medication.  CM spoke with Tiffany in [facility] pharmacy. They will courier the medication from [facility] and have it here for pick up by patient today after 2:00pm.  Attempted to reach patient by phone at [phone]. Phone is not receiving calls. Called HR clinic and was given the emergency contact number of a friend, Lorin Picket [phone]). CM spoke with Mardella Layman and asked her to have the patient contact the CM as soon as possible. CM also left a text message at the patient's phone [phone]) to call ASAP.  CM spoke with Dianne in HR clinic about patient who has a follow-up appointment on 12/08/11. She placed a note in the patient's chart regarding pick up of her prescription from [facility] pharmacy.

## 2011-12-03 NOTE — MAU Provider Note (Signed)
  History     CSN: [id number]  Arrival date and time: 12/02/11 2335   None     Chief Complaint  Patient presents with  . Decreased Fetal Movement  . Insect Bite   HPI Patient woke this morning with itchiness at her right lateral neckline and her left forearm.  She noticed what seemed to be punctures and suspected insect bites.  Over the course of the day the bites have become somewhat swollen, and she has topically applied alcohol.  The bite on her left forearm has become enlarged, erythematous, hot to touch, and painful.  She denies fevers or other symptoms.  Also describes decreased fetal movement until she came to the ER, at which point she has felt kicks and return to normal.  Denies contractions, vaginal discharge, or fluid leakage.  OB History    Grav Para Term Preterm Abortions TAB SAB Ect Mult Living   3 2 2       2        Past Medical History  Diagnosis Date  . Asthma   . NVD (normal vaginal delivery) 08/30/2010  . Gestational diabetes   . Hx MRSA infection 2005    Past Surgical History  Procedure Date  . No past surgeries     Family History  Problem Relation Age of Onset  . Anesthesia problems Neg Hx   . Other Neg Hx   . Diabetes Maternal Grandmother     History  Substance Use Topics  . Smoking status: Former Smoker    Types: Cigarettes  . Smokeless tobacco: Never Used  . Alcohol Use: No    Allergies:  Allergies  Allergen Reactions  . Iodine Other (See Comments)    blistering  . Sulfonamide Derivatives Other (See Comments)    blistering    Prescriptions prior to admission  Medication Sig Dispense Refill  . acetaminophen (TYLENOL) 325 MG tablet Take 650 mg by mouth every 6 (six) hours as needed. For pain      . albuterol (PROVENTIL HFA;VENTOLIN HFA) 108 (90 BASE) MCG/ACT inhaler Inhale 2 puffs into the lungs every 6 (six) hours as needed. For shortness of breath      . Prenatal Vit-Fe Fumarate-FA (PRENATAL MULTIVITAMIN) TABS Take 1 tablet by  mouth daily.      . butalbital-acetaminophen-caffeine (FIORICET) 50-325-40 MG per tablet Take 1-2 tablets by mouth every 6 (six) hours as needed for headache.  20 tablet  0    ROS Gen: Denies fever CV: Denies CP Pulm: Denies SOB Abd: Denies Abdominal pain.  Denies diarrhea, nausea, vomiting. GU: Denies difficulty urinating   Physical Exam   Blood pressure 140/86, pulse 73, temperature 97.8 F (36.6 C), temperature source Oral, resp. rate 18, height 5\' 5"  (1.651 m), weight 95.709 kg (211 lb), last menstrual period 02/09/2011, unknown if currently breastfeeding.  Physical Exam Gen: Awake alert, NAD Abd: fundus appropriate for GA Ext: three puncture marks that have become swollen on right, lateral neckline, non-fluctuant, non-painful.  One purulent puncture mark on left forearm that is swollen, erythematous.  MAU Course  Procedures Incision and Drainage of abscess   Assessment and Plan  20yoF with history of MRSA infection presents with abscess  #LUE abscess - I&D, wound culture of abscess - empiric treatment with clinda, 10 day course - follow up with [facility] here in one week if symptoms do not improve  Sonia Side 12/03/2011, 12:23 AM

## 2011-12-04 LAB — CULTURE, BETA STREP (GROUP B ONLY)

## 2011-12-05 LAB — WOUND CULTURE

## 2011-12-07 ENCOUNTER — Inpatient Hospital Stay (HOSPITAL_COMMUNITY)
Admission: AD | Admit: 2011-12-07 | Discharge: 2011-12-09 | DRG: 774 | Disposition: A | Discharge status: Home / Self Care | Payer: Medicaid Other | Source: Ambulatory Visit | Attending: Obstetrics & Gynecology | Admitting: Obstetrics & Gynecology

## 2011-12-07 ENCOUNTER — Encounter (HOSPITAL_COMMUNITY): Payer: Self-pay | Admitting: Family

## 2011-12-07 DIAGNOSIS — O139 Gestational [pregnancy-induced] hypertension without significant proteinuria, unspecified trimester: Principal | ICD-10-CM | POA: Diagnosis present

## 2011-12-07 DIAGNOSIS — IMO0001 Reserved for inherently not codable concepts without codable children: Secondary | ICD-10-CM

## 2011-12-07 DIAGNOSIS — Z3480 Encounter for supervision of other normal pregnancy, unspecified trimester: Secondary | ICD-10-CM

## 2011-12-07 DIAGNOSIS — O169 Unspecified maternal hypertension, unspecified trimester: Secondary | ICD-10-CM

## 2011-12-07 HISTORY — DX: Gonococcal infection, unspecified: A54.9

## 2011-12-07 HISTORY — DX: Trichomoniasis, unspecified: A59.9

## 2011-12-07 HISTORY — DX: Chlamydial infection, unspecified: A74.9

## 2011-12-07 LAB — COMPREHENSIVE METABOLIC PANEL
AST: 19 U/L (ref 0–37)
Albumin: 2.6 g/dL — ABNORMAL LOW (ref 3.5–5.2)
Alkaline Phosphatase: 116 U/L (ref 39–117)
Chloride: 103 mEq/L (ref 96–112)
Potassium: 3.5 mEq/L (ref 3.5–5.1)
Sodium: 136 mEq/L (ref 135–145)
Total Bilirubin: 0.3 mg/dL (ref 0.3–1.2)

## 2011-12-07 LAB — RPR: RPR Ser Ql: NONREACTIVE

## 2011-12-07 LAB — TYPE AND SCREEN: Antibody Screen: NEGATIVE

## 2011-12-07 LAB — CBC
Platelets: 236 10*3/uL (ref 150–400)
RBC: 4.02 MIL/uL (ref 3.87–5.11)
WBC: 10.2 10*3/uL (ref 4.0–10.5)

## 2011-12-07 LAB — PROTEIN / CREATININE RATIO, URINE
Creatinine, Urine: 106.19 mg/dL
Protein Creatinine Ratio: 0.12 (ref 0.00–0.15)
Total Protein, Urine: 13 mg/dL

## 2011-12-07 MED ORDER — DIBUCAINE 1 % RE OINT: 1.0000 "application " | TOPICAL_OINTMENT | RECTAL | Status: DC | PRN

## 2011-12-07 MED ORDER — LANOLIN HYDROUS EX OINT: 1.0000 "application " | TOPICAL_OINTMENT | CUTANEOUS | Status: DC | PRN

## 2011-12-07 MED ORDER — MEASLES, MUMPS & RUBELLA VAC ~~LOC~~ INJ: 0.5000 mL | INJECTION | Freq: Once | SUBCUTANEOUS | Status: DC

## 2011-12-07 MED ORDER — LABETALOL HCL 5 MG/ML IV SOLN: 20.0000 mg | INTRAVENOUS | Status: DC | PRN

## 2011-12-07 MED ORDER — OXYTOCIN 40 UNITS IN LACTATED RINGERS INFUSION - SIMPLE MED: 62.5000 mL/h | INTRAVENOUS | Status: DC

## 2011-12-07 MED ORDER — NALBUPHINE SYRINGE 5 MG/0.5 ML
10.0000 mg | INJECTION | Freq: Once | INTRAMUSCULAR | Status: AC | PRN
  Administered 2011-12-07: 10 mg via INTRAMUSCULAR
  Filled 2011-12-07: qty 1

## 2011-12-07 MED ORDER — OXYTOCIN 40 UNITS IN LACTATED RINGERS INFUSION - SIMPLE MED
62.5000 mL/h | INTRAVENOUS | Status: DC
  Administered 2011-12-07: 62.5 mL/h via INTRAVENOUS
  Filled 2011-12-07: qty 1000

## 2011-12-07 MED ORDER — INFLUENZA VIRUS VACC SPLIT PF IM SUSP
0.5000 mL | INTRAMUSCULAR | Status: AC
  Administered 2011-12-08: 0.5 mL via INTRAMUSCULAR
  Filled 2011-12-07: qty 0.5

## 2011-12-07 MED ORDER — FERROUS SULFATE 325 (65 FE) MG PO TABS
325.0000 mg | ORAL_TABLET | Freq: Two times a day (BID) | ORAL | Status: DC
  Administered 2011-12-08 – 2011-12-09 (×3): 325 mg via ORAL
  Filled 2011-12-07 (×3): qty 1

## 2011-12-07 MED ORDER — SIMETHICONE 80 MG PO CHEW: 80.0000 mg | CHEWABLE_TABLET | ORAL | Status: DC | PRN

## 2011-12-07 MED ORDER — TETANUS-DIPHTH-ACELL PERTUSSIS 5-2.5-18.5 LF-MCG/0.5 IM SUSP
0.5000 mL | Freq: Once | INTRAMUSCULAR | Status: DC
  Filled 2011-12-07: qty 0.5

## 2011-12-07 MED ORDER — MAGNESIUM HYDROXIDE 400 MG/5ML PO SUSP
30.0000 mL | ORAL | Status: DC | PRN
  Filled 2011-12-07: qty 30

## 2011-12-07 MED ORDER — IBUPROFEN 600 MG PO TABS: 600.0000 mg | ORAL_TABLET | Freq: Four times a day (QID) | ORAL | Status: DC | PRN

## 2011-12-07 MED ORDER — IBUPROFEN 600 MG PO TABS
600.0000 mg | ORAL_TABLET | Freq: Four times a day (QID) | ORAL | Status: DC
  Administered 2011-12-07 – 2011-12-09 (×8): 600 mg via ORAL
  Filled 2011-12-07 (×8): qty 1

## 2011-12-07 MED ORDER — LABETALOL HCL 5 MG/ML IV SOLN
20.0000 mg | INTRAVENOUS | Status: DC | PRN
  Filled 2011-12-07: qty 4

## 2011-12-07 MED ORDER — FENTANYL CITRATE 0.05 MG/ML IJ SOLN
100.0000 ug | INTRAMUSCULAR | Status: DC | PRN
  Administered 2011-12-07 (×3): 100 ug via INTRAVENOUS
  Filled 2011-12-07 (×3): qty 2

## 2011-12-07 MED ORDER — OXYCODONE-ACETAMINOPHEN 5-325 MG PO TABS
1.0000 | ORAL_TABLET | ORAL | Status: DC | PRN
  Administered 2011-12-07 – 2011-12-08 (×2): 2 via ORAL
  Administered 2011-12-08: 1 via ORAL
  Administered 2011-12-08: 2 via ORAL
  Administered 2011-12-09: 1 via ORAL
  Administered 2011-12-09 (×2): 2 via ORAL
  Filled 2011-12-07: qty 1
  Filled 2011-12-07 (×4): qty 2
  Filled 2011-12-07: qty 1
  Filled 2011-12-07: qty 2
  Filled 2011-12-07: qty 1

## 2011-12-07 MED ORDER — MAGNESIUM SULFATE 40 G IN LACTATED RINGERS - SIMPLE
2.0000 g/h | INTRAVENOUS | Status: AC
  Administered 2011-12-08: 2 g/h via INTRAVENOUS
  Filled 2011-12-07 (×2): qty 500

## 2011-12-07 MED ORDER — BENZOCAINE-MENTHOL 20-0.5 % EX AERO
1.0000 "application " | INHALATION_SPRAY | CUTANEOUS | Status: DC | PRN
  Filled 2011-12-07: qty 56

## 2011-12-07 MED ORDER — TERBUTALINE SULFATE 1 MG/ML IJ SOLN: 0.2500 mg | Freq: Once | INTRAMUSCULAR | Status: DC | PRN

## 2011-12-07 MED ORDER — SENNOSIDES-DOCUSATE SODIUM 8.6-50 MG PO TABS
2.0000 | ORAL_TABLET | Freq: Every day | ORAL | Status: DC
  Administered 2011-12-07 – 2011-12-08 (×2): 2 via ORAL

## 2011-12-07 MED ORDER — MISOPROSTOL 200 MCG PO TABS: 1000.0000 ug | ORAL_TABLET | Freq: Once | ORAL | Status: DC

## 2011-12-07 MED ORDER — PRENATAL MULTIVITAMIN CH
1.0000 | ORAL_TABLET | Freq: Every day | ORAL | Status: DC
  Administered 2011-12-07 – 2011-12-09 (×3): 1 via ORAL
  Filled 2011-12-07 (×3): qty 1

## 2011-12-07 MED ORDER — MISOPROSTOL 200 MCG PO TABS
ORAL_TABLET | ORAL | Status: AC
  Administered 2011-12-07: 800 ug via RECTAL
  Filled 2011-12-07: qty 5

## 2011-12-07 MED ORDER — ACETAMINOPHEN 325 MG PO TABS: 650.0000 mg | ORAL_TABLET | ORAL | Status: DC | PRN

## 2011-12-07 MED ORDER — CITRIC ACID-SODIUM CITRATE 334-500 MG/5ML PO SOLN: 30.0000 mL | ORAL | Status: DC | PRN

## 2011-12-07 MED ORDER — LIDOCAINE HCL (PF) 1 % IJ SOLN
30.0000 mL | INTRAMUSCULAR | Status: DC | PRN
  Filled 2011-12-07: qty 30

## 2011-12-07 MED ORDER — OXYTOCIN 40 UNITS IN LACTATED RINGERS INFUSION - SIMPLE MED
1.0000 m[IU]/min | INTRAVENOUS | Status: DC
  Administered 2011-12-07: 2 m[IU]/min via INTRAVENOUS
  Filled 2011-12-07: qty 1000

## 2011-12-07 MED ORDER — ONDANSETRON HCL 4 MG PO TABS: 4.0000 mg | ORAL_TABLET | ORAL | Status: DC | PRN

## 2011-12-07 MED ORDER — PNEUMOCOCCAL VAC POLYVALENT 25 MCG/0.5ML IJ INJ
0.5000 mL | INJECTION | INTRAMUSCULAR | Status: AC
  Administered 2011-12-08: 0.5 mL via INTRAMUSCULAR
  Filled 2011-12-07: qty 0.5

## 2011-12-07 MED ORDER — ONDANSETRON HCL 4 MG/2ML IJ SOLN
4.0000 mg | Freq: Four times a day (QID) | INTRAMUSCULAR | Status: DC | PRN
  Administered 2011-12-07: 4 mg via INTRAVENOUS
  Filled 2011-12-07: qty 2

## 2011-12-07 MED ORDER — OXYTOCIN BOLUS FROM INFUSION
500.0000 mL | INTRAVENOUS | Status: DC
  Filled 2011-12-07: qty 500

## 2011-12-07 MED ORDER — LACTATED RINGERS IV SOLN
INTRAVENOUS | Status: DC
  Administered 2011-12-07: 10:00:00 via INTRAVENOUS

## 2011-12-07 MED ORDER — ONDANSETRON HCL 4 MG/2ML IJ SOLN: 4.0000 mg | INTRAMUSCULAR | Status: DC | PRN

## 2011-12-07 MED ORDER — ZOLPIDEM TARTRATE 5 MG PO TABS: 5.0000 mg | ORAL_TABLET | Freq: Every evening | ORAL | Status: DC | PRN

## 2011-12-07 MED ORDER — OXYTOCIN 40 UNITS IN LACTATED RINGERS INFUSION - SIMPLE MED: 1.0000 m[IU]/min | INTRAVENOUS | Status: DC

## 2011-12-07 MED ORDER — MAGNESIUM SULFATE BOLUS VIA INFUSION
6.0000 g | Freq: Once | INTRAVENOUS | Status: AC
  Administered 2011-12-07: 6 g via INTRAVENOUS
  Filled 2011-12-07: qty 500

## 2011-12-07 MED ORDER — OXYCODONE-ACETAMINOPHEN 5-325 MG PO TABS: 1.0000 | ORAL_TABLET | ORAL | Status: DC | PRN

## 2011-12-07 MED ORDER — DIPHENHYDRAMINE HCL 25 MG PO CAPS: 25.0000 mg | ORAL_CAPSULE | Freq: Four times a day (QID) | ORAL | Status: DC | PRN

## 2011-12-07 MED ORDER — MAGNESIUM SULFATE 40 G IN LACTATED RINGERS - SIMPLE
2.0000 g/h | INTRAVENOUS | Status: DC
  Filled 2011-12-07: qty 500

## 2011-12-07 MED ORDER — SODIUM CHLORIDE 0.9 % IV SOLN
3.0000 g | Freq: Once | INTRAVENOUS | Status: AC
  Administered 2011-12-07: 3 g via INTRAVENOUS
  Filled 2011-12-07: qty 3

## 2011-12-07 MED ORDER — LACTATED RINGERS IV SOLN: 500.0000 mL | INTRAVENOUS | Status: DC | PRN

## 2011-12-07 MED ORDER — WITCH HAZEL-GLYCERIN EX PADS: 1.0000 "application " | MEDICATED_PAD | CUTANEOUS | Status: DC | PRN

## 2011-12-07 NOTE — Progress Notes (Signed)
Patient ID: Brittany [name], female   DOB: September 19, 1990, 21 y.o.   MRN: [id number]  Called to room by RN for continued oozing and occasional small gushes of lochia. After routine bolus of 250 ml pitocin in LR, 40 Unit bad of pitocin hung. Fundus firm, U/3. ~350 ml urine drained via Crede maneuver. Bleeding continued. Pt medicated w/ Nubain 10 mg. Two 3-5 cm clots removed from cervix. 2  2 cm pieces of placenta and piece of membranes removed from anterior wall of lower uterine segment. Fundus  Contracted. Unable to explore fundus. Bleeding improving, but will call Dr. Macon Large to assess.   [place], [place] 12/07/2011 3:53 PM

## 2011-12-07 NOTE — H&P (Signed)
Brittany [name] is a 21 y.o. female presenting for SOL at term  Patient has had contractions since last evening, becoming more frequent.  5-34mins apart, lasting one minute.  Possible fluid leakage.  Reassuring baby movement in the past hour.  No vaginal bleeding.  Maternal Medical History:  Prenatal complications: No hypertension, oligohydramnios, polyhydramnios or pre-eclampsia.     OB History    Grav Para Term Preterm Abortions TAB SAB Ect Mult Living   3 2 2       2      Past Medical History  Diagnosis Date  . Asthma   . NVD (normal vaginal delivery) 08/30/2010  . Gestational diabetes   . Hx MRSA infection 2005  . Trichimoniasis   . Chlamydia   . Gonorrhea    Past Surgical History  Procedure Date  . No past surgeries    Family History: family history includes Diabetes in her maternal grandmother.  There is no history of Anesthesia problems and Other. Social History:  reports that she has quit smoking. Her smoking use included Cigarettes. She has never used smokeless tobacco. She reports that she does not drink alcohol or use illicit drugs.   Prenatal Transfer Tool  Maternal Diabetes: No Genetic Screening: Normal Maternal Ultrasounds/Referrals: Normal Fetal Ultrasounds or other Referrals:  None Maternal Substance Abuse:  No Significant Maternal Medications:  None Significant Maternal Lab Results:  None Other Comments:  None  Review of Systems  Eyes:       Floaters  Respiratory: Negative for shortness of breath.   Cardiovascular: Negative for chest pain.  Gastrointestinal: Negative for abdominal pain.  Neurological: Positive for headaches. Negative for seizures.    Dilation: 5 Effacement (%): 50 Station: Ballotable Exam by:: Derenda Fennel, MD L. [employer], [employer] student  Blood pressure 145/94, pulse 77, temperature 98 F (36.7 C), temperature source Oral, resp. rate 18, height 5\' 5"  (1.651 m), weight 96.616 kg (213 lb), last menstrual period 02/09/2011, unknown  if currently breastfeeding.  Exam Physical Exam  Dilation: 5 Effacement (%): 50 Cervical Position: Posterior Station: Ballotable Presentation: Vertex Exam by:: Derenda Fennel, MD L. [employer], [employer] student    Prenatal labs: ABO, Rh:  O Atibody:  neg Rubella:  IMM RPR:   NR HBsAg:   neg HIV:   NR GBS: Negative (10/16 0000)   Assessment/Plan: 20yoF G3P2002@[redacted]w[redacted]d  presents with SOL at term  #SOL - contractions irregular, q3-29mins - dilation 5cm, changed from previous exam of 1.5 - amnisure pending - Patient would like IV pain medication during labor  #HTN - PIH labs pending - patient symptomatic (vision changes), starting Mag - IV labetalol PRN for critical HTN   Sonia Side 12/07/2011, 9:08 AM

## 2011-12-07 NOTE — MAU Note (Signed)
Pt states tarted leaking fluid at 0400 this am; contracting about every 7 minutes.

## 2011-12-07 NOTE — MAU Note (Addendum)
States had irregular ctx's last night.  Have been every since 0300, noted leaking since 0430.  "feels like was peeing on herself". Has had BP elevations recently.

## 2011-12-07 NOTE — Progress Notes (Signed)
Patient ID: JEARLENE SMUCK, female   DOB: [birth year], 21 y.o.   MRN: [id number] Dr. Macon Large at [facility]. I&O cath. Performed uterine exploration. Minimal blood/clots removed. Uterus clamped down. Pitocin infusion continues. Fundus firm. Will CTO bleeding closely. Pt informed that if bleeding increases again she may need to go to OR for D&C. Unasyn ordered for endometritis prophylaxis.   [place], [place] 12/07/2011 4:56 PM

## 2011-12-07 NOTE — Progress Notes (Signed)
Brittany [name] is a 21 y.o. G3P2002 at [redacted]w[redacted]d.  Subjective: Requesting IV pain meds.  Objective: BP 142/99  Pulse 84  Temp 98.2 F (36.8 C) (Oral)  Resp 20  Ht 5\' 5"  (1.651 m)  Wt 96.616 kg (213 lb)  BMI 35.44 kg/m2  SpO2 98%  LMP 02/09/2011 Not Breastfeeding Patient Vitals for the past 24 hrs:  BP Temp Temp src Pulse Resp SpO2 Height Weight  12/07/11 1044 - - - 84  - - - -  12/07/11 1043 142/99 mmHg - - 87  - 98 % - -  12/07/11 1038 - - - 80  - 99 % - -  12/07/11 1035 137/91 mmHg - - 88  - - - -  12/07/11 1033 - - - 86  - 98 % - -  12/07/11 1028 - - - 80  - 98 % - -  12/07/11 1024 - - - 84  - - - -  12/07/11 1023 139/103 mmHg - - 83  - 98 % - -  12/07/11 1018 - - - 89  - 98 % - -  12/07/11 1014 - - - 94  - - - -  12/07/11 1013 128/88 mmHg - - 91  - 99 % - -  12/07/11 1004 - - - 87  - - - -  12/07/11 1003 135/91 mmHg - - 86  - 98 % - -  12/07/11 0958 - - - 78  - 98 % - -  12/07/11 0954 - - - 75  - - - -  12/07/11 0953 136/93 mmHg - - 74  - 99 % - -  12/07/11 0948 - - - 72  - 100 % - -  12/07/11 0943 151/98 mmHg - - 75  - 98 % - -  12/07/11 0942 - - - 71  - - - -  12/07/11 0930 137/100 mmHg 98.2 F (36.8 C) - 68  20  - - -  12/07/11 0902 148/107 mmHg - - 70  - - - -  12/07/11 0847 134/87 mmHg - - 67  - - - -  12/07/11 0845 134/87 mmHg - - 65  - - - -  12/07/11 0838 128/109 mmHg - - 120  - - - -  12/07/11 0823 149/98 mmHg - - 75  - - - -  12/07/11 0821 149/98 mmHg - - 75  - - - -  12/07/11 0817 141/104 mmHg - - 74  - - - -  12/07/11 0815 141/104 mmHg - - 74  - - - -  12/07/11 0800 139/95 mmHg - - 71  - - - -  12/07/11 0758 145/94 mmHg - - 77  - - - -  12/07/11 0752 144/105 mmHg 98 F (36.7 C) Oral 92  18  - 5\' 5"  (1.651 m) 96.616 kg (213 lb)  12/07/11 0751 144/105 mmHg - - 84  - - - -   FHT:  FHR: 130 bpm, variability: moderate,  accelerations:  Present,  decelerations:  Absent UC:   irregular, every 9 minutes, moderate SVE:   Dilation: 5 Effacement (%):  50 Station: Ballotable Exam by:: Derenda Fennel, MD L. [employer], [employer] student   Labs: Lab Results  Component Value Date   WBC 10.2 12/07/2011   HGB 10.9* 12/07/2011   HCT 31.5* 12/07/2011   MCV 78.4 12/07/2011   PLT 236 12/07/2011    Assessment / Plan: Eary labor Gest HTN vs Pre-E  Labor: No progress x  2 hours. Will augment w/ pitocin. Preeclampsia:  on magnesium sulfate, no signs or symptoms of toxicity and labs stable Fetal Wellbeing:  Category I Pain Control:  Fentanyl I/D:  n/a Anticipated MOD:  NSVD. Dr. Macon Large aware of BP's, Mag, Labetalol ordered PRN.  Brittany [name] 12/07/2011, 10:51 AM

## 2011-12-07 NOTE — H&P (Signed)
Attestation of Attending Supervision of Resident: Evaluation and management procedures were performed by the Family Medicine Resident under my supervision.  I have seen and examined the patient, reviewed the resident's note and chart, and I agree with the management and plan.  UGONNA  ANYANWU, MD, FACOG Attending Obstetrician & Gynecologist Faculty Practice, [facility] 

## 2011-12-08 ENCOUNTER — Encounter: Payer: Self-pay | Admitting: Advanced Practice Midwife

## 2011-12-08 ENCOUNTER — Encounter (HOSPITAL_COMMUNITY): Payer: Self-pay | Admitting: *Deleted

## 2011-12-08 LAB — CBC
HCT: 29.1 % — ABNORMAL LOW (ref 36.0–46.0)
MCH: 27.2 pg (ref 26.0–34.0)
MCHC: 35.1 g/dL (ref 30.0–36.0)
MCV: 77.6 fL — ABNORMAL LOW (ref 78.0–100.0)
RDW: 13.5 % (ref 11.5–15.5)

## 2011-12-08 MED ORDER — LACTATED RINGERS IV SOLN: INTRAVENOUS | Status: DC

## 2011-12-08 MED ORDER — ALBUTEROL SULFATE HFA 108 (90 BASE) MCG/ACT IN AERS
2.0000 | INHALATION_SPRAY | Freq: Four times a day (QID) | RESPIRATORY_TRACT | Status: DC | PRN
  Administered 2011-12-08: 2 via RESPIRATORY_TRACT
  Filled 2011-12-08: qty 6.7

## 2011-12-08 NOTE — Progress Notes (Signed)
Post Partum Day #1 Subjective: voiding and tolerating PO; had H/A earlier that was relieved by Percocet; denies RUQ pain or VD; breastfeeding going well; desires Depo for contraception  Objective: Blood pressure 135/70, pulse 83, temperature 97.6 F (36.4 C), temperature source Oral, resp. rate 18, height 5\' 5"  (1.651 m), weight 94.303 kg (207 lb 14.4 oz), last menstrual period 02/09/2011, SpO2 100.00%, unknown if currently breastfeeding.  Physical Exam:  General: alert and mild distress Lochia: appropriate Uterine Fundus: firm DVT Evaluation: No evidence of DVT seen on physical exam; no edema of LE   Basename 12/08/11 0525 12/07/11 0940  HGB 10.2* 10.9*  HCT 29.1* 31.5*    Assessment/Plan: Plan for discharge tomorrow; Will keep mag going until 2pm and then d/c   LOS: 1 day   Brittany [name] 12/08/2011, 7:26 AM

## 2011-12-08 NOTE — Progress Notes (Signed)
2 Percocets for post-partum soreness/ cramping and H/A 6/10

## 2011-12-08 NOTE — Progress Notes (Signed)
Ur chart review completed.  

## 2011-12-09 MED ORDER — OXYCODONE-ACETAMINOPHEN 5-325 MG PO TABS: 1.0000 | ORAL_TABLET | ORAL | Status: DC | PRN

## 2011-12-09 MED ORDER — IBUPROFEN 600 MG PO TABS: 600.0000 mg | ORAL_TABLET | Freq: Four times a day (QID) | ORAL | Status: DC

## 2011-12-09 MED ORDER — WITCH HAZEL-GLYCERIN EX PADS: 1.0000 "application " | MEDICATED_PAD | CUTANEOUS | Status: DC | PRN

## 2011-12-09 MED ORDER — LANOLIN HYDROUS EX OINT: 1.0000 "application " | TOPICAL_OINTMENT | CUTANEOUS | Status: DC | PRN

## 2011-12-09 MED ORDER — DIBUCAINE 1 % RE OINT: 1.0000 "application " | TOPICAL_OINTMENT | RECTAL | Status: DC | PRN

## 2011-12-09 MED ORDER — FERROUS SULFATE 325 (65 FE) MG PO TABS: 325.0000 mg | ORAL_TABLET | Freq: Two times a day (BID) | ORAL | Status: DC

## 2011-12-09 MED ORDER — BENZOCAINE-MENTHOL 20-0.5 % EX AERO: 1.0000 "application " | INHALATION_SPRAY | CUTANEOUS | Status: DC | PRN

## 2011-12-09 NOTE — Discharge Summary (Signed)
Attestation of Attending Supervision of Advanced Practitioner (CNM/NP): Evaluation and management procedures were performed by the Advanced Practitioner under my supervision and collaboration.  I have reviewed the Advanced Practitioner's note and chart, and I agree with the management and plan.  Brittany [name] 12/09/2011 8:47 AM

## 2011-12-09 NOTE — Discharge Summary (Signed)
Obstetric Discharge Summary Reason for Admission: onset of labor Prenatal Procedures: ultrasound Intrapartum Procedures: spontaneous vaginal delivery, on magnesium for preeclampsia Postpartum Procedures: magnesium Complications-Operative and Postpartum: hemorrhage Hemoglobin  Date Value Range Status  12/08/2011 10.2* 12.0 - 15.0 g/dL Final     HCT  Date Value Range Status  12/08/2011 29.1* 36.0 - 46.0 % Final    Physical Exam:  Temp:  [97.7 F (36.5 C)-98 F (36.7 C)] 98 F (36.7 C) (10/24 1610) Pulse Rate:  [60-85] 68  (10/24 0608) Resp:  [16-18] 18  (10/24 0608) BP: (110-144)/(74-95) 131/90 mmHg (10/24 0608) SpO2:  [99 %-100 %] 99 % (10/23 1322)  General: alert, cooperative and fatigued, BP have been stable.  Lochia: appropriate Uterine Fundus: firm Incision: none DVT Evaluation: No evidence of DVT seen on physical exam. Negative Homan's sign. No cords or calf tenderness.  Discharge Diagnoses: Term Pregnancy-delivered  Discharge Information: Date: 12/09/2011 Activity: pelvic rest Diet: routine, breastfeeding mother Contraception: Depo provera today after discharge Medications: Ibuprofen and Percocet Condition: stable Instructions:AVS Discharge to: Home Follow-up Information    Schedule an appointment as soon as possible for a visit in 6 weeks to follow up. (Please make appt when you get home for 6 weeks with your OB)          Newborn Data: Live born female  Birth Weight: 6 lb 13.5 oz (3104 g) Infant rooming in with mother, in stable condition  Home with mother.  Brittany [name] 12/09/2011, 7:36 AM  I have seen this patient and agree with the above resident's note.  LEFTWICH-KIRBY, Jermar Colter Certified Nurse-Midwife

## 2011-12-13 ENCOUNTER — Encounter (HOSPITAL_COMMUNITY): Payer: Self-pay | Admitting: *Deleted

## 2011-12-13 NOTE — MAU Provider Note (Signed)
I saw and examined patient.  Brittany Form, MD

## 2012-01-05 ENCOUNTER — Ambulatory Visit: Payer: Self-pay | Admitting: Advanced Practice Midwife

## 2012-01-11 ENCOUNTER — Emergency Department (HOSPITAL_BASED_OUTPATIENT_CLINIC_OR_DEPARTMENT_OTHER): Payer: Medicaid Other

## 2012-01-11 ENCOUNTER — Encounter (HOSPITAL_BASED_OUTPATIENT_CLINIC_OR_DEPARTMENT_OTHER): Payer: Self-pay | Admitting: Emergency Medicine

## 2012-01-11 ENCOUNTER — Emergency Department (HOSPITAL_BASED_OUTPATIENT_CLINIC_OR_DEPARTMENT_OTHER)
Admission: EM | Admit: 2012-01-11 | Discharge: 2012-01-11 | Disposition: A | Discharge status: Home / Self Care | Payer: Medicaid Other | Attending: Emergency Medicine | Admitting: Emergency Medicine

## 2012-01-11 DIAGNOSIS — Z8619 Personal history of other infectious and parasitic diseases: Secondary | ICD-10-CM | POA: Insufficient documentation

## 2012-01-11 DIAGNOSIS — Z79899 Other long term (current) drug therapy: Secondary | ICD-10-CM | POA: Insufficient documentation

## 2012-01-11 DIAGNOSIS — Z8614 Personal history of Methicillin resistant Staphylococcus aureus infection: Secondary | ICD-10-CM | POA: Insufficient documentation

## 2012-01-11 DIAGNOSIS — J45901 Unspecified asthma with (acute) exacerbation: Secondary | ICD-10-CM

## 2012-01-11 DIAGNOSIS — J45909 Unspecified asthma, uncomplicated: Secondary | ICD-10-CM | POA: Insufficient documentation

## 2012-01-11 DIAGNOSIS — A5400 Gonococcal infection of lower genitourinary tract, unspecified: Secondary | ICD-10-CM | POA: Insufficient documentation

## 2012-01-11 IMAGING — CR DG CHEST 2V
2 series · 2 of 2 positions shown · non-contrast
Comparison: No priors.

CLINICAL DATA: Respiratory distress.

CHEST - 2 VIEW

[w chest pa]
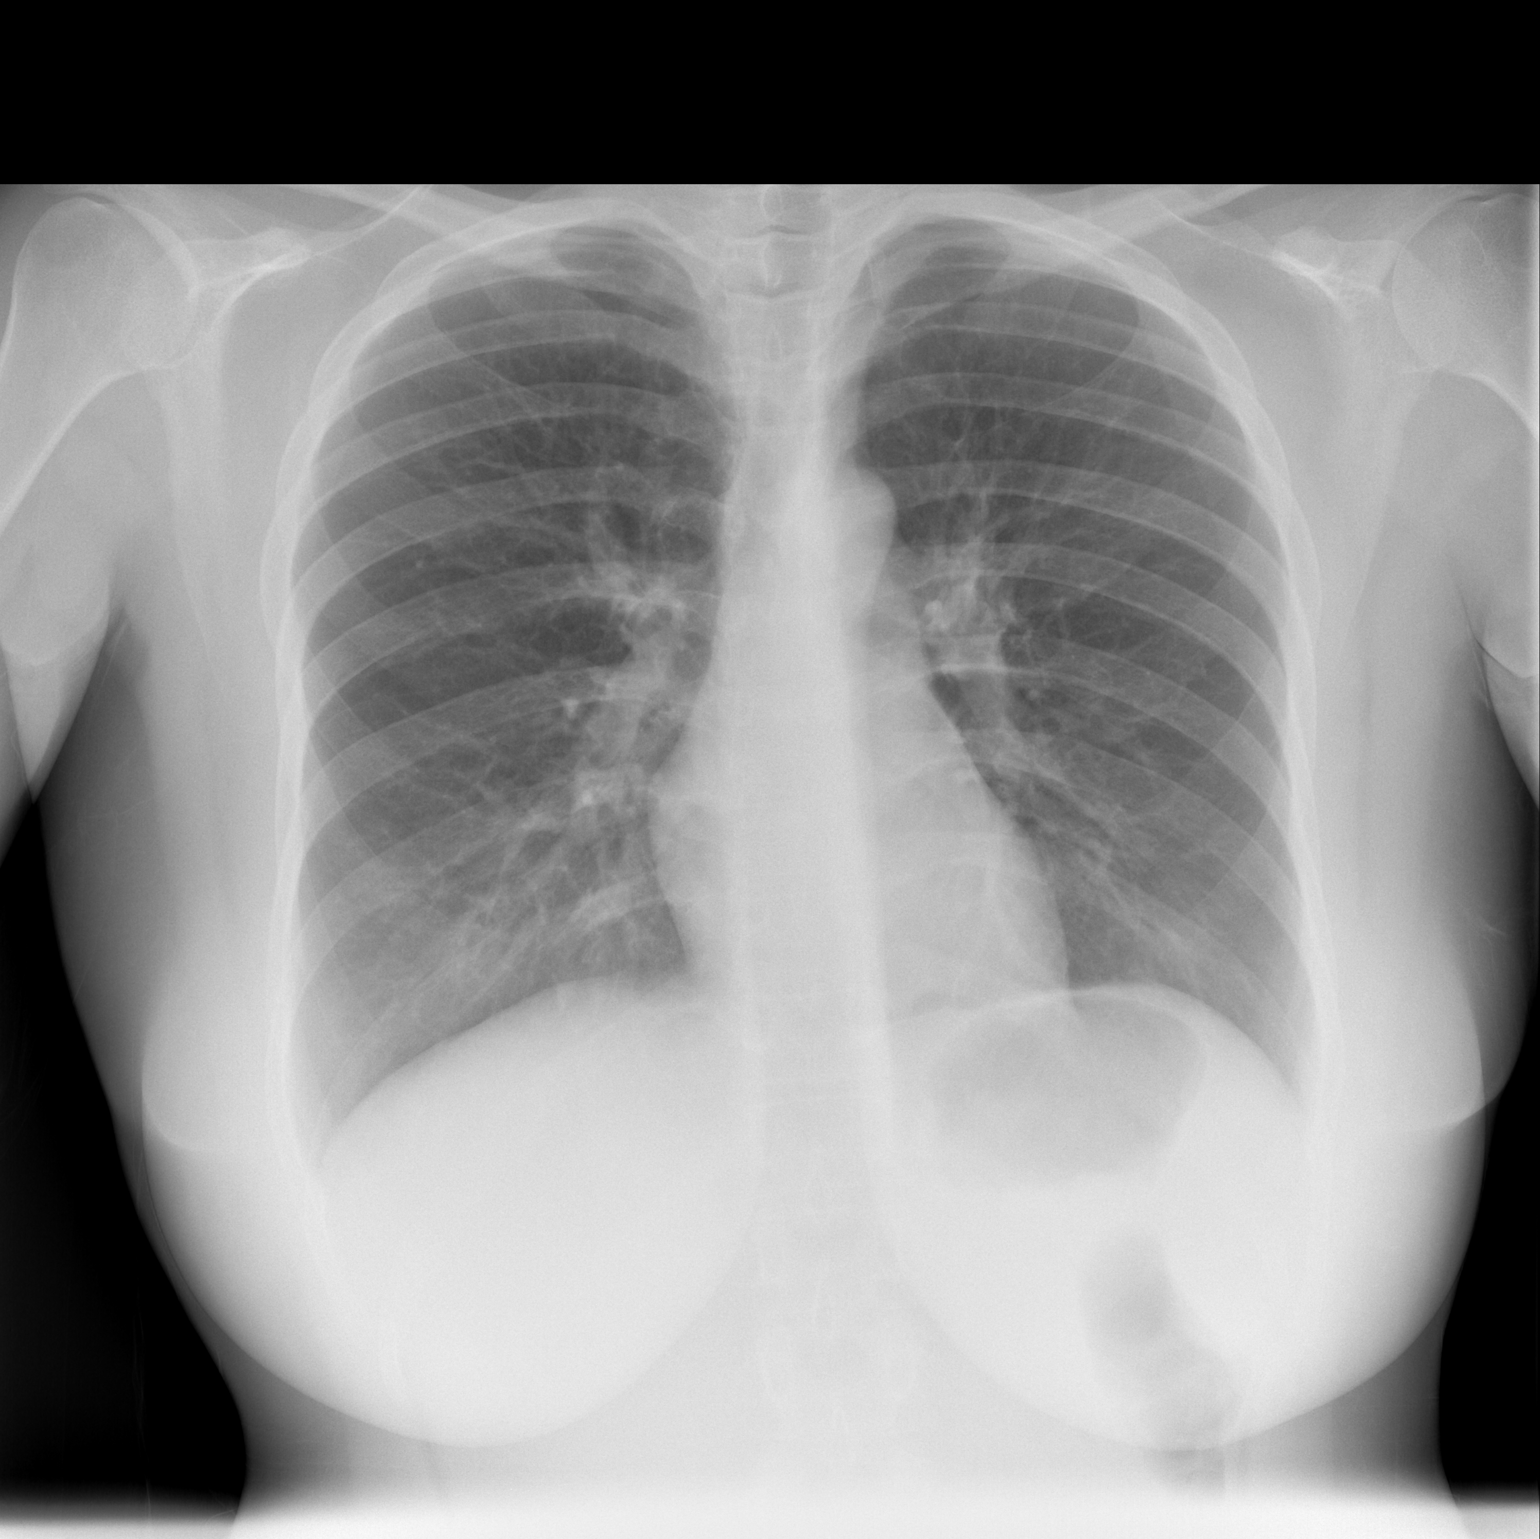

[w chest lat]
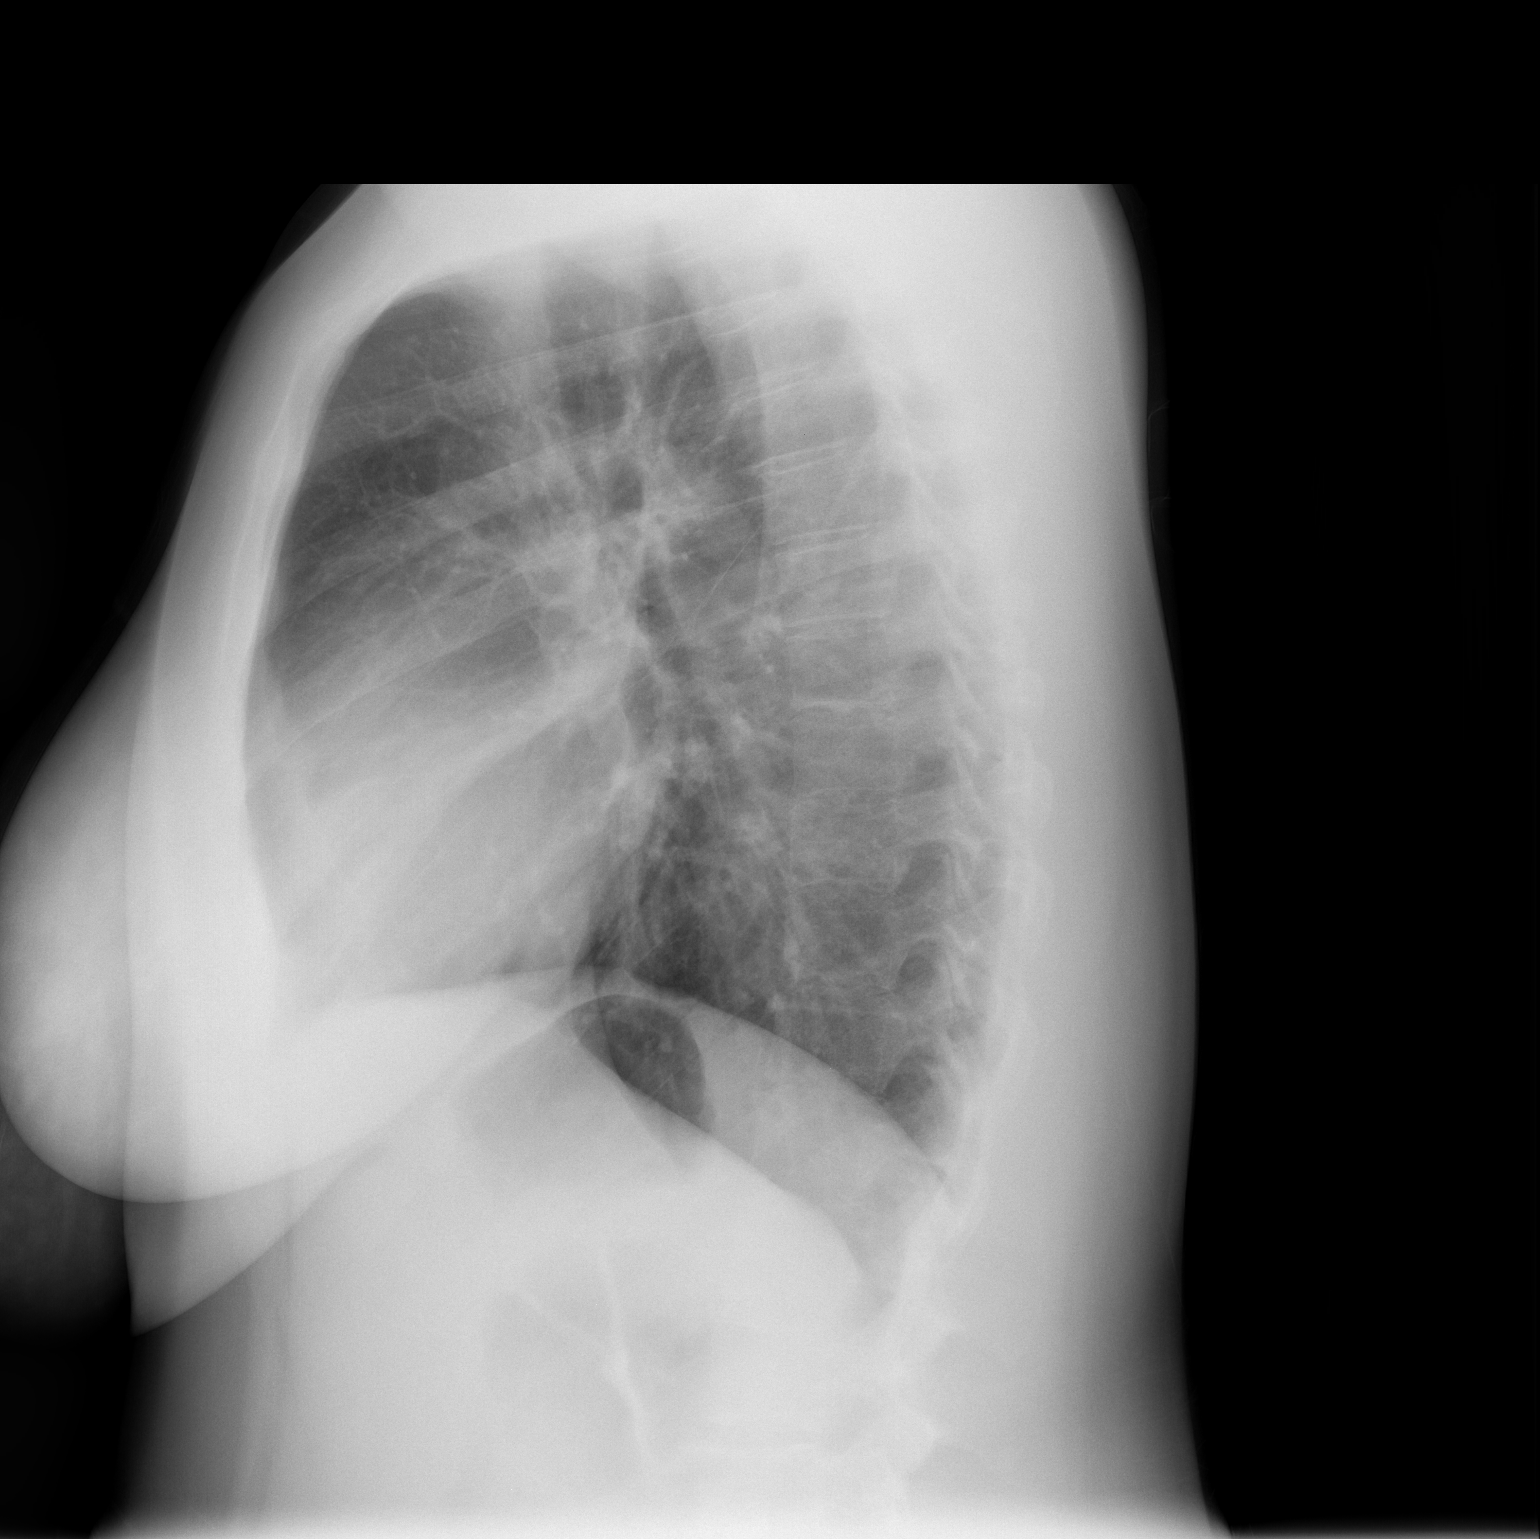

[2 of 2 positions shown; findings below may reference images not displayed]

FINDINGS: Lung volumes are normal.  No consolidative airspace
disease.  No pleural effusions.  No pneumothorax.  No pulmonary
nodule or mass noted.  Pulmonary vasculature and the
cardiomediastinal silhouette are within normal limits.
IMPRESSION: 1. No radiographic evidence of acute cardiopulmonary disease.

## 2012-01-11 MED ORDER — ALBUTEROL SULFATE (5 MG/ML) 0.5% IN NEBU: 15.0000 mg | INHALATION_SOLUTION | Freq: Once | RESPIRATORY_TRACT | Status: DC

## 2012-01-11 MED ORDER — ALBUTEROL SULFATE HFA 108 (90 BASE) MCG/ACT IN AERS
INHALATION_SPRAY | RESPIRATORY_TRACT | Status: AC
  Filled 2012-01-11: qty 6.7

## 2012-01-11 MED ORDER — DEXAMETHASONE SODIUM PHOSPHATE 10 MG/ML IJ SOLN
10.0000 mg | Freq: Once | INTRAMUSCULAR | Status: AC
  Administered 2012-01-11: 10 mg via INTRAMUSCULAR
  Filled 2012-01-11: qty 1

## 2012-01-11 MED ORDER — ALBUTEROL SULFATE HFA 108 (90 BASE) MCG/ACT IN AERS: 1.0000 | INHALATION_SPRAY | Freq: Four times a day (QID) | RESPIRATORY_TRACT | Status: DC | PRN

## 2012-01-11 MED ORDER — FLUTICASONE PROPIONATE HFA 110 MCG/ACT IN AERO: 2.0000 | INHALATION_SPRAY | Freq: Two times a day (BID) | RESPIRATORY_TRACT | Status: DC

## 2012-01-11 MED ORDER — ALBUTEROL SULFATE (5 MG/ML) 0.5% IN NEBU
INHALATION_SOLUTION | RESPIRATORY_TRACT | Status: AC
  Administered 2012-01-11: 15 mg via RESPIRATORY_TRACT
  Filled 2012-01-11: qty 0.5

## 2012-01-11 MED ORDER — ALBUTEROL SULFATE HFA 108 (90 BASE) MCG/ACT IN AERS
2.0000 | INHALATION_SPRAY | Freq: Once | RESPIRATORY_TRACT | Status: AC
  Administered 2012-01-11: 2 via RESPIRATORY_TRACT

## 2012-01-11 MED ORDER — PREDNISONE 50 MG PO TABS: 50.0000 mg | ORAL_TABLET | Freq: Every day | ORAL | Status: DC

## 2012-01-11 NOTE — ED Provider Notes (Signed)
History  This chart was scribed for Brittany [name] Smitty Cords, MD by Ardeen Jourdain, ED Scribe. This patient was seen in room MH09/MH09 and the patient's care was started at 0024.  CSN: [id number]  Arrival date & time 01/11/12  0006   None     Chief Complaint  Patient presents with  . Respiratory Distress     Patient is a 21 y.o. female presenting with shortness of breath and wheezing. The history is provided by the patient. No language interpreter was used.  Shortness of Breath  The current episode started yesterday. The onset was sudden. The problem occurs continuously. The problem has been unchanged. The problem is moderate. Nothing relieves the symptoms. Nothing aggravates the symptoms. Associated symptoms include cough, shortness of breath and wheezing. Pertinent negatives include no chest pain, no chest pressure, no orthopnea, no fever and no stridor. There was no intake of a foreign body. She has not inhaled smoke recently. She has had no prior hospitalizations. She has had no prior ICU admissions. She has had no prior intubations. Her past medical history is significant for asthma and past wheezing. She has been behaving normally. Urine output has been normal.  Wheezing  The current episode started yesterday. The onset was sudden. The problem occurs continuously. The problem has been gradually worsening. The problem is moderate. Nothing relieves the symptoms. Nothing aggravates the symptoms. Associated symptoms include cough, shortness of breath and wheezing. Pertinent negatives include no chest pain, no chest pressure, no orthopnea, no fever and no stridor. She has had no prior hospitalizations. She has had no prior ICU admissions. She has had no prior intubations. Her past medical history is significant for asthma and past wheezing. She has been behaving normally.    Brittany [name] is a 20 y.o. female who presents to the Emergency Department complaining of SOB that started last  night and cough. She was given albuteral enroute. She states she is out of inhalers and flovent at home. She denies any environmental changes that could have triggered the attack. She denies being in contact with any tobacco smoke recently. She has a h/o asthma, gestational diabetes, and MRSA. She is a former smoker but denies alcohol use.   Past Medical History  Diagnosis Date  . Asthma   . NVD (normal vaginal delivery) 08/30/2010  . Gestational diabetes   . Hx MRSA infection 2005  . Trichimoniasis   . Chlamydia   . Gonorrhea     Past Surgical History  Procedure Date  . No past surgeries     Family History  Problem Relation Age of Onset  . Anesthesia problems Neg Hx   . Other Neg Hx   . Diabetes Maternal Grandmother     History  Substance Use Topics  . Smoking status: Former Smoker    Types: Cigarettes  . Smokeless tobacco: Never Used  . Alcohol Use: No    OB History    Grav Para Term Preterm Abortions TAB SAB Ect Mult Living   3 3 3       3       Review of Systems  Constitutional: Negative for fever.  HENT: Negative for neck pain.   Respiratory: Positive for cough, shortness of breath and wheezing. Negative for chest tightness and stridor.   Cardiovascular: Negative for chest pain, palpitations, orthopnea and leg swelling.  All other systems reviewed and are negative.    Allergies  Iodine and Sulfonamide derivatives  Home Medications   Current Outpatient Rx  Name  Route  Sig  Dispense  Refill  . ALBUTEROL SULFATE HFA 108 (90 BASE) MCG/ACT IN AERS   Inhalation   Inhale 2 puffs into the lungs every 6 (six) hours as needed. For shortness of breath         . BENZOCAINE-MENTHOL 20-0.5 % EX AERO   Topical   Apply 1 application topically as needed (perineal discomfort).         . DIBUCAINE 1 % RE OINT   Rectal   Place 1 application rectally as needed.         Marland Kitchen FERROUS SULFATE 325 (65 FE) MG PO TABS   Oral   Take 1 tablet (325 mg total) by mouth 2  (two) times daily with a meal.   80 tablet   0   . IBUPROFEN 600 MG PO TABS   Oral   Take 1 tablet (600 mg total) by mouth every 6 (six) hours.   30 tablet   0   . LANOLIN HYDROUS EX OINT   Topical   Apply 1 application topically as needed (for breast care).         . NAPHAZOLINE-PHENIRAMINE 0.025-0.3 % OP SOLN   Both Eyes   Place 1 drop into both eyes daily as needed. Dryness/itching         . OXYCODONE-ACETAMINOPHEN 5-325 MG PO TABS   Oral   Take 1-2 tablets by mouth every 3 (three) hours as needed (moderate - severe pain).   20 tablet   0   . WITCH HAZEL-GLYCERIN EX PADS   Topical   Apply 1 application topically as needed.   40 each        Triage Vitals: BP 140/110  Pulse 103  Resp 20  SpO2 100%  Breastfeeding? Unknown  Physical Exam  Nursing note and vitals reviewed. Constitutional: She is oriented to person, place, and time. She appears well-developed and well-nourished. No distress.  HENT:  Head: Normocephalic and atraumatic.  Mouth/Throat: Oropharynx is clear and moist. No oropharyngeal exudate.  Eyes: EOM are normal. Pupils are equal, round, and reactive to light.  Neck: Normal range of motion. Neck supple. No tracheal deviation present.  Cardiovascular: Normal rate, regular rhythm and normal heart sounds.   Pulmonary/Chest: Effort normal. No stridor. No respiratory distress. She has wheezes. She has no rales. She exhibits no tenderness.       able to speak in complete sentences, airway patent no swelling of the lips tongue or uvula  Abdominal: Soft. Bowel sounds are normal. She exhibits no distension. There is no tenderness.  Musculoskeletal: Normal range of motion. She exhibits no edema.  Lymphadenopathy:    She has no cervical adenopathy.  Neurological: She is alert and oriented to person, place, and time. No cranial nerve deficit.  Skin: Skin is warm and dry.  Psychiatric: She has a normal mood and affect. Her behavior is normal.    ED Course    Procedures (including critical care time)  DIAGNOSTIC STUDIES: Oxygen Saturation is 100% on room air, normal by my interpretation.    COORDINATION OF CARE:  12:27 AM: Discussed treatment plan which includes a breathing treatment with pt at bedside and pt agreed to plan.    Labs Reviewed - No data to display No results found.   No diagnosis found.    MDM  Acute asthma exacerbation following being out of medications.  Better following 1 hour neb treatment will prescribe new MDI and flovent and steroids.  Follow up with your  OB and tell your pediatrician your are taking these medications as you are breat feeding.  Patient verbalizes understanding and agrees to follow up     I personally performed the services described in this documentation, which was scribed in my presence. The recorded information has been reviewed and is accurate.     Jasmine Awe, MD 01/11/12 [phone]

## 2012-01-11 NOTE — ED Notes (Addendum)
SOB started at noon.  Getting worse all day.  Given Albuteral 5mg /Atrovent 0.5 enroute. Pt out of inhalers and flovent at home.  No ins.

## 2012-01-11 NOTE — ED Notes (Signed)
Pt given 15mg  albuterol over one hour.

## 2012-11-09 ENCOUNTER — Inpatient Hospital Stay (HOSPITAL_COMMUNITY)
Admission: AD | Admit: 2012-11-09 | Discharge: 2012-11-09 | Disposition: A | Discharge status: Home / Self Care | Payer: Medicaid Other | Source: Ambulatory Visit | Attending: Obstetrics & Gynecology | Admitting: Obstetrics & Gynecology

## 2012-11-09 ENCOUNTER — Encounter (HOSPITAL_COMMUNITY): Payer: Self-pay | Admitting: *Deleted

## 2012-11-09 ENCOUNTER — Inpatient Hospital Stay (HOSPITAL_COMMUNITY): Payer: Medicaid Other

## 2012-11-09 DIAGNOSIS — N949 Unspecified condition associated with female genital organs and menstrual cycle: Secondary | ICD-10-CM | POA: Insufficient documentation

## 2012-11-09 DIAGNOSIS — N83209 Unspecified ovarian cyst, unspecified side: Secondary | ICD-10-CM

## 2012-11-09 DIAGNOSIS — N926 Irregular menstruation, unspecified: Secondary | ICD-10-CM

## 2012-11-09 LAB — URINALYSIS, ROUTINE W REFLEX MICROSCOPIC
Bilirubin Urine: NEGATIVE
Ketones, ur: NEGATIVE mg/dL
Nitrite: NEGATIVE
Specific Gravity, Urine: 1.025 (ref 1.005–1.030)
Urobilinogen, UA: 0.2 mg/dL (ref 0.0–1.0)

## 2012-11-09 LAB — CBC WITH DIFFERENTIAL/PLATELET
Hemoglobin: 11.7 g/dL — ABNORMAL LOW (ref 12.0–15.0)
Lymphocytes Relative: 39 % (ref 12–46)
Lymphs Abs: 3.9 10*3/uL (ref 0.7–4.0)
Monocytes Relative: 8 % (ref 3–12)
Neutro Abs: 4.5 10*3/uL (ref 1.7–7.7)
Neutrophils Relative %: 45 % (ref 43–77)
RBC: 4.25 MIL/uL (ref 3.87–5.11)
WBC: 10 10*3/uL (ref 4.0–10.5)

## 2012-11-09 LAB — WET PREP, GENITAL

## 2012-11-09 IMAGING — US US PELVIS COMPLETE
1 series · 13 of 25 positions shown · non-contrast
Comparison: No recent similar comparison exam.

CLINICAL DATA: Pelvic pain

EXAM:
TRANSABDOMINAL AND TRANSVAGINAL ULTRASOUND OF PELVIS
TECHNIQUE: Both transabdominal and transvaginal ultrasound examinations of the
pelvis were performed. Transabdominal technique was performed for
global imaging of the pelvis including uterus, ovaries, adnexal
regions, and pelvic cul-de-sac. It was necessary to proceed with
endovaginal exam following the transabdominal exam to visualize the
endometrium and ovaries.

[Series 1: us pelvis complete · 13 of 52 slices shown]
[im 1/52]
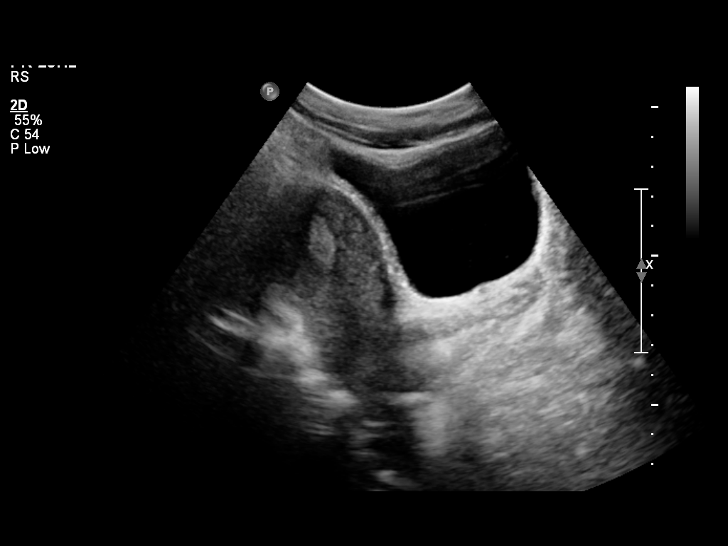
[im 5/52]
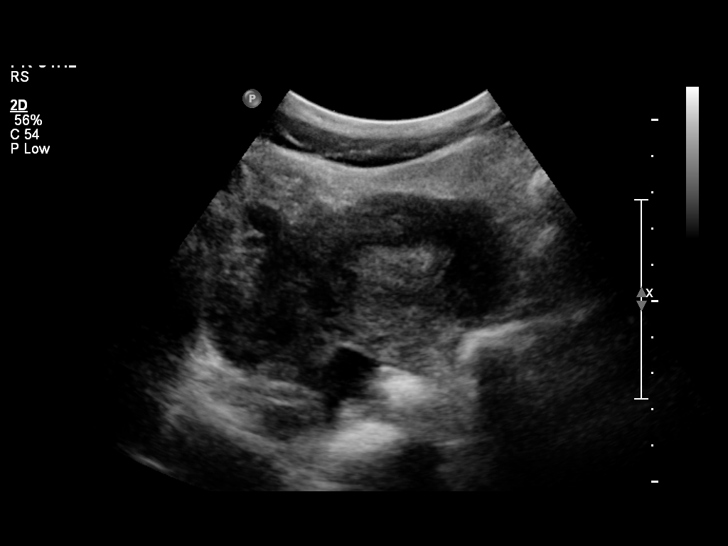
[im 9/52]
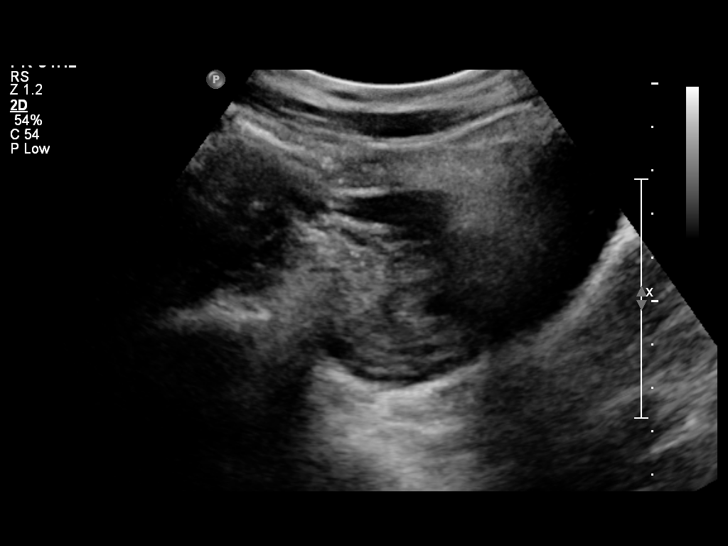
[im 13/52]
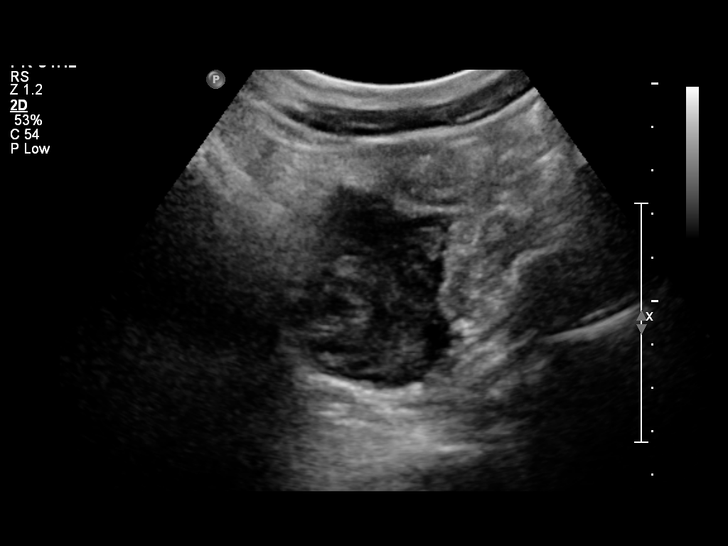
[im 18/52]
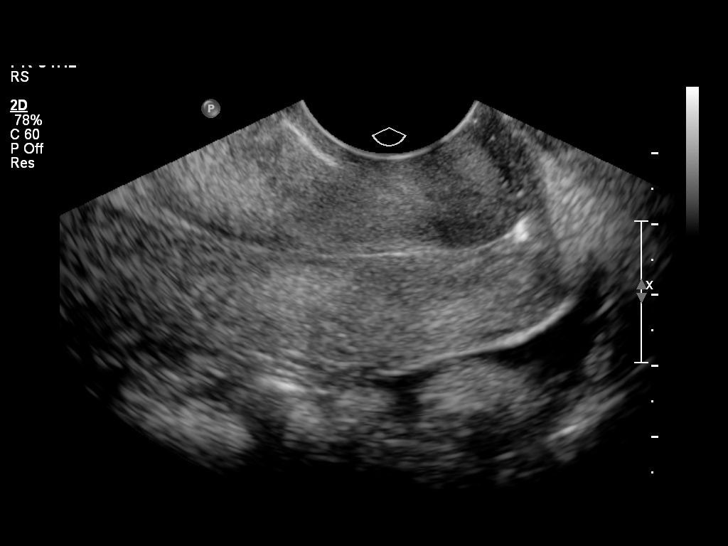
[im 22/52]
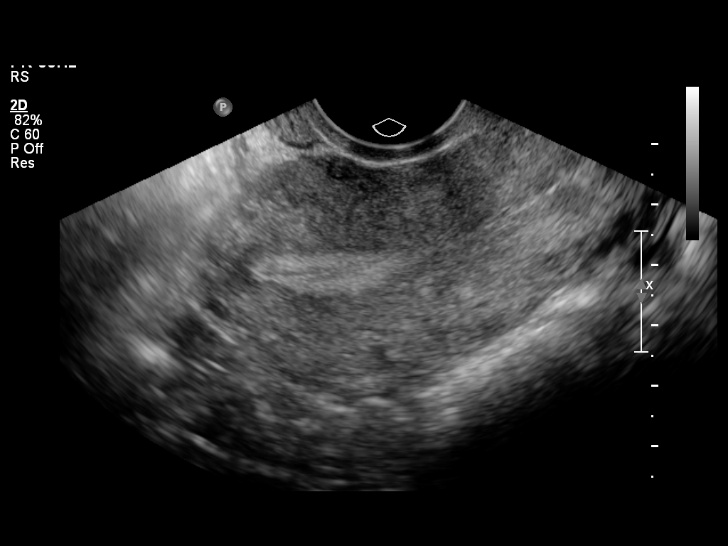
[im 26/52]
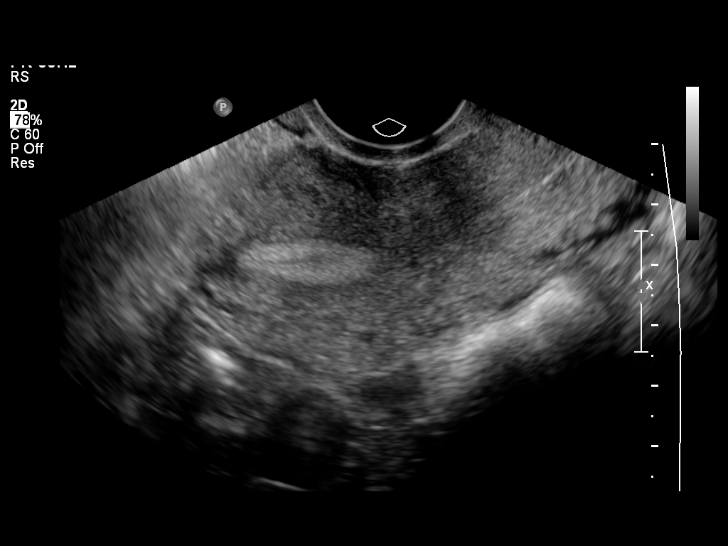
[im 30/52]
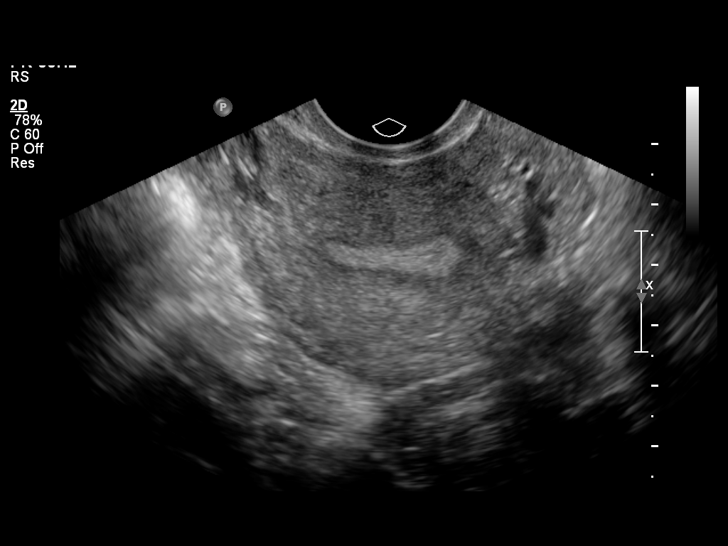
[im 35/52]
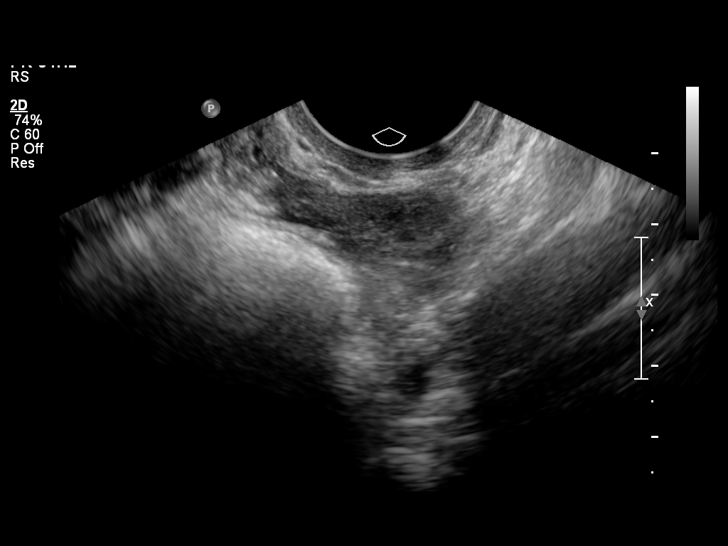
[im 39/52]
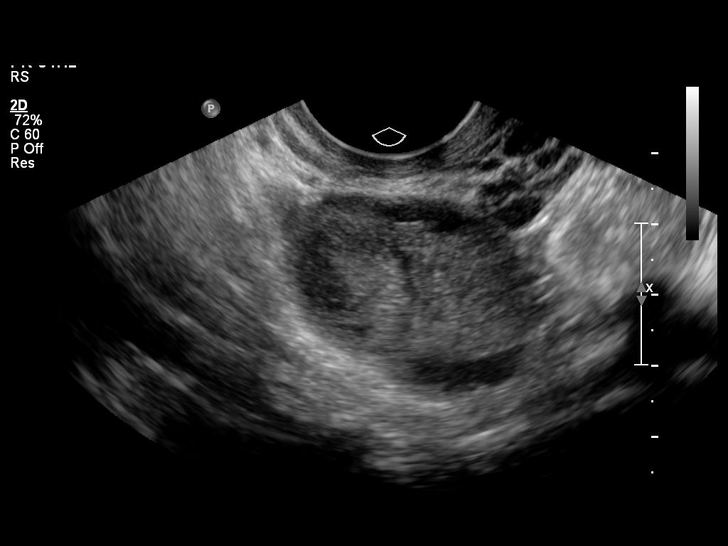
[im 43/52]
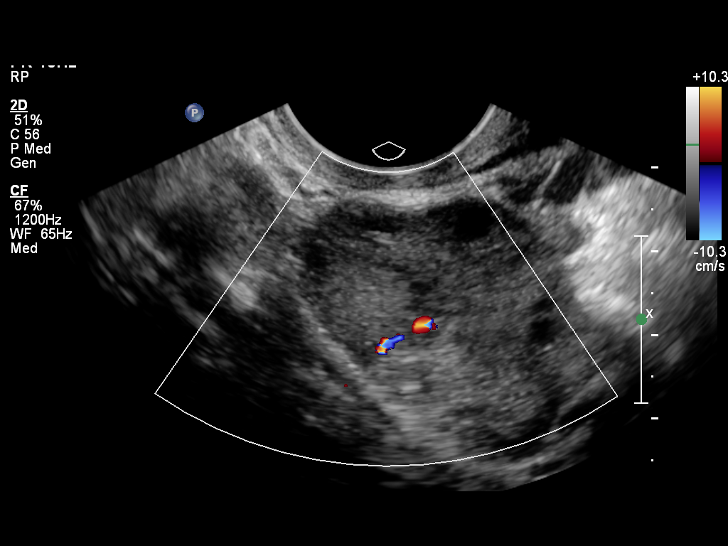
[im 47/52]
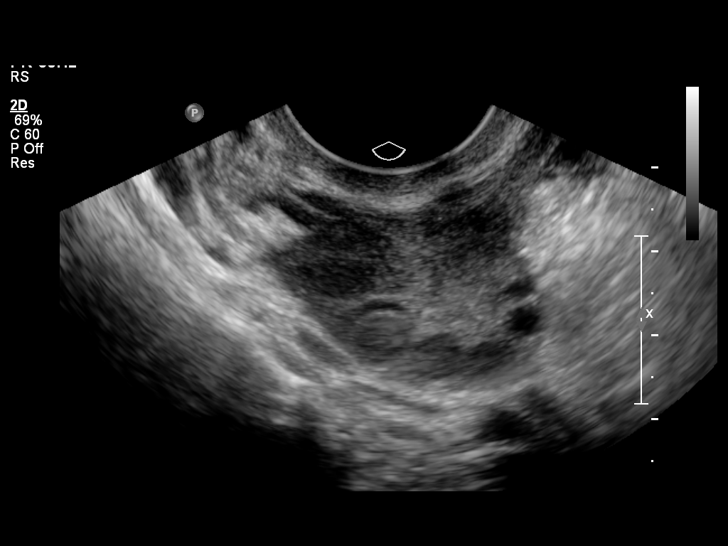
[im 52/52]
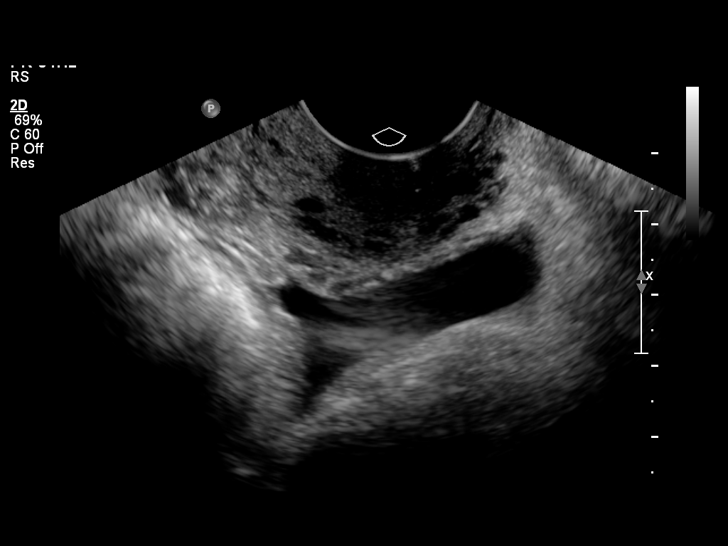

[13 of 25 positions shown; findings below may reference images not displayed]

FINDINGS: Uterus

Measurements: 8.8 x 5.3 x 4.4 cm. Anteverted, anteflexed. No
fibroids or other mass visualized.

Endometrium

Thickness: 7 mm. Borderline trilaminar in appearance without focal
abnormality. No focal abnormality visualized.

Right ovary

Measurements: 4.0 x 3.5 x 3.1 cm. 2.0 x 1.9 x 1.5 cm probable
collapsing physiologic cyst.

Left ovary

Measurements: 3.1 x 2.1 x 1.9 cm. Normal appearance/no adnexal mass.

Other findings

Small free fluid noted.
IMPRESSION: Probable collapsing physiologic right ovarian cyst. If the patient
is having right lower quadrant pain, consider followup pelvic
ultrasound in 4-6 weeks during the week following the patient's
menses to ensure resolution. These results were called by telephone
at the time of interpretation on [DATE] at [DATE] to Dr. JF
JF , who verbally acknowledged these results.

## 2012-11-09 MED ORDER — TRAMADOL HCL 50 MG PO TABS: 50.0000 mg | ORAL_TABLET | Freq: Four times a day (QID) | ORAL | Status: DC | PRN

## 2012-11-09 MED ORDER — KETOROLAC TROMETHAMINE 60 MG/2ML IM SOLN
60.0000 mg | Freq: Once | INTRAMUSCULAR | Status: DC
  Filled 2012-11-09: qty 2

## 2012-11-09 NOTE — MAU Provider Note (Signed)
History     CSN: [id number]  Arrival date and time: 11/09/12 1559   First Provider Initiated Contact with Patient 11/09/12 1701      Chief Complaint  Patient presents with  . Abdominal Pain  . Vaginal Discharge   HPI This is a 22 y.o. female who presents with c/o RLQ pain for 3 days. ALso has some vaginal discharge. No menstrual abnormalities.  RN Note: Pt states she thinks she got the discharge from wearing lace panties 2-3 wks , the pain pt indicates she is on the right side and pan has started getting real bad for the last 3 days . 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       OB History   Grav Para Term Preterm Abortions TAB SAB Ect Mult Living   3 3 3       3       Past Medical History  Diagnosis Date  . Asthma   . NVD (normal vaginal delivery) 08/30/2010  . Gestational diabetes   . Hx MRSA infection 2005  . Trichimoniasis   . Chlamydia   . Gonorrhea     Past Surgical History  Procedure Laterality Date  . No past surgeries      Family History  Problem Relation Age of Onset  . Anesthesia problems Neg Hx   . Other Neg Hx   . Diabetes Maternal Grandmother   . Hypertension Father   . Heart disease Father   . Lung disease Father     History  Substance Use Topics  . Smoking status: Former Smoker    Types: Cigarettes  . Smokeless tobacco: Never Used  . Alcohol Use: No    Allergies:  Allergies  Allergen Reactions  . Iodine Other (See Comments)    blistering  . Sulfonamide Derivatives Other (See Comments)    blistering    Prescriptions prior to admission  Medication Sig Dispense Refill  . albuterol (PROVENTIL HFA;VENTOLIN HFA) 108 (90 BASE) MCG/ACT inhaler Inhale 1-2 puffs into the lungs every 6 (six) hours as needed for wheezing.  1 Inhaler  0    Review of Systems  Constitutional: Negative for fever and chills.  Gastrointestinal: Positive for abdominal pain (mild RLQ). Negative for nausea, vomiting, diarrhea and constipation.  Genitourinary:  Negative for dysuria.  Neurological: Negative for dizziness.   Physical Exam   Last menstrual period 10/23/2012, not currently breastfeeding.  Physical Exam  Constitutional: She is oriented to person, place, and time. She appears well-developed and well-nourished. No distress.  Cardiovascular: Normal rate.   Respiratory: Effort normal.  GI: Soft. She exhibits no distension and no mass. There is tenderness. There is no rebound and no guarding.  Genitourinary: Vagina normal and uterus normal. No vaginal discharge found.  Musculoskeletal: Normal range of motion.  Neurological: She is alert and oriented to person, place, and time.  Skin: Skin is warm and dry.  Psychiatric: She has a normal mood and affect.    MAU Course  Procedures  MDM US Pelvis Complete  11/09/2012   CLINICAL DATA:  Pelvic pain  EXAM: TRANSABDOMINAL AND TRANSVAGINAL ULTRASOUND OF PELVIS  TECHNIQUE: Both transabdominal and transvaginal ultrasound examinations of the pelvis were performed. Transabdominal technique was performed for global imaging of the pelvis including uterus, ovaries, adnexal regions, and pelvic cul-de-sac. It was necessary to proceed with endovaginal exam following the transabdominal exam to visualize the endometrium and ovaries.  COMPARISON:  No recent similar comparison exam.  FINDINGS: Uterus  Measurements: 8.8  x 5.3 x 4.4 cm. Anteverted, anteflexed. No fibroids or other mass visualized.  Endometrium  Thickness: 7 mm. Borderline trilaminar in appearance without focal abnormality. No focal abnormality visualized.  Right ovary  Measurements: 4.0 x 3.5 x 3.1 cm. 2.0 x 1.9 x 1.5 cm probable collapsing physiologic cyst.  Left ovary  Measurements: 3.1 x 2.1 x 1.9 cm. Normal appearance/no adnexal mass.  Other findings  Small free fluid noted.  IMPRESSION: Probable collapsing physiologic right ovarian cyst. If the patient is having right lower quadrant pain, consider followup pelvic ultrasound in 4-6 weeks during  the week following the patient's menses to ensure resolution. These results were called by telephone at the time of interpretation on 11/09/2012 at 7:30 PM to Dr. Wynelle Bourgeois , who verbally acknowledged these results.   Electronically Signed   By: Christiana Pellant M.D.   On: 11/09/2012 19:31   Results for orders placed during the hospital encounter of 11/09/12 (from the past 72 hour(s))  URINALYSIS, ROUTINE W REFLEX MICROSCOPIC     Status: None   Collection Time    11/09/12  4:06 PM      Result Value Range   Color, Urine YELLOW  YELLOW   APPearance CLEAR  CLEAR   Specific Gravity, Urine 1.025  1.005 - 1.030   pH 7.5  5.0 - 8.0   Glucose, UA NEGATIVE  NEGATIVE mg/dL   Hgb urine dipstick NEGATIVE  NEGATIVE   Bilirubin Urine NEGATIVE  NEGATIVE   Ketones, ur NEGATIVE  NEGATIVE mg/dL   Protein, ur NEGATIVE  NEGATIVE mg/dL   Urobilinogen, UA 0.2  0.0 - 1.0 mg/dL   Nitrite NEGATIVE  NEGATIVE   Leukocytes, UA NEGATIVE  NEGATIVE   Comment: MICROSCOPIC NOT DONE ON URINES WITH NEGATIVE PROTEIN, BLOOD, LEUKOCYTES, NITRITE, OR GLUCOSE <1000 mg/dL.  POCT PREGNANCY, URINE     Status: None   Collection Time    11/09/12  4:22 PM      Result Value Range   Preg Test, Ur NEGATIVE  NEGATIVE   Comment:            THE SENSITIVITY OF THIS     METHODOLOGY IS >24 mIU/mL  GC/CHLAMYDIA PROBE AMP     Status: None   Collection Time    11/09/12  5:10 PM      Result Value Range   CT Probe RNA NEGATIVE  NEGATIVE   GC Probe RNA NEGATIVE  NEGATIVE   Comment: (NOTE)                                                                                              Normal Reference Range: Negative          Assay performed using the Gen-Probe APTIMA COMBO2 (R) Assay.     Acceptable specimen types for this assay include APTIMA Swabs (Unisex,     endocervical, urethral, or vaginal), first void urine, and ThinPrep     liquid based cytology samples.     Performed at [facility]  WET PREP, GENITAL     Status:  Abnormal   Collection Time    11/09/12  5:10 PM      Result Value Range   Yeast Wet Prep HPF POC NONE SEEN  NONE SEEN   Comment: DILUTE PATIENT SAMPLE TOO MUCH SALINE ADDED   Trich, Wet Prep NONE SEEN  NONE SEEN   Clue Cells Wet Prep HPF POC RARE (*) NONE SEEN   WBC, Wet Prep HPF POC FEW (*) NONE SEEN   Comment: FEW BACTERIA SEEN  CBC WITH DIFFERENTIAL     Status: Abnormal   Collection Time    11/09/12  5:19 PM      Result Value Range   WBC 10.0  4.0 - 10.5 K/uL   RBC 4.25  3.87 - 5.11 MIL/uL   Hemoglobin 11.7 (*) 12.0 - 15.0 g/dL   HCT 16.1 (*) 09.6 - 04.5 %   MCV 79.1  78.0 - 100.0 fL   MCH 27.5  26.0 - 34.0 pg   MCHC 34.8  30.0 - 36.0 g/dL   RDW 40.9  81.1 - 91.4 %   Platelets 222  150 - 400 K/uL   Neutrophils Relative % 45  43 - 77 %   Neutro Abs 4.5  1.7 - 7.7 K/uL   Lymphocytes Relative 39  12 - 46 %   Lymphs Abs 3.9  0.7 - 4.0 K/uL   Monocytes Relative 8  3 - 12 %   Monocytes Absolute 0.8  0.1 - 1.0 K/uL   Eosinophils Relative 8 (*) 0 - 5 %   Eosinophils Absolute 0.8 (*) 0.0 - 0.7 K/uL   Basophils Relative 0  0 - 1 %   Basophils Absolute 0.0  0.0 - 0.1 K/uL     Assessment and Plan  A:  RLQ pain, probably due to collapsing R ovarian cyst  P:  Discharge home      Reassured this will most likely resolve on its own.       Ibuprofen for pain     Followup if not better  [facility] 11/09/2012, 5:01 PM

## 2012-11-09 NOTE — MAU Note (Signed)
Pt states she thinks she got the discharge from wearing lace panties 2-3 wks , the pain pt indicates she is on the right side and pan has started getting real bad for the last 3 days . 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

## 2012-11-09 NOTE — Progress Notes (Signed)
Pt refused toradol injection for pain

## 2012-11-10 LAB — GC/CHLAMYDIA PROBE AMP: CT Probe RNA: NEGATIVE

## 2012-11-13 NOTE — MAU Provider Note (Signed)
Attestation of Attending Supervision of Advanced Practitioner (PA/CNM/NP): Evaluation and management procedures were performed by the Advanced Practitioner under my supervision and collaboration.  I have reviewed the Advanced Practitioner's note and chart, and I agree with the management and plan.  Cana Mignano, MD, FACOG Attending Obstetrician & Gynecologist Faculty Practice, [facility]  

## 2012-12-11 ENCOUNTER — Emergency Department (HOSPITAL_COMMUNITY)
Admission: EM | Admit: 2012-12-11 | Discharge: 2012-12-11 | Disposition: A | Discharge status: Home / Self Care | Payer: Medicaid Other | Attending: Emergency Medicine | Admitting: Emergency Medicine

## 2012-12-11 ENCOUNTER — Encounter (HOSPITAL_COMMUNITY): Payer: Self-pay | Admitting: Emergency Medicine

## 2012-12-11 DIAGNOSIS — J45909 Unspecified asthma, uncomplicated: Secondary | ICD-10-CM | POA: Insufficient documentation

## 2012-12-11 DIAGNOSIS — R509 Fever, unspecified: Secondary | ICD-10-CM | POA: Insufficient documentation

## 2012-12-11 DIAGNOSIS — J029 Acute pharyngitis, unspecified: Secondary | ICD-10-CM | POA: Insufficient documentation

## 2012-12-11 DIAGNOSIS — Z8619 Personal history of other infectious and parasitic diseases: Secondary | ICD-10-CM | POA: Insufficient documentation

## 2012-12-11 DIAGNOSIS — Z87891 Personal history of nicotine dependence: Secondary | ICD-10-CM | POA: Insufficient documentation

## 2012-12-11 DIAGNOSIS — R599 Enlarged lymph nodes, unspecified: Secondary | ICD-10-CM | POA: Insufficient documentation

## 2012-12-11 DIAGNOSIS — Z8632 Personal history of gestational diabetes: Secondary | ICD-10-CM | POA: Insufficient documentation

## 2012-12-11 DIAGNOSIS — Z8614 Personal history of Methicillin resistant Staphylococcus aureus infection: Secondary | ICD-10-CM | POA: Insufficient documentation

## 2012-12-11 LAB — RAPID STREP SCREEN (MED CTR MEBANE ONLY): Streptococcus, Group A Screen (Direct): NEGATIVE

## 2012-12-11 MED ORDER — DEXTROSE 5 % AND 0.9 % NACL IV BOLUS
1000.0000 mL | Freq: Once | INTRAVENOUS | Status: AC
  Administered 2012-12-11: 1000 mL via INTRAVENOUS

## 2012-12-11 MED ORDER — MORPHINE SULFATE 4 MG/ML IJ SOLN
6.0000 mg | Freq: Once | INTRAMUSCULAR | Status: AC
  Administered 2012-12-11: 6 mg via INTRAVENOUS
  Filled 2012-12-11: qty 2

## 2012-12-11 MED ORDER — ONDANSETRON HCL 4 MG/2ML IJ SOLN
4.0000 mg | Freq: Once | INTRAMUSCULAR | Status: AC
  Administered 2012-12-11: 4 mg via INTRAVENOUS
  Filled 2012-12-11: qty 2

## 2012-12-11 MED ORDER — KETOROLAC TROMETHAMINE 30 MG/ML IJ SOLN
30.0000 mg | Freq: Once | INTRAMUSCULAR | Status: AC
  Administered 2012-12-11: 30 mg via INTRAVENOUS
  Filled 2012-12-11: qty 1

## 2012-12-11 MED ORDER — DEXAMETHASONE SODIUM PHOSPHATE 10 MG/ML IJ SOLN
10.0000 mg | Freq: Once | INTRAMUSCULAR | Status: AC
  Administered 2012-12-11: 10 mg via INTRAVENOUS
  Filled 2012-12-11: qty 1

## 2012-12-11 MED ORDER — PENICILLIN G BENZATHINE 1200000 UNIT/2ML IM SUSP
1.2000 10*6.[IU] | Freq: Once | INTRAMUSCULAR | Status: AC
  Administered 2012-12-11: 1.2 10*6.[IU] via INTRAMUSCULAR
  Filled 2012-12-11: qty 2

## 2012-12-11 MED ORDER — HYDROCODONE-ACETAMINOPHEN 7.5-325 MG/15ML PO SOLN: 10.0000 mL | ORAL | Status: DC | PRN

## 2012-12-11 NOTE — ED Notes (Signed)
Present with "sore throat for couple of days."  Pt reports "kids been sick too with a cold."  Pt deneis nv, chills fever

## 2012-12-11 NOTE — Progress Notes (Signed)
[facility] provided pt with a list of primary care resources.  °

## 2012-12-11 NOTE — ED Notes (Signed)
Pt verbalizes understanding 

## 2012-12-11 NOTE — ED Notes (Signed)
Pt c/o sore throat and fever since yesterday. Pt sts unable to swallow.

## 2012-12-12 LAB — CULTURE, GROUP A STREP

## 2012-12-17 NOTE — ED Provider Notes (Signed)
CSN: [id number]     Arrival date & time 12/11/12  1012 History   First MD Initiated Contact with Patient 12/11/12 1107     No chief complaint on file.  (Consider location/radiation/quality/duration/timing/severity/associated sxs/prior Treatment) HPI  22 year old female with sore throat. Gradual onset about 2 days ago and steadily worsening yesterday. Pain is constant and worse with swallowing. Subjective fever. No cough. No difficulty breathing. Has young children with recent cold-like symptoms. Past history of asthma, otherwise healthy. No interventions prior to arrival.  Past Medical History  Diagnosis Date  . Asthma   . NVD (normal vaginal delivery) 08/30/2010  . Gestational diabetes   . Hx MRSA infection 2005  . Trichimoniasis   . Chlamydia   . Gonorrhea    Past Surgical History  Procedure Laterality Date  . No past surgeries     Family History  Problem Relation Age of Onset  . Anesthesia problems Neg Hx   . Other Neg Hx   . Diabetes Maternal Grandmother   . Hypertension Father   . Heart disease Father   . Lung disease Father    History  Substance Use Topics  . Smoking status: Former Smoker    Types: Cigarettes  . Smokeless tobacco: Never Used  . Alcohol Use: No   OB History   Grav Para Term Preterm Abortions TAB SAB Ect Mult Living   3 3 3       3      Review of Systems  All systems reviewed and negative, other than as noted in HPI.   Allergies  Iodine and Sulfonamide derivatives  Home Medications   Current Outpatient Rx  Name  Route  Sig  Dispense  Refill  . HYDROcodone-acetaminophen (HYCET) 7.5-325 mg/15 ml solution   Oral   Take 10 mLs by mouth every 4 (four) hours as needed for pain.   120 mL   0    BP 109/74  Pulse 69  Temp(Src) 99.6 F (37.6 C) (Oral)  Resp 20  SpO2 99%  LMP 11/11/2012 Physical Exam  Nursing note and vitals reviewed. Constitutional: She appears well-developed and well-nourished. No distress.  HENT:  Head:  Normocephalic and atraumatic.  Symmetric, enlarged inflamed tonsils with exudate. Uvula is midline. Foul-smelling breath. Handling secretions. No tongue elevation. Neck is supple. Tender cervical adenopathy. No stridor. No increased work of breathing.  Eyes: Conjunctivae are normal. Pupils are equal, round, and reactive to light. Right eye exhibits no discharge. Left eye exhibits no discharge.  Neck: Normal range of motion. Neck supple.  Cardiovascular: Normal rate, regular rhythm and normal heart sounds.  Exam reveals no gallop and no friction rub.   No murmur heard. Pulmonary/Chest: Effort normal and breath sounds normal. No stridor. No respiratory distress.  Abdominal: Soft. She exhibits no distension. There is no tenderness.  Musculoskeletal: She exhibits no edema and no tenderness.  Lymphadenopathy:    She has cervical adenopathy.  Neurological: She is alert.  Skin: Skin is warm and dry.  Psychiatric: She has a normal mood and affect. Her behavior is normal. Thought content normal.    ED Course  Procedures (including critical care time) Labs Review Labs Reviewed  RAPID STREP SCREEN  CULTURE, GROUP A STREP   Imaging Review No results found.  EKG Interpretation   None       MDM   1. Pharyngitis, acute    22 year old female with sore throat. A rapid strep test was negative, but clinically strep pharyngitis with 4 out of 4 Centor  criteria. No evidence of acute suppurative complication. Patient was treated with a one-time IM dose of penicillin. Initially pain medicine in dose of steroids for symptomatic treatment. IV fluids for poor recent by mouth intake. Improved symptoms and tolerating liquids prior to discharge. Return precautions were discussed.    Raeford Razor, MD 12/17/12 1438

## 2012-12-19 ENCOUNTER — Encounter (HOSPITAL_COMMUNITY): Payer: Self-pay | Admitting: Emergency Medicine

## 2012-12-19 ENCOUNTER — Emergency Department (HOSPITAL_COMMUNITY)
Admission: EM | Admit: 2012-12-19 | Discharge: 2012-12-19 | Disposition: A | Discharge status: Home / Self Care | Payer: Medicaid Other | Attending: Emergency Medicine | Admitting: Emergency Medicine

## 2012-12-19 DIAGNOSIS — J45901 Unspecified asthma with (acute) exacerbation: Secondary | ICD-10-CM | POA: Insufficient documentation

## 2012-12-19 DIAGNOSIS — Z8632 Personal history of gestational diabetes: Secondary | ICD-10-CM | POA: Insufficient documentation

## 2012-12-19 DIAGNOSIS — Z87891 Personal history of nicotine dependence: Secondary | ICD-10-CM | POA: Insufficient documentation

## 2012-12-19 DIAGNOSIS — Z8614 Personal history of Methicillin resistant Staphylococcus aureus infection: Secondary | ICD-10-CM | POA: Insufficient documentation

## 2012-12-19 DIAGNOSIS — R062 Wheezing: Secondary | ICD-10-CM

## 2012-12-19 MED ORDER — ALBUTEROL SULFATE HFA 108 (90 BASE) MCG/ACT IN AERS
1.0000 | INHALATION_SPRAY | RESPIRATORY_TRACT | Status: DC | PRN
  Administered 2012-12-19: 2 via RESPIRATORY_TRACT
  Filled 2012-12-19: qty 6.7

## 2012-12-19 NOTE — ED Provider Notes (Signed)
CSN: [id number]     Arrival date & time 12/19/12  1537 History   First MD Initiated Contact with Patient 12/19/12 1539     Chief Complaint  Patient presents with  . Asthma   (Consider location/radiation/quality/duration/timing/severity/associated sxs/prior Treatment) The history is provided by the patient and medical records.   This is a 22 year old female it has history significant for asthma, presenting to the ED for chest tightness.  Patient states she was sitting in the room with her daughter and began feeling to her chest was tight and she is beginning to wheeze. Patient states these are the typical symptoms she gets when her asthma flares up". Patient states she usually uses albuterol inhaler on PRN basis but she has run out.  Was recently treated for strep throat-- states those sx resolved.  Denies any other sick contacts, cough, fevers, sweats, or chills.  VS stable.  Past Medical History  Diagnosis Date  . Asthma   . NVD (normal vaginal delivery) 08/30/2010  . Gestational diabetes   . Hx MRSA infection 2005  . Trichimoniasis   . Chlamydia   . Gonorrhea    Past Surgical History  Procedure Laterality Date  . No past surgeries     Family History  Problem Relation Age of Onset  . Anesthesia problems Neg Hx   . Other Neg Hx   . Diabetes Maternal Grandmother   . Hypertension Father   . Heart disease Father   . Lung disease Father    History  Substance Use Topics  . Smoking status: Former Smoker    Types: Cigarettes  . Smokeless tobacco: Never Used  . Alcohol Use: No   OB History   Grav Para Term Preterm Abortions TAB SAB Ect Mult Living   3 3 3       3      Review of Systems  Respiratory: Positive for chest tightness and wheezing.   All other systems reviewed and are negative.    Allergies  Iodine and Sulfonamide derivatives  Home Medications   Current Outpatient Rx  Name  Route  Sig  Dispense  Refill  . HYDROcodone-acetaminophen (HYCET) 7.5-325 mg/15 ml  solution   Oral   Take 10 mLs by mouth every 4 (four) hours as needed for pain.   120 mL   0    BP 130/81  Pulse 84  Temp(Src) 97.5 F (36.4 C) (Oral)  Resp 18  SpO2 100%  LMP 12/12/2012  Physical Exam  Nursing note and vitals reviewed. Constitutional: She is oriented to person, place, and time. She appears well-developed and well-nourished. No distress.  HENT:  Head: Normocephalic and atraumatic.  Right Ear: Tympanic membrane and ear canal normal.  Left Ear: Tympanic membrane and ear canal normal.  Nose: Nose normal.  Mouth/Throat: Uvula is midline, oropharynx is clear and moist and mucous membranes are normal. No oropharyngeal exudate, posterior oropharyngeal edema, posterior oropharyngeal erythema or tonsillar abscesses.  Tonsils normal in appearance bilaterally  Eyes: Conjunctivae and EOM are normal. Pupils are equal, round, and reactive to light.  Neck: Normal range of motion. Neck supple.  Cardiovascular: Normal rate, regular rhythm and normal heart sounds.   Pulmonary/Chest: Effort normal. No respiratory distress. She has no decreased breath sounds. She has wheezes. She has no rhonchi.  Normal work of breathing, slight wheeze in right upper lung field  Musculoskeletal: Normal range of motion.  Neurological: She is alert and oriented to person, place, and time.  Skin: Skin is warm and dry.  She is not diaphoretic.  Psychiatric: She has a normal mood and affect.    ED Course  Procedures (including critical care time) Labs Review Labs Reviewed - No data to display Imaging Review No results found.  EKG Interpretation   None       MDM   1. Wheezing    No fever or cough to suggest pneumonia or bronchitis, only slight wheeze of right upper field.  Offered albuterol neb tx-- pt declined stating it would wake her daughter, prefers inhaler.  4:42 PM Albuterol inhaler given, pt took 2 puffs and states she feels fine.  Lungs CTAB, no wheezing noted.  Pt will be d/c.   Return precautions advised should sx worsen or change.  Garlon Hatchet, PA-C 12/19/12 1755

## 2012-12-19 NOTE — ED Provider Notes (Signed)
  Medical screening examination/treatment/procedure(s) were performed by non-physician practitioner and as supervising physician I was immediately available for consultation/collaboration.  EKG Interpretation   None          Gerhard Munch, MD 12/19/12 2321

## 2012-12-19 NOTE — ED Notes (Signed)
Pt is mother of pt in rm 7.  Pt states that she was sitting in the room with her daughter.  States that she started feeling like her asthma was "acting up".  States her chest got tight.  No distress noted.  100% on RA.

## 2013-01-13 ENCOUNTER — Emergency Department (HOSPITAL_COMMUNITY): Payer: Medicaid Other

## 2013-01-13 ENCOUNTER — Encounter (HOSPITAL_COMMUNITY): Payer: Self-pay | Admitting: Emergency Medicine

## 2013-01-13 ENCOUNTER — Emergency Department (HOSPITAL_COMMUNITY)
Admission: EM | Admit: 2013-01-13 | Discharge: 2013-01-13 | Disposition: A | Discharge status: Home / Self Care | Payer: Medicaid Other | Attending: Emergency Medicine | Admitting: Emergency Medicine

## 2013-01-13 DIAGNOSIS — O9989 Other specified diseases and conditions complicating pregnancy, childbirth and the puerperium: Secondary | ICD-10-CM | POA: Insufficient documentation

## 2013-01-13 DIAGNOSIS — J45901 Unspecified asthma with (acute) exacerbation: Secondary | ICD-10-CM

## 2013-01-13 DIAGNOSIS — Z8614 Personal history of Methicillin resistant Staphylococcus aureus infection: Secondary | ICD-10-CM | POA: Insufficient documentation

## 2013-01-13 DIAGNOSIS — Z87891 Personal history of nicotine dependence: Secondary | ICD-10-CM | POA: Insufficient documentation

## 2013-01-13 DIAGNOSIS — R0789 Other chest pain: Secondary | ICD-10-CM | POA: Insufficient documentation

## 2013-01-13 DIAGNOSIS — Z8632 Personal history of gestational diabetes: Secondary | ICD-10-CM | POA: Insufficient documentation

## 2013-01-13 DIAGNOSIS — O26899 Other specified pregnancy related conditions, unspecified trimester: Secondary | ICD-10-CM

## 2013-01-13 DIAGNOSIS — Z8619 Personal history of other infectious and parasitic diseases: Secondary | ICD-10-CM | POA: Insufficient documentation

## 2013-01-13 DIAGNOSIS — R109 Unspecified abdominal pain: Secondary | ICD-10-CM | POA: Insufficient documentation

## 2013-01-13 LAB — ABO/RH: ABO/RH(D): O POS

## 2013-01-13 LAB — CBC WITH DIFFERENTIAL/PLATELET
Basophils Absolute: 0.1 10*3/uL (ref 0.0–0.1)
Basophils Relative: 1 % (ref 0–1)
Eosinophils Relative: 10 % — ABNORMAL HIGH (ref 0–5)
HCT: 33.5 % — ABNORMAL LOW (ref 36.0–46.0)
Hemoglobin: 11.8 g/dL — ABNORMAL LOW (ref 12.0–15.0)
Lymphocytes Relative: 52 % — ABNORMAL HIGH (ref 12–46)
Lymphs Abs: 4.3 10*3/uL — ABNORMAL HIGH (ref 0.7–4.0)
MCH: 27.8 pg (ref 26.0–34.0)
MCV: 79 fL (ref 78.0–100.0)
Monocytes Absolute: 0.6 10*3/uL (ref 0.1–1.0)
Neutrophils Relative %: 30 % — ABNORMAL LOW (ref 43–77)
RBC: 4.24 MIL/uL (ref 3.87–5.11)

## 2013-01-13 LAB — COMPREHENSIVE METABOLIC PANEL
ALT: 7 U/L (ref 0–35)
AST: 12 U/L (ref 0–37)
Albumin: 3.1 g/dL — ABNORMAL LOW (ref 3.5–5.2)
Alkaline Phosphatase: 63 U/L (ref 39–117)
CO2: 24 mEq/L (ref 19–32)
Calcium: 8.6 mg/dL (ref 8.4–10.5)
Chloride: 100 mEq/L (ref 96–112)
Creatinine, Ser: 0.7 mg/dL (ref 0.50–1.10)
GFR calc Af Amer: >90 mL/min (ref >90)
GFR calc non Af Amer: >90 mL/min (ref >90)
Potassium: 3.8 mEq/L (ref 3.5–5.1)
Sodium: 133 mEq/L — ABNORMAL LOW (ref 135–145)

## 2013-01-13 LAB — WET PREP, GENITAL: Yeast Wet Prep HPF POC: NONE SEEN

## 2013-01-13 LAB — PREGNANCY, URINE: Preg Test, Ur: POSITIVE — AB

## 2013-01-13 LAB — URINALYSIS, ROUTINE W REFLEX MICROSCOPIC
Bilirubin Urine: NEGATIVE
Glucose, UA: NEGATIVE mg/dL
Hgb urine dipstick: NEGATIVE
Protein, ur: NEGATIVE mg/dL
Specific Gravity, Urine: 1.022 (ref 1.005–1.030)

## 2013-01-13 LAB — HCG, QUANTITATIVE, PREGNANCY: hCG, Beta Chain, Quant, S: 3524 m[IU]/mL — ABNORMAL HIGH (ref <5)

## 2013-01-13 IMAGING — US US OB COMP LESS 14 WK
1 series · 14 of 28 positions shown · non-contrast
Comparison: [DATE]

CLINICAL DATA: Pelvic pain.  Early pregnancy.

EXAM:
OBSTETRIC <14 WK US AND TRANSVAGINAL OB US
TECHNIQUE: Both transabdominal and transvaginal ultrasound examinations were
performed for complete evaluation of the gestation as well as the
maternal uterus, adnexal regions, and pelvic cul-de-sac.
Transvaginal technique was performed to assess early pregnancy.

[Series 1: us ob comp less 14 wk · 0.18mm/px · 14 of 36 slices shown]
[im 2/36]
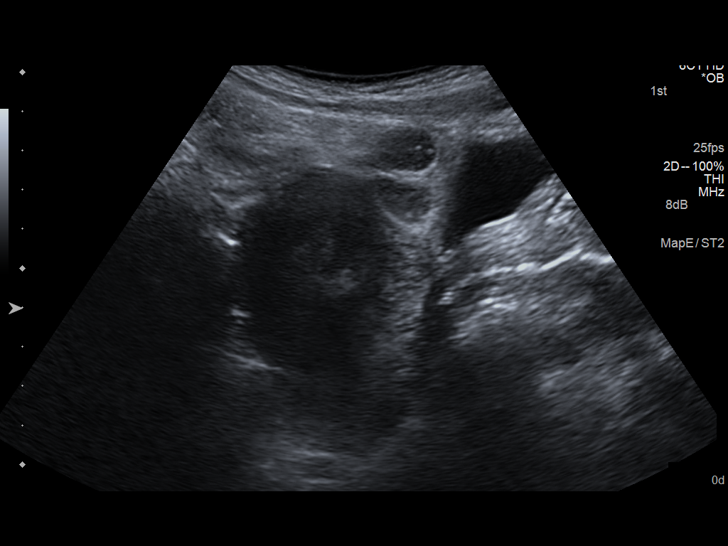
[im 4/36]
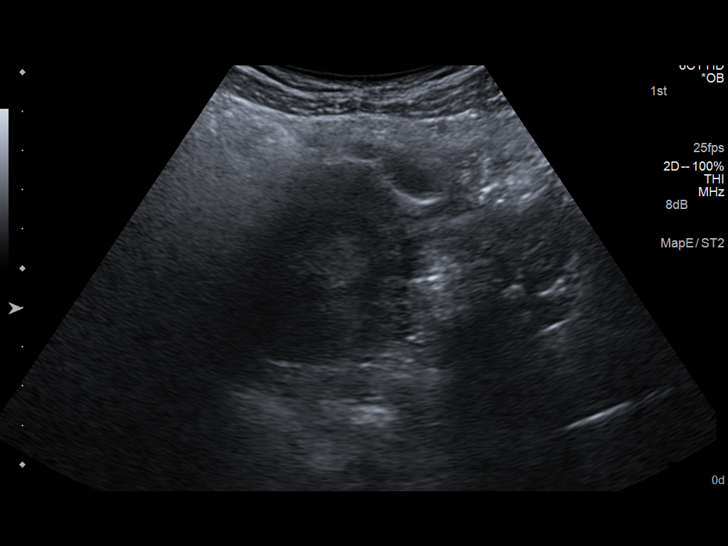
[im 7/36]
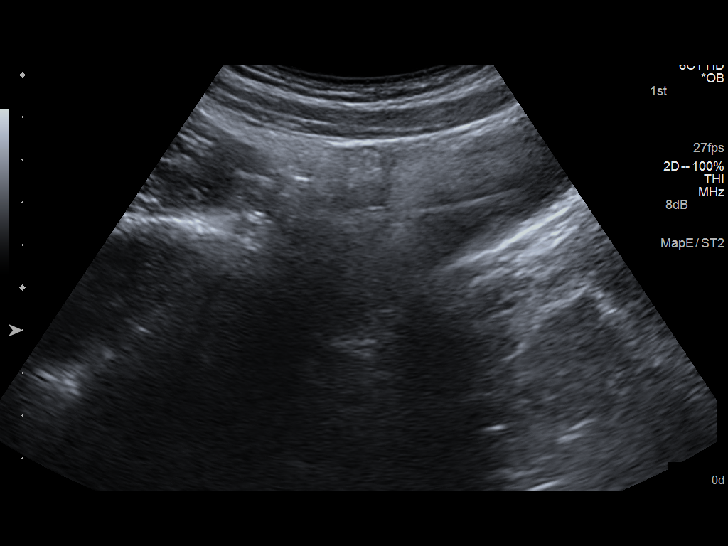
[im 10/36]
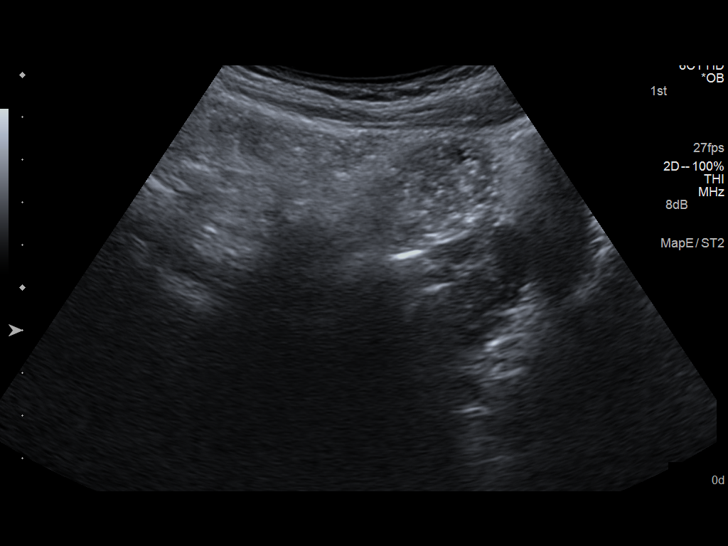
[im 12/36]
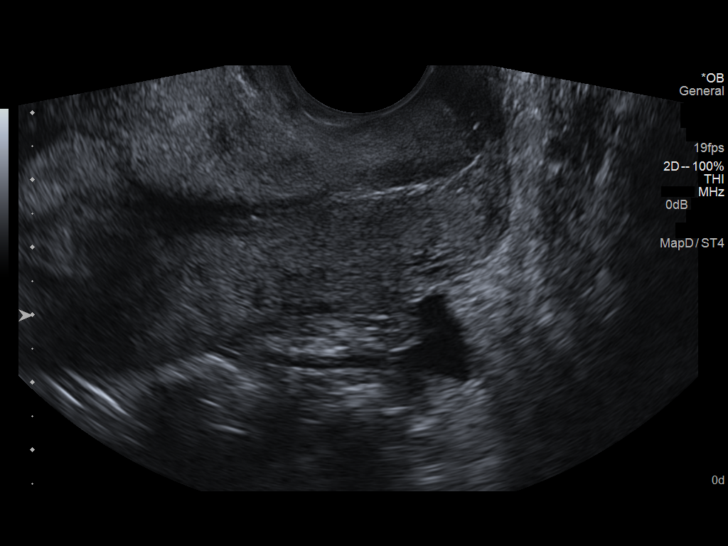
[im 15/36]
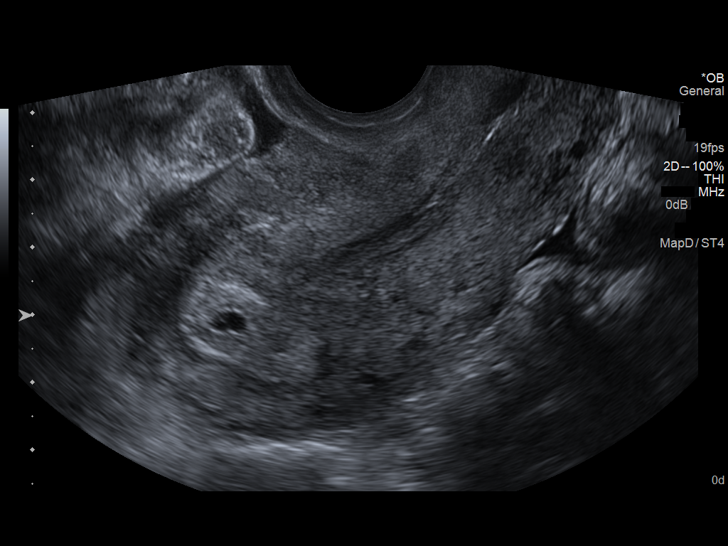
[im 17/36]
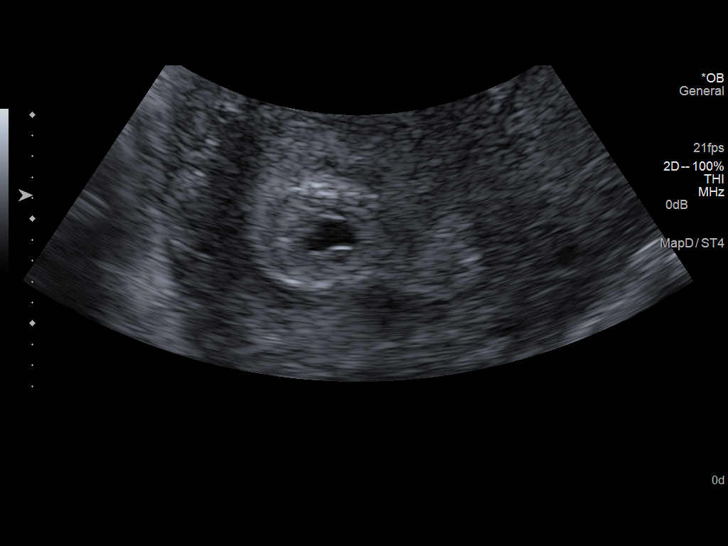
[im 20/36]
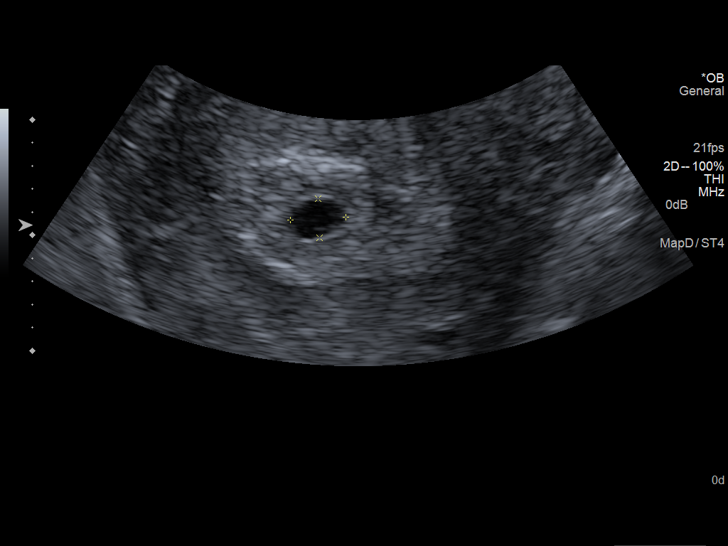
[im 23/36]
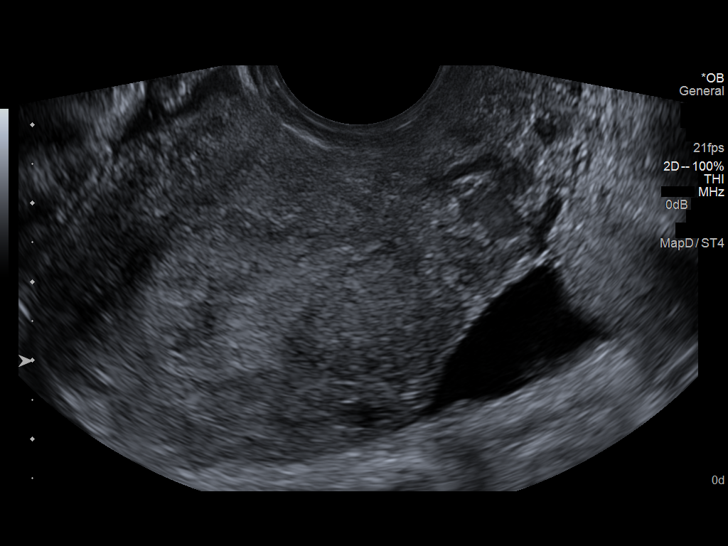
[im 25/36]
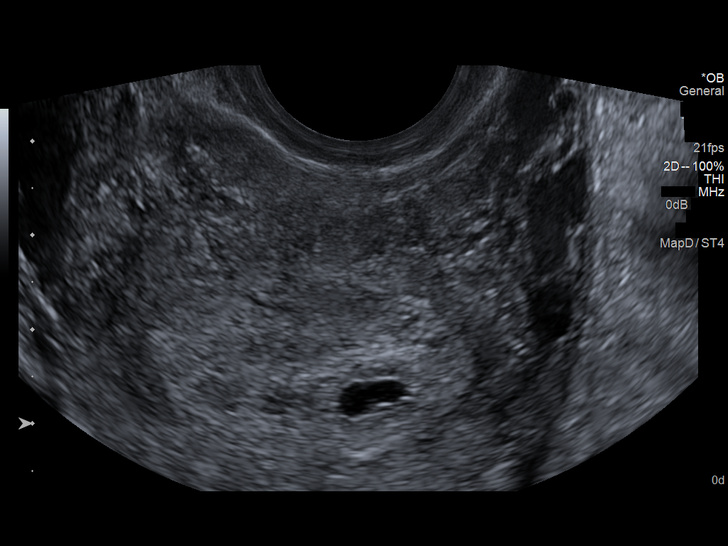
[im 28/36]
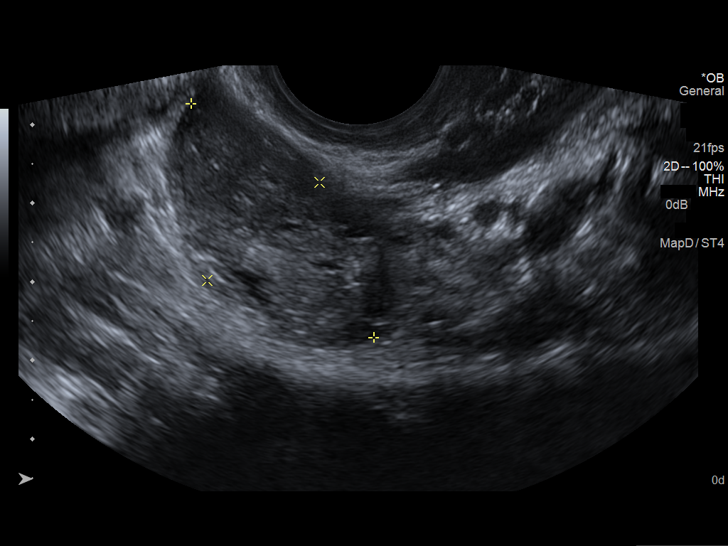
[im 30/36]
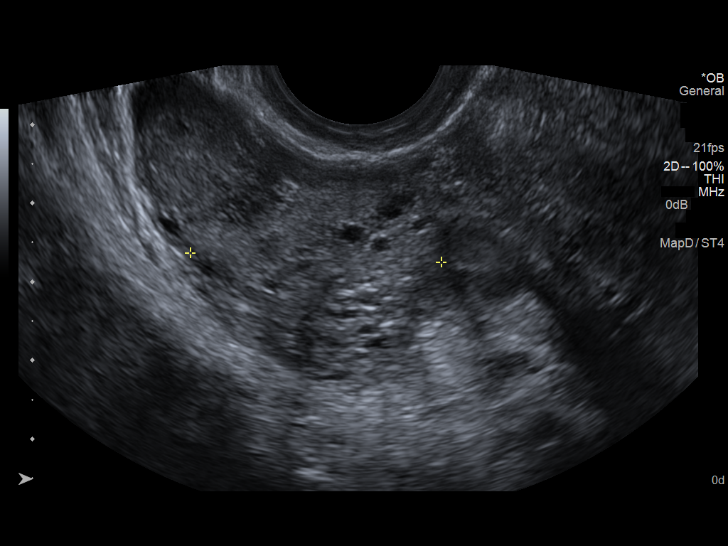
[im 33/36]
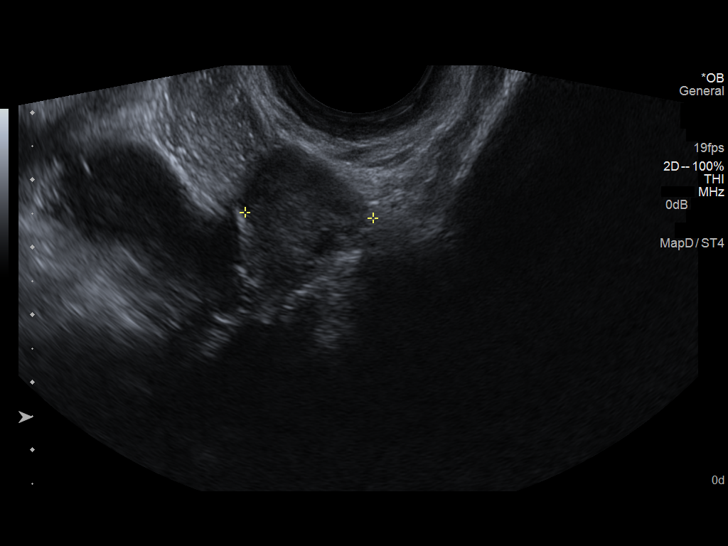
[im 36/36]
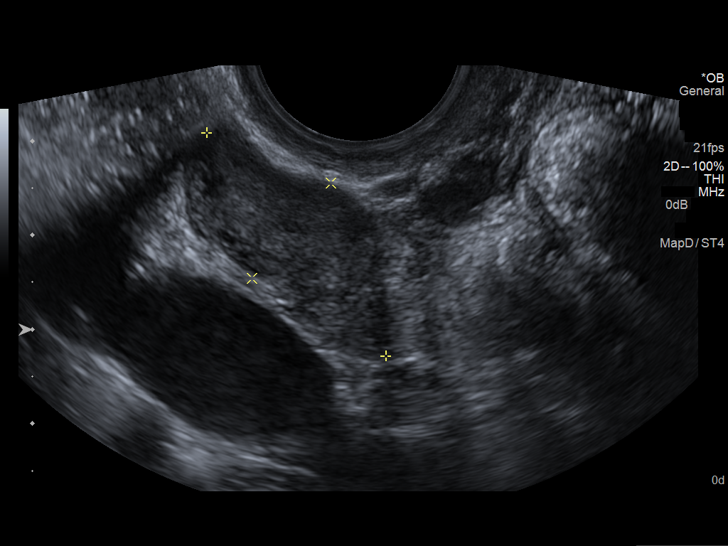

[14 of 28 positions shown; findings below may reference images not displayed]

FINDINGS: Intrauterine gestational sac: Visualized/normal in shape.

Yolk sac:  Present

Embryo:  Not visualized

Cardiac Activity: Not visualized

Heart Rate:   bpm

MSD:  5  mm   5 w   2  d

US EDC: [DATE]

Maternal uterus/adnexae: Maternal ovaries normal. Small amount of
free pelvic fluid.
IMPRESSION: 1. Intrauterine gestational sac with internally at sac. Fetus not
yet visible. Consider follow-up quantitative B-HCG levels and
follow-up US in 14 days to confirm and assess viability.
2. Small amount of free pelvic fluid, most likely incidental.

## 2013-01-13 MED ORDER — IOHEXOL 300 MG/ML  SOLN
50.0000 mL | Freq: Once | INTRAMUSCULAR | Status: AC | PRN
  Administered 2013-01-13: 50 mL via ORAL

## 2013-01-13 MED ORDER — HYDROCODONE-ACETAMINOPHEN 5-325 MG PO TABS: 1.0000 | ORAL_TABLET | ORAL | Status: DC | PRN

## 2013-01-13 MED ORDER — PREDNISONE 20 MG PO TABS: ORAL_TABLET | ORAL | Status: DC

## 2013-01-13 MED ORDER — ALBUTEROL SULFATE (5 MG/ML) 0.5% IN NEBU
5.0000 mg | INHALATION_SOLUTION | Freq: Once | RESPIRATORY_TRACT | Status: AC
  Administered 2013-01-13: 5 mg via RESPIRATORY_TRACT
  Filled 2013-01-13: qty 1

## 2013-01-13 MED ORDER — PREDNISONE 20 MG PO TABS
60.0000 mg | ORAL_TABLET | Freq: Once | ORAL | Status: AC
  Administered 2013-01-13: 60 mg via ORAL
  Filled 2013-01-13: qty 3

## 2013-01-13 MED ORDER — IPRATROPIUM BROMIDE 0.02 % IN SOLN
0.5000 mg | Freq: Once | RESPIRATORY_TRACT | Status: AC
  Administered 2013-01-13: 0.5 mg via RESPIRATORY_TRACT
  Filled 2013-01-13: qty 2.5

## 2013-01-13 MED ORDER — ALBUTEROL SULFATE HFA 108 (90 BASE) MCG/ACT IN AERS
2.0000 | INHALATION_SPRAY | RESPIRATORY_TRACT | Status: DC | PRN
  Filled 2013-01-13: qty 6.7

## 2013-01-13 MED ORDER — ALBUTEROL SULFATE HFA 108 (90 BASE) MCG/ACT IN AERS: 2.0000 | INHALATION_SPRAY | RESPIRATORY_TRACT | Status: DC | PRN

## 2013-01-13 MED ORDER — MORPHINE SULFATE 4 MG/ML IJ SOLN
4.0000 mg | Freq: Once | INTRAMUSCULAR | Status: AC
  Administered 2013-01-13: 4 mg via INTRAVENOUS
  Filled 2013-01-13: qty 1

## 2013-01-13 NOTE — ED Provider Notes (Signed)
Medical screening examination/treatment/procedure(s) were conducted as a shared visit with non-physician practitioner(s) and myself.  I personally evaluated the patient during the encounter.  EKG Interpretation   None        Well appearing. Repeat quant in 48 hours. Ectopic precautions given.   Lyanne Co, MD 01/13/13 [phone]

## 2013-01-13 NOTE — ED Notes (Signed)
Pt states she is having a difficult time with her asthma  Pt states it started about 30 minutes ago  Pt states she is out of her medications at home   Pt states she is having pains in her stomach off and on all week  Pt denies N/V/D

## 2013-01-13 NOTE — ED Provider Notes (Addendum)
Medical screening examination/treatment/procedure(s) were conducted as a shared visit with non-physician practitioner(s) and myself.  I personally evaluated the patient during the encounter.  EKG Interpretation   None        8:47 AM Patient is overall well-appearing.  Her ultrasound demonstrates a gestational sac as well as a yolk sac.  The majority of her complaint is related more to her asthma.  Patient be discharged home to have a repeat hCG obtained in 48 hours.  She will followup at [facility] hospital for this.  Home with albuterol.  No hypoxia.  Patient instructed to not smoke cigarettes, drug or alcohol, prenatal vitamin daily.  She understands return the ER for new or worsening symptoms.  Ectopic precautions were given  Brittany Co, MD 01/13/13 [phone]

## 2013-01-13 NOTE — ED Provider Notes (Signed)
CSN: [id number]     Arrival date & time 01/13/13  0214 History   First MD Initiated Contact with Patient 01/13/13 0226     Chief Complaint  Patient presents with  . Asthma  . Abdominal Pain   (Consider location/radiation/quality/duration/timing/severity/associated sxs/prior Treatment) HPI History provided by pt.   Pt presents w/ multiple complaints.  Woke this morning w/ typical asthma exacerbation.  Sx include cough, wheezing, SOB and severe, diffuse chest tightness.  She is out of both albuterol and qvar and no longer has a prescribing physician.  No recent cough or fever.  Also c/o in intermittent, non-radiating, contraction-like, RLQ abdominal pain for the past week.  No aggravating/alleviating factors.  Pain lasts ~80min before resolving spontaneously, and then returns a couple hours later.  No associated anorexia, N/V/D, urinary or vaginal sx.  No h/o abdominal surgeries.  Past Medical History  Diagnosis Date  . Asthma   . NVD (normal vaginal delivery) 08/30/2010  . Gestational diabetes   . Hx MRSA infection 2005  . Trichimoniasis   . Chlamydia   . Gonorrhea    Past Surgical History  Procedure Laterality Date  . No past surgeries     Family History  Problem Relation Age of Onset  . Anesthesia problems Neg Hx   . Other Neg Hx   . Diabetes Maternal Grandmother   . Hypertension Father   . Heart disease Father   . Lung disease Father    History  Substance Use Topics  . Smoking status: Former Smoker    Types: Cigarettes  . Smokeless tobacco: Never Used  . Alcohol Use: No   OB History   Grav Para Term Preterm Abortions TAB SAB Ect Mult Living   3 3 3       3      Review of Systems  All other systems reviewed and are negative.    Allergies  Iodine and Sulfonamide derivatives  Home Medications  No current outpatient prescriptions on file. BP 124/75  Pulse 97  Temp(Src) 98.2 F (36.8 C) (Oral)  Resp 18  SpO2 100%  LMP 12/03/2012 Physical Exam  Nursing note  and vitals reviewed. Constitutional: She is oriented to person, place, and time. She appears well-developed and well-nourished. No distress.  HENT:  Head: Normocephalic and atraumatic.  Eyes:  Normal appearance  Neck: Normal range of motion.  Cardiovascular: Normal rate and regular rhythm.   Pulmonary/Chest: Effort normal. No respiratory distress.  Diminished breath sounds with mild expiratory wheezing in upper lung fields.  Abdominal: Soft. Bowel sounds are normal. She exhibits no distension and no mass. There is no rebound and no guarding.  Diffuse, mild ttp, reportedly worst in RUQ.  Genitourinary:  No CVA tenderness.  Nml external genitalia.  Large amt thin, white vaginal discharge.  Cervix closed and appears nml.  No adnexal or cervical motion tenderess.    Musculoskeletal: Normal range of motion.  Neurological: She is alert and oriented to person, place, and time.  Skin: Skin is warm and dry. No rash noted.  Psychiatric: She has a normal mood and affect. Her behavior is normal.    ED Course  Procedures (including critical care time) Labs Review Labs Reviewed  WET PREP, GENITAL - Abnormal; Notable for the following:    Clue Cells Wet Prep HPF POC FEW (*)    WBC, Wet Prep HPF POC FEW (*)    All other components within normal limits  CBC WITH DIFFERENTIAL - Abnormal; Notable for the following:  Hemoglobin 11.8 (*)    HCT 33.5 (*)    Neutrophils Relative % 30 (*)    Lymphocytes Relative 52 (*)    Lymphs Abs 4.3 (*)    Eosinophils Relative 10 (*)    Eosinophils Absolute 0.8 (*)    All other components within normal limits  COMPREHENSIVE METABOLIC PANEL - Abnormal; Notable for the following:    Sodium 133 (*)    Albumin 3.1 (*)    All other components within normal limits  PREGNANCY, URINE - Abnormal; Notable for the following:    Preg Test, Ur POSITIVE (*)    All other components within normal limits  HCG, QUANTITATIVE, PREGNANCY - Abnormal; Notable for the following:     hCG, Beta Chain, Quant, S 3524 (*)    All other components within normal limits  GC/CHLAMYDIA PROBE AMP  URINALYSIS, ROUTINE W REFLEX MICROSCOPIC  ABO/RH   Imaging Review No results found.  EKG Interpretation   None       MDM  No diagnosis found.  22yo F presents w/ typical asthma exacerbation.  Breath sounds greatly diminished, no respiratory distress on exam.  Albuterol/atrovent neb and prednisone ordered.  Will reassess shortly.  Also c/o intermittent pain w/ RLQ w/out associated sx x 1 week.  Abd soft/non-distended and diffusely, mildly ttp, reportedly worst in RUQ and pelvic exam unremarkable w/ exception of discharge.  Labs pending.    Breath sounds improved and pt reports feeling better.  3:42 AM     Labs unremarkable.  Results discussed w/ pt.  Her abdominal pain is reportedly increased and she points to RLQ.  On re-examination, continues to have diffuse tenderness, but now worst in RLQ.  Will CT to r/o appendicitis.  Rubye Oaks, PA-C to resume care.  5:32 AM    Pregnancy test positive.  Was mistakenly not ordered following initial exam.  Patient aware of results.  CT cancelled and US OB ordered.      Otilio Miu, PA-C 01/13/13 [phone]

## 2013-01-13 NOTE — ED Provider Notes (Signed)
6:15 AM = Received sign-out from [employer].  Will await results of CT scan and re-evaluate.      Rechecks  8:45 AM = Patient states abdominal pain resolved.  She states "I feel fine."  LNMP 12/03/12.  G4P3.  Her OB/GYN is Dr. Clearance Coots at the [facility].  She denies any vaginal bleeding.   9:00 AM = Repeat abdominal exam benign. Lungs with intermittent inspiratory wheezing throughout.  No dyspnea, chest pain, or SOB.    Vitals Filed Vitals:   01/13/13 0218 01/13/13 0553 01/13/13 0624 01/13/13 0936  BP: 124/75  136/83 108/73  Pulse: 97 84 90 81  Temp: 98.2 F (36.8 C)  97.7 F (36.5 C)   TempSrc: Oral  Oral   Resp: 18  16 18   SpO2: 100% 100% 100% 100%   Results Results for orders placed during the hospital encounter of 01/13/13  WET PREP, GENITAL      Result Value Range   Yeast Wet Prep HPF POC NONE SEEN  NONE SEEN   Trich, Wet Prep NONE SEEN  NONE SEEN   Clue Cells Wet Prep HPF POC FEW (*) NONE SEEN   WBC, Wet Prep HPF POC FEW (*) NONE SEEN  CBC WITH DIFFERENTIAL      Result Value Range   WBC 8.3  4.0 - 10.5 K/uL   RBC 4.24  3.87 - 5.11 MIL/uL   Hemoglobin 11.8 (*) 12.0 - 15.0 g/dL   HCT 40.9 (*) 81.1 - 91.4 %   MCV 79.0  78.0 - 100.0 fL   MCH 27.8  26.0 - 34.0 pg   MCHC 35.2  30.0 - 36.0 g/dL   RDW 78.2  95.6 - 21.3 %   Platelets 230  150 - 400 K/uL   Neutrophils Relative % 30 (*) 43 - 77 %   Neutro Abs 2.5  1.7 - 7.7 K/uL   Lymphocytes Relative 52 (*) 12 - 46 %   Lymphs Abs 4.3 (*) 0.7 - 4.0 K/uL   Monocytes Relative 8  3 - 12 %   Monocytes Absolute 0.6  0.1 - 1.0 K/uL   Eosinophils Relative 10 (*) 0 - 5 %   Eosinophils Absolute 0.8 (*) 0.0 - 0.7 K/uL   Basophils Relative 1  0 - 1 %   Basophils Absolute 0.1  0.0 - 0.1 K/uL  COMPREHENSIVE METABOLIC PANEL      Result Value Range   Sodium 133 (*) 135 - 145 mEq/L   Potassium 3.8  3.5 - 5.1 mEq/L   Chloride 100  96 - 112 mEq/L   CO2 24  19 - 32 mEq/L   Glucose, Bld 94  70 - 99 mg/dL   BUN 11  6 - 23  mg/dL   Creatinine, Ser 0.86  0.50 - 1.10 mg/dL   Calcium 8.6  8.4 - 57.8 mg/dL   Total Protein 7.0  6.0 - 8.3 g/dL   Albumin 3.1 (*) 3.5 - 5.2 g/dL   AST 12  0 - 37 U/L   ALT 7  0 - 35 U/L   Alkaline Phosphatase 63  39 - 117 U/L   Total Bilirubin 0.3  0.3 - 1.2 mg/dL   GFR calc non Af Amer >90  >90 mL/min   GFR calc Af Amer >90  >90 mL/min  URINALYSIS, ROUTINE W REFLEX MICROSCOPIC      Result Value Range   Color, Urine YELLOW  YELLOW   APPearance CLEAR  CLEAR   Specific Gravity,  Urine 1.022  1.005 - 1.030   pH 6.0  5.0 - 8.0   Glucose, UA NEGATIVE  NEGATIVE mg/dL   Hgb urine dipstick NEGATIVE  NEGATIVE   Bilirubin Urine NEGATIVE  NEGATIVE   Ketones, ur NEGATIVE  NEGATIVE mg/dL   Protein, ur NEGATIVE  NEGATIVE mg/dL   Urobilinogen, UA 1.0  0.0 - 1.0 mg/dL   Nitrite NEGATIVE  NEGATIVE   Leukocytes, UA NEGATIVE  NEGATIVE  PREGNANCY, URINE      Result Value Range   Preg Test, Ur POSITIVE (*) NEGATIVE  HCG, QUANTITATIVE, PREGNANCY      Result Value Range   hCG, Beta Chain, Quant, S 3524 (*) <5 mIU/mL  ABO/RH      Result Value Range   ABO/RH(D) O POS       US OB Transvaginal (Final result)  Result time: 01/13/13 08:23:56    Final result by Rad Results In Interface (01/13/13 08:23:56)    Narrative:   CLINICAL DATA: Pelvic pain. Early pregnancy.  EXAM: OBSTETRIC <14 WK [place] AND TRANSVAGINAL OB US  TECHNIQUE: Both transabdominal and transvaginal ultrasound examinations were performed for complete evaluation of the gestation as well as the maternal uterus, adnexal regions, and pelvic cul-de-sac. Transvaginal technique was performed to assess early pregnancy.  COMPARISON: 11/09/2012  FINDINGS: Intrauterine gestational sac: Visualized/normal in shape.  Yolk sac: Present  Embryo: Not visualized  Cardiac Activity: Not visualized  Heart Rate: bpm  MSD: 5 mm 5 w 2 d  [place] EDC: 09/13/2013  Maternal uterus/adnexae: Maternal ovaries normal. Small amount of free pelvic  fluid.  IMPRESSION: 1. Intrauterine gestational sac with internally at sac. Fetus not yet visible. Consider follow-up quantitative B-HCG levels and follow-up US in 14 days to confirm and assess viability. 2. Small amount of free pelvic fluid, most likely incidental.   Electronically Signed By: Herbie Baltimore M.D. On: 01/13/2013 08:23      Etiology of abdominal pain possibly due to new onset pregnancy.  Abdominal pain resolved throughout her ED visit.  Her abdominal exam was benign on my exam.  Her Beta HCG was 3,524 and her US showed a gestational and yolk sac.  Vital signs stable and patient non-toxic in appearance.  Rh positive and she had no complaints of vaginal bleeding.  She was instructed to follow-up with OB/GYN on Monday for a Beta-HCG re-check.  She also was found to have an asthma exacerbation.  She had no dyspnea, wheezing, SOB, or chest pain upon discharge.  Her symptoms improved after prednisone and Duoneb.  I did not feel prednisone OP was necessary at this time.  Patient given prescription for inhaler.  Return precautions were given.  Patient instructed to follow-up with PCP for further evaluation and management of her asthma.      New Prescriptions   ALBUTEROL (PROVENTIL HFA;VENTOLIN HFA) 108 (90 BASE) MCG/ACT INHALER    Inhale 2 puffs into the lungs every 4 (four) hours as needed for wheezing or shortness of breath.     Final impressions: 1. Asthma exacerbation   2. Abdominal pain in pregnancy      Luiz Iron PA-C    Jillyn Ledger, PA-C 01/13/13 1000

## 2013-01-15 ENCOUNTER — Inpatient Hospital Stay (HOSPITAL_COMMUNITY)
Admission: AD | Admit: 2013-01-15 | Discharge: 2013-01-15 | Disposition: A | Discharge status: Home / Self Care | Payer: Medicaid Other | Source: Ambulatory Visit | Attending: Obstetrics & Gynecology | Admitting: Obstetrics & Gynecology

## 2013-01-15 ENCOUNTER — Encounter (HOSPITAL_COMMUNITY): Payer: Self-pay

## 2013-01-15 DIAGNOSIS — Z3200 Encounter for pregnancy test, result unknown: Secondary | ICD-10-CM

## 2013-01-15 DIAGNOSIS — N949 Unspecified condition associated with female genital organs and menstrual cycle: Secondary | ICD-10-CM | POA: Insufficient documentation

## 2013-01-15 DIAGNOSIS — O99891 Other specified diseases and conditions complicating pregnancy: Secondary | ICD-10-CM | POA: Insufficient documentation

## 2013-01-15 DIAGNOSIS — Z3490 Encounter for supervision of normal pregnancy, unspecified, unspecified trimester: Secondary | ICD-10-CM

## 2013-01-15 LAB — HCG, QUANTITATIVE, PREGNANCY: hCG, Beta Chain, Quant, S: 8316 m[IU]/mL — ABNORMAL HIGH (ref <5)

## 2013-01-15 NOTE — MAU Provider Note (Signed)
Attestation of Attending Supervision of Advanced Practitioner (PA/CNM/NP): Evaluation and management procedures were performed by the Advanced Practitioner under my supervision and collaboration.  I have reviewed the Advanced Practitioner's note and chart, and I agree with the management and plan.  Georgio Hattabaugh, MD, FACOG Attending Obstetrician & Gynecologist Faculty Practice, [facility]  

## 2013-01-15 NOTE — MAU Provider Note (Signed)
HPI:  [name] is a 22 y.o. female [phone] [redacted]w[redacted]d who presents for a follow up beta hcg level. She was seen at [facility] long ED two days ago and had an [place] that showed IUP with yolk sac. She was told to follow up here in 2 days for a quant. She originally was seen at [facility] ED for an asthma attack and abdominal pain. Currently she is not having any pain or bleeding.  She plans to start prenatal care at [facility]. Beta Hcg 11/29: 3524 Beta Hcg 12/1: 8316   Objective:  GENERAL: Well-developed, well-nourished female in no acute distress.  HEENT: Normocephalic, atraumatic.   LUNGS: Effort normal HEART: Regular rate  SKIN: Warm, dry and without erythema PSYCH: Normal mood and affect  Filed Vitals:   01/15/13 1154  BP: 120/68  Pulse: 83  Temp: 98.6 F (37 C)  Resp: 18   Results for orders placed during the hospital encounter of 01/15/13 (from the past 24 hour(s))  HCG, QUANTITATIVE, PREGNANCY     Status: Abnormal   Collection Time    01/15/13 11:48 AM      Result Value Range   hCG, Beta Chain, Quant, S 8316 (*) <5 mIU/mL   CLINICAL DATA: Pelvic pain. Early pregnancy.  EXAM:  OBSTETRIC <14 WK [place] AND TRANSVAGINAL OB US  TECHNIQUE:  Both transabdominal and transvaginal ultrasound examinations were  performed for complete evaluation of the gestation as well as the  maternal uterus, adnexal regions, and pelvic cul-de-sac.  Transvaginal technique was performed to assess early pregnancy.  COMPARISON: 11/09/2012  FINDINGS:  Intrauterine gestational sac: Visualized/normal in shape.  Yolk sac: Present  Embryo: Not visualized  Cardiac Activity: Not visualized  Heart Rate: bpm  MSD: 5 mm 5 w 2 d  [place] EDC: 09/13/2013  Maternal uterus/adnexae: Maternal ovaries normal. Small amount of  free pelvic fluid.  IMPRESSION:  1. Intrauterine gestational sac with internally at sac. Fetus not  yet visible. Consider follow-up quantitative B-HCG levels and  follow-up US in 14  days to confirm and assess viability.  2. Small amount of free pelvic fluid, most likely incidental.  Electronically Signed  By: Herbie Baltimore M.D.  On: 01/13/2013 08:23   A:  1. Normal IUP (intrauterine pregnancy) on prenatal ultrasound     P: Discharge home Return to MAU as needed Start prenatal care ASAP  Iona Hansen Thresea Doble, NP 01/15/2013 3:31 PM

## 2013-01-15 NOTE — MAU Note (Signed)
Pt states sent here for f/u BHCG. Went to [facility] two days ago for abd pain, was going to have CT done when realized pt was pregnant. Does have hx of ovarian cyst, and was hurting on same side (right). Denies bleeding, does have a lot of discharge.

## 2013-01-29 ENCOUNTER — Encounter: Payer: Self-pay | Admitting: Obstetrics

## 2013-02-15 NOTE — L&D Delivery Note (Signed)
Patient was C/C/+2 and pushed for <5 minutes with epidural.   NSVD female infant, Apgars and weight pending.   The patient had no lacerations. Fundus was firm. EBL was expected. Placenta was delivered intact. Vagina was clear.  Baby was vigorous and doing skin to skin with mother.  Philip AspenALLAHAN, Vijay Durflinger

## 2013-02-26 ENCOUNTER — Inpatient Hospital Stay (HOSPITAL_COMMUNITY)
Admission: AD | Admit: 2013-02-26 | Discharge: 2013-02-26 | Disposition: A | Discharge status: Home / Self Care | Payer: Medicaid Other | Source: Ambulatory Visit | Attending: Family Medicine | Admitting: Family Medicine

## 2013-02-26 ENCOUNTER — Encounter (HOSPITAL_COMMUNITY): Payer: Self-pay | Admitting: *Deleted

## 2013-02-26 DIAGNOSIS — R519 Headache, unspecified: Secondary | ICD-10-CM

## 2013-02-26 DIAGNOSIS — O9989 Other specified diseases and conditions complicating pregnancy, childbirth and the puerperium: Secondary | ICD-10-CM

## 2013-02-26 DIAGNOSIS — R1031 Right lower quadrant pain: Secondary | ICD-10-CM | POA: Insufficient documentation

## 2013-02-26 DIAGNOSIS — Z87891 Personal history of nicotine dependence: Secondary | ICD-10-CM | POA: Insufficient documentation

## 2013-02-26 DIAGNOSIS — O26891 Other specified pregnancy related conditions, first trimester: Secondary | ICD-10-CM

## 2013-02-26 DIAGNOSIS — R51 Headache: Secondary | ICD-10-CM | POA: Insufficient documentation

## 2013-02-26 DIAGNOSIS — O99891 Other specified diseases and conditions complicating pregnancy: Secondary | ICD-10-CM | POA: Insufficient documentation

## 2013-02-26 LAB — URINALYSIS, ROUTINE W REFLEX MICROSCOPIC
BILIRUBIN URINE: NEGATIVE
Glucose, UA: NEGATIVE mg/dL
HGB URINE DIPSTICK: NEGATIVE
Ketones, ur: 15 mg/dL — AB
Leukocytes, UA: NEGATIVE
Nitrite: NEGATIVE
PH: 6 (ref 5.0–8.0)
Protein, ur: NEGATIVE mg/dL
Urobilinogen, UA: 1 mg/dL (ref 0.0–1.0)

## 2013-02-26 MED ORDER — BUTALBITAL-APAP-CAFFEINE 50-325-40 MG PO TABS: 1.0000 | ORAL_TABLET | Freq: Four times a day (QID) | ORAL | Status: DC | PRN

## 2013-02-26 MED ORDER — ALBUTEROL SULFATE HFA 108 (90 BASE) MCG/ACT IN AERS
1.0000 | INHALATION_SPRAY | Freq: Once | RESPIRATORY_TRACT | Status: AC
  Administered 2013-02-26: 2 via RESPIRATORY_TRACT
  Filled 2013-02-26: qty 6.7

## 2013-02-26 MED ORDER — BUTALBITAL-APAP-CAFFEINE 50-325-40 MG PO TABS
2.0000 | ORAL_TABLET | Freq: Once | ORAL | Status: AC
  Administered 2013-02-26: 2 via ORAL
  Filled 2013-02-26: qty 2

## 2013-02-26 NOTE — MAU Note (Signed)
Pt states she has taken two aleve tablets @ 1900 without relief of headache.  Denies diarrhea,  Vomiting, or dysuria.Has normal bowel movements.

## 2013-02-26 NOTE — Discharge Instructions (Signed)
Pregnancy - First Trimester  During sexual intercourse, millions of sperm go into the vagina. Only 1 sperm will penetrate and fertilize the female egg while it is in the Fallopian tube. One week later, the fertilized egg implants into the wall of the uterus. An embryo begins to develop into a baby. At 6 to 8 weeks, the eyes and face are formed and the heartbeat can be seen on ultrasound. At the end of 12 weeks (first trimester), all the baby's organs are formed. Now that you are pregnant, you will want to do everything you can to have a healthy baby. Two of the most important things are to get good prenatal care and follow your caregiver's instructions. Prenatal care is all the medical care you receive before the baby's birth. It is given to prevent, find, and treat problems during the pregnancy and childbirth.  PRENATAL EXAMS  · During prenatal visits, your weight, blood pressure, and urine are checked. This is done to make sure you are healthy and progressing normally during the pregnancy.  · A pregnant woman should gain 25 to 35 pounds during the pregnancy. However, if you are overweight or underweight, your caregiver will advise you regarding your weight.  · Your caregiver will ask and answer questions for you.  · Blood work, cervical cultures, other necessary tests, and a Pap test are done during your prenatal exams. These tests are done to check on your health and the probable health of your baby. Tests are strongly recommended and done for HIV with your permission. This is the virus that causes AIDS. These tests are done because medicines can be given to help prevent your baby from being born with this infection should you have been infected without knowing it. Blood work is also used to find out your blood type, previous infections, and follow your blood levels (hemoglobin).  · Low hemoglobin (anemia) is common during pregnancy. Iron and vitamins are given to help prevent this. Later in the pregnancy, blood  tests for diabetes will be done along with any other tests if any problems develop.  · You may need other tests to make sure you and the baby are doing well.  CHANGES DURING THE FIRST TRIMESTER   Your body goes through many changes during pregnancy. They vary from person to person. Talk to your caregiver about changes you notice and are concerned about. Changes can include:  · Your menstrual period stops.  · The egg and sperm carry the genes that determine what you look like. Genes from you and your partner are forming a baby. The female genes determine whether the baby is a boy or a girl.  · Your body increases in girth and you may feel bloated.  · Feeling sick to your stomach (nauseous) and throwing up (vomiting). If the vomiting is uncontrollable, call your caregiver.  · Your breasts will begin to enlarge and become tender.  · Your nipples may stick out more and become darker.  · The need to urinate more. Painful urination may mean you have a bladder infection.  · Tiring easily.  · Loss of appetite.  · Cravings for certain kinds of food.  · At first, you may gain or lose a couple of pounds.  · You may have changes in your emotions from day to day (excited to be pregnant or concerned something may go wrong with the pregnancy and baby).  · You may have more vivid and strange dreams.  HOME CARE INSTRUCTIONS   ·   It is very important to avoid all smoking, alcohol and non-prescribed drugs during your pregnancy. These affect the formation and growth of the baby. Avoid chemicals while pregnant to ensure the delivery of a healthy infant.  · Start your prenatal visits by the 12th week of pregnancy. They are usually scheduled monthly at first, then more often in the last 2 months before delivery. Keep your caregiver's appointments. Follow your caregiver's instructions regarding medicine use, blood and lab tests, exercise, and diet.  · During pregnancy, you are providing food for you and your baby. Eat regular, well-balanced  meals. Choose foods such as meat, fish, milk and other low fat dairy products, vegetables, fruits, and whole-grain breads and cereals. Your caregiver will tell you of the ideal weight gain.  · You can help morning sickness by keeping soda crackers at the bedside. Eat a couple before arising in the morning. You may want to use the crackers without salt on them.  · Eating 4 to 5 small meals rather than 3 large meals a day also may help the nausea and vomiting.  · Drinking liquids between meals instead of during meals also seems to help nausea and vomiting.  · A physical sexual relationship may be continued throughout pregnancy if there are no other problems. Problems may be early (premature) leaking of amniotic fluid from the membranes, vaginal bleeding, or belly (abdominal) pain.  · Exercise regularly if there are no restrictions. Check with your caregiver or physical therapist if you are unsure of the safety of some of your exercises. Greater weight gain will occur in the last 2 trimesters of pregnancy. Exercising will help:  · Control your weight.  · Keep you in shape.  · Prepare you for labor and delivery.  · Help you lose your pregnancy weight after you deliver your baby.  · Wear a good support or jogging bra for breast tenderness during pregnancy. This may help if worn during sleep too.  · Ask when prenatal classes are available. Begin classes when they are offered.  · Do not use hot tubs, steam rooms, or saunas.  · Wear your seat belt when driving. This protects you and your baby if you are in an accident.  · Avoid raw meat, uncooked cheese, cat litter boxes, and soil used by cats throughout the pregnancy. These carry germs that can cause birth defects in the baby.  · The first trimester is a good time to visit your dentist for your dental health. Getting your teeth cleaned is okay. Use a softer toothbrush and brush gently during pregnancy.  · Ask for help if you have financial, counseling, or nutritional needs  during pregnancy. Your caregiver will be able to offer counseling for these needs as well as refer you for other special needs.  · Do not take any medicines or herbs unless told by your caregiver.  · Inform your caregiver if there is any mental or physical domestic violence.  · Make a list of emergency phone numbers of family, friends, hospital, and police and fire departments.  · Write down your questions. Take them to your prenatal visit.  · Do not douche.  · Do not cross your legs.  · If you have to stand for long periods of time, rotate you feet or take small steps in a circle.  · You may have more vaginal secretions that may require a sanitary pad. Do not use tampons or scented sanitary pads.  MEDICINES AND DRUG USE IN PREGNANCY  ·   Take prenatal vitamins as directed. The vitamin should contain 1 milligram of folic acid. Keep all vitamins out of reach of children. Only a couple vitamins or tablets containing iron may be fatal to a baby or young child when ingested.  · Avoid use of all medicines, including herbs, over-the-counter medicines, not prescribed or suggested by your caregiver. Only take over-the-counter or prescription medicines for pain, discomfort, or fever as directed by your caregiver. Do not use aspirin, ibuprofen, or naproxen unless directed by your caregiver.  · Let your caregiver also know about herbs you may be using.  · Alcohol is related to a number of birth defects. This includes fetal alcohol syndrome. All alcohol, in any form, should be avoided completely. Smoking will cause low birth rate and premature babies.  · Street or illegal drugs are very harmful to the baby. They are absolutely forbidden. A baby born to an addicted mother will be addicted at birth. The baby will go through the same withdrawal an adult does.  · Let your caregiver know about any medicines that you have to take and for what reason you take them.  SEEK MEDICAL CARE IF:   You have any concerns or worries during your  pregnancy. It is better to call with your questions if you feel they cannot wait, rather than worry about them.  SEEK IMMEDIATE MEDICAL CARE IF:   · An unexplained oral temperature above 102° F (38.9° C) develops, or as your caregiver suggests.  · You have leaking of fluid from the vagina (birth canal). If leaking membranes are suspected, take your temperature and inform your caregiver of this when you call.  · There is vaginal spotting or bleeding. Notify your caregiver of the amount and how many pads are used.  · You develop a bad smelling vaginal discharge with a change in the color.  · You continue to feel sick to your stomach (nauseated) and have no relief from remedies suggested. You vomit blood or coffee ground-like materials.  · You lose more than 2 pounds of weight in 1 week.  · You gain more than 2 pounds of weight in 1 week and you notice swelling of your face, hands, feet, or legs.  · You gain 5 pounds or more in 1 week (even if you do not have swelling of your hands, face, legs, or feet).  · You get exposed to German measles and have never had them.  · You are exposed to fifth disease or chickenpox.  · You develop belly (abdominal) pain. Round ligament discomfort is a common non-cancerous (benign) cause of abdominal pain in pregnancy. Your caregiver still must evaluate this.  · You develop headache, fever, diarrhea, pain with urination, or shortness of breath.  · You fall or are in a car accident or have any kind of trauma.  · There is mental or physical violence in your home.  Document Released: 01/26/2001 Document Revised: 10/27/2011 Document Reviewed: 07/30/2008  [employer]® Patient Information ©2014 [employer].

## 2013-02-26 NOTE — MAU Note (Signed)
Pt reports she has been having headaches off/on for a week. Nausea. Rt lower abd pain , was seen at [facility] in November for same pain and the pain continues off/on. States she has a history of asthma and her chest feels tight and she is out of medicine.

## 2013-02-26 NOTE — MAU Provider Note (Signed)
History     CSN: [id number]  Arrival date and time: 02/26/13 2050   First Provider Initiated Contact with Patient 02/26/13 2127      Chief Complaint  Patient presents with  . Headache  . Abdominal Pain  . Asthma   HPI  Brittany [name] is a 23 y.o. [id number] at [id number] who presents today with right lower abdominal pain that she has had since the beginning of the pregnancy. She also has headaches that won't go away. She denies any hx of migraines or other types of headaches. She has tried [employer]ale[employer] today, and it did not help the headache. She has had nausea, but denies any vomiting.   Patient also reports a history of asthma, and states that she currently "feels tight". She states that she ran out of her inhaler, and has been using her nephews nebulizer for the last few days. She states that she has been using about a 1/2 of a treatment twice a day.   Past Medical History  Diagnosis Date  . Asthma   . NVD (normal vaginal delivery) 08/30/2010  . Gestational diabetes   . Hx MRSA infection 2005  . Trichimoniasis   . Chlamydia   . Gonorrhea     Past Surgical History  Procedure Laterality Date  . No past surgeries    . Multiple tooth extractions      Family History  Problem Relation Age of Onset  . Anesthesia problems Neg Hx   . Other Neg Hx   . Diabetes Maternal Grandmother   . Hypertension Father   . Heart disease Father   . Lung disease Father     History  Substance Use Topics  . Smoking status: Former Smoker -- 0.25 packs/day for 5 years    Types: Cigarettes    Quit date: 12/27/2012  . Smokeless tobacco: Never Used  . Alcohol Use: No    Allergies:  Allergies  Allergen Reactions  . Iodine Other (See Comments)    blistering  . Sulfonamide Derivatives Other (See Comments)    blistering    Prescriptions prior to admission  Medication Sig Dispense Refill  . naproxen sodium (ANAPROX) 220 MG tablet Take 440 mg by mouth daily as needed.      Marland Kitchen. albuterol  (PROVENTIL HFA;VENTOLIN HFA) 108 (90 BASE) MCG/ACT inhaler Inhale 2 puffs into the lungs every 4 (four) hours as needed for wheezing or shortness of breath.  1 Inhaler  0    ROS Physical Exam   Blood pressure 111/67, pulse 86, temperature 98.5 F (36.9 C), temperature source Oral, resp. rate 20, height 5' 3.5" (1.613 m), weight 77.111 kg (170 lb), last menstrual period 12/03/2012, SpO2 100.00%.  Physical Exam  Nursing note and vitals reviewed. Constitutional: She is oriented to person, place, and time. She appears well-developed and well-nourished. No distress.  Cardiovascular: Normal rate.   Respiratory: Effort normal.  GI: Soft. There is no tenderness.  Neurological: She is alert and oriented to person, place, and time.  Skin: Skin is warm and dry.  Psychiatric: She has a normal mood and affect.    MAU Course  Procedures Results for orders placed during the hospital encounter of 02/26/13 (from the past 24 hour(s))  URINALYSIS, ROUTINE W REFLEX MICROSCOPIC     Status: Abnormal   Collection Time    02/26/13  9:04 PM      Result Value Range   Color, Urine YELLOW  YELLOW   APPearance CLEAR  CLEAR   Specific Gravity,  Urine >1.030 (*) 1.005 - 1.030   pH 6.0  5.0 - 8.0   Glucose, UA NEGATIVE  NEGATIVE mg/dL   Hgb urine dipstick NEGATIVE  NEGATIVE   Bilirubin Urine NEGATIVE  NEGATIVE   Ketones, ur 15 (*) NEGATIVE mg/dL   Protein, ur NEGATIVE  NEGATIVE mg/dL   Urobilinogen, UA 1.0  0.0 - 1.0 mg/dL   Nitrite NEGATIVE  NEGATIVE   Leukocytes, UA NEGATIVE  NEGATIVE    2245: Patient reports some improvement with headache meds. She is sitting in the room with all the lights on reading a magazine, and talking on the phone.  Assessment and Plan   1. Headache in pregnancy, antepartum, first trimester      Medication List    STOP taking these medications       naproxen sodium 220 MG tablet  Commonly known as:  ANAPROX      TAKE these medications       albuterol 108 (90 BASE)  MCG/ACT inhaler  Commonly known as:  PROVENTIL HFA;VENTOLIN HFA  Inhale 2 puffs into the lungs every 4 (four) hours as needed for wheezing or shortness of breath.     butalbital-acetaminophen-caffeine 50-325-40 MG per tablet  Commonly known as:  FIORICET  Take 1-2 tablets by mouth every 6 (six) hours as needed for headache.       Follow-up Information   Schedule an appointment as soon as possible for a visit with [facility].   Specialty:  Obstetrics and Gynecology   Contact information:   [address], [address] [place] [address] [phone]       Tawnya Crook 02/26/2013, 9:28 PM

## 2013-02-27 NOTE — MAU Provider Note (Signed)
Attestation of Attending Supervision of Advanced Practitioner (PA/CNM/NP): Evaluation and management procedures were performed by the Advanced Practitioner under my supervision and collaboration.  I have reviewed the Advanced Practitioner's note and chart, and I agree with the management and plan.  Daxton Nydam S, MD [facility] Attending 02/27/2013 12:48 AM   

## 2013-03-03 ENCOUNTER — Encounter (HOSPITAL_COMMUNITY): Payer: Self-pay | Admitting: Emergency Medicine

## 2013-03-03 ENCOUNTER — Emergency Department (HOSPITAL_COMMUNITY)
Admission: EM | Admit: 2013-03-03 | Discharge: 2013-03-03 | Disposition: A | Discharge status: Home / Self Care | Payer: Medicaid Other | Attending: Emergency Medicine | Admitting: Emergency Medicine

## 2013-03-03 DIAGNOSIS — Z79899 Other long term (current) drug therapy: Secondary | ICD-10-CM | POA: Insufficient documentation

## 2013-03-03 DIAGNOSIS — O9989 Other specified diseases and conditions complicating pregnancy, childbirth and the puerperium: Secondary | ICD-10-CM | POA: Insufficient documentation

## 2013-03-03 DIAGNOSIS — R000 Tachycardia, unspecified: Secondary | ICD-10-CM | POA: Insufficient documentation

## 2013-03-03 DIAGNOSIS — Z8614 Personal history of Methicillin resistant Staphylococcus aureus infection: Secondary | ICD-10-CM | POA: Insufficient documentation

## 2013-03-03 DIAGNOSIS — Z8619 Personal history of other infectious and parasitic diseases: Secondary | ICD-10-CM | POA: Insufficient documentation

## 2013-03-03 DIAGNOSIS — J45901 Unspecified asthma with (acute) exacerbation: Secondary | ICD-10-CM

## 2013-03-03 DIAGNOSIS — Z87891 Personal history of nicotine dependence: Secondary | ICD-10-CM | POA: Insufficient documentation

## 2013-03-03 DIAGNOSIS — Z8632 Personal history of gestational diabetes: Secondary | ICD-10-CM | POA: Insufficient documentation

## 2013-03-03 MED ORDER — ALBUTEROL SULFATE (2.5 MG/3ML) 0.083% IN NEBU
5.0000 mg | INHALATION_SOLUTION | Freq: Once | RESPIRATORY_TRACT | Status: AC
  Administered 2013-03-03: 5 mg via RESPIRATORY_TRACT
  Filled 2013-03-03: qty 6

## 2013-03-03 MED ORDER — ALBUTEROL SULFATE HFA 108 (90 BASE) MCG/ACT IN AERS
2.0000 | INHALATION_SPRAY | RESPIRATORY_TRACT | Status: DC | PRN
  Administered 2013-03-03: 2 via RESPIRATORY_TRACT
  Filled 2013-03-03 (×3): qty 6.7

## 2013-03-03 MED ORDER — PREDNISONE 20 MG PO TABS: 60.0000 mg | ORAL_TABLET | Freq: Once | ORAL | Status: DC

## 2013-03-03 MED ORDER — IPRATROPIUM BROMIDE 0.02 % IN SOLN
0.5000 mg | Freq: Once | RESPIRATORY_TRACT | Status: AC
  Administered 2013-03-03: 0.5 mg via RESPIRATORY_TRACT
  Filled 2013-03-03: qty 2.5

## 2013-03-03 NOTE — ED Provider Notes (Signed)
Medical screening examination/treatment/procedure(s) were performed by non-physician practitioner and as supervising physician I was immediately available for consultation/collaboration.   Bobby Barton R Namari Breton, MD 03/03/13 1618 

## 2013-03-03 NOTE — ED Notes (Signed)
She c/o wheezing and mild "tightness" of her breathing x 2-3 days.  She states she has hx of asthma.  She is in no distress.

## 2013-03-03 NOTE — Discharge Instructions (Signed)

## 2013-03-03 NOTE — ED Provider Notes (Signed)
CSN: [id number]     Arrival date & time 03/03/13  0930 History   First MD Initiated Contact with Patient 03/03/13 1011     Chief Complaint  Patient presents with  . Wheezing   (Consider location/radiation/quality/duration/timing/severity/associated sxs/prior Treatment) HPI  23 year old female with history of asthma who is 3 months pregnant presents complaining of shortness of breath and wheezing ongoing for the past 2-3 days. Patient states she has seasonal asthma which she used her rescue inhaler. Sure enough her inhaler 2 days ago. States since being out of the cold weather has also had increased shortness of breath especially at night. Report occasional cough but denies fever, chills, runny nose, sneezing, sore throat, or rash. Denies nausea vomiting or diarrhea. No prior complication of asthma requiring ICU stay or intubation. She is here requesting for a refill of her rescue inhaler.  Past Medical History  Diagnosis Date  . Asthma   . NVD (normal vaginal delivery) 08/30/2010  . Gestational diabetes   . Hx MRSA infection 2005  . Trichimoniasis   . Chlamydia   . Gonorrhea    Past Surgical History  Procedure Laterality Date  . No past surgeries    . Multiple tooth extractions     Family History  Problem Relation Age of Onset  . Anesthesia problems Neg Hx   . Other Neg Hx   . Diabetes Maternal Grandmother   . Hypertension Father   . Heart disease Father   . Lung disease Father    History  Substance Use Topics  . Smoking status: Former Smoker -- 0.25 packs/day for 5 years    Types: Cigarettes    Quit date: 12/27/2012  . Smokeless tobacco: Never Used  . Alcohol Use: No   OB History   Grav Para Term Preterm Abortions TAB SAB Ect Mult Living   4 3 3       3      Review of Systems  All other systems reviewed and are negative.    Allergies  Iodine and Sulfonamide derivatives  Home Medications   Current Outpatient Rx  Name  Route  Sig  Dispense  Refill  .  albuterol (PROVENTIL HFA;VENTOLIN HFA) 108 (90 BASE) MCG/ACT inhaler   Inhalation   Inhale 2 puffs into the lungs every 4 (four) hours as needed for wheezing or shortness of breath.   1 Inhaler   0   . butalbital-acetaminophen-caffeine (FIORICET) 50-325-40 MG per tablet   Oral   Take 1-2 tablets by mouth every 6 (six) hours as needed for headache.   20 tablet   0    BP 116/76  Pulse 111  Temp(Src) 98.4 F (36.9 C) (Oral)  Resp 18  SpO2 99%  LMP 12/03/2012 Physical Exam  Nursing note and vitals reviewed. Constitutional: She is oriented to person, place, and time. She appears well-developed and well-nourished. No distress.  HENT:  Head: Atraumatic.  Right Ear: External ear normal.  Left Ear: External ear normal.  Nose: Nose normal.  Mouth/Throat: Oropharynx is clear and moist. No oropharyngeal exudate.  Eyes: Conjunctivae are normal.  Neck: Neck supple.  Cardiovascular:  Mild tachycardia without murmurs, rubs, or gallops noted  Pulmonary/Chest: No respiratory distress. She has wheezes (Mild expiratory wheezes heard.). She has no rales. She exhibits no tenderness.  Abdominal: Soft. There is no tenderness.  Neurological: She is alert and oriented to person, place, and time.  Skin: No rash noted.  Psychiatric: She has a normal mood and affect.    ED  Course  Procedures (including critical care time)  Patient with history of asthma who ran out of her rescue inhaler presents with mild wheezing. She is also currently pregnant. Will give breathing treatments and will refill her inhaler. Since prednisone is a category C. for pregnancy I would not prescribe this medication due to increased risk of both complication including cleft palate. No significant URI symptoms. Low suspicion for pneumonia. Low suspicion for PE.  Labs Review Labs Reviewed - No data to display Imaging Review No results found.  EKG Interpretation   None       MDM   1. Asthma exacerbation    BP  116/76  Pulse 111  Temp(Src) 98.4 F (36.9 C) (Oral)  Resp 18  SpO2 100%  LMP 12/03/2012      Fayrene Helper, PA-C 03/03/13 1113

## 2013-04-11 ENCOUNTER — Other Ambulatory Visit: Payer: Self-pay | Admitting: Obstetrics & Gynecology

## 2013-04-11 LAB — OB RESULTS CONSOLE ANTIBODY SCREEN: Antibody Screen: NEGATIVE

## 2013-04-11 LAB — OB RESULTS CONSOLE ABO/RH: RH TYPE: POSITIVE

## 2013-04-11 LAB — OB RESULTS CONSOLE RUBELLA ANTIBODY, IGM: RUBELLA: IMMUNE

## 2013-04-11 LAB — OB RESULTS CONSOLE GC/CHLAMYDIA
Chlamydia: NEGATIVE
Gonorrhea: NEGATIVE

## 2013-04-11 LAB — OB RESULTS CONSOLE HEPATITIS B SURFACE ANTIGEN: Hepatitis B Surface Ag: NEGATIVE

## 2013-04-11 LAB — OB RESULTS CONSOLE RPR: RPR: NONREACTIVE

## 2013-04-11 LAB — OB RESULTS CONSOLE HIV ANTIBODY (ROUTINE TESTING): HIV: NONREACTIVE

## 2013-05-01 ENCOUNTER — Inpatient Hospital Stay (HOSPITAL_COMMUNITY)
Admission: AD | Admit: 2013-05-01 | Discharge: 2013-05-01 | Discharge status: Against Medical Advice | Payer: Medicaid Other | Source: Ambulatory Visit | Attending: Obstetrics and Gynecology | Admitting: Obstetrics and Gynecology

## 2013-05-01 DIAGNOSIS — O26859 Spotting complicating pregnancy, unspecified trimester: Secondary | ICD-10-CM | POA: Insufficient documentation

## 2013-05-01 DIAGNOSIS — R109 Unspecified abdominal pain: Secondary | ICD-10-CM | POA: Insufficient documentation

## 2013-05-01 DIAGNOSIS — Z5320 Procedure and treatment not carried out because of patient's decision for unspecified reasons: Secondary | ICD-10-CM | POA: Insufficient documentation

## 2013-05-01 NOTE — MAU Note (Signed)
Patient is not in the lobby when called to triage.  
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
## 2013-05-01 NOTE — MAU Note (Signed)
Pt not in lobby at 2105, 2135, or 2150

## 2013-06-12 ENCOUNTER — Institutional Professional Consult (permissible substitution): Payer: Medicaid Other | Admitting: Pulmonary Disease

## 2013-06-28 ENCOUNTER — Inpatient Hospital Stay (HOSPITAL_COMMUNITY)
Admission: AD | Admit: 2013-06-28 | Discharge: 2013-06-29 | Disposition: A | Discharge status: Home / Self Care | Payer: Medicaid Other | Source: Ambulatory Visit | Attending: Obstetrics and Gynecology | Admitting: Obstetrics and Gynecology

## 2013-06-28 ENCOUNTER — Encounter (HOSPITAL_COMMUNITY): Payer: Self-pay | Admitting: *Deleted

## 2013-06-28 DIAGNOSIS — W57XXXA Bitten or stung by nonvenomous insect and other nonvenomous arthropods, initial encounter: Secondary | ICD-10-CM

## 2013-06-28 DIAGNOSIS — Z8614 Personal history of Methicillin resistant Staphylococcus aureus infection: Secondary | ICD-10-CM | POA: Insufficient documentation

## 2013-06-28 DIAGNOSIS — O2390 Unspecified genitourinary tract infection in pregnancy, unspecified trimester: Secondary | ICD-10-CM | POA: Insufficient documentation

## 2013-06-28 DIAGNOSIS — O368190 Decreased fetal movements, unspecified trimester, not applicable or unspecified: Secondary | ICD-10-CM | POA: Insufficient documentation

## 2013-06-28 DIAGNOSIS — S90569A Insect bite (nonvenomous), unspecified ankle, initial encounter: Secondary | ICD-10-CM | POA: Insufficient documentation

## 2013-06-28 DIAGNOSIS — N390 Urinary tract infection, site not specified: Secondary | ICD-10-CM | POA: Insufficient documentation

## 2013-06-28 DIAGNOSIS — O2343 Unspecified infection of urinary tract in pregnancy, third trimester: Secondary | ICD-10-CM

## 2013-06-28 DIAGNOSIS — O368130 Decreased fetal movements, third trimester, not applicable or unspecified: Secondary | ICD-10-CM

## 2013-06-28 DIAGNOSIS — Z87891 Personal history of nicotine dependence: Secondary | ICD-10-CM | POA: Insufficient documentation

## 2013-06-28 LAB — URINE MICROSCOPIC-ADD ON

## 2013-06-28 LAB — URINALYSIS, ROUTINE W REFLEX MICROSCOPIC
Bilirubin Urine: NEGATIVE
Glucose, UA: NEGATIVE mg/dL
HGB URINE DIPSTICK: NEGATIVE
Ketones, ur: NEGATIVE mg/dL
LEUKOCYTES UA: NEGATIVE
Nitrite: POSITIVE — AB
PROTEIN: NEGATIVE mg/dL
Urobilinogen, UA: 0.2 mg/dL (ref 0.0–1.0)
pH: 6 (ref 5.0–8.0)

## 2013-06-28 MED ORDER — NITROFURANTOIN MONOHYD MACRO 100 MG PO CAPS: 100.0000 mg | ORAL_CAPSULE | Freq: Two times a day (BID) | ORAL | Status: DC

## 2013-06-28 MED ORDER — TRIAMCINOLONE ACETONIDE 0.1 % EX LOTN: TOPICAL_LOTION | Freq: Once | CUTANEOUS | Status: DC

## 2013-06-28 MED ORDER — NITROFURANTOIN MONOHYD MACRO 100 MG PO CAPS
100.0000 mg | ORAL_CAPSULE | Freq: Once | ORAL | Status: AC
  Administered 2013-06-29: 100 mg via ORAL
  Filled 2013-06-28: qty 1
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
## 2013-06-28 NOTE — MAU Provider Note (Signed)
Chief Complaint:  No chief complaint on file.  First Provider Initiated Contact with Patient 06/28/13 2324     HPI: Brittany [name] is a 23 y.o. [phone] at 294w4d who presents to maternity admissions reporting possible insect bites and decreased fetal movement. Noticed three pruritic bumps on her lower legs and left big toe after being outside in sandals yesterday. Initially seemed like to see a bites, but are now larger and more pruritic than a few bites that she's had in the past. Also reports that the lesion on her left lower leg formed a small blister that popped this morning and the lesion on her big toe has caused the entire toe to swell and redden. Patient states she has applied over-the-counter hydrocortisone cream and took 2 Benadryl last night with no relief of itching. Patient is extremely distressed by the pruritus.  While performing ROS patient reported right sided pain that she states this has been present since before pregnancy. Describes pain as constant and moderate. States she was told by her obstetrician that the pain was pregnancy related.  Denies contractions, leakage of fluid or vaginal bleeding.    Pregnancy Course:   Past Medical History: Past Medical History  Diagnosis Date  . Asthma   . NVD (normal vaginal delivery) 08/30/2010  . Gestational diabetes   . Hx MRSA infection 2005  . Trichimoniasis   . Chlamydia   . Gonorrhea     Past obstetric history: OB History  Gravida Para Term Preterm AB SAB TAB Ectopic Multiple Living  4 3 3       3     # Outcome Date GA Lbr Len/2nd Weight Sex Delivery Anes PTL Lv  4 CUR           3 TRM 12/07/11 6048w4d / 00:04 3.104 kg (6 lb 13.5 oz) F SVD None  Y  2 TRM 08/30/10 4613w5d 03:25 / 12:30 3.09 kg (6 lb 13 oz) M SVD None  Y     Comments: diabetic- oral meds  1 TRM 06/16/07    M SVD   Y      Past Surgical History: Past Surgical History  Procedure Laterality Date  . No past surgeries    . Multiple tooth extractions        Family History: Family History  Problem Relation Age of Onset  . Anesthesia problems Neg Hx   . Other Neg Hx   . Diabetes Maternal Grandmother   . Hypertension Father   . Heart disease Father   . Lung disease Father     Social History: History  Substance Use Topics  . Smoking status: Former Smoker -- 0.25 packs/day for 5 years    Types: Cigarettes    Quit date: 12/27/2012  . Smokeless tobacco: Never Used  . Alcohol Use: No    Allergies:  Allergies  Allergen Reactions  . Iodine Other (See Comments)    blistering  . Sulfonamide Derivatives Other (See Comments)    blistering    Meds:  No prescriptions prior to admission    ROS: Pertinent findings in history of present illness.  Physical Exam  Blood pressure 135/64, pulse 82, temperature 97.4 F (36.3 C), temperature source Oral, resp. rate 18, height 5\' 4"  (1.626 m), weight 86.694 kg (191 lb 2 oz), last menstrual period 12/03/2012. GENERAL: Well-developed, well-nourished female in no acute distress.  HEENT: normocephalic HEART: normal rate RESP: normal effort ABDOMEN: Soft, non-tender, gravid appropriate for gestational age. No CVA tenderness. EXTREMITIES: 32 cm  raised, erythematous masses one each on the lower leg slightly above the ankle. 1 on the inner, under aspect of the great toe. The lesion on the right ankle has what appears to be a 1 cm popped vesical oozing a scant amount of clear exudate. The other 2 masses are without vesicles, but have a central spot. There is mild edema and erythema of the entire inner aspect of the left great toe that limits her range of motion. Skin is warm and dry. Appearance not consistent with cellulitis. NEURO: alert and oriented SPECULUM EXAM: Deferred    FHT:  Baseline 120 , moderate variability, accelerations present, no decelerations Contractions: none   Labs: Results for orders placed during the hospital encounter of 06/28/13 (from the past 24 hour(s))  URINALYSIS,  ROUTINE W REFLEX MICROSCOPIC     Status: Abnormal   Collection Time    06/28/13 10:08 PM      Result Value Ref Range   Color, Urine YELLOW  YELLOW   APPearance CLEAR  CLEAR   Specific Gravity, Urine >1.030 (*) 1.005 - 1.030   pH 6.0  5.0 - 8.0   Glucose, UA NEGATIVE  NEGATIVE mg/dL   Hgb urine dipstick NEGATIVE  NEGATIVE   Bilirubin Urine NEGATIVE  NEGATIVE   Ketones, ur NEGATIVE  NEGATIVE mg/dL   Protein, ur NEGATIVE  NEGATIVE mg/dL   Urobilinogen, UA 0.2  0.0 - 1.0 mg/dL   Nitrite POSITIVE (*) NEGATIVE   Leukocytes, UA NEGATIVE  NEGATIVE  URINE MICROSCOPIC-ADD ON     Status: Abnormal   Collection Time    06/28/13 10:08 PM      Result Value Ref Range   Squamous Epithelial / LPF FEW (*) RARE   WBC, UA 11-20  <3 WBC/hpf   Bacteria, UA MANY (*) RARE   Urine-Other MUCOUS PRESENT      Imaging:  No results found.  MAU Course: Per consult with Dr. Tenny Crawoss triamcinolone and hydroxyzine ordered for insect bites. Transabdominal and given in maternity admissions. First dose of Macrobid given in MAU for UTI.  Assessment: 1. Decreased fetal movement in pregnancy in third trimester, antepartum   2. Insect bite   3. UTI (urinary tract infection) in pregnancy in third trimester     Plan: Discharge home in stable condition per consult with Dr. Tenny Crawoss. Preterm labor precautions and fetal kick counts. Patient instructed to followup at [place] if she experiences any skin streaking or eschar at the site of a presumed insect bites.     Follow-up Information   [facility] (As scheduled )    Contact information:   [address] [place] [id number] [phone]      [facility]. (As needed, If symptoms worsen)        Medication List         albuterol 108 (90 BASE) MCG/ACT inhaler  Commonly known as:  PROVENTIL HFA;VENTOLIN HFA  Inhale 2 puffs into the lungs every 4 (four) hours as needed  for wheezing or shortness of breath.     hydrOXYzine 25 MG capsule  Commonly known as:  VISTARIL  Take 1-2 capsules (25-50 mg total) by mouth every 6 (six) hours as needed for itching.     nitrofurantoin (macrocrystal-monohydrate) 100 MG capsule  Commonly known as:  MACROBID  Take 1 capsule (100 mg total) by mouth 2 (two) times daily.        Dorathy KinsmanVirginia Mattalynn Crandle,  CNM 06/29/2013 5:55 AM

## 2013-06-29 DIAGNOSIS — O368190 Decreased fetal movements, unspecified trimester, not applicable or unspecified: Secondary | ICD-10-CM

## 2013-06-29 MED ORDER — TRIAMCINOLONE ACETONIDE 0.1 % EX OINT
TOPICAL_OINTMENT | Freq: Once | CUTANEOUS | Status: DC
  Filled 2013-06-29: qty 15

## 2013-06-29 MED ORDER — HYDROXYZINE PAMOATE 25 MG PO CAPS: 25.0000 mg | ORAL_CAPSULE | Freq: Four times a day (QID) | ORAL | Status: DC | PRN

## 2013-06-29 MED ORDER — NITROFURANTOIN MONOHYD MACRO 100 MG PO CAPS: 100.0000 mg | ORAL_CAPSULE | Freq: Two times a day (BID) | ORAL | Status: DC

## 2013-06-29 MED ORDER — TRIAMCINOLONE ACETONIDE 0.1 % EX OINT
TOPICAL_OINTMENT | Freq: Three times a day (TID) | CUTANEOUS | Status: DC
  Administered 2013-06-29: 1 via TOPICAL
  Filled 2013-06-29: qty 15

## 2013-06-29 NOTE — Discharge Instructions (Signed)
Fetal Movement Counts °Patient Name: __________________________________________________ Patient Due Date: ____________________ °Performing a fetal movement count is highly recommended in high-risk pregnancies, but it is good for every pregnant woman to do. Your caregiver may ask you to start counting fetal movements at 28 weeks of the pregnancy. Fetal movements often increase: °· After eating a full meal. °· After physical activity. °· After eating or drinking something sweet or cold. °· At rest. °Pay attention to when you feel the baby is most active. This will help you notice a pattern of your baby's sleep and wake cycles and what factors contribute to an increase in fetal movement. It is important to perform a fetal movement count at the same time each day when your baby is normally most active.  °HOW TO COUNT FETAL MOVEMENTS °1. Find a quiet and comfortable area to sit or lie down on your left side. Lying on your left side provides the best blood and oxygen circulation to your baby. °2. Write down the day and time on a sheet of paper or in a journal. °3. Start counting kicks, flutters, swishes, rolls, or jabs in a 2 hour period. You should feel at least 10 movements within 2 hours. °4. If you do not feel 10 movements in 2 hours, wait 2 3 hours and count again. Look for a change in the pattern or not enough counts in 2 hours. °SEEK MEDICAL CARE IF: °· You feel less than 10 counts in 2 hours, tried twice. °· There is no movement in over an hour. °· The pattern is changing or taking longer each day to reach 10 counts in 2 hours. °· You feel the baby is not moving as he or she usually does. °Date: ____________ Movements: ____________ Start time: ____________ Finish time: ____________  °Date: ____________ Movements: ____________ Start time: ____________ Finish time: ____________ °Date: ____________ Movements: ____________ Start time: ____________ Finish time: ____________ °Date: ____________ Movements: ____________  Start time: ____________ Finish time: ____________ °Date: ____________ Movements: ____________ Start time: ____________ Finish time: ____________ °Date: ____________ Movements: ____________ Start time: ____________ Finish time: ____________ °Date: ____________ Movements: ____________ Start time: ____________ Finish time: ____________ °Date: ____________ Movements: ____________ Start time: ____________ Finish time: ____________  °Date: ____________ Movements: ____________ Start time: ____________ Finish time: ____________ °Date: ____________ Movements: ____________ Start time: ____________ Finish time: ____________ °Date: ____________ Movements: ____________ Start time: ____________ Finish time: ____________ °Date: ____________ Movements: ____________ Start time: ____________ Finish time: ____________ °Date: ____________ Movements: ____________ Start time: ____________ Finish time: ____________ °Date: ____________ Movements: ____________ Start time: ____________ Finish time: ____________ °Date: ____________ Movements: ____________ Start time: ____________ Finish time: ____________  °Date: ____________ Movements: ____________ Start time: ____________ Finish time: ____________ °Date: ____________ Movements: ____________ Start time: ____________ Finish time: ____________ °Date: ____________ Movements: ____________ Start time: ____________ Finish time: ____________ °Date: ____________ Movements: ____________ Start time: ____________ Finish time: ____________ °Date: ____________ Movements: ____________ Start time: ____________ Finish time: ____________ °Date: ____________ Movements: ____________ Start time: ____________ Finish time: ____________ °Date: ____________ Movements: ____________ Start time: ____________ Finish time: ____________  °Date: ____________ Movements: ____________ Start time: ____________ Finish time: ____________ °Date: ____________ Movements: ____________ Start time: ____________ Finish time:  ____________ °Date: ____________ Movements: ____________ Start time: ____________ Finish time: ____________ °Date: ____________ Movements: ____________ Start time: ____________ Finish time: ____________ °Date: ____________ Movements: ____________ Start time: ____________ Finish time: ____________ °Date: ____________ Movements: ____________ Start time: ____________ Finish time: ____________ °Date: ____________ Movements: ____________ Start time: ____________ Finish time: ____________  °Date: ____________ Movements: ____________ Start time: ____________ Finish   time: ____________ Date: ____________ Movements: ____________ Start time: ____________ Brittany MartinFinish time: ____________ Date: ____________ Movements: ____________ Start time: ____________ Brittany MartinFinish time: ____________ Date: ____________ Movements: ____________ Start time: ____________ Brittany MartinFinish time: ____________ Date: ____________ Movements: ____________ Start time: ____________ Brittany MartinFinish time: ____________ Date: ____________ Movements: ____________ Start time: ____________ Brittany MartinFinish time: ____________ Date: ____________ Movements: ____________ Start time: ____________ Brittany MartinFinish time: ____________  Date: ____________ Movements: ____________ Start time: ____________ Brittany MartinFinish time: ____________ Date: ____________ Movements: ____________ Start time: ____________ Brittany MartinFinish time: ____________ Date: ____________ Movements: ____________ Start time: ____________ Brittany MartinFinish time: ____________ Date: ____________ Movements: ____________ Start time: ____________ Brittany MartinFinish time: ____________ Date: ____________ Movements: ____________ Start time: ____________ Brittany MartinFinish time: ____________ Date: ____________ Movements: ____________ Start time: ____________ Brittany MartinFinish time: ____________ Date: ____________ Movements: ____________ Start time: ____________ Brittany MartinFinish time: ____________  Date: ____________ Movements: ____________ Start time: ____________ Brittany MartinFinish time: ____________ Date: ____________ Movements:  ____________ Start time: ____________ Brittany MartinFinish time: ____________ Date: ____________ Movements: ____________ Start time: ____________ Brittany MartinFinish time: ____________ Date: ____________ Movements: ____________ Start time: ____________ Brittany MartinFinish time: ____________ Date: ____________ Movements: ____________ Start time: ____________ Brittany MartinFinish time: ____________ Date: ____________ Movements: ____________ Start time: ____________ Brittany MartinFinish time: ____________ Date: ____________ Movements: ____________ Start time: ____________ Brittany MartinFinish time: ____________  Date: ____________ Movements: ____________ Start time: ____________ Brittany MartinFinish time: ____________ Date: ____________ Movements: ____________ Start time: ____________ Brittany MartinFinish time: ____________ Date: ____________ Movements: ____________ Start time: ____________ Brittany MartinFinish time: ____________ Date: ____________ Movements: ____________ Start time: ____________ Brittany MartinFinish time: ____________ Date: ____________ Movements: ____________ Start time: ____________ Brittany MartinFinish time: ____________ Date: ____________ Movements: ____________ Start time: ____________ Brittany MartinFinish time: ____________ Document Released: 03/03/2006 Document Revised: 01/19/2012 Document Reviewed: 11/29/2011 [employer] Patient Information 2014 [employer]  Insect Bite Mosquitoes, flies, fleas, bedbugs, and many other insects can bite. Insect bites are different from insect stings. A sting is when venom is injected into the skin. Some insect bites can transmit infectious diseases. SYMPTOMS  Insect bites usually turn red, swell, and itch for 2 to 4 days. They often go away on their own. TREATMENT  Your caregiver may prescribe antibiotic medicines if a bacterial infection develops in the bite. HOME CARE INSTRUCTIONS  Do not scratch the bite area.  Keep the bite area clean and dry. Wash the bite area thoroughly with soap and water.  Put ice or cool compresses on the bite area.  Put ice in a plastic bag.  Place a towel between  your skin and the bag.  Leave the ice on for 20 minutes, 4 times a day for the first 2 to 3 days, or as directed.  You may apply a baking soda paste, cortisone cream, or calamine lotion to the bite area as directed by your caregiver. This can help reduce itching and swelling.  Only take over-the-counter or prescription medicines as directed by your caregiver.  If you are given antibiotics, take them as directed. Finish them even if you start to feel better. You may need a tetanus shot if:  You cannot remember when you had your last tetanus shot.  You have never had a tetanus shot.  The injury broke your skin. If you get a tetanus shot, your arm may swell, get red, and feel warm to the touch. This is common and not a problem. If you need a tetanus shot and you choose not to have one, there is a rare chance of getting tetanus. Sickness from tetanus can be serious. SEEK IMMEDIATE MEDICAL CARE IF:   You have increased pain, redness, or swelling in the bite area.  You see a red  line on the skin coming from the bite.  You have a fever.  You have joint pain.  You have a headache or neck pain.  You have unusual weakness.  You have a rash.  You have chest pain or shortness of breath.  You have abdominal pain, nausea, or vomiting.  You feel unusually tired or sleepy. MAKE SURE YOU:   Understand these instructions.  Will watch your condition.  Will get help right away if you are not doing well or get worse. Document Released: 03/11/2004 Document Revised: 04/26/2011 Document Reviewed: 09/02/2010 [facility] Patient Information 2014 [place] [place].

## 2013-07-02 ENCOUNTER — Encounter: Payer: Self-pay | Admitting: *Deleted

## 2013-07-07 ENCOUNTER — Encounter (HOSPITAL_COMMUNITY): Payer: Self-pay

## 2013-07-07 ENCOUNTER — Inpatient Hospital Stay (HOSPITAL_COMMUNITY)
Admission: AD | Admit: 2013-07-07 | Discharge: 2013-07-07 | Disposition: A | Discharge status: Home / Self Care | Payer: Medicaid Other | Source: Ambulatory Visit | Attending: Obstetrics and Gynecology | Admitting: Obstetrics and Gynecology

## 2013-07-07 DIAGNOSIS — Z8614 Personal history of Methicillin resistant Staphylococcus aureus infection: Secondary | ICD-10-CM | POA: Insufficient documentation

## 2013-07-07 DIAGNOSIS — O2390 Unspecified genitourinary tract infection in pregnancy, unspecified trimester: Secondary | ICD-10-CM | POA: Insufficient documentation

## 2013-07-07 DIAGNOSIS — A499 Bacterial infection, unspecified: Secondary | ICD-10-CM | POA: Insufficient documentation

## 2013-07-07 DIAGNOSIS — O26859 Spotting complicating pregnancy, unspecified trimester: Secondary | ICD-10-CM | POA: Insufficient documentation

## 2013-07-07 DIAGNOSIS — Z87891 Personal history of nicotine dependence: Secondary | ICD-10-CM | POA: Insufficient documentation

## 2013-07-07 DIAGNOSIS — B9689 Other specified bacterial agents as the cause of diseases classified elsewhere: Secondary | ICD-10-CM

## 2013-07-07 DIAGNOSIS — O26899 Other specified pregnancy related conditions, unspecified trimester: Secondary | ICD-10-CM

## 2013-07-07 DIAGNOSIS — R109 Unspecified abdominal pain: Secondary | ICD-10-CM | POA: Insufficient documentation

## 2013-07-07 DIAGNOSIS — N760 Acute vaginitis: Secondary | ICD-10-CM | POA: Insufficient documentation

## 2013-07-07 LAB — URINALYSIS, ROUTINE W REFLEX MICROSCOPIC
BILIRUBIN URINE: NEGATIVE
GLUCOSE, UA: NEGATIVE mg/dL
HGB URINE DIPSTICK: NEGATIVE
KETONES UR: NEGATIVE mg/dL
Nitrite: NEGATIVE
PROTEIN: NEGATIVE mg/dL
Specific Gravity, Urine: 1.015 (ref 1.005–1.030)
UROBILINOGEN UA: 2 mg/dL — AB (ref 0.0–1.0)
pH: 7.5 (ref 5.0–8.0)

## 2013-07-07 LAB — URINE MICROSCOPIC-ADD ON

## 2013-07-07 LAB — WET PREP, GENITAL
TRICH WET PREP: NONE SEEN
YEAST WET PREP: NONE SEEN

## 2013-07-07 MED ORDER — CYCLOBENZAPRINE HCL 5 MG PO TABS: 5.0000 mg | ORAL_TABLET | Freq: Three times a day (TID) | ORAL | Status: DC | PRN

## 2013-07-07 MED ORDER — METRONIDAZOLE 500 MG PO TABS: 500.0000 mg | ORAL_TABLET | Freq: Two times a day (BID) | ORAL | Status: DC

## 2013-07-07 NOTE — MAU Provider Note (Signed)
History     CSN: [id number]  Arrival date and time: 07/07/13 2208   First Provider Initiated Contact with Patient 07/07/13 2252      Chief Complaint  Patient presents with  . Abdominal Pain  . Vaginal Bleeding   HPI Brittany [name] is a 23 y.o. [phone] at [redacted]w[redacted]d who presents to MAU today with complaint of lower abdominal pain and spotting x a few hours. She states that after she noted the spotting she also had increased lower abdominal pressure. She states that pain is worse with ambulation. She also noted spotting with wiping earlier today. She states that she also saw a spot in her underwear since then, but has not worn a pad. She denies bright red bleeding. She reports occasional mild contractions, unchanged from previously. She denies UTI symptoms, fever or LOF. She has noted an increase in mucus discharge. She reports good fetal movement.    OB History   Grav Para Term Preterm Abortions TAB SAB Ect Mult Living   4 3 3       3       Past Medical History  Diagnosis Date  . Asthma   . NVD (normal vaginal delivery) 08/30/2010  . Gestational diabetes   . Hx MRSA infection 2005  . Trichimoniasis   . Chlamydia   . Gonorrhea     Past Surgical History  Procedure Laterality Date  . No past surgeries    . Multiple tooth extractions      Family History  Problem Relation Age of Onset  . Anesthesia problems Neg Hx   . Other Neg Hx   . Diabetes Maternal Grandmother   . Hypertension Father   . Heart disease Father   . Lung disease Father     History  Substance Use Topics  . Smoking status: Former Smoker -- 0.25 packs/day for 5 years    Types: Cigarettes    Quit date: 12/27/2012  . Smokeless tobacco: Never Used  . Alcohol Use: No    Allergies:  Allergies  Allergen Reactions  . Iodine Other (See Comments)    blistering  . Sulfonamide Derivatives Other (See Comments)    blistering    No prescriptions prior to admission    Review of Systems   Constitutional: Negative for fever and malaise/fatigue.  Gastrointestinal: Positive for nausea and abdominal pain. Negative for vomiting, diarrhea and constipation.  Genitourinary: Negative for dysuria, urgency and frequency.       + vaginal bleeding, discharge Neg - LOF   Physical Exam   Blood pressure 117/79, pulse 73, temperature 97.2 F (36.2 C), temperature source Oral, resp. rate 18, height 5\' 5"  (1.651 m), weight 192 lb 3.2 oz (87.181 kg), last menstrual period 12/03/2012.  Physical Exam  Constitutional: She is oriented to person, place, and time. She appears well-developed and well-nourished. No distress.  HENT:  Head: Normocephalic and atraumatic.  Cardiovascular: Normal rate.   Respiratory: Effort normal.  GI: Soft. Bowel sounds are normal. She exhibits no distension and no mass. There is tenderness (moderate tenderness to palpation of the lower abdomen bilaterally). There is no rebound and no guarding.  Genitourinary: Uterus is enlarged (appropriate for GA). Cervix exhibits no motion tenderness, no discharge and no friability. No bleeding around the vagina. Vaginal discharge (moderate amount of thick, white, malodorous discharge noted) found.  Neurological: She is alert and oriented to person, place, and time.  Skin: Skin is warm and dry. No erythema.  Psychiatric: She has a normal mood  and affect.  Dilation: Fingertip Effacement (%): Thick Cervical Position: Posterior Exam by:: Joseph BerkshireJulie Ethier PA-C  Results for orders placed during the hospital encounter of 07/07/13 (from the past 24 hour(s))  URINALYSIS, ROUTINE W REFLEX MICROSCOPIC     Status: Abnormal   Collection Time    07/07/13 10:25 PM      Result Value Ref Range   Color, Urine YELLOW  YELLOW   APPearance CLEAR  CLEAR   Specific Gravity, Urine 1.015  1.005 - 1.030   pH 7.5  5.0 - 8.0   Glucose, UA NEGATIVE  NEGATIVE mg/dL   Hgb urine dipstick NEGATIVE  NEGATIVE   Bilirubin Urine NEGATIVE  NEGATIVE   Ketones,  ur NEGATIVE  NEGATIVE mg/dL   Protein, ur NEGATIVE  NEGATIVE mg/dL   Urobilinogen, UA 2.0 (*) 0.0 - 1.0 mg/dL   Nitrite NEGATIVE  NEGATIVE   Leukocytes, UA TRACE (*) NEGATIVE  URINE MICROSCOPIC-ADD ON     Status: Abnormal   Collection Time    07/07/13 10:25 PM      Result Value Ref Range   Squamous Epithelial / LPF FEW (*) RARE   WBC, UA 0-2  <3 WBC/hpf   RBC / HPF 0-2  <3 RBC/hpf   Bacteria, UA FEW (*) RARE   Urine-Other MUCOUS PRESENT    WET PREP, GENITAL     Status: Abnormal   Collection Time    07/07/13 11:05 PM      Result Value Ref Range   Yeast Wet Prep HPF POC NONE SEEN  NONE SEEN   Trich, Wet Prep NONE SEEN  NONE SEEN   Clue Cells Wet Prep HPF POC FEW (*) NONE SEEN   WBC, Wet Prep HPF POC MODERATE (*) NONE SEEN   Fetal Monitoring: Baseline: 135 bpm, moderate variability, + accelerations, no decelerations Contractions: mild UI  MAU Course  Procedures None  MDM UA, wet prep and GC/Chlamydia today Discussed with Dr. Henderson CloudHorvath. Rx for Flagyl. Follow-up as scheduled Just prior to discharge patient states that Tylenol does "nothing for my pain"  Rx given for Flexeril #12 and [facility]  A: SIUP at 7732w6d Bacterial vaginosis  P: Discharge home Rx for Flagyl sent to patient's pharmacy Bleeding precautions and preterm labor precautions discussed Patient advised to follow-up in office as scheduled for routine prenatal care Patient may return to MAU as needed or if her condition were to change or worsen   Freddi StarrJulie N Ethier, PA-C  07/08/2013, 12:58 AM

## 2013-07-07 NOTE — MAU Note (Signed)
Having some abd pain today. Some pink spotting today. Having a lot of pelvic pressure whether i'm walking or sitting. Mucousy d/c

## 2013-07-07 NOTE — Discharge Instructions (Signed)
Abdominal Pain During Pregnancy Belly (abdominal) pain is common during pregnancy. Most of the time, it is not a serious problem. Other times, it can be a sign that something is wrong with the pregnancy. Always tell your doctor if you have belly pain. HOME CARE Monitor your belly pain for any changes. The following actions may help you feel better:  Do not have sex (intercourse) or put anything in your vagina until you feel better.  Rest until your pain stops.  Drink clear fluids if you feel sick to your stomach (nauseous). Do not eat solid food until you feel better.  Only take medicine as told by your doctor.  Keep all doctor visits as told. GET HELP RIGHT AWAY IF:   You are bleeding, leaking fluid, or pieces of tissue come out of your vagina.  You have more pain or cramping.  You keep throwing up (vomiting).  You have pain when you pee (urinate) or have blood in your pee.  You have a fever.  You do not feel your baby moving as much.  You feel very weak or feel like passing out.  You have trouble breathing, with or without belly pain.  You have a very bad headache and belly pain.  You have fluid leaking from your vagina and belly pain.  You keep having watery poop (diarrhea).  Your belly pain does not go away after resting, or the pain gets worse. MAKE SURE YOU:  Understand these instructions.Bacterial Vaginosis Bacterial vaginosis is an infection of the vagina. It happens when too many of certain germs (bacteria) grow in the vagina. HOME CARE Take your medicine as told by your doctor. Finish your medicine even if you start to feel better. Do not have sex until you finish your medicine and are better. Tell your sex partner that you have an infection. They should see their doctor for treatment. Practice safe sex. Use condoms. Have only one sex partner. GET HELP IF: You are not getting better after 3 days of treatment. You have more grey fluid (discharge) coming from  your vagina than before. You have more pain than before. You have a fever. MAKE SURE YOU:  Understand these instructions. Will watch your condition. Will get help right away if you are not doing well or get worse. Document Released: 11/11/2007 Document Revised: 11/22/2012 Document Reviewed: 09/13/2012 [facility] Patient Information 2014 [place], [place].    Will watch your condition.  Will get help right away if you are not doing well or get worse. Document Released: 01/20/2009 Document Revised: 10/04/2012 Document Reviewed: 08/31/2012 [facility] Patient Information 2014 [place], [place].

## 2013-07-09 LAB — GC/CHLAMYDIA PROBE AMP
CT Probe RNA: NEGATIVE
GC Probe RNA: NEGATIVE

## 2013-08-06 LAB — OB RESULTS CONSOLE GBS: STREP GROUP B AG: NEGATIVE

## 2013-08-20 ENCOUNTER — Encounter (HOSPITAL_COMMUNITY): Payer: Self-pay

## 2013-08-20 ENCOUNTER — Inpatient Hospital Stay (HOSPITAL_COMMUNITY): Payer: Medicaid Other

## 2013-08-20 ENCOUNTER — Inpatient Hospital Stay (HOSPITAL_COMMUNITY)
Admission: AD | Admit: 2013-08-20 | Discharge: 2013-08-20 | Disposition: A | Discharge status: Home / Self Care | Payer: Medicaid Other | Source: Ambulatory Visit | Attending: Obstetrics & Gynecology | Admitting: Obstetrics & Gynecology

## 2013-08-20 DIAGNOSIS — Z36 Encounter for antenatal screening of mother: Secondary | ICD-10-CM

## 2013-08-20 DIAGNOSIS — O368190 Decreased fetal movements, unspecified trimester, not applicable or unspecified: Secondary | ICD-10-CM | POA: Diagnosis not present

## 2013-08-20 DIAGNOSIS — Z87891 Personal history of nicotine dependence: Secondary | ICD-10-CM | POA: Diagnosis not present

## 2013-08-20 DIAGNOSIS — Z8614 Personal history of Methicillin resistant Staphylococcus aureus infection: Secondary | ICD-10-CM | POA: Insufficient documentation

## 2013-08-20 DIAGNOSIS — O479 False labor, unspecified: Secondary | ICD-10-CM | POA: Diagnosis not present

## 2013-08-20 DIAGNOSIS — Z3689 Encounter for other specified antenatal screening: Secondary | ICD-10-CM

## 2013-08-20 IMAGING — US US FETAL BPP W/O NONSTRESS
1 series · 14 of 23 positions shown · non-contrast
Comparison: none

[Series 1: us fetal bpp w/o nonstress · non-contrast · 23 acquisitions, 14 frames shown]
[im 1/23]
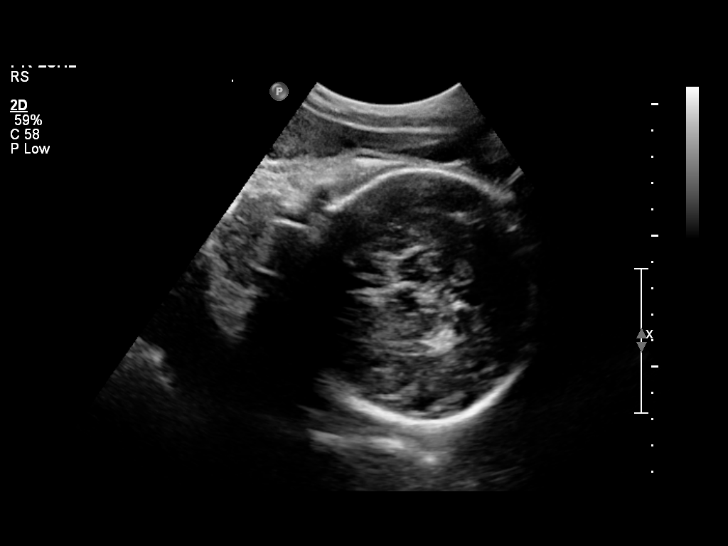
[im 3/23]
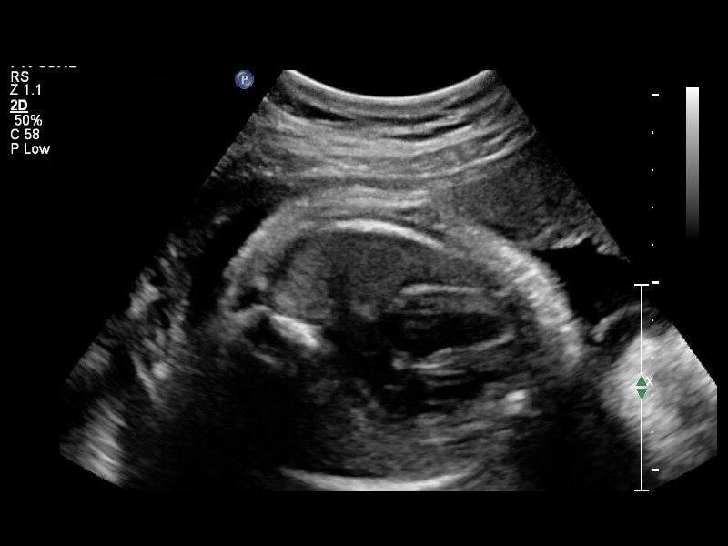
[im 5/23]
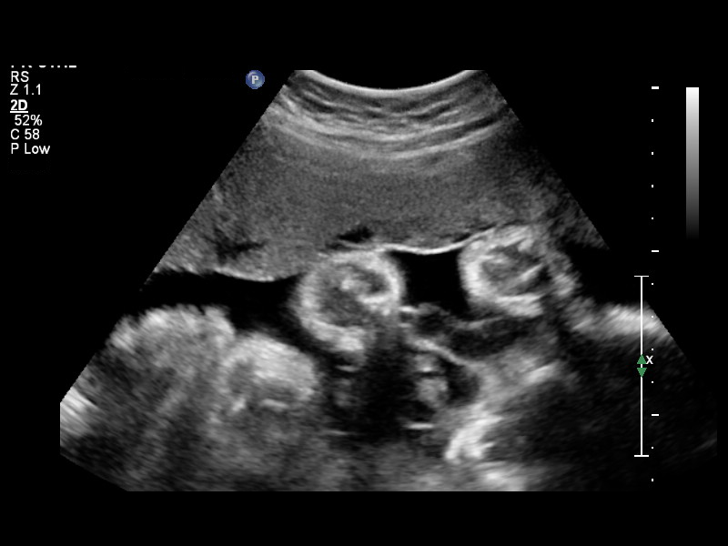
[im 6/23]
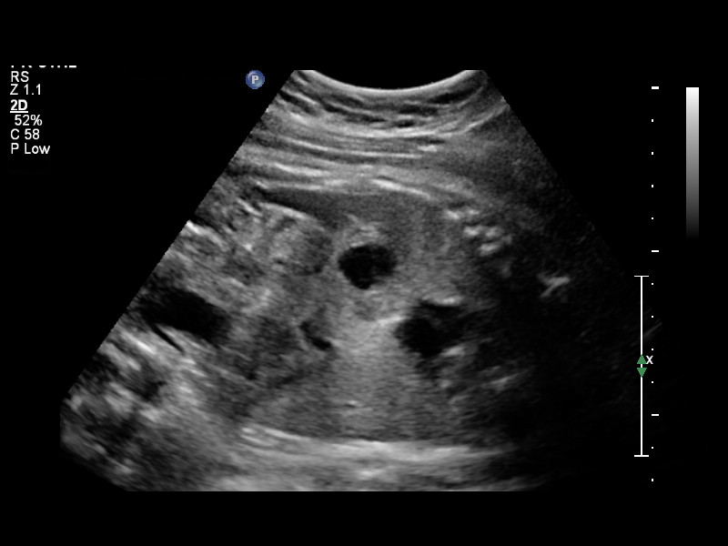
[im 8/23]
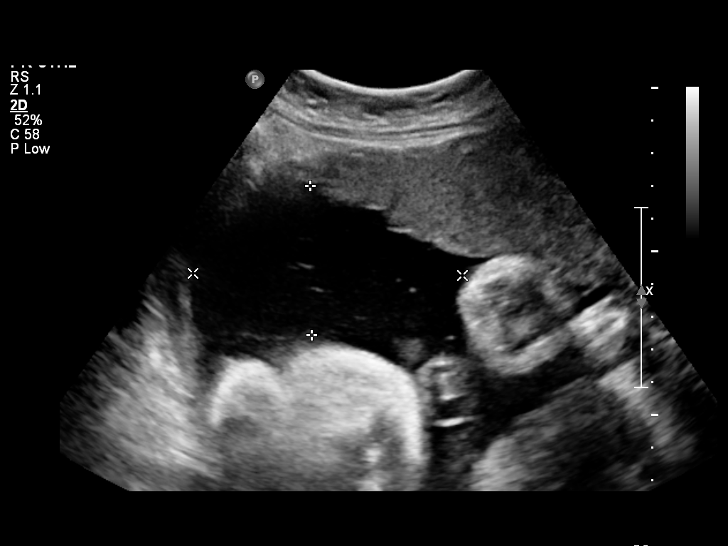
[im 10/23]
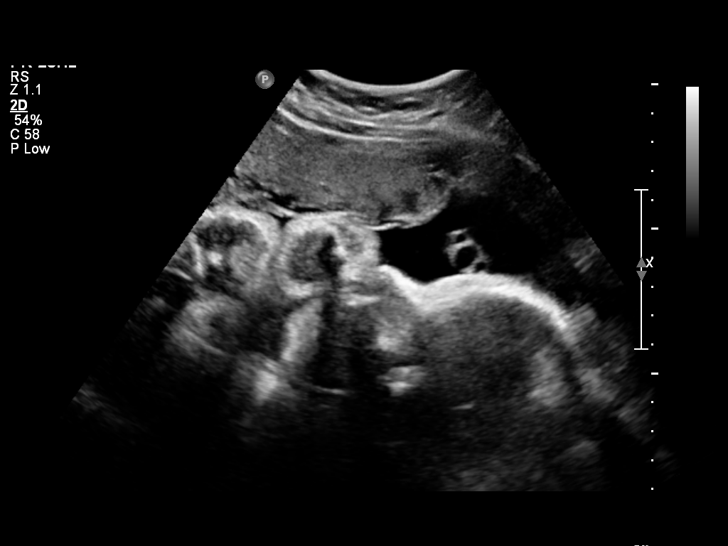
[im 11/23]
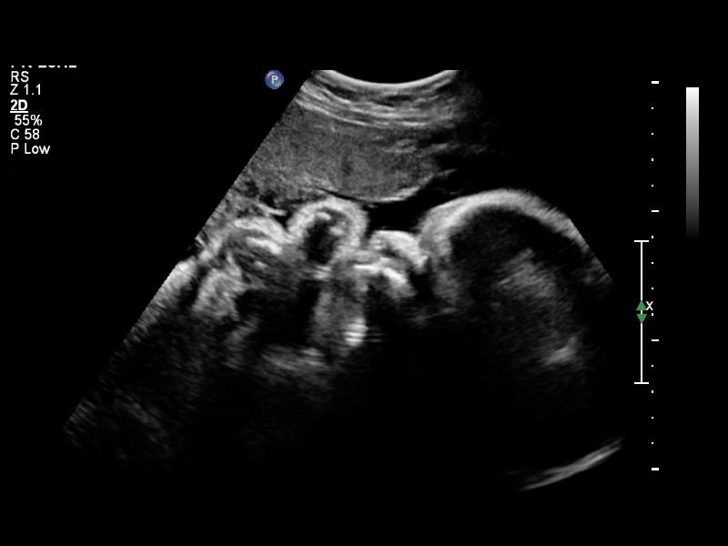
[im 13/23]
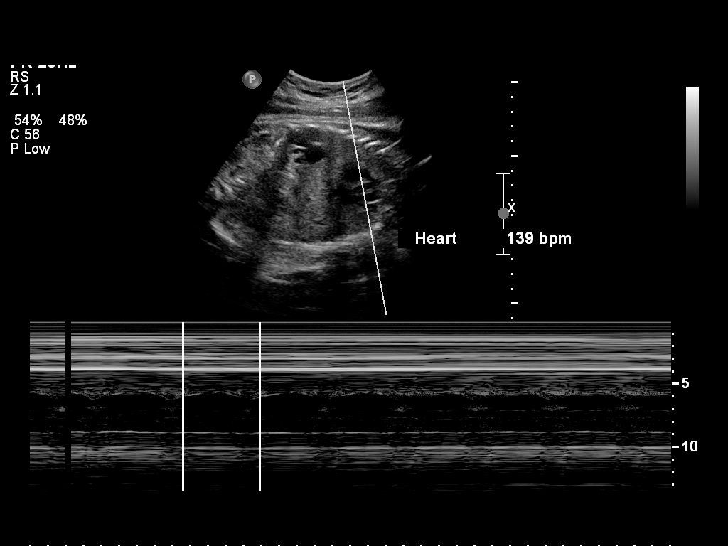
[im 14/23]
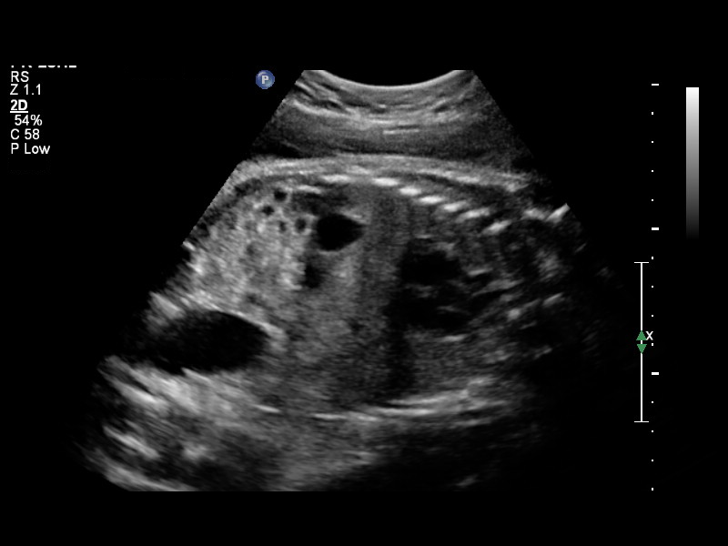
[im 16/23]
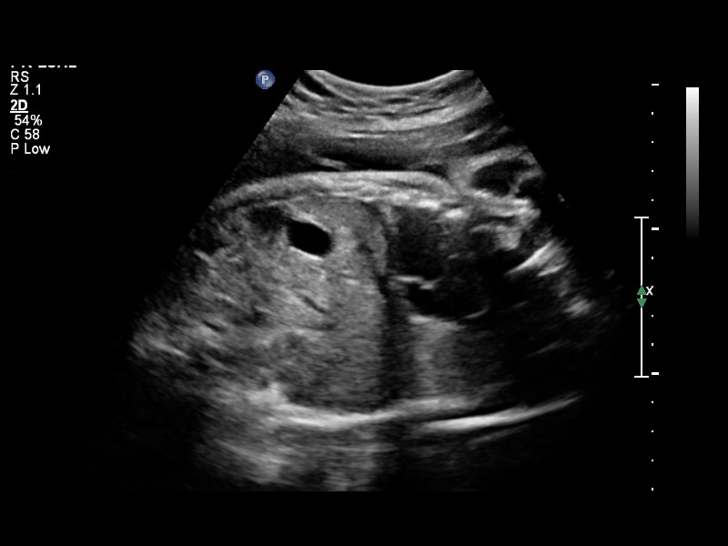
[im 18/23]
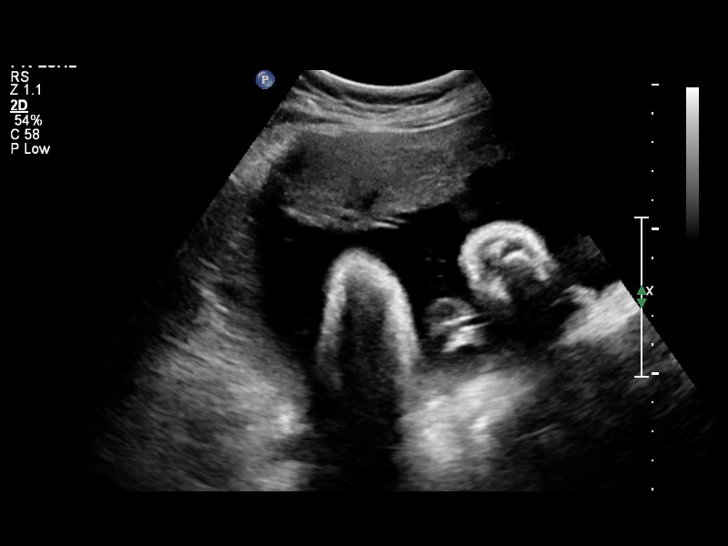
[im 19/23]
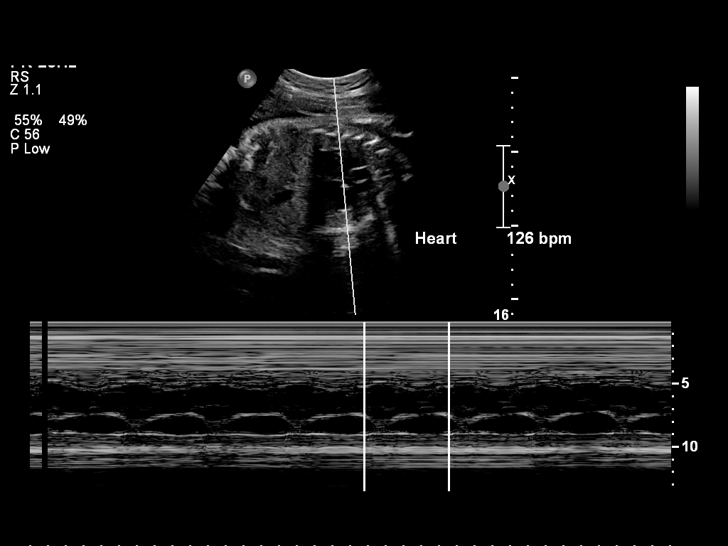
[im 21/23]
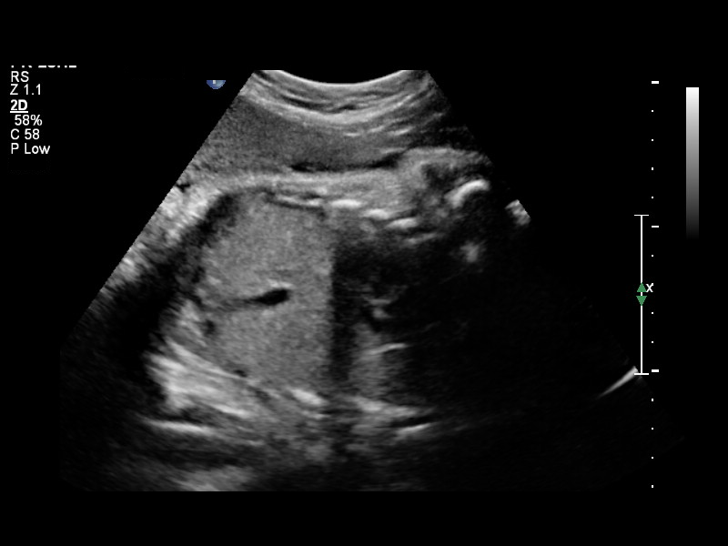
[im 23/23]
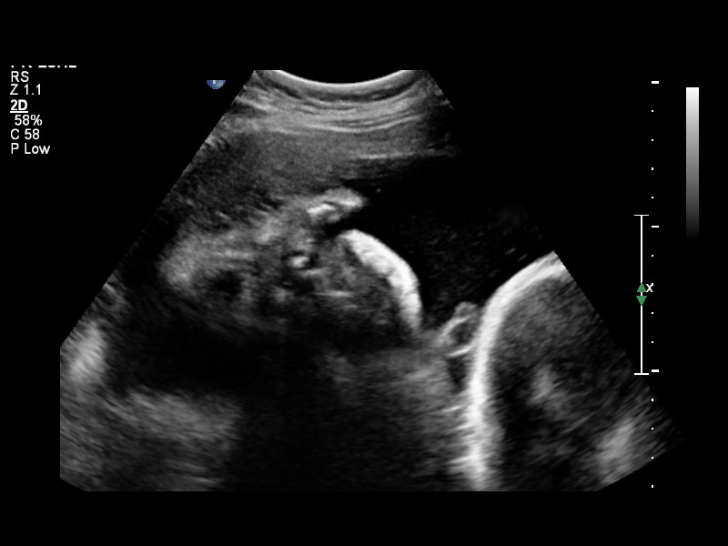

[14 of 23 positions shown; findings below may reference images not displayed]

OBSTETRICS REPORT
                      (Signed Final [DATE] [DATE])

Service(s) Provided

Indications

 Non-reactive NST, FHR decelerations (In office)
 Asthma
 Poor obstetric history: Previous gestational          [XQ]
 diabetes
Fetal Evaluation

 Num Of Fetuses:    1
 Fetal Heart Rate:  142                          bpm
 Cardiac Activity:  Observed
 Presentation:      Cephalic

 Amniotic Fluid
 AFI FV:      Subjectively within normal limits
                                              Larg Pckt:    4.5  cm
Biophysical Evaluation

 Amniotic F.V:   Pocket => 2 cm two         F. Tone:         Observed
                 planes
 F. Movement:    Observed                   Score:           [DATE]
 F. Breathing:   Observed
Gestational Age

 LMP:           37w 1d        Date:  [DATE]                 EDD:   [DATE]
 Best:          37w 1d     Det. By:  LMP  ([DATE])          EDD:   [DATE]
Impression

 Single IUP a 37w 1d
 BPP [DATE]
 Normal amniotic fluid volume
Recommendations

 Please coorelate BPP with fetal strip - if overall tracing is
 reassuring, recommend follow up antepartum testing as
 clinically indicated.

## 2013-08-20 NOTE — MAU Note (Signed)
Pt states was seen at MD office for decreased fm, NST performed, told to come to MAU for ultrasound because heart rate kept dropping. Denies bleeding or lof.

## 2013-08-20 NOTE — Discharge Instructions (Signed)
Fetal Movement Counts °Patient Name: __________________________________________________ Patient Due Date: ____________________ °Performing a fetal movement count is highly recommended in high-risk pregnancies, but it is good for every pregnant woman to do. Your caregiver may ask you to start counting fetal movements at 28 weeks of the pregnancy. Fetal movements often increase: °· After eating a full meal. °· After physical activity. °· After eating or drinking something sweet or cold. °· At rest. °Pay attention to when you feel the baby is most active. This will help you notice a pattern of your baby's sleep and wake cycles and what factors contribute to an increase in fetal movement. It is important to perform a fetal movement count at the same time each day when your baby is normally most active.  °HOW TO COUNT FETAL MOVEMENTS °1. Find a quiet and comfortable area to sit or lie down on your left side. Lying on your left side provides the best blood and oxygen circulation to your baby. °2. Write down the day and time on a sheet of paper or in a journal. °3. Start counting kicks, flutters, swishes, rolls, or jabs in a 2 hour period. You should feel at least 10 movements within 2 hours. °4. If you do not feel 10 movements in 2 hours, wait 2-3 hours and count again. Look for a change in the pattern or not enough counts in 2 hours. °SEEK MEDICAL CARE IF: °· You feel less than 10 counts in 2 hours, tried twice. °· There is no movement in over an hour. °· The pattern is changing or taking longer each day to reach 10 counts in 2 hours. °· You feel the baby is not moving as he or she usually does. °Date: ____________ Movements: ____________ Start time: ____________ Finish time: ____________  °Date: ____________ Movements: ____________ Start time: ____________ Finish time: ____________ °Date: ____________ Movements: ____________ Start time: ____________ Finish time: ____________ °Date: ____________ Movements: ____________  Start time: ____________ Finish time: ____________ °Date: ____________ Movements: ____________ Start time: ____________ Finish time: ____________ °Date: ____________ Movements: ____________ Start time: ____________ Finish time: ____________ °Date: ____________ Movements: ____________ Start time: ____________ Finish time: ____________ °Date: ____________ Movements: ____________ Start time: ____________ Finish time: ____________  °Date: ____________ Movements: ____________ Start time: ____________ Finish time: ____________ °Date: ____________ Movements: ____________ Start time: ____________ Finish time: ____________ °Date: ____________ Movements: ____________ Start time: ____________ Finish time: ____________ °Date: ____________ Movements: ____________ Start time: ____________ Finish time: ____________ °Date: ____________ Movements: ____________ Start time: ____________ Finish time: ____________ °Date: ____________ Movements: ____________ Start time: ____________ Finish time: ____________ °Date: ____________ Movements: ____________ Start time: ____________ Finish time: ____________  °Date: ____________ Movements: ____________ Start time: ____________ Finish time: ____________ °Date: ____________ Movements: ____________ Start time: ____________ Finish time: ____________ °Date: ____________ Movements: ____________ Start time: ____________ Finish time: ____________ °Date: ____________ Movements: ____________ Start time: ____________ Finish time: ____________ °Date: ____________ Movements: ____________ Start time: ____________ Finish time: ____________ °Date: ____________ Movements: ____________ Start time: ____________ Finish time: ____________ °Date: ____________ Movements: ____________ Start time: ____________ Finish time: ____________  °Date: ____________ Movements: ____________ Start time: ____________ Finish time: ____________ °Date: ____________ Movements: ____________ Start time: ____________ Finish time:  ____________ °Date: ____________ Movements: ____________ Start time: ____________ Finish time: ____________ °Date: ____________ Movements: ____________ Start time: ____________ Finish time: ____________ °Date: ____________ Movements: ____________ Start time: ____________ Finish time: ____________ °Date: ____________ Movements: ____________ Start time: ____________ Finish time: ____________ °Date: ____________ Movements: ____________ Start time: ____________ Finish time: ____________  °Date: ____________ Movements: ____________ Start time: ____________ Finish time:   ____________ °Date: ____________ Movements: ____________ Start time: ____________ Finish time: ____________ °Date: ____________ Movements: ____________ Start time: ____________ Finish time: ____________ °Date: ____________ Movements: ____________ Start time: ____________ Finish time: ____________ °Date: ____________ Movements: ____________ Start time: ____________ Finish time: ____________ °Date: ____________ Movements: ____________ Start time: ____________ Finish time: ____________ °Date: ____________ Movements: ____________ Start time: ____________ Finish time: ____________  °Date: ____________ Movements: ____________ Start time: ____________ Finish time: ____________ °Date: ____________ Movements: ____________ Start time: ____________ Finish time: ____________ °Date: ____________ Movements: ____________ Start time: ____________ Finish time: ____________ °Date: ____________ Movements: ____________ Start time: ____________ Finish time: ____________ °Date: ____________ Movements: ____________ Start time: ____________ Finish time: ____________ °Date: ____________ Movements: ____________ Start time: ____________ Finish time: ____________ °Date: ____________ Movements: ____________ Start time: ____________ Finish time: ____________  °Date: ____________ Movements: ____________ Start time: ____________ Finish time: ____________ °Date: ____________ Movements:  ____________ Start time: ____________ Finish time: ____________ °Date: ____________ Movements: ____________ Start time: ____________ Finish time: ____________ °Date: ____________ Movements: ____________ Start time: ____________ Finish time: ____________ °Date: ____________ Movements: ____________ Start time: ____________ Finish time: ____________ °Date: ____________ Movements: ____________ Start time: ____________ Finish time: ____________ °Date: ____________ Movements: ____________ Start time: ____________ Finish time: ____________  °Date: ____________ Movements: ____________ Start time: ____________ Finish time: ____________ °Date: ____________ Movements: ____________ Start time: ____________ Finish time: ____________ °Date: ____________ Movements: ____________ Start time: ____________ Finish time: ____________ °Date: ____________ Movements: ____________ Start time: ____________ Finish time: ____________ °Date: ____________ Movements: ____________ Start time: ____________ Finish time: ____________ °Date: ____________ Movements: ____________ Start time: ____________ Finish time: ____________ °Document Released: 03/03/2006 Document Revised: 01/19/2012 Document Reviewed: 11/29/2011 °[employer]® Patient Information ©2015 [employer]. This information is not intended to replace advice given to you by your health care provider. Make sure you discuss any questions you have with your health care provider. ° °

## 2013-08-20 NOTE — MAU Provider Note (Signed)
History     CSN: [id number]  Arrival date and time: 08/20/13 1700   First Provider Initiated Contact with Patient 08/20/13 1759      Chief Complaint  Patient presents with  . Non-stress Test   HPI [name] is a 23 y.o. [phone] at 599w1d who was sent over from the office for BPP for non-reactive NST and decreased fetal movement since yesterday. The patient denies vaginal bleeding or LOF. She states occasional, irregular contractions. She reports good fetal movement since arrival in MAU.    OB History   Grav Para Term Preterm Abortions TAB SAB Ect Mult Living   4 3 3       3       Past Medical History  Diagnosis Date  . Asthma   . NVD (normal vaginal delivery) 08/30/2010  . Gestational diabetes   . Hx MRSA infection 2005  . Trichimoniasis   . Chlamydia   . Gonorrhea     Past Surgical History  Procedure Laterality Date  . No past surgeries    . Multiple tooth extractions      Family History  Problem Relation Age of Onset  . Anesthesia problems Neg Hx   . Other Neg Hx   . Diabetes Maternal Grandmother   . Hypertension Father   . Heart disease Father   . Lung disease Father     History  Substance Use Topics  . Smoking status: Former Smoker -- 0.25 packs/day for 5 years    Types: Cigarettes    Quit date: 12/27/2012  . Smokeless tobacco: Never Used  . Alcohol Use: No    Allergies:  Allergies  Allergen Reactions  . Iodine Other (See Comments)    blistering  . Sulfonamide Derivatives Other (See Comments)    blistering    Prescriptions prior to admission  Medication Sig Dispense Refill  . albuterol (PROVENTIL HFA;VENTOLIN HFA) 108 (90 BASE) MCG/ACT inhaler Inhale 2 puffs into the lungs every 4 (four) hours as needed for wheezing or shortness of breath.  1 Inhaler  0  . cyclobenzaprine (FLEXERIL) 5 MG tablet Take 1 tablet (5 mg total) by mouth 3 (three) times daily as needed for muscle spasms.  12 tablet  0  . hydrOXYzine (VISTARIL) 25 MG  capsule Take 1-2 capsules (25-50 mg total) by mouth every 6 (six) hours as needed for itching.  30 capsule  1  . metroNIDAZOLE (FLAGYL) 500 MG tablet Take 1 tablet (500 mg total) by mouth 2 (two) times daily.  14 tablet  0  . nitrofurantoin, macrocrystal-monohydrate, (MACROBID) 100 MG capsule Take 1 capsule (100 mg total) by mouth 2 (two) times daily.  14 capsule  0    Review of Systems  Constitutional: Negative for fever and malaise/fatigue.  Gastrointestinal: Negative for nausea, vomiting, abdominal pain, diarrhea and constipation.  Genitourinary: Negative for dysuria, urgency and frequency.       Neg - vaginal bleeding, LOF   Physical Exam   Blood pressure 124/77, pulse 88, temperature 98.4 F (36.9 C), temperature source Oral, resp. rate 16, last menstrual period 12/03/2012, SpO2 100.00%, not currently breastfeeding.  Physical Exam  Constitutional: She is oriented to person, place, and time. She appears well-developed and well-nourished. No distress.  HENT:  Head: Normocephalic and atraumatic.  Cardiovascular: Normal rate.   Respiratory: Effort normal.  GI: Soft. She exhibits no distension and no mass. There is no tenderness. There is no rebound and no guarding.  Neurological: She is alert and oriented  to person, place, and time.  Skin: Skin is warm and dry. No erythema.  Psychiatric: She has a normal mood and affect.   Fetal Monitoring: Baseline: 125 bpm, moderate variability, + accelerations, no decelerations Contractions: occasional, irregular MAU Course  Procedures None  MDM Discussed with Dr. Mora ApplPinn. BPP today.   Assessment and Plan  A: SIUP at 2262w1d Reactive NST BPP  P: Discharge home Kick counts discussed Patient advised to follow-up with Dr. Mora ApplPinn as scheduled for routine prenatal care Patient may return to MAU as needed or if her condition were to change or worsen   Freddi StarrJulie N Ethier, PA-C  08/20/2013, 7:03 PM

## 2013-08-21 NOTE — MAU Provider Note (Signed)
Reviewed case with PA BPP 8/8  Nicholson Starace STACIA

## 2013-08-23 ENCOUNTER — Inpatient Hospital Stay (HOSPITAL_COMMUNITY)
Admission: AD | Admit: 2013-08-23 | Discharge: 2013-08-24 | Disposition: A | Discharge status: Home / Self Care | Payer: Medicaid Other | Source: Ambulatory Visit | Attending: Obstetrics and Gynecology | Admitting: Obstetrics and Gynecology

## 2013-08-23 ENCOUNTER — Encounter (HOSPITAL_COMMUNITY): Payer: Self-pay | Admitting: *Deleted

## 2013-08-23 DIAGNOSIS — O479 False labor, unspecified: Secondary | ICD-10-CM | POA: Insufficient documentation

## 2013-08-23 NOTE — MAU Note (Signed)
PT SAYS SHE STARTED HURTING BAD   AT 730PM.    WAS IN OFFICE ON MON-  VE - 1 CM.  WAS SENT HERE ON Monday  FOR  U/S  AND NST-  ALL FINE-  VE  1-2 CM.       DENIES HSV AND  SAYS HAD MRSA WHEN SHE WAS [age]YEARS OLD.    GBS- NEG.

## 2013-08-24 DIAGNOSIS — O479 False labor, unspecified: Secondary | ICD-10-CM | POA: Diagnosis not present

## 2013-08-24 NOTE — Discharge Instructions (Signed)
Braxton Hicks Contractions °Contractions of the uterus can occur throughout pregnancy. Contractions are not always a sign that you are in labor.  °WHAT ARE BRAXTON HICKS CONTRACTIONS?  °Contractions that occur before labor are called Braxton Hicks contractions, or false labor. Toward the end of pregnancy (32-34 weeks), these contractions can develop more often and may become more forceful. This is not true labor because these contractions do not result in opening (dilatation) and thinning of the cervix. They are sometimes difficult to tell apart from true labor because these contractions can be forceful and people have different pain tolerances. You should not feel embarrassed if you go to the hospital with false labor. Sometimes, the only way to tell if you are in true labor is for your health care provider to look for changes in the cervix. °If there are no prenatal problems or other health problems associated with the pregnancy, it is completely safe to be sent home with false labor and await the onset of true labor. °HOW CAN YOU TELL THE DIFFERENCE BETWEEN TRUE AND FALSE LABOR? °False Labor °· The contractions of false labor are usually shorter and not as hard as those of true labor.   °· The contractions are usually irregular.   °· The contractions are often felt in the front of the lower abdomen and in the groin.   °· The contractions may go away when you walk around or change positions while lying down.   °· The contractions get weaker and are shorter lasting as time goes on.   °· The contractions do not usually become progressively stronger, regular, and closer together as with true labor.   °True Labor °· Contractions in true labor last 30-70 seconds, become very regular, usually become more intense, and increase in frequency.   °· The contractions do not go away with walking.   °· The discomfort is usually felt in the top of the uterus and spreads to the lower abdomen and low back.   °· True labor can be  determined by your health care provider with an exam. This will show that the cervix is dilating and getting thinner.   °WHAT TO REMEMBER °· Keep up with your usual exercises and follow other instructions given by your health care provider.   °· Take medicines as directed by your health care provider.   °· Keep your regular prenatal appointments.   °· Eat and drink lightly if you think you are going into labor.   °· If Braxton Hicks contractions are making you uncomfortable:   °¨ Change your position from lying down or resting to walking, or from walking to resting.   °¨ Sit and rest in a tub of warm water.   °¨ Drink 2-3 glasses of water. Dehydration may cause these contractions.   °¨ Do slow and deep breathing several times an hour.   °WHEN SHOULD I SEEK IMMEDIATE MEDICAL CARE? °Seek immediate medical care if: °· Your contractions become stronger, more regular, and closer together.   °· You have fluid leaking or gushing from your vagina.   °· You have a fever.   °· You pass blood-tinged mucus.   °· You have vaginal bleeding.   °· You have continuous abdominal pain.   °· You have low back pain that you never had before.   °· You feel your baby's head pushing down and causing pelvic pressure.   °· Your baby is not moving as much as it used to.   °Document Released: 02/01/2005 Document Revised: 02/06/2013 Document Reviewed: 11/13/2012 °Exit[employer]® Patient Information ©2015 [employer]. This information is not intended to replace advice given to you by your health care   provider. Make sure you discuss any questions you have with your health care provider. ° °Fetal Movement Counts °Patient Name: __________________________________________________ Patient Due Date: ____________________ °Performing a fetal movement count is highly recommended in high-risk pregnancies, but it is good for every pregnant woman to do. Your caregiver may ask you to start counting fetal movements at 28 weeks of the pregnancy. Fetal movements  often increase: °· After eating a full meal. °· After physical activity. °· After eating or drinking something sweet or cold. °· At rest. °Pay attention to when you feel the baby is most active. This will help you notice a pattern of your baby's sleep and wake cycles and what factors contribute to an increase in fetal movement. It is important to perform a fetal movement count at the same time each day when your baby is normally most active.  °HOW TO COUNT FETAL MOVEMENTS °1. Find a quiet and comfortable area to sit or lie down on your left side. Lying on your left side provides the best blood and oxygen circulation to your baby. °2. Write down the day and time on a sheet of paper or in a journal. °3. Start counting kicks, flutters, swishes, rolls, or jabs in a 2 hour period. You should feel at least 10 movements within 2 hours. °4. If you do not feel 10 movements in 2 hours, wait 2-3 hours and count again. Look for a change in the pattern or not enough counts in 2 hours. °SEEK MEDICAL CARE IF: °· You feel less than 10 counts in 2 hours, tried twice. °· There is no movement in over an hour. °· The pattern is changing or taking longer each day to reach 10 counts in 2 hours. °· You feel the baby is not moving as he or she usually does. °Date: ____________ Movements: ____________ Start time: ____________ Finish time: ____________  °Date: ____________ Movements: ____________ Start time: ____________ Finish time: ____________ °Date: ____________ Movements: ____________ Start time: ____________ Finish time: ____________ °Date: ____________ Movements: ____________ Start time: ____________ Finish time: ____________ °Date: ____________ Movements: ____________ Start time: ____________ Finish time: ____________ °Date: ____________ Movements: ____________ Start time: ____________ Finish time: ____________ °Date: ____________ Movements: ____________ Start time: ____________ Finish time: ____________ °Date: ____________  Movements: ____________ Start time: ____________ Finish time: ____________  °Date: ____________ Movements: ____________ Start time: ____________ Finish time: ____________ °Date: ____________ Movements: ____________ Start time: ____________ Finish time: ____________ °Date: ____________ Movements: ____________ Start time: ____________ Finish time: ____________ °Date: ____________ Movements: ____________ Start time: ____________ Finish time: ____________ °Date: ____________ Movements: ____________ Start time: ____________ Finish time: ____________ °Date: ____________ Movements: ____________ Start time: ____________ Finish time: ____________ °Date: ____________ Movements: ____________ Start time: ____________ Finish time: ____________  °Date: ____________ Movements: ____________ Start time: ____________ Finish time: ____________ °Date: ____________ Movements: ____________ Start time: ____________ Finish time: ____________ °Date: ____________ Movements: ____________ Start time: ____________ Finish time: ____________ °Date: ____________ Movements: ____________ Start time: ____________ Finish time: ____________ °Date: ____________ Movements: ____________ Start time: ____________ Finish time: ____________ °Date: ____________ Movements: ____________ Start time: ____________ Finish time: ____________ °Date: ____________ Movements: ____________ Start time: ____________ Finish time: ____________  °Date: ____________ Movements: ____________ Start time: ____________ Finish time: ____________ °Date: ____________ Movements: ____________ Start time: ____________ Finish time: ____________ °Date: ____________ Movements: ____________ Start time: ____________ Finish time: ____________ °Date: ____________ Movements: ____________ Start time: ____________ Finish time: ____________ °Date: ____________ Movements: ____________ Start time: ____________ Finish time: ____________ °Date: ____________ Movements: ____________ Start time:  ____________ Finish time: ____________ °Date: ____________ Movements: ____________   Start time: ____________ Finish time: ____________  °Date: ____________ Movements: ____________ Start time: ____________ Finish time: ____________ °Date: ____________ Movements: ____________ Start time: ____________ Finish time: ____________ °Date: ____________ Movements: ____________ Start time: ____________ Finish time: ____________ °Date: ____________ Movements: ____________ Start time: ____________ Finish time: ____________ °Date: ____________ Movements: ____________ Start time: ____________ Finish time: ____________ °Date: ____________ Movements: ____________ Start time: ____________ Finish time: ____________ °Date: ____________ Movements: ____________ Start time: ____________ Finish time: ____________  °Date: ____________ Movements: ____________ Start time: ____________ Finish time: ____________ °Date: ____________ Movements: ____________ Start time: ____________ Finish time: ____________ °Date: ____________ Movements: ____________ Start time: ____________ Finish time: ____________ °Date: ____________ Movements: ____________ Start time: ____________ Finish time: ____________ °Date: ____________ Movements: ____________ Start time: ____________ Finish time: ____________ °Date: ____________ Movements: ____________ Start time: ____________ Finish time: ____________ °Date: ____________ Movements: ____________ Start time: ____________ Finish time: ____________  °Date: ____________ Movements: ____________ Start time: ____________ Finish time: ____________ °Date: ____________ Movements: ____________ Start time: ____________ Finish time: ____________ °Date: ____________ Movements: ____________ Start time: ____________ Finish time: ____________ °Date: ____________ Movements: ____________ Start time: ____________ Finish time: ____________ °Date: ____________ Movements: ____________ Start time: ____________ Finish time: ____________ °Date:  ____________ Movements: ____________ Start time: ____________ Finish time: ____________ °Date: ____________ Movements: ____________ Start time: ____________ Finish time: ____________  °Date: ____________ Movements: ____________ Start time: ____________ Finish time: ____________ °Date: ____________ Movements: ____________ Start time: ____________ Finish time: ____________ °Date: ____________ Movements: ____________ Start time: ____________ Finish time: ____________ °Date: ____________ Movements: ____________ Start time: ____________ Finish time: ____________ °Date: ____________ Movements: ____________ Start time: ____________ Finish time: ____________ °Date: ____________ Movements: ____________ Start time: ____________ Finish time: ____________ °Document Released: 03/03/2006 Document Revised: 01/19/2012 Document Reviewed: 11/29/2011 °[employer]® Patient Information ©2015 [employer]. This information is not intended to replace advice given to you by your health care provider. Make sure you discuss any questions you have with your health care provider. ° °

## 2013-08-30 ENCOUNTER — Encounter (HOSPITAL_COMMUNITY): Payer: Self-pay

## 2013-08-30 ENCOUNTER — Inpatient Hospital Stay (HOSPITAL_COMMUNITY)
Admission: AD | Admit: 2013-08-30 | Discharge: 2013-08-30 | Disposition: A | Discharge status: Home / Self Care | Payer: Medicaid Other | Source: Ambulatory Visit | Attending: Obstetrics & Gynecology | Admitting: Obstetrics & Gynecology

## 2013-08-30 DIAGNOSIS — O479 False labor, unspecified: Secondary | ICD-10-CM | POA: Insufficient documentation

## 2013-08-30 NOTE — MAU Note (Signed)
Contractions. Denies LOF or vag bleeding. +FM

## 2013-09-01 ENCOUNTER — Inpatient Hospital Stay (HOSPITAL_COMMUNITY): Payer: Medicaid Other

## 2013-09-01 ENCOUNTER — Encounter (HOSPITAL_COMMUNITY): Payer: Self-pay | Admitting: *Deleted

## 2013-09-01 ENCOUNTER — Inpatient Hospital Stay (HOSPITAL_COMMUNITY)
Admission: AD | Admit: 2013-09-01 | Discharge: 2013-09-01 | Disposition: A | Discharge status: Home / Self Care | Payer: Medicaid Other | Source: Ambulatory Visit | Attending: Obstetrics and Gynecology | Admitting: Obstetrics and Gynecology

## 2013-09-01 DIAGNOSIS — O479 False labor, unspecified: Secondary | ICD-10-CM | POA: Insufficient documentation

## 2013-09-01 DIAGNOSIS — N949 Unspecified condition associated with female genital organs and menstrual cycle: Secondary | ICD-10-CM | POA: Diagnosis not present

## 2013-09-01 DIAGNOSIS — Z87891 Personal history of nicotine dependence: Secondary | ICD-10-CM | POA: Insufficient documentation

## 2013-09-01 DIAGNOSIS — R109 Unspecified abdominal pain: Secondary | ICD-10-CM | POA: Diagnosis not present

## 2013-09-01 DIAGNOSIS — O368190 Decreased fetal movements, unspecified trimester, not applicable or unspecified: Secondary | ICD-10-CM | POA: Insufficient documentation

## 2013-09-01 DIAGNOSIS — O26899 Other specified pregnancy related conditions, unspecified trimester: Secondary | ICD-10-CM

## 2013-09-01 DIAGNOSIS — O409XX0 Polyhydramnios, unspecified trimester, not applicable or unspecified: Secondary | ICD-10-CM | POA: Diagnosis not present

## 2013-09-01 IMAGING — US US FETAL BPP W/O NONSTRESS
1 series · 13 of 20 positions shown · non-contrast
Comparison: none

[Series 1: us fetal bpp w/o nonstress · non-contrast · 20 acquisitions, 13 frames shown]
[im 1/20]
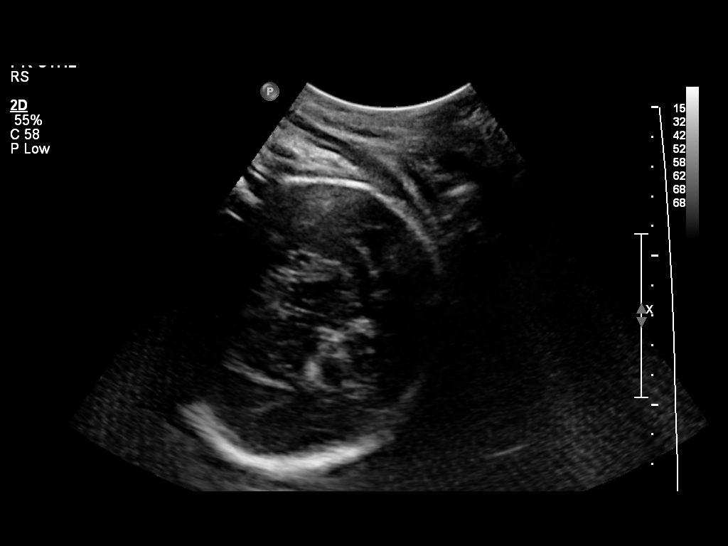
[im 3/20]
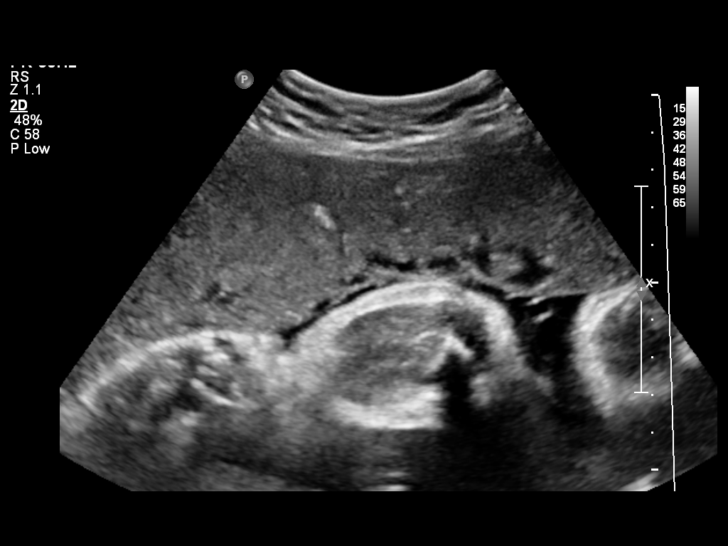
[im 4/20]
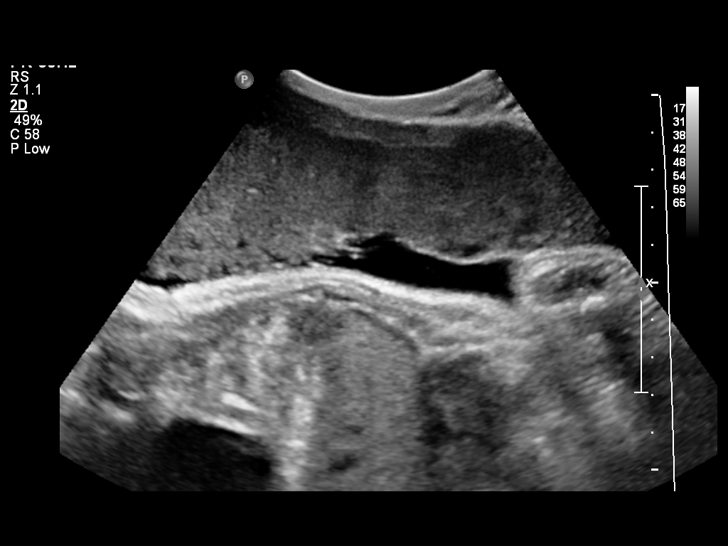
[im 6/20]
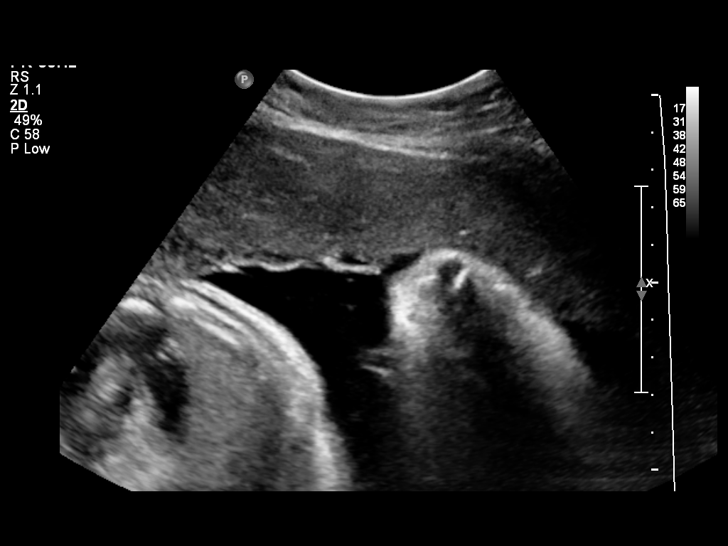
[im 7/20]
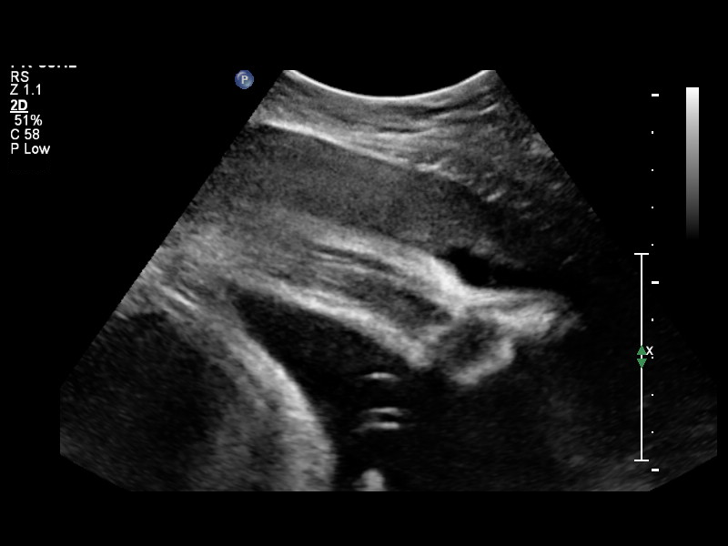
[im 9/20]
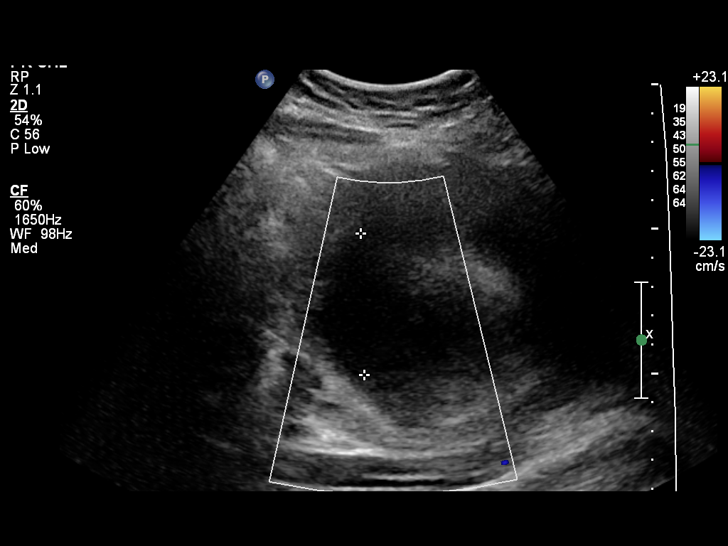
[im 11/20]
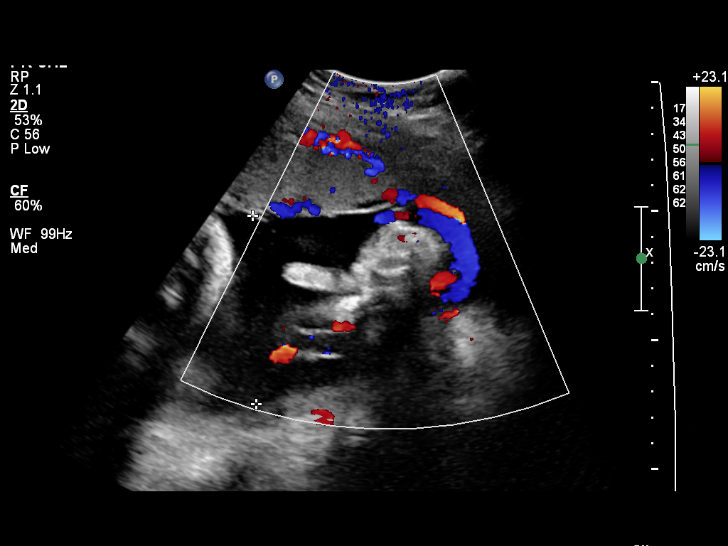
[im 12/20]
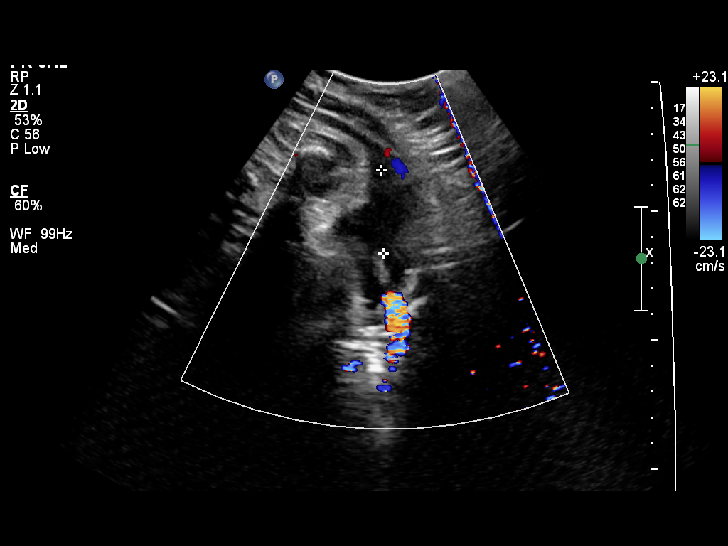
[im 14/20]
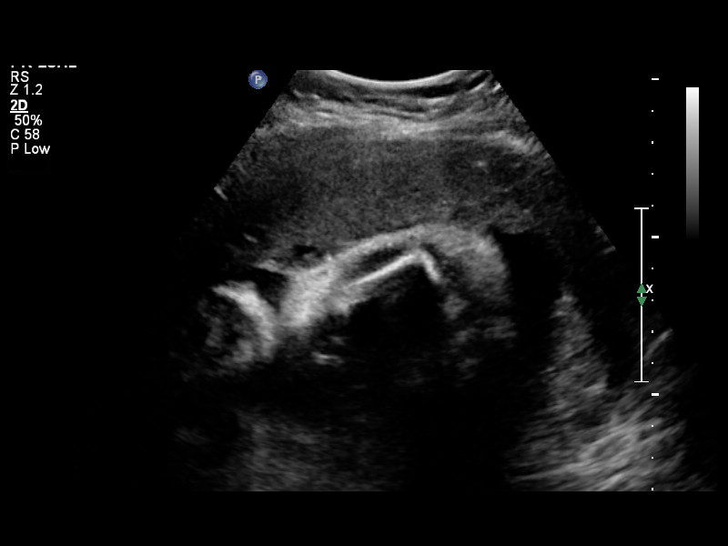
[im 15/20]
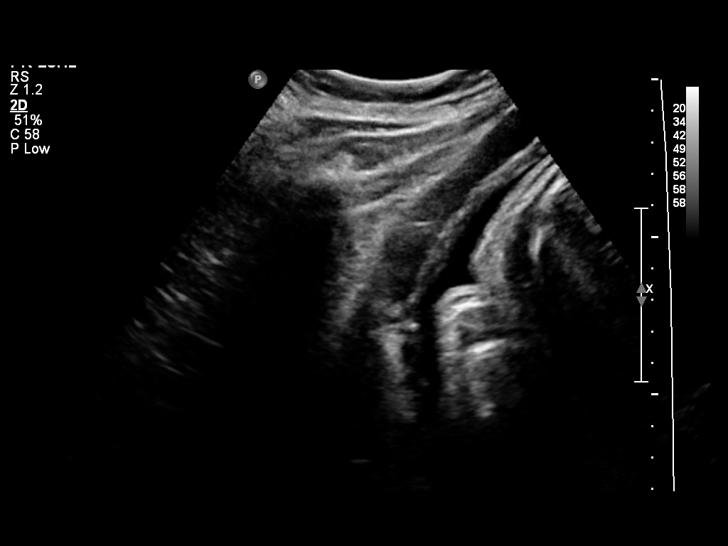
[im 17/20]
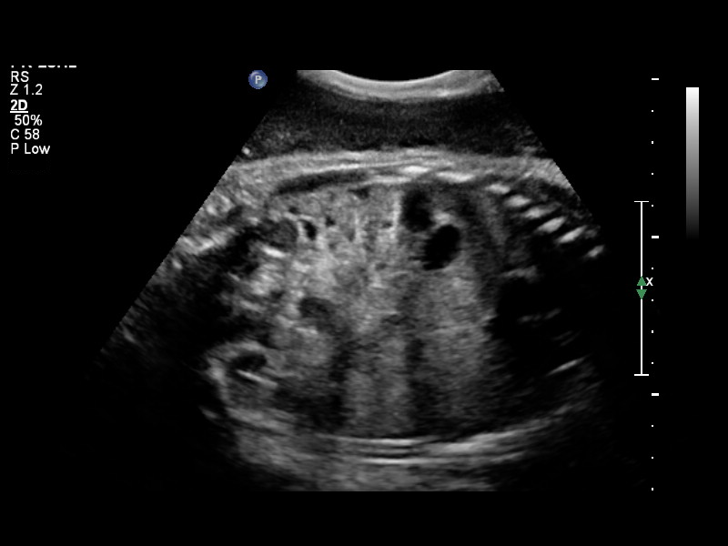
[im 18/20]
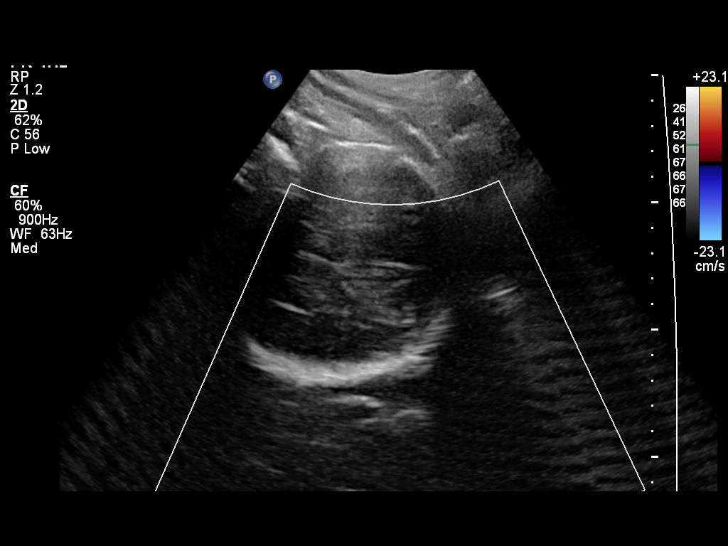
[im 20/20]
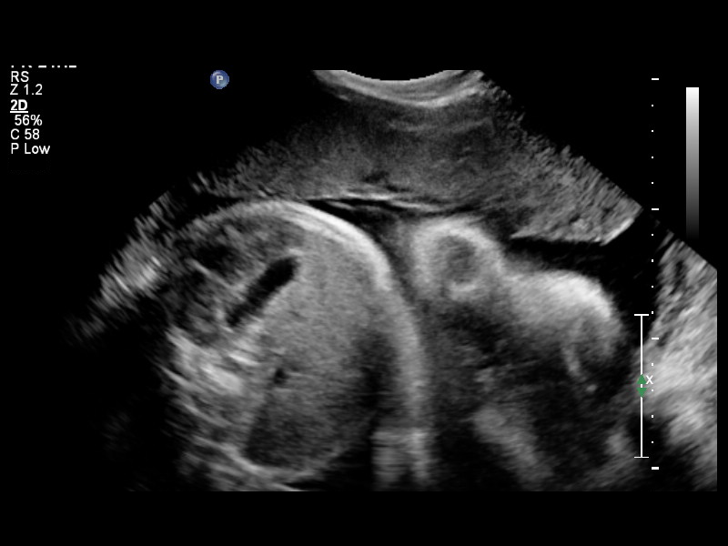

[13 of 20 positions shown; findings below may reference images not displayed]

OBSTETRICS REPORT
                      (Signed Final [DATE] [DATE])

Service(s) Provided

 [HOSPITAL]                                         76815.0
Indications

 Decreased fetal movement
 Non-reactive NST
 Asthma
 Poor obstetric history: Previous gestational          [X7]
 diabetes
Fetal Evaluation

 Num Of Fetuses:    1
 Fetal Heart Rate:  140                          bpm
 Cardiac Activity:  Observed
 Presentation:      Cephalic
 Placenta:          Anterior, above cervical os

 Amniotic Fluid
 AFI FV:      Polyhydramnios
 AFI Sum:     23.92   cm       96  %Tile     Larg Pckt:    8.54  cm
 RUQ:   4.88    cm   RLQ:    3.22   cm    LUQ:   8.54    cm   LLQ:    7.28   cm
Biophysical Evaluation

 Amniotic F.V:   Polyhydramnios             F. Tone:         Observed
 F. Movement:    Observed                   Score:           [DATE]
 F. Breathing:   Observed
Gestational Age

 LMP:           38w 6d        Date:  [DATE]                 EDD:   [DATE]
 Best:          38w 6d     Det. By:  LMP  ([DATE])          EDD:   [DATE]
Cervix Uterus Adnexa

 Cervix:       Not visualized (advanced GA >[X7])

 Adnexa:     No abnormality visualized.
Impression

 Single living intrauterine pregnancy at 38 weeks 6 days.
 Polyhydramnios.
 BPP [DATE].
Recommendations

 Follow-up ultrasounds as clinically indicated.

                OXENDINE

## 2013-09-01 MED ORDER — ACETAMINOPHEN 500 MG PO TABS
1000.0000 mg | ORAL_TABLET | Freq: Once | ORAL | Status: AC
  Administered 2013-09-01: 1000 mg via ORAL
  Filled 2013-09-01: qty 2

## 2013-09-01 NOTE — MAU Note (Signed)
Ctx every 5 minutes since yesterday. Denies LOF or vaginal bleeding. Decreased fetal movement.

## 2013-09-01 NOTE — Discharge Instructions (Signed)
Braxton Hicks Contractions °Contractions of the uterus can occur throughout pregnancy. Contractions are not always a sign that you are in labor.  °WHAT ARE BRAXTON HICKS CONTRACTIONS?  °Contractions that occur before labor are called Braxton Hicks contractions, or false labor. Toward the end of pregnancy (32-34 weeks), these contractions can develop more often and may become more forceful. This is not true labor because these contractions do not result in opening (dilatation) and thinning of the cervix. They are sometimes difficult to tell apart from true labor because these contractions can be forceful and people have different pain tolerances. You should not feel embarrassed if you go to the hospital with false labor. Sometimes, the only way to tell if you are in true labor is for your health care provider to look for changes in the cervix. °If there are no prenatal problems or other health problems associated with the pregnancy, it is completely safe to be sent home with false labor and await the onset of true labor. °HOW CAN YOU TELL THE DIFFERENCE BETWEEN TRUE AND FALSE LABOR? °False Labor °· The contractions of false labor are usually shorter and not as hard as those of true labor.   °· The contractions are usually irregular.   °· The contractions are often felt in the front of the lower abdomen and in the groin.   °· The contractions may go away when you walk around or change positions while lying down.   °· The contractions get weaker and are shorter lasting as time goes on.   °· The contractions do not usually become progressively stronger, regular, and closer together as with true labor.   °True Labor °· Contractions in true labor last 30-70 seconds, become very regular, usually become more intense, and increase in frequency.   °· The contractions do not go away with walking.   °· The discomfort is usually felt in the top of the uterus and spreads to the lower abdomen and low back.   °· True labor can be  determined by your health care provider with an exam. This will show that the cervix is dilating and getting thinner.   °WHAT TO REMEMBER °· Keep up with your usual exercises and follow other instructions given by your health care provider.   °· Take medicines as directed by your health care provider.   °· Keep your regular prenatal appointments.   °· Eat and drink lightly if you think you are going into labor.   °· If Braxton Hicks contractions are making you uncomfortable:   °¨ Change your position from lying down or resting to walking, or from walking to resting.   °¨ Sit and rest in a tub of warm water.   °¨ Drink 2-3 glasses of water. Dehydration may cause these contractions.   °¨ Do slow and deep breathing several times an hour.   °WHEN SHOULD I SEEK IMMEDIATE MEDICAL CARE? °Seek immediate medical care if: °· Your contractions become stronger, more regular, and closer together.   °· You have fluid leaking or gushing from your vagina.   °· You have a fever.   °· You pass blood-tinged mucus.   °· You have vaginal bleeding.   °· You have continuous abdominal pain.   °· You have low back pain that you never had before.   °· You feel your baby's head pushing down and causing pelvic pressure.   °· Your baby is not moving as much as it used to.   °Document Released: 02/01/2005 Document Revised: 02/06/2013 Document Reviewed: 11/13/2012 °[employer]® Patient Information ©2015 [employer]. This information is not intended to replace advice given to you by your health care   provider. Make sure you discuss any questions you have with your health care provider. ° °

## 2013-09-01 NOTE — Progress Notes (Signed)
SVE before u/s per pt request

## 2013-09-01 NOTE — MAU Note (Signed)
Joseph BerkshireJulie Ethier PA aware of pt's c/o of decreased FM since last pm. Strip reviewed. Will see pt.

## 2013-09-01 NOTE — Progress Notes (Signed)
Written and verbal d/c instructions given. "I don't want this crap. I'm not coming back. I'm gonna have this baby at home"

## 2013-09-01 NOTE — Progress Notes (Signed)
Joseph BerkshireJulie Ethier PA aware of pt's request for med for headache. Water and PB and crackers to pt. Pt states has eaten and drank a lot today at her baby shower

## 2013-09-01 NOTE — Progress Notes (Signed)
Dr Dareen PianoAnderson called in with order for BPP and AFI. U/S called

## 2013-09-01 NOTE — MAU Provider Note (Signed)
History     CSN: [id number]  Arrival date and time: 09/01/13 1847   None     Chief Complaint  Patient presents with  . Labor Eval   HPI [name] is a 23 y.o. [id number] at 7455w6d who presents to MAU today with complaint of lower abdominal and pelvic pain and decreased fetal movement. The patient states that the abdominal pain started last night. She is having irregular contractions, cramping and sharp pains. She denies vaginal bleeding LOF, N/V, fever or UTI symptoms. She states decreased fetal movement since the pain started last night.  OB History   Grav Para Term Preterm Abortions TAB SAB Ect Mult Living   4 3 3       3       Past Medical History  Diagnosis Date  . Asthma   . NVD (normal vaginal delivery) 08/30/2010  . Gestational diabetes   . Hx MRSA infection 2005  . Trichimoniasis   . Chlamydia   . Gonorrhea     Past Surgical History  Procedure Laterality Date  . No past surgeries    . Multiple tooth extractions      Family History  Problem Relation Age of Onset  . Anesthesia problems Neg Hx   . Other Neg Hx   . Diabetes Maternal Grandmother   . Hypertension Father   . Heart disease Father   . Lung disease Father     History  Substance Use Topics  . Smoking status: Former Smoker -- 0.25 packs/day for 5 years    Types: Cigarettes    Quit date: 12/27/2012  . Smokeless tobacco: Never Used  . Alcohol Use: No    Allergies:  Allergies  Allergen Reactions  . Iodine Other (See Comments)    blistering  . Sulfonamide Derivatives Other (See Comments)    blistering    Prescriptions prior to admission  Medication Sig Dispense Refill  . albuterol (PROVENTIL HFA;VENTOLIN HFA) 108 (90 BASE) MCG/ACT inhaler Inhale 2 puffs into the lungs every 6 (six) hours as needed for wheezing or shortness of breath.      . cyclobenzaprine (FLEXERIL) 10 MG tablet Take 10 mg by mouth 3 (three) times daily as needed for muscle spasms.      . Prenatal Vit-Fe  Fumarate-FA (PRENATAL MULTIVITAMIN) TABS tablet Take 1 tablet by mouth daily.         Review of Systems  Constitutional: Negative for fever and malaise/fatigue.  Gastrointestinal: Positive for abdominal pain. Negative for nausea, vomiting, diarrhea and constipation.  Genitourinary: Negative for dysuria, urgency and frequency.       Neg - vaginal bleeding, discharge, LOF   Physical Exam   Blood pressure 124/74, pulse 92, temperature 98.7 F (37.1 C), temperature source Oral, resp. rate 20, height 5\' 4"  (1.626 m), weight 206 lb 12.8 oz (93.804 kg), last menstrual period 12/03/2012, SpO2 100.00%, not currently breastfeeding.  Physical Exam  Constitutional: She is oriented to person, place, and time. She appears well-developed and well-nourished. No distress.  HENT:  Head: Normocephalic.  Cardiovascular: Normal rate.   Respiratory: Effort normal.  GI: Soft. She exhibits no distension and no mass. There is tenderness (mild LLQ tenderness to palpation). There is no rebound and no guarding.  Neurological: She is alert and oriented to person, place, and time.  Skin: Skin is warm and dry. No erythema.  Psychiatric: She has a normal mood and affect.   No results found for this or any previous visit (from the past  24 hour(s)).  Fetal Monitoring: Baseline: 140 bpm, minimal variability, no accelerations, no decelerations Contractions: none, moderate UI 1945 - patient given juice, water and food per Dr. Dareen Piano Baseline: 130 bpm, moderate variability,  Contractions: q 8 minutes MAU Course  Procedures None  MDM Discussed with Dr. Dareen Piano. Give patient something to eat and drink and continue to monitor Patient called out stating that she wants something for a headache. 1000 mg Tylenol given Dr. Dareen Piano called back to MAU and states that he now wants a BPP and AFI.  2100 - patient is in [place]. Care turned over to Pamelia Hoit, NP No results found for this or any previous visit (from the  past 24 hour(s)).  Freddi Starr, PA-C  09/01/2013, 8:24 PM  Ultrasound results discussed with Dr. Dareen Piano FHR reactive D/c home Assessment and Plan  Abdominal pain in pregnancy NST reactive BPP 8/8 with polyhydramnios F/u with OB appointment on Monday as scheduled

## 2013-09-01 NOTE — MAU Note (Signed)
Wende BushyS. Lineberry NP called u/s report to Dr Dareen PianoAnderson. Pt may go home

## 2013-09-01 NOTE — MAU Note (Signed)
Pt voiced feelings that she does not like MD on call and will not keep appt on Monday. She does not know with whom her appt is on Monday. "I just wanna have this baby. I'm over this":

## 2013-09-03 ENCOUNTER — Inpatient Hospital Stay (HOSPITAL_COMMUNITY)
Admission: AD | Admit: 2013-09-03 | Discharge: 2013-09-06 | DRG: 774 | Disposition: A | Discharge status: Home / Self Care | Payer: Medicaid Other | Source: Ambulatory Visit | Attending: Obstetrics and Gynecology | Admitting: Obstetrics and Gynecology

## 2013-09-03 ENCOUNTER — Encounter (HOSPITAL_COMMUNITY): Payer: Self-pay | Admitting: *Deleted

## 2013-09-03 ENCOUNTER — Encounter (HOSPITAL_COMMUNITY): Payer: Medicaid Other | Admitting: Anesthesiology

## 2013-09-03 ENCOUNTER — Inpatient Hospital Stay (HOSPITAL_COMMUNITY): Payer: Medicaid Other | Admitting: Anesthesiology

## 2013-09-03 DIAGNOSIS — O409XX0 Polyhydramnios, unspecified trimester, not applicable or unspecified: Secondary | ICD-10-CM | POA: Diagnosis present

## 2013-09-03 DIAGNOSIS — J45909 Unspecified asthma, uncomplicated: Secondary | ICD-10-CM | POA: Diagnosis present

## 2013-09-03 DIAGNOSIS — Z8614 Personal history of Methicillin resistant Staphylococcus aureus infection: Secondary | ICD-10-CM | POA: Diagnosis not present

## 2013-09-03 DIAGNOSIS — Z87891 Personal history of nicotine dependence: Secondary | ICD-10-CM

## 2013-09-03 DIAGNOSIS — O368190 Decreased fetal movements, unspecified trimester, not applicable or unspecified: Secondary | ICD-10-CM | POA: Diagnosis present

## 2013-09-03 DIAGNOSIS — Z8632 Personal history of gestational diabetes: Secondary | ICD-10-CM | POA: Diagnosis not present

## 2013-09-03 DIAGNOSIS — IMO0001 Reserved for inherently not codable concepts without codable children: Secondary | ICD-10-CM

## 2013-09-03 LAB — CBC
HCT: 30.2 % — ABNORMAL LOW (ref 36.0–46.0)
HEMOGLOBIN: 10.8 g/dL — AB (ref 12.0–15.0)
MCH: 28.6 pg (ref 26.0–34.0)
MCHC: 35.8 g/dL (ref 30.0–36.0)
MCV: 79.9 fL (ref 78.0–100.0)
PLATELETS: 206 10*3/uL (ref 150–400)
RBC: 3.78 MIL/uL — AB (ref 3.87–5.11)
RDW: 13.6 % (ref 11.5–15.5)
WBC: 11.4 10*3/uL — AB (ref 4.0–10.5)

## 2013-09-03 LAB — TYPE AND SCREEN
ABO/RH(D): O POS
ANTIBODY SCREEN: NEGATIVE

## 2013-09-03 MED ORDER — ONDANSETRON HCL 4 MG/2ML IJ SOLN
4.0000 mg | Freq: Four times a day (QID) | INTRAMUSCULAR | Status: DC | PRN
  Administered 2013-09-03: 4 mg via INTRAVENOUS
  Filled 2013-09-03: qty 2

## 2013-09-03 MED ORDER — OXYTOCIN 40 UNITS IN LACTATED RINGERS INFUSION - SIMPLE MED
1.0000 m[IU]/min | INTRAVENOUS | Status: DC
  Administered 2013-09-03: 2 m[IU]/min via INTRAVENOUS

## 2013-09-03 MED ORDER — LIDOCAINE HCL (PF) 1 % IJ SOLN
INTRAMUSCULAR | Status: DC | PRN
  Administered 2013-09-03 (×2): 8 mL

## 2013-09-03 MED ORDER — DIPHENHYDRAMINE HCL 50 MG/ML IJ SOLN: 12.5000 mg | INTRAMUSCULAR | Status: DC | PRN

## 2013-09-03 MED ORDER — ACETAMINOPHEN 325 MG PO TABS: 650.0000 mg | ORAL_TABLET | ORAL | Status: DC | PRN

## 2013-09-03 MED ORDER — OXYTOCIN BOLUS FROM INFUSION: 500.0000 mL | INTRAVENOUS | Status: DC

## 2013-09-03 MED ORDER — PHENYLEPHRINE 40 MCG/ML (10ML) SYRINGE FOR IV PUSH (FOR BLOOD PRESSURE SUPPORT)
80.0000 ug | PREFILLED_SYRINGE | INTRAVENOUS | Status: DC | PRN
  Filled 2013-09-03: qty 2
  Filled 2013-09-03: qty 10

## 2013-09-03 MED ORDER — EPHEDRINE 5 MG/ML INJ
10.0000 mg | INTRAVENOUS | Status: DC | PRN
  Filled 2013-09-03: qty 2

## 2013-09-03 MED ORDER — FENTANYL 2.5 MCG/ML BUPIVACAINE 1/10 % EPIDURAL INFUSION (WH - ANES)
INTRAMUSCULAR | Status: DC | PRN
  Administered 2013-09-03: 14 mL/h via EPIDURAL

## 2013-09-03 MED ORDER — CITRIC ACID-SODIUM CITRATE 334-500 MG/5ML PO SOLN: 30.0000 mL | ORAL | Status: DC | PRN

## 2013-09-03 MED ORDER — FENTANYL 2.5 MCG/ML BUPIVACAINE 1/10 % EPIDURAL INFUSION (WH - ANES)
14.0000 mL/h | INTRAMUSCULAR | Status: DC | PRN
  Filled 2013-09-03: qty 125

## 2013-09-03 MED ORDER — LACTATED RINGERS IV SOLN
INTRAVENOUS | Status: DC
Start: 2013-09-03 — End: 2013-09-04
  Administered 2013-09-03 (×2): via INTRAVENOUS

## 2013-09-03 MED ORDER — IBUPROFEN 600 MG PO TABS
600.0000 mg | ORAL_TABLET | Freq: Four times a day (QID) | ORAL | Status: DC | PRN
  Administered 2013-09-04: 600 mg via ORAL
  Filled 2013-09-03: qty 1

## 2013-09-03 MED ORDER — OXYTOCIN 40 UNITS IN LACTATED RINGERS INFUSION - SIMPLE MED
62.5000 mL/h | INTRAVENOUS | Status: DC
  Filled 2013-09-03: qty 1000

## 2013-09-03 MED ORDER — PHENYLEPHRINE 40 MCG/ML (10ML) SYRINGE FOR IV PUSH (FOR BLOOD PRESSURE SUPPORT)
80.0000 ug | PREFILLED_SYRINGE | INTRAVENOUS | Status: DC | PRN
  Filled 2013-09-03: qty 2

## 2013-09-03 MED ORDER — OXYCODONE-ACETAMINOPHEN 5-325 MG PO TABS
1.0000 | ORAL_TABLET | ORAL | Status: DC | PRN
Start: 2013-09-03 — End: 2013-09-04

## 2013-09-03 MED ORDER — LACTATED RINGERS IV SOLN
500.0000 mL | Freq: Once | INTRAVENOUS | Status: AC
  Administered 2013-09-03: 500 mL via INTRAVENOUS

## 2013-09-03 MED ORDER — FLEET ENEMA 7-19 GM/118ML RE ENEM: 1.0000 | ENEMA | RECTAL | Status: DC | PRN

## 2013-09-03 MED ORDER — LIDOCAINE HCL (PF) 1 % IJ SOLN
30.0000 mL | INTRAMUSCULAR | Status: DC | PRN
  Filled 2013-09-03: qty 30

## 2013-09-03 MED ORDER — LACTATED RINGERS IV SOLN: 500.0000 mL | INTRAVENOUS | Status: DC | PRN

## 2013-09-03 MED ORDER — TERBUTALINE SULFATE 1 MG/ML IJ SOLN: 0.2500 mg | Freq: Once | INTRAMUSCULAR | Status: AC | PRN

## 2013-09-03 NOTE — Anesthesia Procedure Notes (Signed)
Epidural Patient location during procedure: OB Start time: 09/03/2013 9:38 PM End time: 09/03/2013 9:42 PM  Staffing Anesthesiologist: Leilani AbleHATCHETT, Zanyia Silbaugh Performed by: anesthesiologist   Preanesthetic Checklist Completed: patient identified, surgical consent, pre-op evaluation, timeout performed, IV checked, risks and benefits discussed and monitors and equipment checked  Epidural Patient position: sitting Prep: ChloraPrep and site prepped and draped Patient monitoring: continuous pulse ox and blood pressure Approach: midline Location: L3-L4 Injection technique: LOR air  Needle:  Needle type: Tuohy  Needle gauge: 17 G Needle length: 9 cm and 9 Needle insertion depth: 7 cm Catheter type: closed end flexible Catheter size: 19 Gauge Catheter at skin depth: 12 cm Test dose: negative and Other  Assessment Sensory level: T9 Events: blood not aspirated, injection not painful, no injection resistance, negative IV test and no paresthesia  Additional Notes Reason for block:procedure for pain

## 2013-09-03 NOTE — Progress Notes (Signed)
Pt comfortable s/p epidural. FHT 120 mod var +accels, no decels TOCO q2-3 SVE 4/80/-1 Continue pitocin 2x2

## 2013-09-03 NOTE — H&P (Signed)
23 y.o. 64100w1d  [id number] comes in from office for IOL for persisent decreased fetal movement and polyhydramnios in the setting of a favorable cervix.  Otherwise has no LOF and no bleeding.  Past Medical History  Diagnosis Date  . Asthma   . NVD (normal vaginal delivery) 08/30/2010  . Gestational diabetes   . Hx MRSA infection 2005  . Trichimoniasis   . Chlamydia   . Gonorrhea     Past Surgical History  Procedure Laterality Date  . No past surgeries    . Multiple tooth extractions      OB History  Gravida Para Term Preterm AB SAB TAB Ectopic Multiple Living  4 3 3       3     # Outcome Date GA Lbr Len/2nd Weight Sex Delivery Anes PTL Lv  4 CUR           3 TRM 12/07/11 8169w4d / 00:04 3.104 kg (6 lb 13.5 oz) F SVD None  Y  2 TRM 08/30/10 4848w5d 03:25 / 12:30 3.09 kg (6 lb 13 oz) M SVD None  Y     Comments: diabetic- oral meds  1 TRM 06/16/07    M SVD   Y      History   Social History  . Marital Status: Single    Spouse Name: N/A    Number of Children: N/A  . Years of Education: N/A   Occupational History  . Not on file.   Social History Main Topics  . Smoking status: Former Smoker -- 0.25 packs/day for 5 years    Types: Cigarettes    Quit date: 12/27/2012  . Smokeless tobacco: Never Used  . Alcohol Use: No  . Drug Use: No  . Sexual Activity: Yes    Birth Control/ Protection: None     Comment: last intercourse 4 days ago   Other Topics Concern  . Not on file   Social History Narrative  . No narrative on file   Iodine and Sulfonamide derivatives    Prenatal Transfer Tool  Maternal Diabetes: No Genetic Screening: Declined Maternal Ultrasounds/Referrals: Normal Fetal Ultrasounds or other Referrals:  None Maternal Substance Abuse:  No Significant Maternal Medications:  None Significant Maternal Lab Results: Lab values include: Group B Strep negative  Other PNC: uncomplicated, late care at 18 weeks    Filed Vitals:   09/03/13 1920  BP: 129/79  Pulse: 80   Temp:   Resp:      Lungs/Cor:  NAD Abdomen:  soft, gravid Ex:  no cords, erythema SVE:  4/70/-1 FHTs:  140, good STV, NST R Toco:  q2-3   A/P   Admit for IOL for decreased fetal movement, polyhydramnios and favorable cervix  GBS Neg  Epidural if desired  AROM - clear  Pitocin 2x2 if no change in 2 hrs.  Other routine care.  Philip AspenALLAHAN, Michaiah Holsopple

## 2013-09-03 NOTE — Anesthesia Preprocedure Evaluation (Signed)
Anesthesia Evaluation  Patient identified by MRN, date of birth, ID band Patient awake    Reviewed: Allergy & Precautions, H&P , NPO status , Patient's Chart, lab work & pertinent test results  Airway Mallampati: II TM Distance: >3 FB Neck ROM: full    Dental no notable dental hx.    Pulmonary former smoker,    Pulmonary exam normal       Cardiovascular negative cardio ROS      Neuro/Psych negative neurological ROS  negative psych ROS   GI/Hepatic negative GI ROS, Neg liver ROS,   Endo/Other  negative endocrine ROSdiabetes  Renal/GU negative Renal ROS     Musculoskeletal   Abdominal Normal abdominal exam  (+)   Peds  Hematology negative hematology ROS (+)   Anesthesia Other Findings   Reproductive/Obstetrics (+) Pregnancy                           Anesthesia Physical Anesthesia Plan  ASA: II  Anesthesia Plan: Epidural   Post-op Pain Management:    Induction:   Airway Management Planned:   Additional Equipment:   Intra-op Plan:   Post-operative Plan:   Informed Consent: I have reviewed the patients History and Physical, chart, labs and discussed the procedure including the risks, benefits and alternatives for the proposed anesthesia with the patient or authorized representative who has indicated his/her understanding and acceptance.     Plan Discussed with:   Anesthesia Plan Comments:         Anesthesia Quick Evaluation

## 2013-09-04 ENCOUNTER — Encounter (HOSPITAL_COMMUNITY): Payer: Self-pay | Admitting: *Deleted

## 2013-09-04 LAB — CBC
HEMATOCRIT: 28.8 % — AB (ref 36.0–46.0)
HEMOGLOBIN: 10.2 g/dL — AB (ref 12.0–15.0)
MCH: 28.3 pg (ref 26.0–34.0)
MCHC: 35.4 g/dL (ref 30.0–36.0)
MCV: 79.8 fL (ref 78.0–100.0)
Platelets: 200 10*3/uL (ref 150–400)
RBC: 3.61 MIL/uL — AB (ref 3.87–5.11)
RDW: 13.6 % (ref 11.5–15.5)
WBC: 15.5 10*3/uL — AB (ref 4.0–10.5)

## 2013-09-04 LAB — RPR

## 2013-09-04 MED ORDER — PRENATAL MULTIVITAMIN CH
1.0000 | ORAL_TABLET | Freq: Every day | ORAL | Status: DC
  Administered 2013-09-04 – 2013-09-06 (×3): 1 via ORAL
  Filled 2013-09-04 (×3): qty 1

## 2013-09-04 MED ORDER — DIBUCAINE 1 % RE OINT: 1.0000 "application " | TOPICAL_OINTMENT | RECTAL | Status: DC | PRN

## 2013-09-04 MED ORDER — METHYLERGONOVINE MALEATE 0.2 MG PO TABS
0.2000 mg | ORAL_TABLET | ORAL | Status: AC
  Administered 2013-09-04 – 2013-09-05 (×6): 0.2 mg via ORAL
  Filled 2013-09-04 (×6): qty 1

## 2013-09-04 MED ORDER — WITCH HAZEL-GLYCERIN EX PADS: 1.0000 "application " | MEDICATED_PAD | CUTANEOUS | Status: DC | PRN

## 2013-09-04 MED ORDER — ONDANSETRON HCL 4 MG PO TABS
4.0000 mg | ORAL_TABLET | ORAL | Status: DC | PRN
  Administered 2013-09-05 – 2013-09-06 (×2): 4 mg via ORAL
  Filled 2013-09-04 (×2): qty 1

## 2013-09-04 MED ORDER — TETANUS-DIPHTH-ACELL PERTUSSIS 5-2.5-18.5 LF-MCG/0.5 IM SUSP: 0.5000 mL | Freq: Once | INTRAMUSCULAR | Status: DC

## 2013-09-04 MED ORDER — OXYCODONE-ACETAMINOPHEN 5-325 MG PO TABS
1.0000 | ORAL_TABLET | ORAL | Status: DC | PRN
  Administered 2013-09-04 – 2013-09-06 (×10): 2 via ORAL
  Filled 2013-09-04 (×10): qty 2

## 2013-09-04 MED ORDER — ALBUTEROL SULFATE (2.5 MG/3ML) 0.083% IN NEBU
3.0000 mL | INHALATION_SOLUTION | Freq: Four times a day (QID) | RESPIRATORY_TRACT | Status: DC | PRN
  Administered 2013-09-04 – 2013-09-06 (×9): 3 mL via RESPIRATORY_TRACT
  Filled 2013-09-04 (×9): qty 3

## 2013-09-04 MED ORDER — ZOLPIDEM TARTRATE 5 MG PO TABS: 5.0000 mg | ORAL_TABLET | Freq: Every evening | ORAL | Status: DC | PRN

## 2013-09-04 MED ORDER — DIPHENHYDRAMINE HCL 25 MG PO CAPS: 25.0000 mg | ORAL_CAPSULE | Freq: Four times a day (QID) | ORAL | Status: DC | PRN

## 2013-09-04 MED ORDER — PNEUMOCOCCAL VAC POLYVALENT 25 MCG/0.5ML IJ INJ
0.5000 mL | INJECTION | INTRAMUSCULAR | Status: DC
  Filled 2013-09-04: qty 0.5

## 2013-09-04 MED ORDER — BENZOCAINE-MENTHOL 20-0.5 % EX AERO
1.0000 "application " | INHALATION_SPRAY | CUTANEOUS | Status: DC | PRN
  Administered 2013-09-04: 1 via TOPICAL
  Filled 2013-09-04: qty 56

## 2013-09-04 MED ORDER — SIMETHICONE 80 MG PO CHEW: 80.0000 mg | CHEWABLE_TABLET | ORAL | Status: DC | PRN

## 2013-09-04 MED ORDER — SENNOSIDES-DOCUSATE SODIUM 8.6-50 MG PO TABS
2.0000 | ORAL_TABLET | ORAL | Status: DC
  Administered 2013-09-04 – 2013-09-05 (×3): 2 via ORAL
  Filled 2013-09-04 (×3): qty 2

## 2013-09-04 MED ORDER — IBUPROFEN 600 MG PO TABS
600.0000 mg | ORAL_TABLET | Freq: Four times a day (QID) | ORAL | Status: DC
  Administered 2013-09-04 – 2013-09-06 (×10): 600 mg via ORAL
  Filled 2013-09-04 (×10): qty 1

## 2013-09-04 MED ORDER — LANOLIN HYDROUS EX OINT: TOPICAL_OINTMENT | CUTANEOUS | Status: DC | PRN

## 2013-09-04 MED ORDER — ONDANSETRON HCL 4 MG/2ML IJ SOLN: 4.0000 mg | INTRAMUSCULAR | Status: DC | PRN

## 2013-09-04 NOTE — Progress Notes (Signed)
Post Partum Day 1 Subjective: no complaints, up ad lib, voiding and tolerating PO  Objective: Blood pressure 111/71, pulse 71, temperature 98 F (36.7 C), temperature source Oral, resp. rate 18, height 5\' 4"  (1.626 m), weight 93.441 kg (206 lb), last menstrual period 12/03/2012, SpO2 99.00%, unknown if currently breastfeeding.  Physical Exam:  General: alert, cooperative and appears stated age Lochia: appropriate Uterine Fundus: firm   Recent Labs  09/03/13 1804 09/04/13 0545  HGB 10.8* 10.2*  HCT 30.2* 28.8*    Assessment/Plan: Plan for discharge tomorrow and Breastfeeding   LOS: 1 day   Shameca Landen H. 09/04/2013, 9:32 AM

## 2013-09-04 NOTE — Progress Notes (Signed)
Dr. Tenny Crawoss called after Pt. Passed a baxeball size blood clot with fundal massage done by Doloris Hallebbie Nix at 1530. Pad changed and clot saved for MD. The methergine series was bugun by Doloris Hallebbie Nix RN with Dr. Charlott Rakesoss's order at 1445.

## 2013-09-04 NOTE — Progress Notes (Signed)
Patient called nurse to room after feeling gush of blood while nursing. Pad  3/4 saturated with blood with lemon sized clot partially expressed. Patient assisted up to bathroom, voided large amount and several more  small, stringy clots expelled. Back to bed, fundus firm at u/2, no trickling evident. BP 122/83, pulse 85, O2 sat-100%. Dr. Waynard ReedsKendra Ross notified of  this. Orders received to begin methergine 0.2mg  PO q4h x 6 doses. Will continue to monitor closely

## 2013-09-04 NOTE — Progress Notes (Signed)
Dr. Tenny Crawoss called about plum sized clot passed in toilet after fundal massage with fundal check produced no trickle and no clots. Pt is on methergine series. Dr. Tenny Crawoss to be on call tonight and Dr. Mora ApplPinn in am.

## 2013-09-04 NOTE — Lactation Note (Signed)
This note was copied from the chart of [facility]. Lactation Consultation Note Experienced BF one child she BF for 2 months, and another she BF for 8 months w/o challenges. States this baby is latching on well. Baby sleeping soundly. Mom encouraged to feed baby 8-12 times/24 hours and with feeding cues. Mom encouraged to feed baby w/feeding cuesWH/LC brochure given w/resources, support groups and LC services.Encouraged to call for assistance if needed and to verify proper latch.Mom reports + breast changes w/pregnancy.  Patient Name: Brittany Duwaine MaxinVinneshia Vaughan WUJWJ'XToday's Date: 09/04/2013     Maternal Data Infant to breast within first hour of birth: Yes  Feeding Feeding Type: Breast Fed Length of feed: 20 min  LATCH Score/Interventions Latch: Repeated attempts needed to sustain latch, nipple held in mouth throughout feeding, stimulation needed to elicit sucking reflex.  Audible Swallowing: None Intervention(s): Skin to skin  Type of Nipple: Everted at rest and after stimulation  Comfort (Breast/Nipple): Soft / non-tender     Hold (Positioning): No assistance needed to correctly position infant at breast.  LATCH Score: 7  Lactation Tools Discussed/Used     Consult Status      Ireland Chagnon, Diamond NickelLAURA G 09/04/2013, 2:44 AM

## 2013-09-04 NOTE — Progress Notes (Signed)
RN Doloris Hallebbie Nix observed father of children of patient giving the 23yo child [place] corn starch starch from a can. The mother told the father to "Quit giving the 23yo my starch". Colleen Child psychotherapistsocial worker notified.

## 2013-09-04 NOTE — Anesthesia Postprocedure Evaluation (Signed)
  Anesthesia Post-op Note  Patient: Brittany [name]  Procedure(s) Performed: * No procedures listed *  Patient Location: Mother/Baby  Anesthesia Type:Epidural  Level of Consciousness: awake  Airway and Oxygen Therapy: Patient Spontanous Breathing  Post-op Pain: none  Post-op Assessment: Patient's Cardiovascular Status Stable, Respiratory Function Stable, Patent Airway, No signs of Nausea or vomiting, Adequate PO intake, Pain level controlled, No headache, No backache, No residual numbness and No residual motor weakness  Post-op Vital Signs: Reviewed and stable  Last Vitals:  Filed Vitals:   09/04/13 0915  BP: 111/71  Pulse: 71  Temp: 36.7 C  Resp: 18    Complications: No apparent anesthesia complications

## 2013-09-04 NOTE — Lactation Note (Signed)
This note was copied from the [name]. Lactation Consultation Note  Patient Name: Brittany [name] OVFIE'PToday's Date: 09/04/2013 Reason for consult: Follow-up assessment Baby 16 hours of life. Mom is in process of latching baby. Baby latches well with wide gape, suckles rhythmically, only a few swallows noted. Mom states that she is able to hand express colostrum from both breasts. Mom had no issues breastfeeding her older children. Enc mom to call for assistance as needed.   Maternal Data    Feeding Feeding Type: Breast Fed  LATCH Score/Interventions Latch: Grasps breast easily, tongue down, lips flanged, rhythmical sucking. Intervention(s): Adjust position;Assist with latch;Breast massage;Breast compression  Audible Swallowing: A few with stimulation  Type of Nipple: Everted at rest and after stimulation  Comfort (Breast/Nipple): Soft / non-tender     Hold (Positioning): No assistance needed to correctly position infant at breast.  LATCH Score: 9  Lactation Tools Discussed/Used     Consult Status Consult Status: Follow-up Date: 09/05/13 Follow-up type: In-patient    [name], Sharifa Bucholz 09/04/2013, 4:01 PM
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
## 2013-09-05 DIAGNOSIS — IMO0001 Reserved for inherently not codable concepts without codable children: Secondary | ICD-10-CM

## 2013-09-05 MED ORDER — ALBUTEROL SULFATE (2.5 MG/3ML) 0.083% IN NEBU
2.5000 mg | INHALATION_SOLUTION | Freq: Once | RESPIRATORY_TRACT | Status: AC
  Administered 2013-09-05: 2.5 mg via RESPIRATORY_TRACT
  Filled 2013-09-05: qty 3

## 2013-09-05 NOTE — Plan of Care (Signed)
Problem: Discharge Progression Outcomes Goal: Barriers To Progression Addressed/Resolved Outcome: Progressing Hx- ppHemm- currently passing clots

## 2013-09-05 NOTE — Lactation Note (Signed)
This note was copied from the chart of [facility]. Lactation Consultation Note  Patient Name: Brittany [name] EAVWU'JToday's Date: 09/05/2013 Reason for consult: Follow-up assessment Baby 44 hours of life. Mom reports sore right nipple. Mom has a faint compression stripe on nipple. Enc mom to use comfort gels between feedings and to get a deeper latch while nursing. Mom declines LC assistance to obtain a deeper latch at this time because she is eating dinner and baby is sleeping. Enc mom to call out for assistance with latching as needed. Mom enc to use EBM on nipples after each feeding and allow to dry before using comfort gels.   Maternal Data    Feeding Feeding Type:  (Mom states baby nursed an hour ago.) Length of feed: 30 min  LATCH Score/Interventions Latch:  (Baby asleep, mom declines to latch baby at this time. Mom is eating dinner.)        Comfort (Breast/Nipple): Filling, red/small blisters or bruises, mild/mod discomfort (Mom has a compression stripe on right nipple. States it is moderately painful. Mom is using comfort gels. Enc mom to call for assistance with getting a deeper latch as needed. )  Problem noted: Mild/Moderate discomfort        Lactation Tools Discussed/Used Tools: Comfort gels   Consult Status Consult Status: Follow-up Date: 09/06/13 Follow-up type: In-patient    [name], [name] 09/05/2013, 8:32 PM

## 2013-09-05 NOTE — Progress Notes (Signed)
Post Partum Day 2 Subjective: Overnight patient with several episodes of heavy vaginal bleeding passed large clot yesterday afternoon.  She is presently not bleeding heavy. She is up ad lib, voiding, tolerating PO, + flatus and has no complaints of SOB, CP, dizziness. She was placed on a methergine series.  Objective: Blood pressure 130/83, pulse 97, temperature 98 F (36.7 C), temperature source Oral, resp. rate 18, height 5\' 4"  (1.626 m), weight 93.441 kg (206 lb), last menstrual period 12/03/2012, SpO2 100.00%, unknown if currently breastfeeding.  Physical Exam:  General: alert, cooperative and no distress Lochia: appropriate, minimal now Uterine Fundus: firm DVT Evaluation: No evidence of DVT seen on physical exam. Negative Homan's sign. No cords or calf tenderness.   Recent Labs  09/03/13 1804 09/04/13 0545  HGB 10.8* 10.2*  HCT 30.2* 28.8*    Assessment/Plan: Plan for discharge tomorrow due to her bleeding episode will closely monitor today She is Breastfeeding   LOS: 2 days   Brittany [name] 09/05/2013, 9:05 AM

## 2013-09-05 NOTE — Progress Notes (Signed)
MD (Dr. Fredia SorrowK. Ross) notified of pt's c/o shortness of breath, occ. exp wheeze noted. Order received to give an additional Neb now. RT notified.
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
## 2013-09-06 MED ORDER — ALBUTEROL SULFATE HFA 108 (90 BASE) MCG/ACT IN AERS: 2.0000 | INHALATION_SPRAY | Freq: Four times a day (QID) | RESPIRATORY_TRACT | Status: DC | PRN

## 2013-09-06 MED ORDER — MEDROXYPROGESTERONE ACETATE 150 MG/ML IM SUSP: 150.0000 mg | INTRAMUSCULAR | Status: DC

## 2013-09-06 MED ORDER — MEDROXYPROGESTERONE ACETATE 150 MG/ML IM SUSP
150.0000 mg | Freq: Once | INTRAMUSCULAR | Status: AC
  Administered 2013-09-06: 150 mg via INTRAMUSCULAR
  Filled 2013-09-06: qty 1

## 2013-09-06 NOTE — Discharge Summary (Signed)
Obstetric Discharge Summary Reason for Admission: induction of labor Prenatal Procedures: NST Intrapartum Procedures: spontaneous vaginal delivery Postpartum Procedures: none Complications-Operative and Postpartum: hemorrhage Hemoglobin  Date Value Ref Range Status  09/04/2013 10.2* 12.0 - 15.0 g/dL Final     HCT  Date Value Ref Range Status  09/04/2013 28.8* 36.0 - 46.0 % Final    Discharge Diagnoses: Term Pregnancy-delivered  Discharge Information: Date: 09/06/2013 Activity: pelvic rest Diet: routine Medications: Ibuprofen Condition: stable Instructions: refer to practice specific booklet Discharge to: home Follow-up Information   Follow up with Janna Oak A, MD.   Specialty:  Obstetrics and Gynecology   Contact information:   [address] [place] [address] [phone]       Newborn Data: Live born female  Birth Weight: 6 lb 14.8 oz (3141 g) APGAR: 9, 9  Home with mother.  Santresa Levett A 09/06/2013, 7:47 AM

## 2013-09-06 NOTE — Progress Notes (Signed)
Patient is eating, ambulating, voiding.  Pain control is good.  Filed Vitals:   09/04/13 1807 09/05/13 0545 09/05/13 1707 09/06/13 0525  BP: 122/87 130/83 128/81 131/80  Pulse: 69 97 92 94  Temp: 98.1 F (36.7 C) 98 F (36.7 C) 98.3 F (36.8 C) 98.8 F (37.1 C)  TempSrc: Oral Oral Oral Oral  Resp: 20 18 18 17   Height:      Weight:      SpO2:    99%    Fundus firm Perineum without swelling.  Lab Results  Component Value Date   WBC 15.5* 09/04/2013   HGB 10.2* 09/04/2013   HCT 28.8* 09/04/2013   MCV 79.8 09/04/2013   PLT 200 09/04/2013    --/--/O POS (07/20 1804)/RI  A/P Post partum day 3 after pp hemorrhage- pt stable.  Routine care.  Expect d/c today.    Brittany [name] A

## 2013-11-01 ENCOUNTER — Emergency Department (HOSPITAL_COMMUNITY)
Admission: EM | Admit: 2013-11-01 | Discharge: 2013-11-02 | Disposition: A | Discharge status: Home / Self Care | Payer: Medicaid Other | Attending: Emergency Medicine | Admitting: Emergency Medicine

## 2013-11-01 ENCOUNTER — Encounter (HOSPITAL_COMMUNITY): Payer: Self-pay | Admitting: Emergency Medicine

## 2013-11-01 DIAGNOSIS — T148 Other injury of unspecified body region: Secondary | ICD-10-CM | POA: Diagnosis not present

## 2013-11-01 DIAGNOSIS — Z79899 Other long term (current) drug therapy: Secondary | ICD-10-CM | POA: Diagnosis not present

## 2013-11-01 DIAGNOSIS — J45909 Unspecified asthma, uncomplicated: Secondary | ICD-10-CM | POA: Insufficient documentation

## 2013-11-01 DIAGNOSIS — Y9289 Other specified places as the place of occurrence of the external cause: Secondary | ICD-10-CM | POA: Insufficient documentation

## 2013-11-01 DIAGNOSIS — Y9389 Activity, other specified: Secondary | ICD-10-CM | POA: Insufficient documentation

## 2013-11-01 DIAGNOSIS — R21 Rash and other nonspecific skin eruption: Secondary | ICD-10-CM | POA: Insufficient documentation

## 2013-11-01 DIAGNOSIS — Z8632 Personal history of gestational diabetes: Secondary | ICD-10-CM | POA: Diagnosis not present

## 2013-11-01 DIAGNOSIS — Z87891 Personal history of nicotine dependence: Secondary | ICD-10-CM | POA: Diagnosis not present

## 2013-11-01 DIAGNOSIS — L089 Local infection of the skin and subcutaneous tissue, unspecified: Secondary | ICD-10-CM | POA: Insufficient documentation

## 2013-11-01 DIAGNOSIS — Z8614 Personal history of Methicillin resistant Staphylococcus aureus infection: Secondary | ICD-10-CM | POA: Insufficient documentation

## 2013-11-01 DIAGNOSIS — W57XXXA Bitten or stung by nonvenomous insect and other nonvenomous arthropods, initial encounter: Principal | ICD-10-CM

## 2013-11-01 DIAGNOSIS — L0390 Cellulitis, unspecified: Secondary | ICD-10-CM

## 2013-11-01 DIAGNOSIS — Z8619 Personal history of other infectious and parasitic diseases: Secondary | ICD-10-CM | POA: Diagnosis not present

## 2013-11-01 DIAGNOSIS — L0291 Cutaneous abscess, unspecified: Secondary | ICD-10-CM

## 2013-11-01 NOTE — ED Notes (Signed)
Pt states she has a rash that she has had for  3 to 4 days  Pt states it is on her arms and legs  Pt states the rash was not red to start with but is red now   Pt states the rash is itchy   Pt states today she woke up around 3pm and had bites on her left hand and right arm  Pt states she thinks it might have been a spider but she did not see one

## 2013-11-01 NOTE — ED Notes (Signed)
Pt also has a red raised area to her right forearm

## 2013-11-01 NOTE — ED Provider Notes (Signed)
CSN: [id number]     Arrival date & time 11/01/13  2208 History  This chart was scribed for non-physician practitioner working with Derwood Kaplan, MD, by Roxy Cedar ED Scribe. This patient was seen in room WTR6/WTR6 and the patient's care was started at 11:47 PM  Chief Complaint  Patient presents with  . Insect Bite  . Rash   The history is provided by the patient. No language interpreter was used.    HPI Comments: Brittany [name] is a 23 y.o. female who presents to the Emergency Department complaining of an insect bite located on her left hand at the knuckle of her pinky finger that she noticed today and a rash on her lower arms bilaterally that began 3 days ago. Patient also has a large bump on her right lower arm. Patient is suspicious that she was bit by a spider. Patient is not currently breastfeeding. Patient states the rash is tender to touch. Denies systemic symptoms.  Past Medical History  Diagnosis Date  . Asthma   . NVD (normal vaginal delivery) 08/30/2010  . Gestational diabetes     G2  . Hx MRSA infection 2005  . Trichimoniasis   . Chlamydia   . Gonorrhea    Past Surgical History  Procedure Laterality Date  . No past surgeries    . Multiple tooth extractions     Family History  Problem Relation Age of Onset  . Anesthesia problems Neg Hx   . Other Neg Hx   . Diabetes Maternal Grandmother   . Hypertension Father   . Heart disease Father   . Lung disease Father    History  Substance Use Topics  . Smoking status: Former Smoker -- 0.25 packs/day for 5 years    Types: Cigarettes    Quit date: 12/27/2012  . Smokeless tobacco: Never Used  . Alcohol Use: No   OB History   Grav Para Term Preterm Abortions TAB SAB Ect Mult Living   Review of Systems  Skin: Positive for rash and wound (insect bite).  All other systems reviewed and are negative.  Allergies  Iodine and Sulfonamide derivatives  Home Medications   Prior to  Admission medications   Medication Sig Start Date End Date Taking? Authorizing Provider  albuterol (PROVENTIL HFA;VENTOLIN HFA) 108 (90 BASE) MCG/ACT inhaler Inhale 2 puffs into the lungs every 6 (six) hours as needed for wheezing or shortness of breath.    Historical Provider, MD  albuterol (PROVENTIL HFA;VENTOLIN HFA) 108 (90 BASE) MCG/ACT inhaler Inhale 2 puffs into the lungs every 6 (six) hours as needed for wheezing or shortness of breath. 09/06/13   Loney Laurence, MD  cephALEXin (KEFLEX) 500 MG capsule Take 1 capsule (500 mg total) by mouth 4 (four) times daily. 11/02/13   Denise Bramblett Irine Seal, PA-C  cyclobenzaprine (FLEXERIL) 10 MG tablet Take 10 mg by mouth 3 (three) times daily as needed for muscle spasms.    Historical Provider, MD  medroxyPROGESTERone (DEPO-PROVERA) 150 MG/ML injection Inject 1 mL (150 mg total) into the muscle every 3 (three) months. 09/06/13   Loney Laurence, MD  Prenatal Vit-Fe Fumarate-FA (PRENATAL MULTIVITAMIN) TABS tablet Take 1 tablet by mouth daily.     Historical Provider, MD  traMADol (ULTRAM) 50 MG tablet Take 1 tablet (50 mg total) by mouth every 6 (six) hours as needed. 11/02/13   Dorthula Matas, PA-C   Triage Vitals: BP  128/76  Pulse 85  Temp(Src) 98.3 F (36.8 C) (Oral)  Resp 20  SpO2 100%  Physical Exam  Nursing note and vitals reviewed. Constitutional: She appears well-developed and well-nourished.  HENT:  Head: Normocephalic and atraumatic.  Eyes: Conjunctivae are normal. Right eye exhibits no discharge. Left eye exhibits no discharge.  Pulmonary/Chest: Effort normal. No respiratory distress.  Neurological: She is alert. Coordination normal.  Skin: Skin is warm and dry. No rash noted. She is not diaphoretic. No erythema.  Multiple insect bites to pts bilateral arms that are exocoriated. Small amount of induration and erythema surrounding bites. No fluctuance or crepitus. FROM of all upper extremity joints and physiologic radial pulses   Psychiatric: She has a normal mood and affect.    ED Course  Procedures (including critical care time)  DIAGNOSTIC STUDIES: Oxygen Saturation is 100% on RA, normal by my interpretation.    COORDINATION OF CARE: 11:58 PM- Will give antibiotic medication to prevent further infection. Pt advised of plan for treatment and pt agrees.  Labs Review Labs Reviewed - No data to display  Imaging Review No results found.   EKG Interpretation None     MDM   Final diagnoses:  Abscess and cellulitis   Patient has significant amount of insect bites to arms that appear to be inflamed due to excoriation with secondary cellulitis. Will Rx keflex/bactrim. Topical hydrocortisone cream. She needs to follow-up with PCP for blood work if symptoms do not resolve. Told to return to the ED if red flag symptoms present.  22 y.o.Brittany [name]'s evaluation in the Emergency Department is complete. It has been determined that no acute conditions requiring further emergency intervention are present at this time. The patient/guardian have been advised of the diagnosis and plan. We have discussed signs and symptoms that warrant return to the ED, such as changes or worsening in symptoms.  Vital signs are stable at discharge. Filed Vitals:   11/01/13 2257  BP: 128/76  Pulse: 85  Temp: 98.3 F (36.8 C)  Resp: 20    Patient/guardian has voiced understanding and agreed to follow-up with the PCP or specialist.   I personally performed the services described in this documentation, which was scribed in my presence. The recorded information has been reviewed and is accurate.   Dorthula Matas, PA-C 11/02/13 0015  Dorthula Matas, PA-C 11/02/13 [id number]

## 2013-11-02 MED ORDER — ONDANSETRON 4 MG PO TBDP
4.0000 mg | ORAL_TABLET | Freq: Once | ORAL | Status: AC
  Administered 2013-11-02: 4 mg via ORAL
  Filled 2013-11-02: qty 1

## 2013-11-02 MED ORDER — TRAMADOL HCL 50 MG PO TABS: 50.0000 mg | ORAL_TABLET | Freq: Four times a day (QID) | ORAL | Status: DC | PRN

## 2013-11-02 MED ORDER — CEPHALEXIN 250 MG PO CAPS
250.0000 mg | ORAL_CAPSULE | Freq: Once | ORAL | Status: AC
  Administered 2013-11-02: 250 mg via ORAL
  Filled 2013-11-02: qty 1

## 2013-11-02 MED ORDER — HYDROCODONE-ACETAMINOPHEN 5-325 MG PO TABS
1.0000 | ORAL_TABLET | Freq: Once | ORAL | Status: AC
  Administered 2013-11-02: 1 via ORAL
  Filled 2013-11-02: qty 1

## 2013-11-02 MED ORDER — CEPHALEXIN 500 MG PO CAPS: 500.0000 mg | ORAL_CAPSULE | Freq: Four times a day (QID) | ORAL | Status: DC

## 2013-11-02 NOTE — Discharge Instructions (Signed)

## 2013-11-02 NOTE — ED Provider Notes (Signed)
Medical screening examination/treatment/procedure(s) were performed by non-physician practitioner and as supervising physician I was immediately available for consultation/collaboration.   EKG Interpretation None       Derwood Kaplan, MD 11/02/13 [phone]

## 2013-12-10 ENCOUNTER — Emergency Department (HOSPITAL_COMMUNITY)
Admission: EM | Admit: 2013-12-10 | Discharge: 2013-12-10 | Disposition: A | Discharge status: Home / Self Care | Payer: Medicaid Other | Attending: Emergency Medicine | Admitting: Emergency Medicine

## 2013-12-10 ENCOUNTER — Encounter (HOSPITAL_COMMUNITY): Payer: Self-pay | Admitting: Emergency Medicine

## 2013-12-10 ENCOUNTER — Emergency Department (HOSPITAL_COMMUNITY): Payer: Medicaid Other

## 2013-12-10 DIAGNOSIS — Z8614 Personal history of Methicillin resistant Staphylococcus aureus infection: Secondary | ICD-10-CM | POA: Diagnosis not present

## 2013-12-10 DIAGNOSIS — Z793 Long term (current) use of hormonal contraceptives: Secondary | ICD-10-CM | POA: Insufficient documentation

## 2013-12-10 DIAGNOSIS — Z8619 Personal history of other infectious and parasitic diseases: Secondary | ICD-10-CM | POA: Insufficient documentation

## 2013-12-10 DIAGNOSIS — Z79899 Other long term (current) drug therapy: Secondary | ICD-10-CM | POA: Diagnosis not present

## 2013-12-10 DIAGNOSIS — Z8632 Personal history of gestational diabetes: Secondary | ICD-10-CM | POA: Insufficient documentation

## 2013-12-10 DIAGNOSIS — R0600 Dyspnea, unspecified: Secondary | ICD-10-CM

## 2013-12-10 DIAGNOSIS — J45901 Unspecified asthma with (acute) exacerbation: Secondary | ICD-10-CM | POA: Diagnosis not present

## 2013-12-10 DIAGNOSIS — Z87891 Personal history of nicotine dependence: Secondary | ICD-10-CM | POA: Diagnosis not present

## 2013-12-10 DIAGNOSIS — Z7951 Long term (current) use of inhaled steroids: Secondary | ICD-10-CM | POA: Diagnosis not present

## 2013-12-10 DIAGNOSIS — E669 Obesity, unspecified: Secondary | ICD-10-CM | POA: Diagnosis not present

## 2013-12-10 IMAGING — CR DG CHEST 2V
2 series · 2 of 2 positions shown · non-contrast
Comparison: [DATE].

CLINICAL DATA: Dyspnea for 2 days.  History of asthma.

EXAM:
CHEST  2 VIEW

[w chest pa]
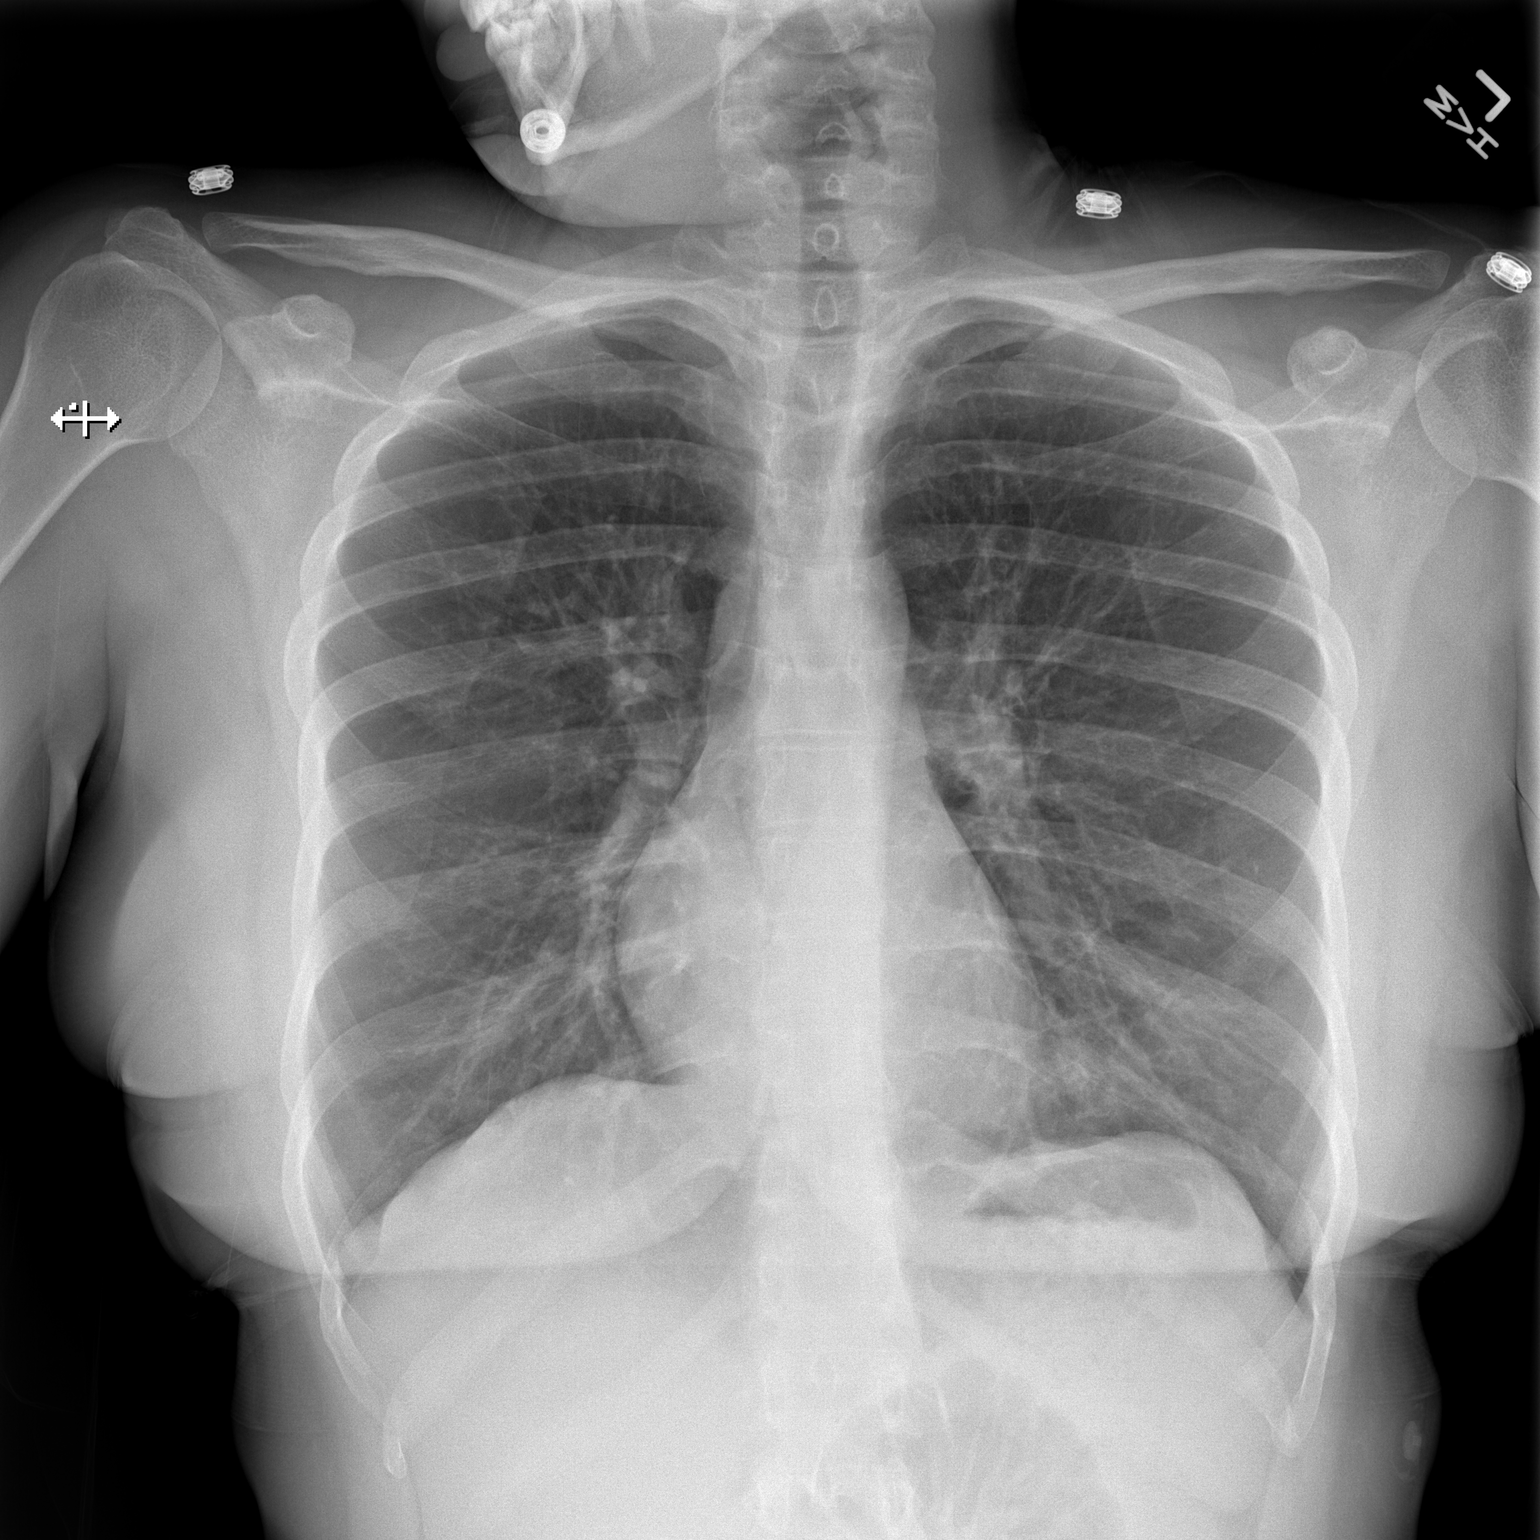

[w chest lat]
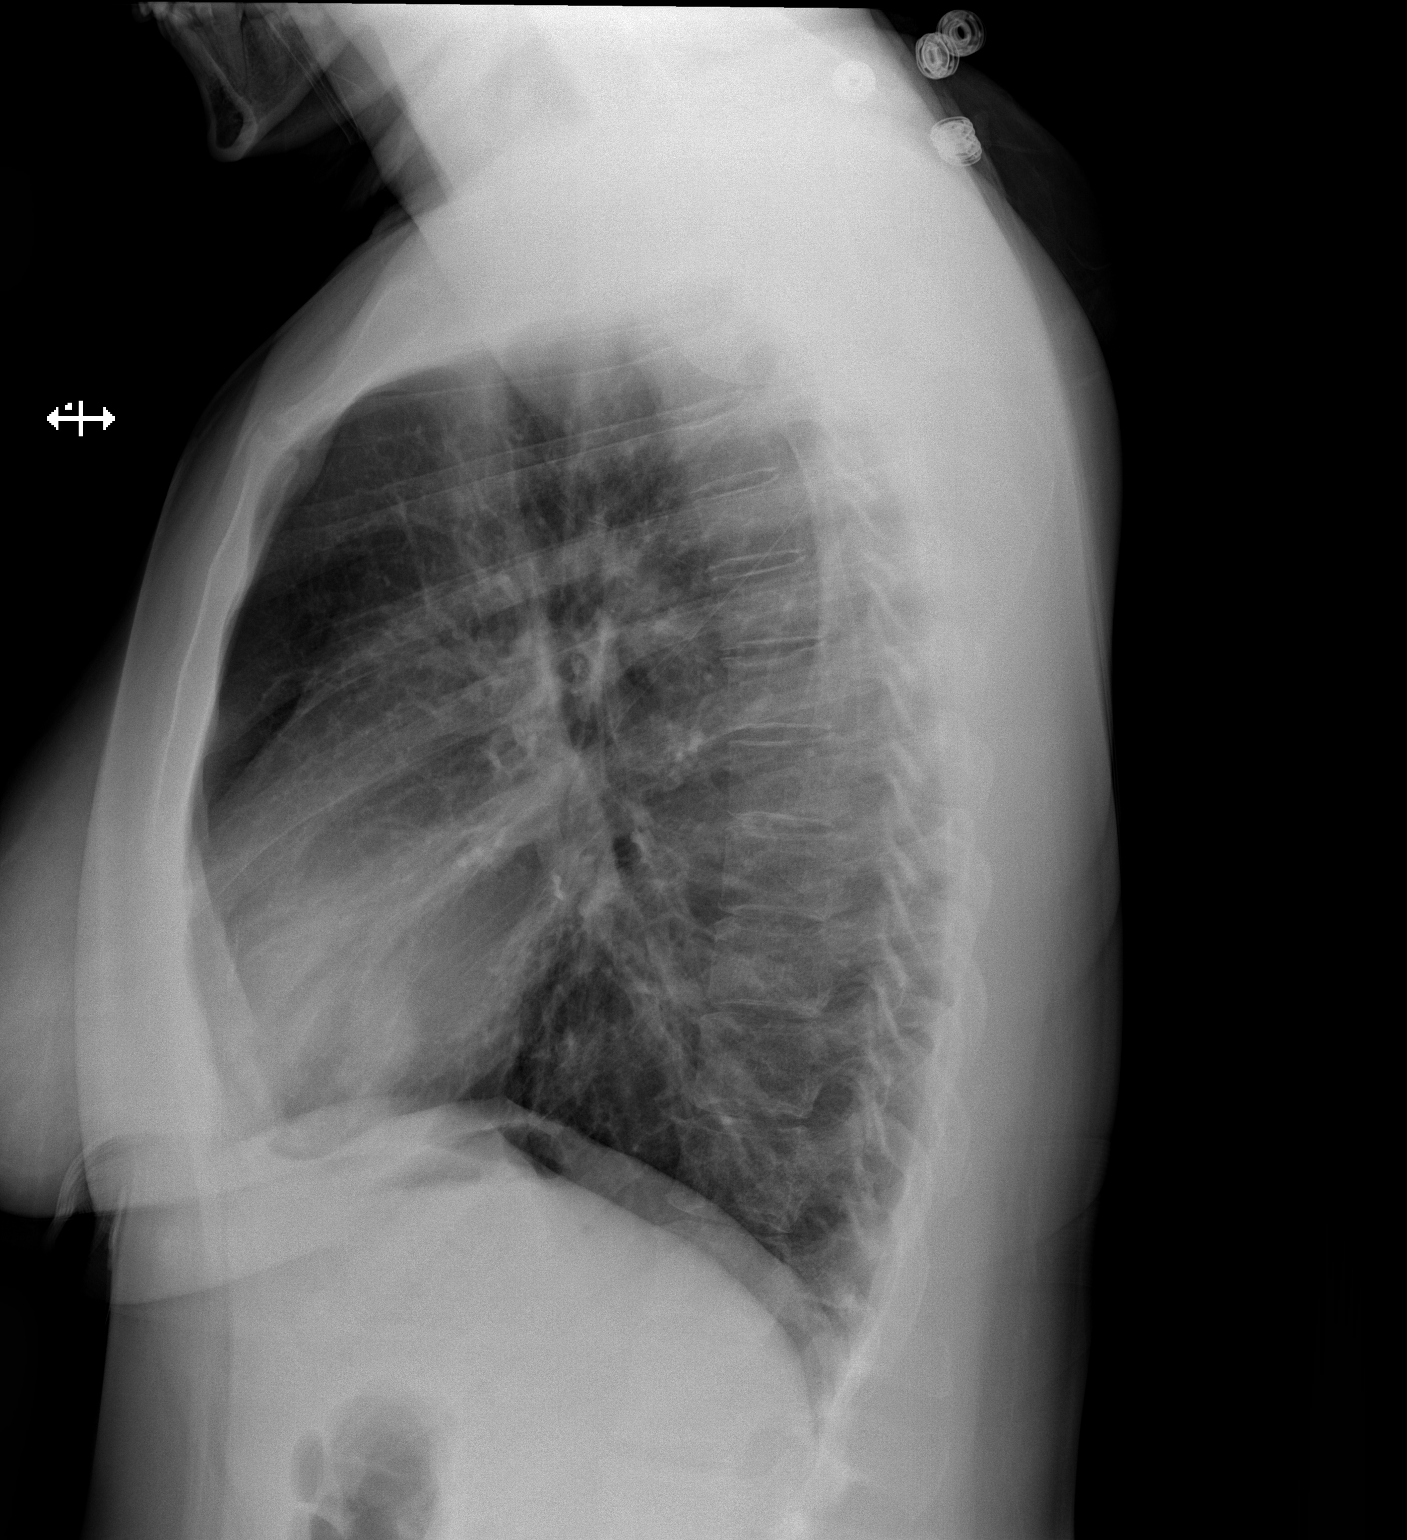

[2 of 2 positions shown; findings below may reference images not displayed]

FINDINGS: Normal heart, mediastinum hila. Lungs are hyperexpanded but clear.
No pleural effusion or pneumothorax.

Bony thorax is unremarkable.
IMPRESSION: Hyperexpanded but clear lungs.  No other abnormality.

## 2013-12-10 MED ORDER — ALBUTEROL SULFATE HFA 108 (90 BASE) MCG/ACT IN AERS
2.0000 | INHALATION_SPRAY | Freq: Once | RESPIRATORY_TRACT | Status: AC
Start: 2013-12-10 — End: 2013-12-10
  Administered 2013-12-10: 2 via RESPIRATORY_TRACT
  Filled 2013-12-10: qty 6.7

## 2013-12-10 MED ORDER — IPRATROPIUM BROMIDE 0.02 % IN SOLN
0.5000 mg | Freq: Once | RESPIRATORY_TRACT | Status: AC
  Administered 2013-12-10: 0.5 mg via RESPIRATORY_TRACT
  Filled 2013-12-10: qty 2.5

## 2013-12-10 MED ORDER — PREDNISONE 20 MG PO TABS
60.0000 mg | ORAL_TABLET | Freq: Once | ORAL | Status: AC
  Administered 2013-12-10: 60 mg via ORAL
  Filled 2013-12-10: qty 3

## 2013-12-10 MED ORDER — PREDNISONE 20 MG PO TABS
40.0000 mg | ORAL_TABLET | Freq: Every day | ORAL | Status: DC
Start: 2013-12-10 — End: 2014-03-21

## 2013-12-10 MED ORDER — AEROCHAMBER Z-STAT PLUS/MEDIUM MISC
1.0000 | Freq: Once | Status: AC
  Administered 2013-12-10: 1

## 2013-12-10 MED ORDER — PREDNISONE 20 MG PO TABS
10.0000 mg | ORAL_TABLET | Freq: Every day | ORAL | Status: DC
  Filled 2013-12-10: qty 1

## 2013-12-10 MED ORDER — ALBUTEROL (5 MG/ML) CONTINUOUS INHALATION SOLN
15.0000 mg | INHALATION_SOLUTION | Freq: Once | RESPIRATORY_TRACT | Status: AC
  Administered 2013-12-10: 15 mg via RESPIRATORY_TRACT
  Filled 2013-12-10: qty 20

## 2013-12-10 NOTE — Discharge Instructions (Signed)
Use the Resource Guide to get a primary care doctor to see for ongoing management of your asthma.   Asthma Asthma is a recurring condition in which the airways tighten and narrow. Asthma can make it difficult to breathe. It can cause coughing, wheezing, and shortness of breath. Asthma episodes, also called asthma attacks, range from minor to life-threatening. Asthma cannot be cured, but medicines and lifestyle changes can help control it. CAUSES Asthma is believed to be caused by inherited (genetic) and environmental factors, but its exact cause is unknown. Asthma may be triggered by allergens, lung infections, or irritants in the air. Asthma triggers are different for each person. Common triggers include:   Animal dander.  Dust mites.  Cockroaches.  Pollen from trees or grass.  Mold.  Smoke.  Air pollutants such as dust, household cleaners, hair sprays, aerosol sprays, paint fumes, strong chemicals, or strong odors.  Cold air, weather changes, and winds (which increase molds and pollens in the air).  Strong emotional expressions such as crying or laughing hard.  Stress.  Certain medicines (such as aspirin) or types of drugs (such as beta-blockers).  Sulfites in foods and drinks. Foods and drinks that may contain sulfites include dried fruit, potato chips, and sparkling grape juice.  Infections or inflammatory conditions such as the flu, a cold, or an inflammation of the nasal membranes (rhinitis).  Gastroesophageal reflux disease (GERD).  Exercise or strenuous activity. SYMPTOMS Symptoms may occur immediately after asthma is triggered or many hours later. Symptoms include:  Wheezing.  Excessive nighttime or early morning coughing.  Frequent or severe coughing with a common cold.  Chest tightness.  Shortness of breath. DIAGNOSIS  The diagnosis of asthma is made by a review of your medical history and a physical exam. Tests may also be performed. These may  include:  Lung function studies. These tests show how much air you breathe in and out.  Allergy tests.  Imaging tests such as X-rays. TREATMENT  Asthma cannot be cured, but it can usually be controlled. Treatment involves identifying and avoiding your asthma triggers. It also involves medicines. There are 2 classes of medicine used for asthma treatment:   Controller medicines. These prevent asthma symptoms from occurring. They are usually taken every day.  Reliever or rescue medicines. These quickly relieve asthma symptoms. They are used as needed and provide short-term relief. Your health care provider will help you create an asthma action plan. An asthma action plan is a written plan for managing and treating your asthma attacks. It includes a list of your asthma triggers and how they may be avoided. It also includes information on when medicines should be taken and when their dosage should be changed. An action plan may also involve the use of a device called a peak flow meter. A peak flow meter measures how well the lungs are working. It helps you monitor your condition. HOME CARE INSTRUCTIONS   Take medicines only as directed by your health care provider. Speak with your health care provider if you have questions about how or when to take the medicines.  Use a peak flow meter as directed by your health care provider. Record and keep track of readings.  Understand and use the action plan to help minimize or stop an asthma attack without needing to seek medical care.  Control your home environment in the following ways to help prevent asthma attacks:  Do not smoke. Avoid being exposed to secondhand smoke.  Change your heating and air conditioning  filter regularly.  Limit your use of fireplaces and wood stoves.  Get rid of pests (such as roaches and mice) and their droppings.  Throw away plants if you see mold on them.  Clean your floors and dust regularly. Use unscented cleaning  products.  Try to have someone else vacuum for you regularly. Stay out of rooms while they are being vacuumed and for a short while afterward. If you vacuum, use a dust mask from a hardware store, a double-layered or microfilter vacuum cleaner bag, or a vacuum cleaner with a HEPA filter.  Replace carpet with wood, tile, or vinyl flooring. Carpet can trap dander and dust.  Use allergy-proof pillows, mattress covers, and box spring covers.  Wash bed sheets and blankets every week in hot water and dry them in a dryer.  Use blankets that are made of polyester or cotton.  Clean bathrooms and kitchens with bleach. If possible, have someone repaint the walls in these rooms with mold-resistant paint. Keep out of the rooms that are being cleaned and painted.  Wash hands frequently. SEEK MEDICAL CARE IF:   You have wheezing, shortness of breath, or a cough even if taking medicine to prevent attacks.  The colored mucus you cough up (sputum) is thicker than usual.  Your sputum changes from clear or white to yellow, green, gray, or bloody.  You have any problems that may be related to the medicines you are taking (such as a rash, itching, swelling, or trouble breathing).  You are using a reliever medicine more than 2-3 times per week.  Your peak flow is still at 50-79% of your personal best after following your action plan for 1 hour.  You have a fever. SEEK IMMEDIATE MEDICAL CARE IF:   You seem to be getting worse and are unresponsive to treatment during an asthma attack.  You are short of breath even at rest.  You get short of breath when doing very little physical activity.  You have difficulty eating, drinking, or talking due to asthma symptoms.  You develop chest pain.  You develop a fast heartbeat.  You have a bluish color to your lips or fingernails.  You are light-headed, dizzy, or faint.  Your peak flow is less than 50% of your personal best. MAKE SURE YOU:   Understand  these instructions.  Will watch your condition.  Will get help right away if you are not doing well or get worse. Document Released: 02/01/2005 Document Revised: 06/18/2013 Document Reviewed: 08/31/2012 [facility] Patient Information 2015 [place], [place]. This information is not intended to replace advice given to you by your health care provider. Make sure you discuss any questions you have with your health care provider.  Bronchospasm A bronchospasm is when the tubes that carry air in and out of your lungs (airways) spasm or tighten. During a bronchospasm it is hard to breathe. This is because the airways get smaller. A bronchospasm can be triggered by:  Allergies. These may be to animals, pollen, food, or mold.  Infection. This is a common cause of bronchospasm.  Exercise.  Irritants. These include pollution, cigarette smoke, strong odors, aerosol sprays, and paint fumes.  Weather changes.  Stress.  Being emotional. HOME CARE   Always have a plan for getting help. Know when to call your doctor and local emergency services (911 in the U.S[place] Know where you can get emergency care.  Only take medicines as told by your doctor.  If you were prescribed an inhaler or nebulizer machine,  ask your doctor how to use it correctly. Always use a spacer with your inhaler if you were given one.  Stay calm during an attack. Try to relax and breathe more slowly.  Control your home environment:  Change your heating and air conditioning filter at least once a month.  Limit your use of fireplaces and wood stoves.  Do not  smoke. Do not  allow smoking in your home.  Avoid perfumes and fragrances.  Get rid of pests (such as roaches and mice) and their droppings.  Throw away plants if you see mold on them.  Keep your house clean and dust free.  Replace carpet with wood, tile, or vinyl flooring. Carpet can trap dander and dust.  Use allergy-proof pillows, mattress covers, and box spring  covers.  Wash bed sheets and blankets every week in hot water. Dry them in a dryer.  Use blankets that are made of polyester or cotton.  Wash hands frequently. GET HELP IF:  You have muscle aches.  You have chest pain.  The thick spit you spit or cough up (sputum) changes from clear or white to yellow, green, gray, or bloody.  The thick spit you spit or cough up gets thicker.  There are problems that may be related to the medicine you are given such as:  A rash.  Itching.  Swelling.  Trouble breathing. GET HELP RIGHT AWAY IF:  You feel you cannot breathe or catch your breath.  You cannot stop coughing.  Your treatment is not helping you breathe better.  You have very bad chest pain. MAKE SURE YOU:   Understand these instructions.  Will watch your condition.  Will get help right away if you are not doing well or get worse. Document Released: 11/29/2008 Document Revised: 02/06/2013 Document Reviewed: 07/25/2012 [facility] Patient Information 2015 [place]Care, [place]. This information is not intended to replace advice given to you by your health care provider. Make sure you discuss any questions you have with your health care provider.    Emergency Department Resource Guide 1) Find a Doctor and Pay Out of Pocket Although you won't have to find out who is covered by your insurance plan, it is a good idea to ask around and get recommendations. You will then need to call the office and see if the doctor you have chosen will accept you as a new patient and what types of options they offer for patients who are self-pay. Some doctors offer discounts or will set up payment plans for their patients who do not have insurance, but you will need to ask so you aren't surprised when you get to your appointment.  2) Contact Your Local Health Department Not all health departments have doctors that can see patients for sick visits, but many do, so it is worth a call to see if yours does.  If you don't know where your local health department is, you can check in your phone book. The CDC also has a tool to help you locate your state's health department, and many state websites also have listings of all of their local health departments.  3) Find a Walk-in Clinic If your illness is not likely to be very severe or complicated, you may want to try a walk in clinic. These are popping up all over the country in pharmacies, drugstores, and shopping centers. They're usually staffed by nurse practitioners or physician assistants that have been trained to treat common illnesses and complaints. They're usually fairly quick and inexpensive.  However, if you have serious medical issues or chronic medical problems, these are probably not your best option.  No Primary Care Doctor: - Call Health Connect at  [phone] - they can help you locate a primary care doctor that  accepts your insurance, provides certain services, etc. - Physician Referral Service- [phone]  Chronic Pain Problems: Organization         Address  Phone   Notes  [facility]  [phone] Patients need to be referred by their primary care doctor.   Medication Assistance: Organization         Address  Phone   Notes  [facility] [address] [place] [place], [place] [address] [phone] --Must be a resident of [facility] -- Must have NO insurance coverage whatsoever (no Medicaid/ Medicare, etc.) -- The pt. MUST have a primary care doctor that directs their care regularly and follows them in the community   [facility]  [phone]   [employer]  [phone]    Agencies that provide inexpensive medical care: Organization         Address  Phone   Notes  [facility]  [phone]   [facility]    [phone]   [facility] [address] [place], [place] [address] [phone]   [facility] [place] [address] [place] [place] [phone]   Planned Parenthood    [phone]   [facility]    [phone]   [facility] and [facility]  [address], [place] Phone:  [phone], Fax:  [phone] Hours of Operation:  9 am - 6 pm, M-F.  Also accepts Medicaid/Medicare and self-pay.  [facility]  [address], [place] Phone: [phone], Fax: [phone]. Hours of Operation:  8:30 am - 5:30 pm, M-F.  Also accepts Medicaid and self-pay.  [facility] [place] [address], [place] Phone: [phone]   [facility] [address] Natasha Bence [place], [place] [phone], Ext. [phone] Mondays & Thursdays: 7-9 AM.  First 15 patients are seen on a first come, first serve basis.    Medicaid-accepting [facility] Providers:  Organization         Address  Phone   Notes  [facility] [address], [place] [phone] Also accepts self-pay patients.  [facility] [address] Laurell Josephs [place], [place]  [phone]   [facility] [address], [place] [phone]   [facility] [address] [place] [phone]   Renaye Rakers [address], [place]   [phone] Only accepts [place] Access [place] patients after they have their name applied to their card.   Self-Pay (no insurance) in [facility]:  Organization         Address  Phone   Notes  Sickle Cell Patients, [facility] [address] [place], [place] [phone]   [facility] [address] [place], [place] [phone]   [facility]  [address], [place] [phone]   [facility]/Dr. Osei-Bonsu  [address], [place] or [address], [place] [phone]  Phone number for both [place] and [place] locations is the same.  [facility] and [facility] [address], [place] [phone]   [facility] [address], [place] or [address] [phone]   [facility] [address], [place] [phone], phone; [phone], fax Sees patients 1st and 3rd Saturday of every month.  Must not qualify for public or private insurance (i.e. Medicaid, Medicare, [place] Health Choice, Veterans' Benefits)  Household income should be no more than 200% of the poverty level The clinic cannot treat you if you are pregnant or think you are pregnant  Sexually transmitted diseases are not treated at the clinic.    Dental Care: Organization         Address  Phone  Notes  [facility] [address] [place], [place] [phone] Accepts children up to age 31 who are enrolled in [place] or [place] Health Choice; pregnant women with a Medicaid card; and children who have applied for Medicaid or [place] Health Choice, but were declined, whose parents can pay a reduced fee at time of service.  [facility]  [address], [place] [phone] Accepts children up to age 38 who are enrolled in [place] or [place] Health Choice; pregnant women with a Medicaid card; and children who have applied for Medicaid or [place] Health Choice, but were declined, whose parents can pay a reduced fee at time of service.  [facility]  [address] [place], [place] [phone] Patients are seen by appointment only. Walk-ins are not accepted. [facility] will see patients 11 years of age and older. Monday - Tuesday (8am-5pm) Most Wednesdays (8:30-5pm) $30 per visit, cash only  [facility]  [address], [facility] [phone] Patients are seen by appointment  only. Walk-ins are not accepted. [facility] will see patients 72 years of age and older. One Wednesday Evening (Monthly: Volunteer Based).  $30 per visit, cash only  [employer]  [phone] for adults; Children under age 57, call Graduate Pediatric Dentistry at [phone]. Children aged 32-14, please call [phone] to request a pediatric application.  Dental services are provided in all areas of dental care including fillings, crowns and bridges, complete and partial dentures, implants, gum treatment, root canals, and extractions. Preventive care is also provided. Treatment is provided to both adults and children. Patients are selected via a lottery and there is often a waiting list.   [facility] [address], [place]  [phone] [email or web address]   [facility] [address] [place], [place] [phone], Ext. [phone] Second and Fourth Thursday of each month, opens at 6:30 AM; Clinic ends at 9 AM.  Patients are seen on a first-come first-served basis, and a limited number are seen during each clinic.   [facility]  [address] Ether Griffins [place], [place] [phone]   Eligibility Requirements You must have lived in [place], [place], or [place] counties for at least the last three months.   You cannot be eligible for state or federal sponsored [place], including [employer], [place], or [employer].   You generally cannot be eligible for healthcare insurance through your employer.    How to apply: Eligibility screenings are held every Tuesday and Wednesday afternoon from 1:00 pm until 4:00 pm. You do  not need an appointment for the interview!  [facility] [address] [place], [place] [phone]   [facility] Health Department  [phone]   [facility] Health Department  [phone]   [facility] Health Department  [phone]    Behavioral Health  Resources in the Community: Intensive Outpatient Programs Organization         Address  Phone  Notes  [facility] Services [address] [place], [place] [phone]   [facility] Outpatient [address], [place], [place] [phone]   [facility] [address] [place], [place]  [phone]   [facility] [address]  [place], [place] [phone] or [phone]   Substance Abuse Resources Organization         Address  Phone  Notes  [facility]  [phone]   [facility]  [phone]   [facility]  [phone]   Floydene Flock  [phone]   Residential & Outpatient Substance Abuse Program  [phone]   Psychological Services Organization         Address  Phone  Notes  [facility] Behavioral Health  [phone]   [facility] Services  [phone]   [facility] Mental [facility] [address] [place] [phone] or [phone]    Mobile Crisis Teams Organization         Address  Phone  Notes  [facility], Mobile Crisis Care Unit  [phone]   [facility]  [address] [place], [place] [phone]   Doristine Locks [address] [place] [phone]    Self-Help/Support Groups Organization         Address  Phone             Notes  [facility] [place] - variety of support groups  336- [phone] Call for more information  Narcotics Anonymous (NA), [facility] [address], [employer] [place]  2 meetings at this location   Statistician         Address  Phone  Notes  [facility] [address],    [place]  [phone]   [facility]  [address], [place] [address], [place], [place] [phone]   [facility] [address] [place], [place] [phone] Admissions: 8am-3pm M-F  [facility] [address]    [place], [place] [phone]   [facility] [address] [place], [place], [place] [phone]   [facility] [address]  [place], [place] [phone]   [facility] - Intensive Outpatient [address] Laurell Josephs [address], [place], [place] [phone]   [facility] ([facility] [address] [place]  [place], [place] [phone] or [phone]   [facility] ([facility]) [address] [place], [place] [phone] Accepts Medicaid  [facility] [place] [address]  [place] [phone] Substance Abuse/Addiction Treatment   [facility] Organization         Address  Phone  Notes  [facility]  [phone]   Angie Fava, PhD [address] Ervin Knack [place], [place]   [phone] or [phone]   [facility] Behavioral   [address] [place], [place] [phone]   [facility] [address] [place], [place] [phone] Insurance/Medicaid/sponsorship through [employer] and Families [address]  [place], [place] [phone] [place], [place] [phone]    Dr. Adele Schilder  [phone]   [facility] Dept. 1) [address], [place] 2) [place] 3)  [place] [address], [place] [phone]  [phone]   [place] [phone] or [phone] (After Hours)

## 2013-12-10 NOTE — ED Notes (Signed)
Pt states hx of asthma.  States that she has been having cough and SOB since yesterday.  Has been using inhalers and nebs since yesterday and nothing is working.  Pt is able to finish sentences but appears out of breath at the end of the sentence.

## 2013-12-12 NOTE — ED Provider Notes (Signed)
CSN: [id number]     Arrival date & time 12/10/13  1147 History   First MD Initiated Contact with Patient 12/10/13 1216     Chief Complaint  Patient presents with  . Asthma  . Cough  . Shortness of Breath     (Consider location/radiation/quality/duration/timing/severity/associated sxs/prior Treatment) Patient is a 23 y.o. female presenting with asthma, cough, and shortness of breath. The history is provided by the patient.  Asthma Associated symptoms include shortness of breath.  Cough Associated symptoms: shortness of breath   Shortness of Breath Associated symptoms: cough    She c/o SOB, Cough and ashiness for 1 day. No fever weakness or dizziness. No recent similar problems. Using inhaler and Nebulizer at home without relief. She is an ex-smoker. Cough is present, nonproductive. No fever/chills.There are no other known modifying factors.  Past Medical History  Diagnosis Date  . Asthma   . NVD (normal vaginal delivery) 08/30/2010  . Gestational diabetes     G2  . Hx MRSA infection 2005  . Trichimoniasis   . Chlamydia   . Gonorrhea    Past Surgical History  Procedure Laterality Date  . No past surgeries    . Multiple tooth extractions     Family History  Problem Relation Age of Onset  . Anesthesia problems Neg Hx   . Other Neg Hx   . Diabetes Maternal Grandmother   . Hypertension Father   . Heart disease Father   . Lung disease Father    History  Substance Use Topics  . Smoking status: Former Smoker -- 0.25 packs/day for 5 years    Types: Cigarettes    Quit date: 12/27/2012  . Smokeless tobacco: Never Used  . Alcohol Use: No   OB History   Grav Para Term Preterm Abortions TAB SAB Ect Mult Living   4 4 4       4      Review of Systems  Respiratory: Positive for cough and shortness of breath.   All other systems reviewed and are negative.     Allergies  Iodine and Sulfonamide derivatives  Home Medications   Prior to Admission medications    Medication Sig Start Date End Date Taking? Authorizing Provider  albuterol (PROVENTIL HFA;VENTOLIN HFA) 108 (90 BASE) MCG/ACT inhaler Inhale 2 puffs into the lungs every 6 (six) hours as needed for wheezing or shortness of breath (wheezing & shortness of breath).    Yes Historical Provider, MD  cyclobenzaprine (FLEXERIL) 10 MG tablet Take 10 mg by mouth 3 (three) times daily as needed for muscle spasms (muscle spasms).    Yes Historical Provider, MD  medroxyPROGESTERone (DEPO-PROVERA) 150 MG/ML injection Inject 1 mL (150 mg total) into the muscle every 3 (three) months. 09/06/13   Loney LaurenceMichelle A Horvath, MD  predniSONE (DELTASONE) 20 MG tablet Take 2 tablets (40 mg total) by mouth daily. 12/10/13   Raeford RazorStephen Kohut, MD   BP 144/91  Pulse 121  Temp(Src) 98.6 F (37 C) (Oral)  Resp 16  SpO2 98%  LMP 12/03/2013 Physical Exam  Nursing note and vitals reviewed. Constitutional: She is oriented to person, place, and time. She appears well-developed.  Obese  HENT:  Head: Normocephalic and atraumatic.  Right Ear: External ear normal.  Left Ear: External ear normal.  Eyes: Conjunctivae and EOM are normal. Pupils are equal, round, and reactive to light.  Neck: Normal range of motion and phonation normal. Neck supple.  Cardiovascular: Normal rate, regular rhythm and normal heart sounds.   Pulmonary/Chest:  She is in respiratory distress (tachypneic). She has wheezes (inspiratory, generalized). She has no rales. She exhibits no tenderness and no bony tenderness.  Somewhat decreased air mvt.  Abdominal: Soft. There is no tenderness.  Musculoskeletal: Normal range of motion.  Neurological: She is alert and oriented to person, place, and time. No cranial nerve deficit or sensory deficit. She exhibits normal muscle tone. Coordination normal.  Skin: Skin is warm, dry and intact.  Psychiatric: She has a normal mood and affect. Her behavior is normal. Judgment and thought content normal.    ED Course   Procedures (including critical care time)  Albuterol CAT ordered for respiratory distress  Medications  ipratropium (ATROVENT) nebulizer solution 0.5 mg (0.5 mg Nebulization Given 12/10/13 1239)  albuterol (PROVENTIL,VENTOLIN) solution continuous neb (15 mg Nebulization Given 12/10/13 1239)  predniSONE (DELTASONE) tablet 60 mg (60 mg Oral Given 12/10/13 1252)  albuterol (PROVENTIL HFA;VENTOLIN HFA) 108 (90 BASE) MCG/ACT inhaler 2 puff (2 puffs Inhalation Rx Charged 12/10/13 1638)  aerochamber Z-Stat Plus/medium 1 each (1 each Other Rx Charged 12/10/13 1638)    No data found.   At D/C Reevaluation with update and discussion. After initial assessment and treatment, an updated evaluation reveals she is more comfortable. Findings discusses with patient. Jacoria Keiffer L   CRITICAL CARE Performed by: Mancel BaleWENTZ,Joleena Weisenburger L Total critical care time: 30 minutes Critical care time was exclusive of separately billable procedures and treating other patients. Critical care was necessary to treat or prevent imminent or life-threatening deterioration. Critical care was time spent personally by me on the following activities: development of treatment plan with patient and/or surrogate as well as nursing, discussions with consultants, evaluation of patient's response to treatment, examination of patient, obtaining history from patient or surrogate, ordering and performing treatments and interventions, ordering and review of laboratory studies, ordering and review of radiographic studies, pulse oximetry and re-evaluation of patient's condition.  Labs Review Labs Reviewed - No data to display  Imaging Review Dg Chest 2 View  12/10/2013   CLINICAL DATA:  Dyspnea for 2 days.  History of asthma.  EXAM: CHEST  2 VIEW  COMPARISON:  01/11/2012.  FINDINGS: Normal heart, mediastinum hila. Lungs are hyperexpanded but clear. No pleural effusion or pneumothorax.  Bony thorax is unremarkable.  IMPRESSION: Hyperexpanded  but clear lungs.  No other abnormality.   Electronically Signed   By: Amie Portlandavid  Ormond M.D.   On: 12/10/2013 16:01     EKG Interpretation   Date/Time:  Monday December 10 2013 11:59:21 EDT Ventricular Rate:  130 PR Interval:  147 QRS Duration: 70 QT Interval:  291 QTC Calculation: 428 R Axis:   65 Text Interpretation:  Sinus tachycardia Consider right atrial enlargement  Borderline T abnormalities, inferior leads No old tracing to compare  Confirmed by [facility] Institute For RehabilitationWENTZ  MD, Teodor Prater [phone]([id number]) on 12/10/2013 12:17:38 PM      MDM   Final diagnoses:  Dyspnea  Asthmatic bronchitis with acute exacerbation   Eval. C/w exacerbation of asthma. Doubt PE, PNE or SBI.  Nursing Notes Reviewed/ Care Coordinated Applicable Imaging Reviewed Interpretation of Laboratory Data incorporated into ED treatment  The patient appears reasonably screened and/or stabilized for discharge and I doubt any other medical condition or other [facility] requiring further screening, evaluation, or treatment in the ED at this time prior to discharge.  Plan: Home Medications- Albuterol, Prednisone; Home Treatments- rest; return here if the recommended treatment, does not improve the symptoms; Recommended follow up- PCP prn    Flint MelterElliott L Kayden Hutmacher, MD 12/12/13 1153

## 2013-12-17 ENCOUNTER — Encounter (HOSPITAL_COMMUNITY): Payer: Self-pay | Admitting: Emergency Medicine

## 2013-12-21 ENCOUNTER — Emergency Department (HOSPITAL_COMMUNITY)
Admission: EM | Admit: 2013-12-21 | Discharge: 2013-12-21 | Disposition: A | Discharge status: Home / Self Care | Payer: Medicaid Other | Attending: Emergency Medicine | Admitting: Emergency Medicine

## 2013-12-21 ENCOUNTER — Encounter (HOSPITAL_COMMUNITY): Payer: Self-pay | Admitting: Emergency Medicine

## 2013-12-21 ENCOUNTER — Emergency Department (HOSPITAL_COMMUNITY): Payer: Medicaid Other

## 2013-12-21 DIAGNOSIS — J45901 Unspecified asthma with (acute) exacerbation: Secondary | ICD-10-CM | POA: Diagnosis not present

## 2013-12-21 DIAGNOSIS — Z8614 Personal history of Methicillin resistant Staphylococcus aureus infection: Secondary | ICD-10-CM | POA: Diagnosis not present

## 2013-12-21 DIAGNOSIS — Z87891 Personal history of nicotine dependence: Secondary | ICD-10-CM | POA: Diagnosis not present

## 2013-12-21 DIAGNOSIS — G933 Postviral fatigue syndrome: Secondary | ICD-10-CM | POA: Insufficient documentation

## 2013-12-21 DIAGNOSIS — L03113 Cellulitis of right upper limb: Secondary | ICD-10-CM | POA: Insufficient documentation

## 2013-12-21 DIAGNOSIS — Z8632 Personal history of gestational diabetes: Secondary | ICD-10-CM | POA: Diagnosis not present

## 2013-12-21 DIAGNOSIS — R05 Cough: Secondary | ICD-10-CM | POA: Diagnosis not present

## 2013-12-21 DIAGNOSIS — R059 Cough, unspecified: Secondary | ICD-10-CM

## 2013-12-21 DIAGNOSIS — Z8619 Personal history of other infectious and parasitic diseases: Secondary | ICD-10-CM | POA: Insufficient documentation

## 2013-12-21 DIAGNOSIS — R058 Other specified cough: Secondary | ICD-10-CM

## 2013-12-21 DIAGNOSIS — M7989 Other specified soft tissue disorders: Secondary | ICD-10-CM | POA: Diagnosis present

## 2013-12-21 LAB — CBC WITH DIFFERENTIAL/PLATELET
BASOS PCT: 0 % (ref 0–1)
Basophils Absolute: 0 10*3/uL (ref 0.0–0.1)
EOS PCT: 12 % — AB (ref 0–5)
Eosinophils Absolute: 1 10*3/uL — ABNORMAL HIGH (ref 0.0–0.7)
HCT: 32.6 % — ABNORMAL LOW (ref 36.0–46.0)
Hemoglobin: 11.1 g/dL — ABNORMAL LOW (ref 12.0–15.0)
Lymphocytes Relative: 34 % (ref 12–46)
Lymphs Abs: 2.9 10*3/uL (ref 0.7–4.0)
MCH: 25.6 pg — AB (ref 26.0–34.0)
MCHC: 34 g/dL (ref 30.0–36.0)
MCV: 75.1 fL — AB (ref 78.0–100.0)
Monocytes Absolute: 0.5 10*3/uL (ref 0.1–1.0)
Monocytes Relative: 6 % (ref 3–12)
NEUTROS PCT: 48 % (ref 43–77)
Neutro Abs: 4.2 10*3/uL (ref 1.7–7.7)
Platelets: 328 10*3/uL (ref 150–400)
RBC: 4.34 MIL/uL (ref 3.87–5.11)
RDW: 15 % (ref 11.5–15.5)
WBC: 8.6 10*3/uL (ref 4.0–10.5)

## 2013-12-21 LAB — BASIC METABOLIC PANEL
Anion gap: 11 (ref 5–15)
BUN: 10 mg/dL (ref 6–23)
CO2: 24 mEq/L (ref 19–32)
Calcium: 9 mg/dL (ref 8.4–10.5)
Chloride: 109 mEq/L (ref 96–112)
Creatinine, Ser: 0.83 mg/dL (ref 0.50–1.10)
GFR calc Af Amer: >90 mL/min (ref >90)
Glucose, Bld: 93 mg/dL (ref 70–99)
POTASSIUM: 4.1 meq/L (ref 3.7–5.3)
SODIUM: 144 meq/L (ref 137–147)

## 2013-12-21 IMAGING — CR DG CHEST 2V
2 series · 2 of 2 positions shown · non-contrast
Comparison: [DATE]; [DATE]

CLINICAL DATA: Right arm pain starting yesterday. History of
bronchitis. Cough. Initial encounter.

EXAM:
CHEST  2 VIEW

[w chest pa]
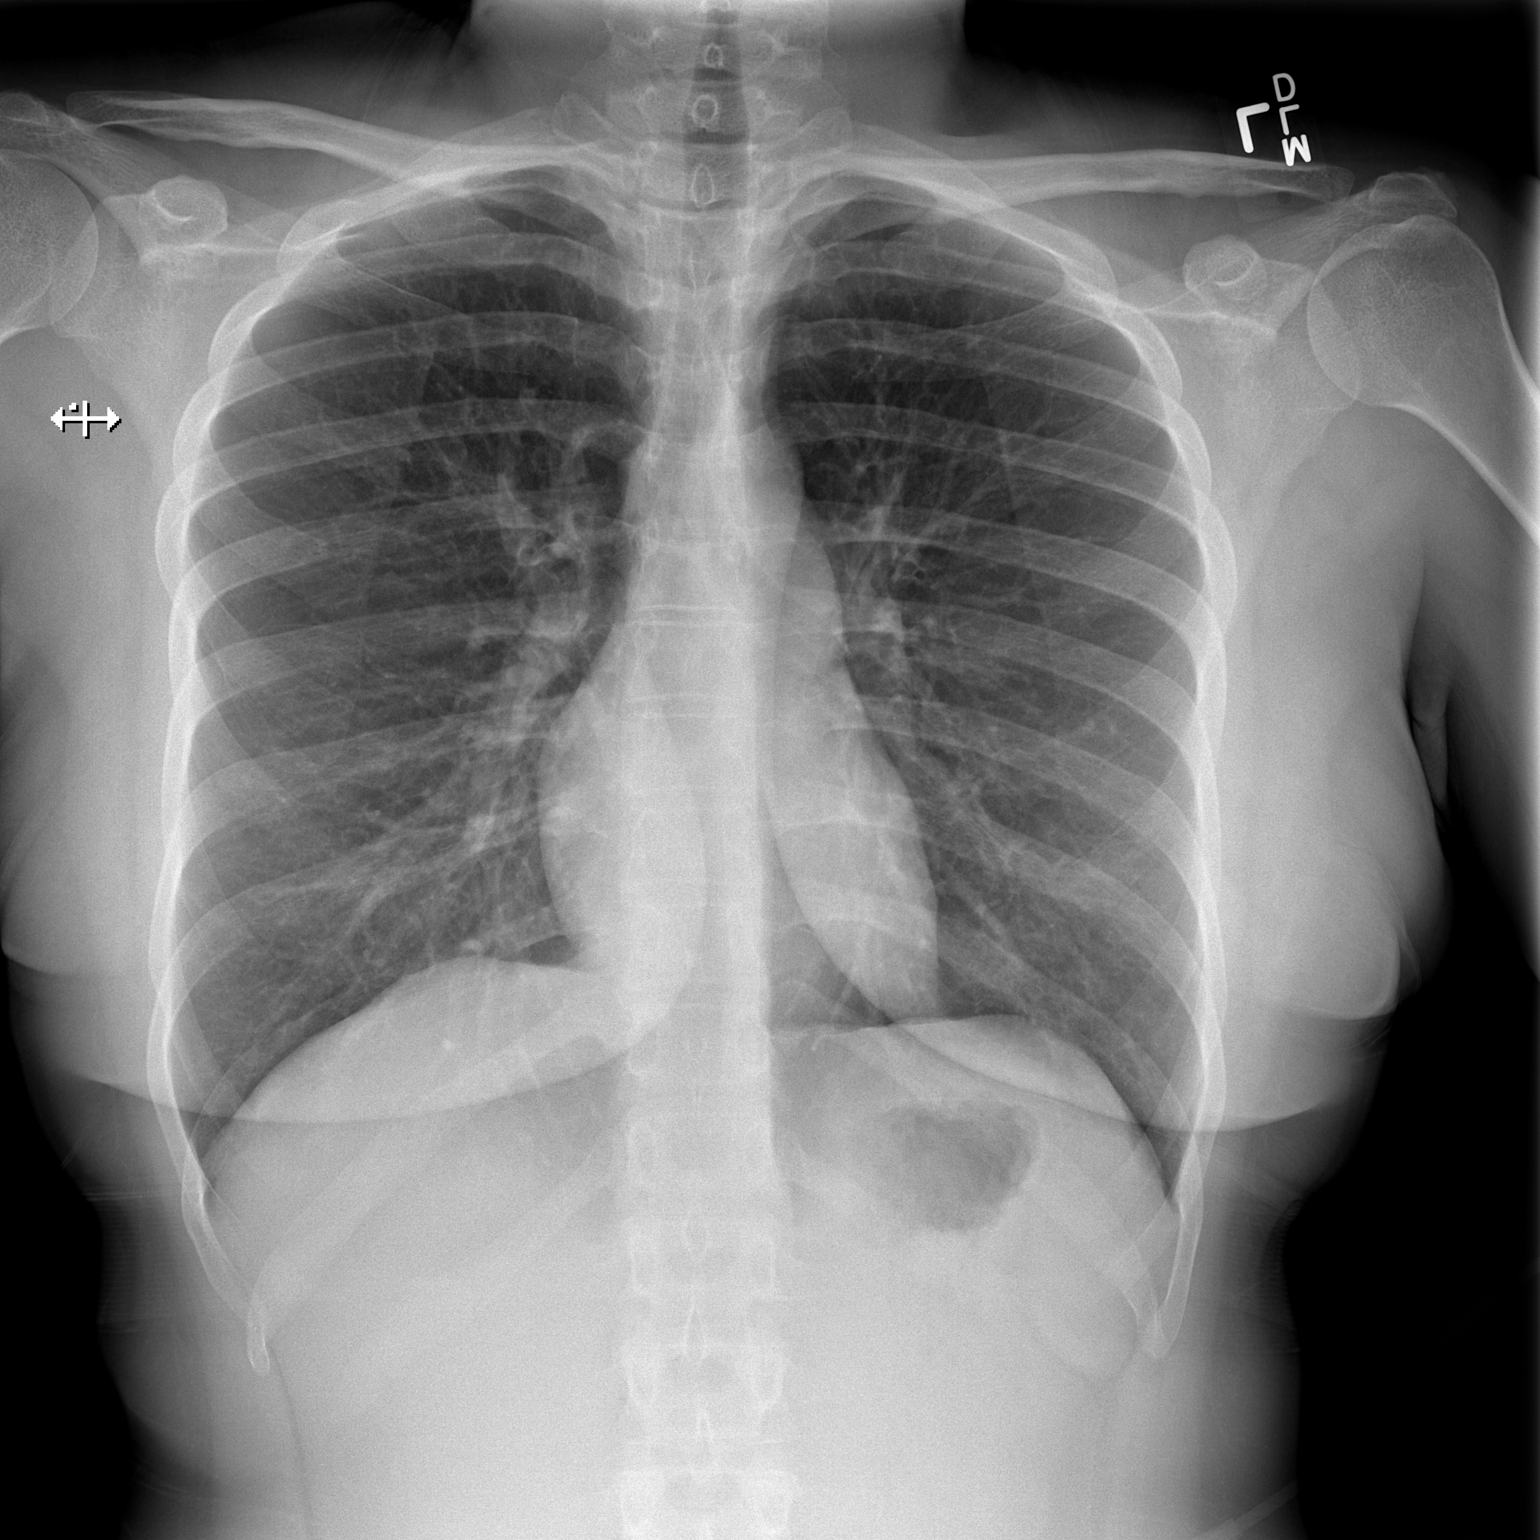

[w chest lat]
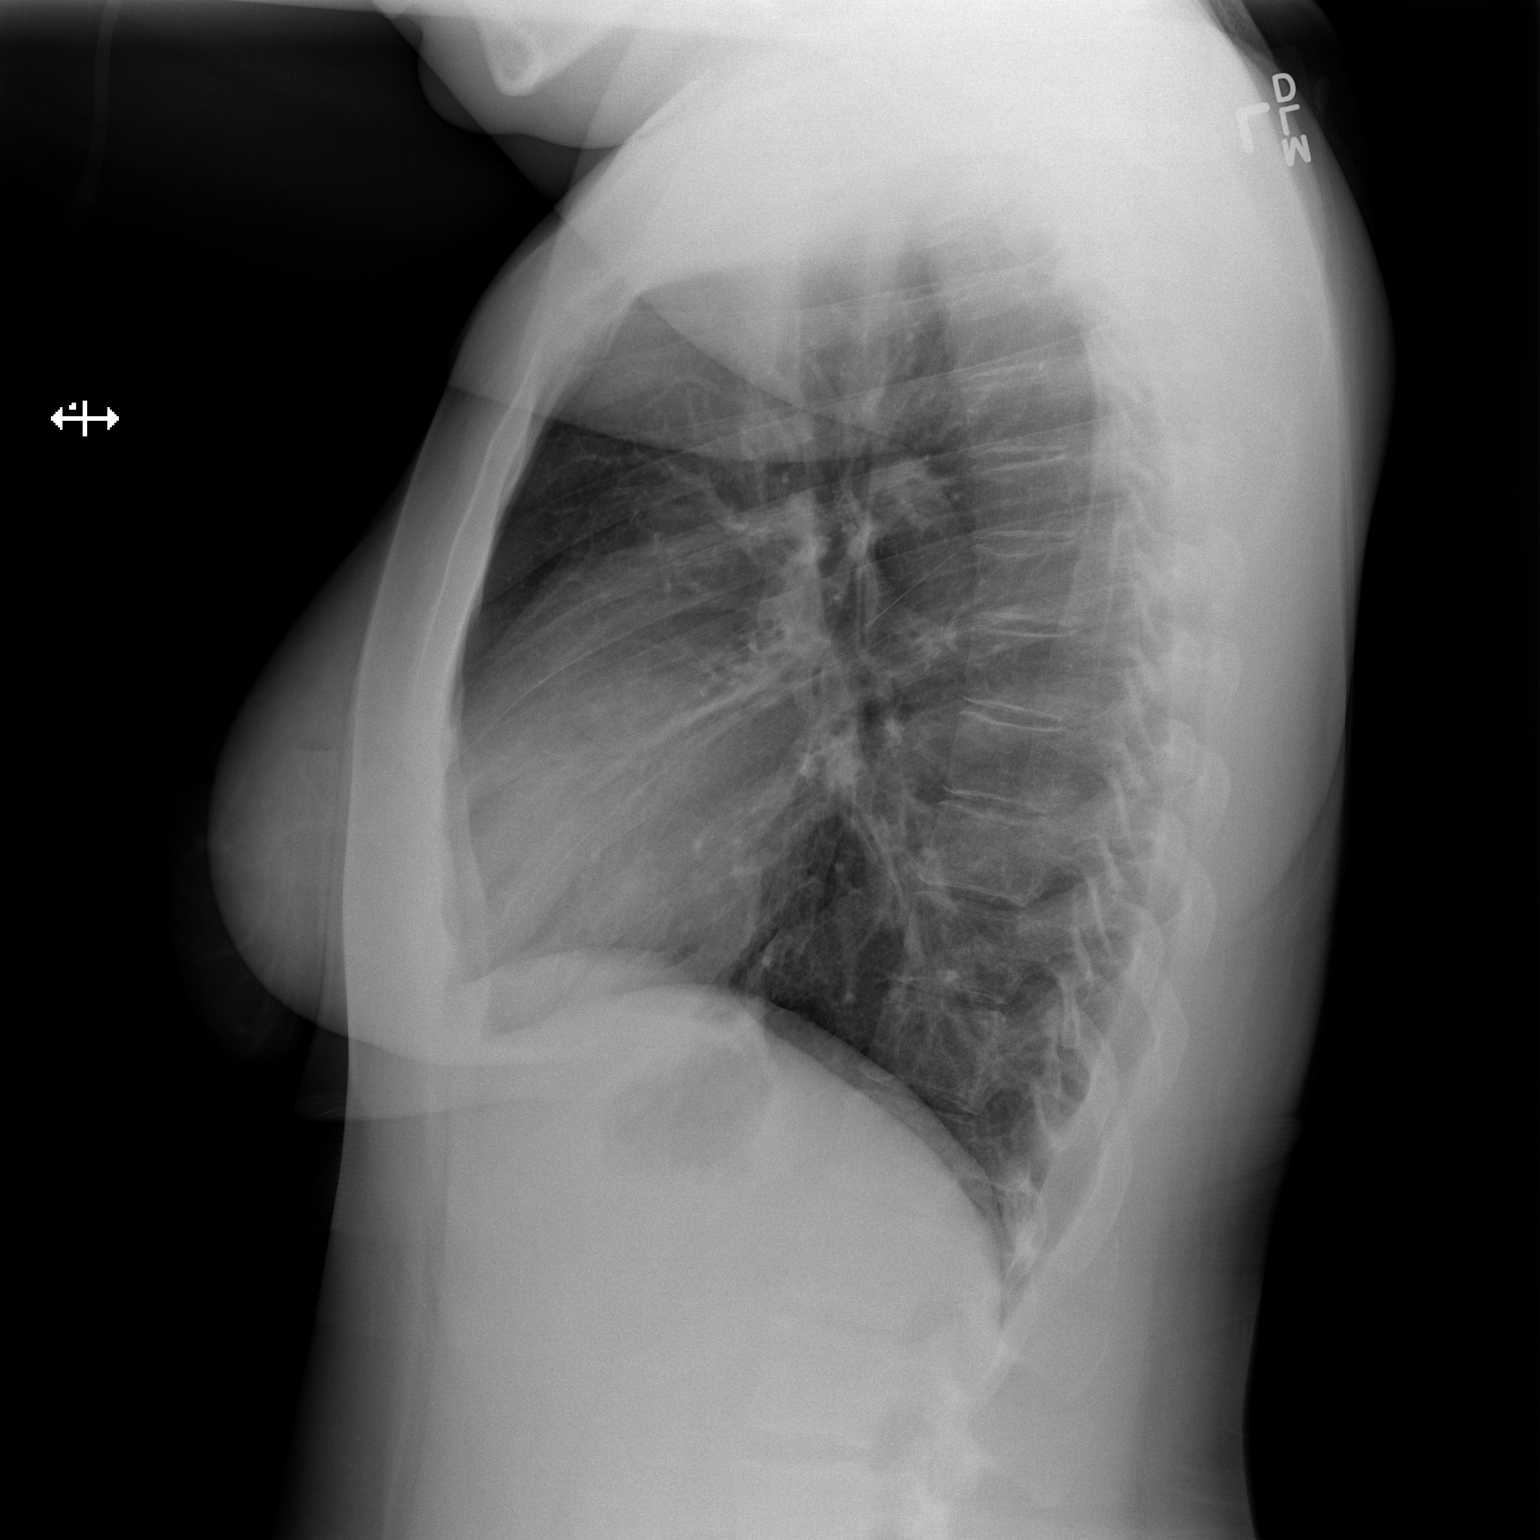

[2 of 2 positions shown; findings below may reference images not displayed]

FINDINGS: Normal cardiac silhouette and mediastinal contours. No focal
parenchymal opacities. No pleural effusion or pneumothorax. No
evidence of edema. No acute osseus abnormalities.
IMPRESSION: No acute cardiopulmonary disease.

## 2013-12-21 MED ORDER — DIPHENHYDRAMINE HCL 25 MG PO CAPS
25.0000 mg | ORAL_CAPSULE | Freq: Once | ORAL | Status: AC
  Administered 2013-12-21: 25 mg via ORAL
  Filled 2013-12-21: qty 1

## 2013-12-21 MED ORDER — NAPROXEN 500 MG PO TABS: 500.0000 mg | ORAL_TABLET | Freq: Two times a day (BID) | ORAL | Status: DC | PRN

## 2013-12-21 MED ORDER — IPRATROPIUM BROMIDE 0.02 % IN SOLN
0.5000 mg | Freq: Once | RESPIRATORY_TRACT | Status: AC
  Administered 2013-12-21: 0.5 mg via RESPIRATORY_TRACT
  Filled 2013-12-21: qty 2.5

## 2013-12-21 MED ORDER — HYDROCODONE-ACETAMINOPHEN 5-325 MG PO TABS: 1.0000 | ORAL_TABLET | Freq: Four times a day (QID) | ORAL | Status: DC | PRN

## 2013-12-21 MED ORDER — HYDROXYZINE HCL 25 MG PO TABS: 25.0000 mg | ORAL_TABLET | Freq: Four times a day (QID) | ORAL | Status: DC | PRN

## 2013-12-21 MED ORDER — ALBUTEROL SULFATE (2.5 MG/3ML) 0.083% IN NEBU
5.0000 mg | INHALATION_SOLUTION | Freq: Once | RESPIRATORY_TRACT | Status: AC
  Administered 2013-12-21: 5 mg via RESPIRATORY_TRACT
  Filled 2013-12-21: qty 6

## 2013-12-21 MED ORDER — ALBUTEROL SULFATE HFA 108 (90 BASE) MCG/ACT IN AERS
2.0000 | INHALATION_SPRAY | Freq: Once | RESPIRATORY_TRACT | Status: AC
  Administered 2013-12-21: 2 via RESPIRATORY_TRACT
  Filled 2013-12-21: qty 6.7

## 2013-12-21 MED ORDER — HYDROCODONE-ACETAMINOPHEN 5-325 MG PO TABS
1.0000 | ORAL_TABLET | Freq: Once | ORAL | Status: AC
  Administered 2013-12-21: 1 via ORAL
  Filled 2013-12-21: qty 1

## 2013-12-21 MED ORDER — CLINDAMYCIN PHOSPHATE 600 MG/50ML IV SOLN
600.0000 mg | Freq: Once | INTRAVENOUS | Status: AC
  Administered 2013-12-21: 600 mg via INTRAVENOUS
  Filled 2013-12-21: qty 50

## 2013-12-21 MED ORDER — CLINDAMYCIN HCL 300 MG PO CAPS: 300.0000 mg | ORAL_CAPSULE | Freq: Four times a day (QID) | ORAL | Status: DC

## 2013-12-21 NOTE — ED Provider Notes (Signed)
CSN: [id number]     Arrival date & time 12/21/13  1555 History   First MD Initiated Contact with Patient 12/21/13 1628     Chief Complaint  Patient presents with  . Arm Swelling     (Consider location/radiation/quality/duration/timing/severity/associated sxs/prior Treatment) HPI Comments: Brittany [name] is a 23 y.o. female with a PMHx of asthma and MRSA, who presents to the ED with complaints of RUE pain and erythema x1 day and swelling and warmth beginning today. Reports the pain is 10/10 constant throbbing, located in the areas of redness in her forearm, nonradiating, worse with pressure to the area, and unrelieved by tramadol and warm soaks. Associated symptoms include chills, pain with movement of skin of arm (although denies that elbow joint is painful), and some tingling intermittently. States last month she had a similar issue but that it was an allergic reaction to new soaps, and she scratched the rash she had which caused several small healing scabs. Denies any new insect bites or rashes, denies new wounds. Denies fevers, red streaking, drainage, itching, numbness, weakness, joint swelling or pain, abd pain, n/v/d/c, urinary or vaginal symptoms, recent travel/immobilization/surgery, recent IV, hx of DVT/PE, OCP use, hemoptysis, LE swelling, CP, SOB, DOE, or claudication. She also reports having bronchitis last week, for which she was given prednisone and inhalers. Overall her symptoms have improved but she's continuing to report productive cough with yellowish sputum especially at night with SOB when she coughs very hard, feels like she's unable to cough up all the phlegm she has in her chest. Endorses wheezing which improves with inhaler use. States she's tried "all the OTC medications" without relief. Denies rhinorrhea, sinus congestion, ear or eye symptoms, LAD, or sore throat.   Patient is a 23 y.o. female presenting with extremity pain. The history is provided by the patient. No  language interpreter was used.  Extremity Pain This is a new problem. The current episode started yesterday. The problem occurs constantly. The problem has been gradually worsening. Associated symptoms include chills, coughing (improving), joint swelling (RUE), myalgias (RUE) and a rash. Pertinent negatives include no abdominal pain, arthralgias, change in bowel habit, chest pain, congestion, fever, headaches, nausea, neck pain, numbness, sore throat, urinary symptoms, vomiting or weakness. Exacerbated by: pressure to skin, movement of skin. She has tried heat and oral narcotics for the symptoms. The treatment provided no relief.    Past Medical History  Diagnosis Date  . Asthma   . NVD (normal vaginal delivery) 08/30/2010  . Gestational diabetes     G2  . Hx MRSA infection 2005  . Trichimoniasis   . Chlamydia   . Gonorrhea    Past Surgical History  Procedure Laterality Date  . No past surgeries    . Multiple tooth extractions     Family History  Problem Relation Age of Onset  . Anesthesia problems Neg Hx   . Other Neg Hx   . Diabetes Maternal Grandmother   . Hypertension Father   . Heart disease Father   . Lung disease Father    History  Substance Use Topics  . Smoking status: Former Smoker -- 0.25 packs/day for 5 years    Types: Cigarettes    Quit date: 12/27/2012  . Smokeless tobacco: Never Used  . Alcohol Use: No   OB History    Gravida Para Term Preterm AB TAB SAB Ectopic Multiple Living   4 4 4       4      Review of Systems  Constitutional: Positive for chills. Negative for fever.  HENT: Negative for congestion, ear discharge, ear pain, rhinorrhea, sinus pressure and sore throat.   Eyes: Negative for pain, discharge and itching.  Respiratory: Positive for cough (improving) and wheezing (intermittent). Negative for chest tightness and shortness of breath.   Cardiovascular: Negative for chest pain and leg swelling.  Gastrointestinal: Negative for nausea, vomiting,  abdominal pain, diarrhea, constipation and change in bowel habit.  Genitourinary: Negative for dysuria and hematuria.  Musculoskeletal: Positive for myalgias (RUE) and joint swelling (RUE). Negative for back pain, arthralgias and neck pain.  Skin: Positive for color change and rash.  Neurological: Negative for dizziness, syncope, weakness, light-headedness, numbness and headaches.  Hematological: Negative for adenopathy.   10 Systems reviewed and are negative for acute change except as noted in the HPI.    Allergies  Iodine and Sulfonamide derivatives  Home Medications   Prior to Admission medications   Medication Sig Start Date End Date Taking? Authorizing Provider  albuterol (PROVENTIL HFA;VENTOLIN HFA) 108 (90 BASE) MCG/ACT inhaler Inhale 2 puffs into the lungs every 6 (six) hours as needed for wheezing or shortness of breath (wheezing & shortness of breath).     Historical Provider, MD  cyclobenzaprine (FLEXERIL) 10 MG tablet Take 10 mg by mouth 3 (three) times daily as needed for muscle spasms (muscle spasms).     Historical Provider, MD  medroxyPROGESTERone (DEPO-PROVERA) 150 MG/ML injection Inject 1 mL (150 mg total) into the muscle every 3 (three) months. 09/06/13   Loney Laurence, MD  predniSONE (DELTASONE) 20 MG tablet Take 2 tablets (40 mg total) by mouth daily. 12/10/13   Raeford Razor, MD   BP 134/85 mmHg  Pulse 84  Temp(Src) 98.4 F (36.9 C) (Oral)  Resp 18  SpO2 99%  LMP 12/03/2013 Physical Exam  Constitutional: She is oriented to person, place, and time. Vital signs are normal. She appears well-developed and well-nourished.  Non-toxic appearance. No distress.  Afebrile, nontoxic, NAD  HENT:  Head: Normocephalic and atraumatic.  Nose: Nose normal.  Mouth/Throat: Oropharynx is clear and moist and mucous membranes are normal.  Nose clear Oropharynx clear and moist, no tonsillar swelling or erythema, no exudates  Eyes: Conjunctivae and EOM are normal. Right eye  exhibits no discharge. Left eye exhibits no discharge.  Neck: Normal range of motion. Neck supple.  Cardiovascular: Normal rate, regular rhythm, normal heart sounds and intact distal pulses.  Exam reveals no gallop and no friction rub.   No murmur heard. Distal pulses intact, cap refill brisk and present in all digits  Pulmonary/Chest: Effort normal. No respiratory distress. She has no decreased breath sounds. She has no wheezes. She has rhonchi in the left lower field. She has no rales.  Slight rhonchi in LLF, no increased WOB, no wheezing or rales, no decreased breath sounds, SpO2 99% on RA  Abdominal: Soft. Normal appearance and bowel sounds are normal. She exhibits no distension. There is no tenderness. There is no rigidity, no rebound, no guarding, no tenderness at McBurney's point and negative Murphy's sign.  Musculoskeletal: Normal range of motion.       Right elbow: She exhibits normal range of motion.       Right upper arm: She exhibits tenderness and swelling.       Right forearm: She exhibits tenderness and swelling.  RUE erythematous and swollen from mid-forearm extending past elbow to mid-upper arm, no elbow swelling and FROM intact at elbow. Erythematous area with warmth and TTP. Small healing  scab located just distal to olecranon process, no drainage. Healing lichenified rash over b/l arms. Strength 5/5 in all extremities, sensation grossly intact in all extremities, cap refill brisk and present in all digits No pedal edema  Neurological: She is alert and oriented to person, place, and time. She has normal strength. No sensory deficit.  Skin: Skin is warm and dry. No rash noted. There is erythema.  See skin changes description above  Psychiatric: She has a normal mood and affect.  Nursing note and vitals reviewed.   ED Course  Procedures (including critical care time) Labs Review Labs Reviewed  CBC WITH DIFFERENTIAL - Abnormal; Notable for the following:    Hemoglobin 11.1  (*)    HCT 32.6 (*)    MCV 75.1 (*)    MCH 25.6 (*)    Eosinophils Relative 12 (*)    Eosinophils Absolute 1.0 (*)    All other components within normal limits  BASIC METABOLIC PANEL  I-STAT CG4 LACTIC ACID, ED    Imaging Review Dg Chest 2 View  12/21/2013   CLINICAL DATA:  Right arm pain starting yesterday. History of bronchitis. Cough. Initial encounter.  EXAM: CHEST  2 VIEW  COMPARISON:  12/10/2013; 01/11/2012  FINDINGS: Normal cardiac silhouette and mediastinal contours. No focal parenchymal opacities. No pleural effusion or pneumothorax. No evidence of edema. No acute osseus abnormalities.  IMPRESSION: No acute cardiopulmonary disease.   Electronically Signed   By: Simonne Come M.D.   On: 12/21/2013 17:58     EKG Interpretation None      MDM   Final diagnoses:  Cough  Post-viral cough syndrome  Cellulitis of right upper extremity    22y/o female with RUE swelling concerning for cellulitis. Doubt DVT, will hold off on obtaining u/s. Will obtain basic labs and lactic acid, and give IV clinda here as well as vicodin for pain. For bronchitis-type symptoms, overall improved but still has slight rhonchi in LLF. Will obtain CXR and give nebs. Will reassess shortly.   6:47 PM CBC with diff showing baseline anemia without leukocytosis. BMP WNL. CXR WNL. Pain and lung sounds improved. Tolerating PO well. Has some itching, will give benadryl. Requested that I wrap it. IV clinda given and nearly finished. Will d/c with clinda x7 days. Outlined the area. Discussed re-check here in 2 days, since pt hasn't established care at new PCP's, then f/up ASAP with PCP. Will give rx for pain control, as well as vistaril for itching. For bronchitis-type symptoms, discussed that this seems like post-viral tussive syndrome. Continue using OTC meds, mucinex, steam vaporizer, and inhaler (given another here since pt states ventolin works better for her vs the proair given by [facility] at [facility]). I  explained the diagnosis and have given explicit precautions to return to the ER including for any other new or worsening symptoms. The patient understands and accepts the medical plan as it's been dictated and I have answered their questions. Discharge instructions concerning home care and prescriptions have been given. The patient is STABLE and is discharged to home in good condition.  BP 125/68 mmHg  Pulse 66  Temp(Src) 98.4 F (36.9 C) (Oral)  Resp 18  SpO2 98%  LMP 12/03/2013  Meds ordered this encounter  Medications  . albuterol (PROVENTIL) (2.5 MG/3ML) 0.083% nebulizer solution 5 mg    Sig:   . HYDROcodone-acetaminophen (NORCO/VICODIN) 5-325 MG per tablet 1 tablet    Sig:   . ipratropium (ATROVENT) nebulizer solution 0.5 mg  Sig:   . clindamycin (CLEOCIN) IVPB 600 mg    Sig:     Order Specific Question:  Antibiotic Indication:    Answer:  Cellulitis  . albuterol (PROVENTIL HFA;VENTOLIN HFA) 108 (90 BASE) MCG/ACT inhaler 2 puff    Sig:   . diphenhydrAMINE (BENADRYL) capsule 25 mg    Sig:   . naproxen (NAPROSYN) 500 MG tablet    Sig: Take 1 tablet (500 mg total) by mouth 2 (two) times daily as needed for mild pain, moderate pain or headache (TAKE WITH MEALS.).    Dispense:  20 tablet    Refill:  0    Order Specific Question:  Supervising Provider    Answer:  Eber HongMILLER, BRIAN D [[id number]]  . HYDROcodone-acetaminophen (NORCO) 5-325 MG per tablet    Sig: Take 1-2 tablets by mouth every 6 (six) hours as needed for severe pain.    Dispense:  6 tablet    Refill:  0    Order Specific Question:  Supervising Provider    Answer:  Eber HongMILLER, BRIAN D [[id number]]  . hydrOXYzine (ATARAX/VISTARIL) 25 MG tablet    Sig: Take 1 tablet (25 mg total) by mouth every 6 (six) hours as needed for itching.    Dispense:  28 tablet    Refill:  0    Order Specific Question:  Supervising Provider    Answer:  Eber HongMILLER, BRIAN D [[id number]]  . clindamycin (CLEOCIN) 300 MG capsule    Sig: Take 1 capsule (300 mg total)  by mouth 4 (four) times daily. X 7 days    Dispense:  28 capsule    Refill:  0    Order Specific Question:  Supervising Provider    Answer:  Vida RollerMILLER, BRIAN D [address][id number]]     Fleming Prill Strupp [place] Daleamprubi-Soms, PA-C 12/21/13 1855  Richardean Canalavid H Yao, MD 12/21/13 2317

## 2013-12-21 NOTE — Discharge Instructions (Signed)
Keep arm clean and dry. Apply warm compresses to affected area throughout the day. Take antibiotic until it is finished. Take naprosyn and vicodin as directed, as needed for pain but do not drive or operate machinery with pain medication use. [facility] in 2 days for wound recheck, and see your regular doctor in 1 week for re-check of your symptoms. Monitor area for signs of worsening infection to include, but not limited to: increasing pain, redness, drainage/pus, or swelling outside the lines drawn today. For your cough, continue your home medications and inhalers, use steam vaporizers to help cough expectoration, as well as mucinex. Use vistaril for any itching. Return to emergency department for emergent changing or worsening symptoms.   Cellulitis Cellulitis is an infection of the skin and the tissue beneath it. The infected area is usually red and tender. Cellulitis occurs most often in the arms and lower legs.  CAUSES  Cellulitis is caused by bacteria that enter the skin through cracks or cuts in the skin. The most common types of bacteria that cause cellulitis are staphylococci and streptococci. SIGNS AND SYMPTOMS   Redness and warmth.  Swelling.  Tenderness or pain.  Fever. DIAGNOSIS  Your health care provider can usually determine what is wrong based on a physical exam. Blood tests may also be done. TREATMENT  Treatment usually involves taking an antibiotic medicine. HOME CARE INSTRUCTIONS   Take your antibiotic medicine as directed by your health care provider. Finish the antibiotic even if you start to feel better.  Keep the infected arm or leg elevated to reduce swelling.  Apply a warm cloth to the affected area up to 4 times per day to relieve pain.  Take medicines only as directed by your health care provider.  Keep all follow-up visits as directed by your health care provider. SEEK MEDICAL CARE IF:    You notice red streaks coming from the infected area.  Your red area gets larger or turns dark in color.  Your bone or joint underneath the infected area becomes painful after the skin has healed.  Your infection returns in the same area or another area.  You notice a swollen bump in the infected area.  You develop new symptoms.  You have a fever. SEEK IMMEDIATE MEDICAL CARE IF:   You feel very sleepy.  You develop vomiting or diarrhea.  You have a general ill feeling (malaise) with muscle aches and pains. MAKE SURE YOU:   Understand these instructions.  Will watch your condition.  Will get help right away if you are not doing well or get worse. Document Released: 11/11/2004 Document Revised: 06/18/2013 Document Reviewed: 04/19/2011 [facility] Patient Information 2015 [place], [place]. This information is not intended to replace advice given to you by your health care provider. Make sure you discuss any questions you have with your health care provider.  Cough, Adult  A cough is a reflex that helps clear your throat and airways. It can help heal the body or may be a reaction to an irritated airway. A cough may only last 2 or 3 weeks (acute) or may last more than 8 weeks (chronic).  CAUSES Acute cough:  Viral or bacterial infections. Chronic cough:  Infections.  Allergies.  Asthma.  Post-nasal drip.  Smoking.  Heartburn or acid reflux.  Some medicines.  Chronic lung problems (COPD).  Cancer. SYMPTOMS   Cough.  Fever.  Chest pain.  Increased breathing rate.  High-pitched whistling sound  when breathing (wheezing).  Colored mucus that you cough up (sputum). TREATMENT   A bacterial cough may be treated with antibiotic medicine.  A viral cough must run its course and will not respond to antibiotics.  Your caregiver may recommend other treatments if you have a chronic cough. HOME CARE INSTRUCTIONS   Only take over-the-counter or prescription  medicines for pain, discomfort, or fever as directed by your caregiver. Use cough suppressants only as directed by your caregiver.  Use a cold steam vaporizer or humidifier in your bedroom or home to help loosen secretions.  Sleep in a semi-upright position if your cough is worse at night.  Rest as needed.  Stop smoking if you smoke. SEEK IMMEDIATE MEDICAL CARE IF:   You have pus in your sputum.  Your cough starts to worsen.  You cannot control your cough with suppressants and are losing sleep.  You begin coughing up blood.  You have difficulty breathing.  You develop pain which is getting worse or is uncontrolled with medicine.  You have a fever. MAKE SURE YOU:   Understand these instructions.  Will watch your condition.  Will get help right away if you are not doing well or get worse. Document Released: 07/31/2010 Document Revised: 04/26/2011 Document Reviewed: 07/31/2010 [facility] Patient Information 2015 [place], [place]. This information is not intended to replace advice given to you by your health care provider. Make sure you discuss any questions you have with your health care provider.

## 2013-12-21 NOTE — ED Notes (Signed)
Pt c/o right arm pain starting yesterday and swelling starting today, decreased mobility. Pt has rash to arm bilaterally which began in September 2015. Pt was diagnosed with bronchitis 2 weeks ago, feels that treatment has been ineffective. Pt c/o cough.

## 2013-12-21 NOTE — ED Notes (Signed)
Pt made aware that she should not be driving home after receiving medication in the ER. Pt understands this but verbalizes that she has no ride home at this time. Pt made aware that she can be charged if she is pulled over while driving under the influence of medication. Pt understands this and understands that she is being asked to wait in the lobby for approx 4 more hours until she is able to drive.

## 2014-02-05 ENCOUNTER — Emergency Department (HOSPITAL_COMMUNITY)
Admission: EM | Admit: 2014-02-05 | Discharge: 2014-02-05 | Disposition: A | Discharge status: Home / Self Care | Payer: Medicaid Other | Attending: Emergency Medicine | Admitting: Emergency Medicine

## 2014-02-05 ENCOUNTER — Encounter (HOSPITAL_COMMUNITY): Payer: Self-pay | Admitting: Emergency Medicine

## 2014-02-05 ENCOUNTER — Emergency Department (HOSPITAL_COMMUNITY): Payer: Medicaid Other

## 2014-02-05 DIAGNOSIS — J159 Unspecified bacterial pneumonia: Secondary | ICD-10-CM | POA: Insufficient documentation

## 2014-02-05 DIAGNOSIS — J029 Acute pharyngitis, unspecified: Secondary | ICD-10-CM | POA: Diagnosis present

## 2014-02-05 DIAGNOSIS — R1011 Right upper quadrant pain: Secondary | ICD-10-CM | POA: Insufficient documentation

## 2014-02-05 DIAGNOSIS — Z8619 Personal history of other infectious and parasitic diseases: Secondary | ICD-10-CM | POA: Insufficient documentation

## 2014-02-05 DIAGNOSIS — Z87891 Personal history of nicotine dependence: Secondary | ICD-10-CM | POA: Insufficient documentation

## 2014-02-05 DIAGNOSIS — Z3202 Encounter for pregnancy test, result negative: Secondary | ICD-10-CM | POA: Insufficient documentation

## 2014-02-05 DIAGNOSIS — M545 Low back pain: Secondary | ICD-10-CM | POA: Insufficient documentation

## 2014-02-05 DIAGNOSIS — R109 Unspecified abdominal pain: Secondary | ICD-10-CM

## 2014-02-05 DIAGNOSIS — J189 Pneumonia, unspecified organism: Secondary | ICD-10-CM

## 2014-02-05 DIAGNOSIS — Z79899 Other long term (current) drug therapy: Secondary | ICD-10-CM | POA: Diagnosis not present

## 2014-02-05 DIAGNOSIS — Z8614 Personal history of Methicillin resistant Staphylococcus aureus infection: Secondary | ICD-10-CM | POA: Diagnosis not present

## 2014-02-05 DIAGNOSIS — J020 Streptococcal pharyngitis: Secondary | ICD-10-CM | POA: Diagnosis not present

## 2014-02-05 DIAGNOSIS — Z792 Long term (current) use of antibiotics: Secondary | ICD-10-CM | POA: Insufficient documentation

## 2014-02-05 DIAGNOSIS — J45909 Unspecified asthma, uncomplicated: Secondary | ICD-10-CM | POA: Diagnosis not present

## 2014-02-05 DIAGNOSIS — Z7952 Long term (current) use of systemic steroids: Secondary | ICD-10-CM | POA: Diagnosis not present

## 2014-02-05 DIAGNOSIS — R000 Tachycardia, unspecified: Secondary | ICD-10-CM | POA: Diagnosis not present

## 2014-02-05 LAB — CBC WITH DIFFERENTIAL/PLATELET
Basophils Absolute: 0 10*3/uL (ref 0.0–0.1)
Basophils Relative: 0 % (ref 0–1)
Eosinophils Absolute: 0.7 10*3/uL (ref 0.0–0.7)
Eosinophils Relative: 4 % (ref 0–5)
HCT: 34 % — ABNORMAL LOW (ref 36.0–46.0)
Hemoglobin: 11.4 g/dL — ABNORMAL LOW (ref 12.0–15.0)
Lymphocytes Relative: 14 % (ref 12–46)
Lymphs Abs: 2.4 10*3/uL (ref 0.7–4.0)
MCH: 26 pg (ref 26.0–34.0)
MCHC: 33.5 g/dL (ref 30.0–36.0)
MCV: 77.6 fL — ABNORMAL LOW (ref 78.0–100.0)
Monocytes Absolute: 1.1 10*3/uL — ABNORMAL HIGH (ref 0.1–1.0)
Monocytes Relative: 6 % (ref 3–12)
Neutro Abs: 13.1 10*3/uL — ABNORMAL HIGH (ref 1.7–7.7)
Neutrophils Relative %: 76 % (ref 43–77)
Platelets: 246 10*3/uL (ref 150–400)
RBC: 4.38 MIL/uL (ref 3.87–5.11)
RDW: 15.6 % — ABNORMAL HIGH (ref 11.5–15.5)
WBC: 17.2 10*3/uL — ABNORMAL HIGH (ref 4.0–10.5)

## 2014-02-05 LAB — URINALYSIS, ROUTINE W REFLEX MICROSCOPIC
Bilirubin Urine: NEGATIVE
Glucose, UA: NEGATIVE mg/dL
Hgb urine dipstick: NEGATIVE
Ketones, ur: NEGATIVE mg/dL
Leukocytes, UA: NEGATIVE
Nitrite: NEGATIVE
Protein, ur: NEGATIVE mg/dL
Specific Gravity, Urine: 1.02 (ref 1.005–1.030)
Urobilinogen, UA: 1 mg/dL (ref 0.0–1.0)
pH: 8 (ref 5.0–8.0)

## 2014-02-05 LAB — COMPREHENSIVE METABOLIC PANEL
ALT: 10 U/L (ref 0–35)
AST: 20 U/L (ref 0–37)
Albumin: 3.7 g/dL (ref 3.5–5.2)
Alkaline Phosphatase: 72 U/L (ref 39–117)
Anion gap: 9 (ref 5–15)
BUN: 9 mg/dL (ref 6–23)
CO2: 22 mmol/L (ref 19–32)
Calcium: 8.6 mg/dL (ref 8.4–10.5)
Chloride: 106 mEq/L (ref 96–112)
Creatinine, Ser: 0.85 mg/dL (ref 0.50–1.10)
GFR calc Af Amer: >90 mL/min (ref >90)
GFR calc non Af Amer: >90 mL/min (ref >90)
Glucose, Bld: 98 mg/dL (ref 70–99)
Potassium: 4.1 mmol/L (ref 3.5–5.1)
Sodium: 137 mmol/L (ref 135–145)
Total Bilirubin: 0.8 mg/dL (ref 0.3–1.2)
Total Protein: 7.6 g/dL (ref 6.0–8.3)

## 2014-02-05 LAB — LIPASE, BLOOD: Lipase: 15 U/L (ref 11–59)

## 2014-02-05 LAB — POC URINE PREG, ED: Preg Test, Ur: NEGATIVE

## 2014-02-05 LAB — RAPID STREP SCREEN (MED CTR MEBANE ONLY): Streptococcus, Group A Screen (Direct): POSITIVE — AB

## 2014-02-05 IMAGING — CR DG CHEST 2V
2 series · 2 of 2 positions shown · non-contrast
Comparison: [DATE] chest radiograph as well as CT abdomen
and pelvis including the lung bases [DATE].

CLINICAL DATA: Cough

EXAM:
CHEST  2 VIEW

[w chest pa]
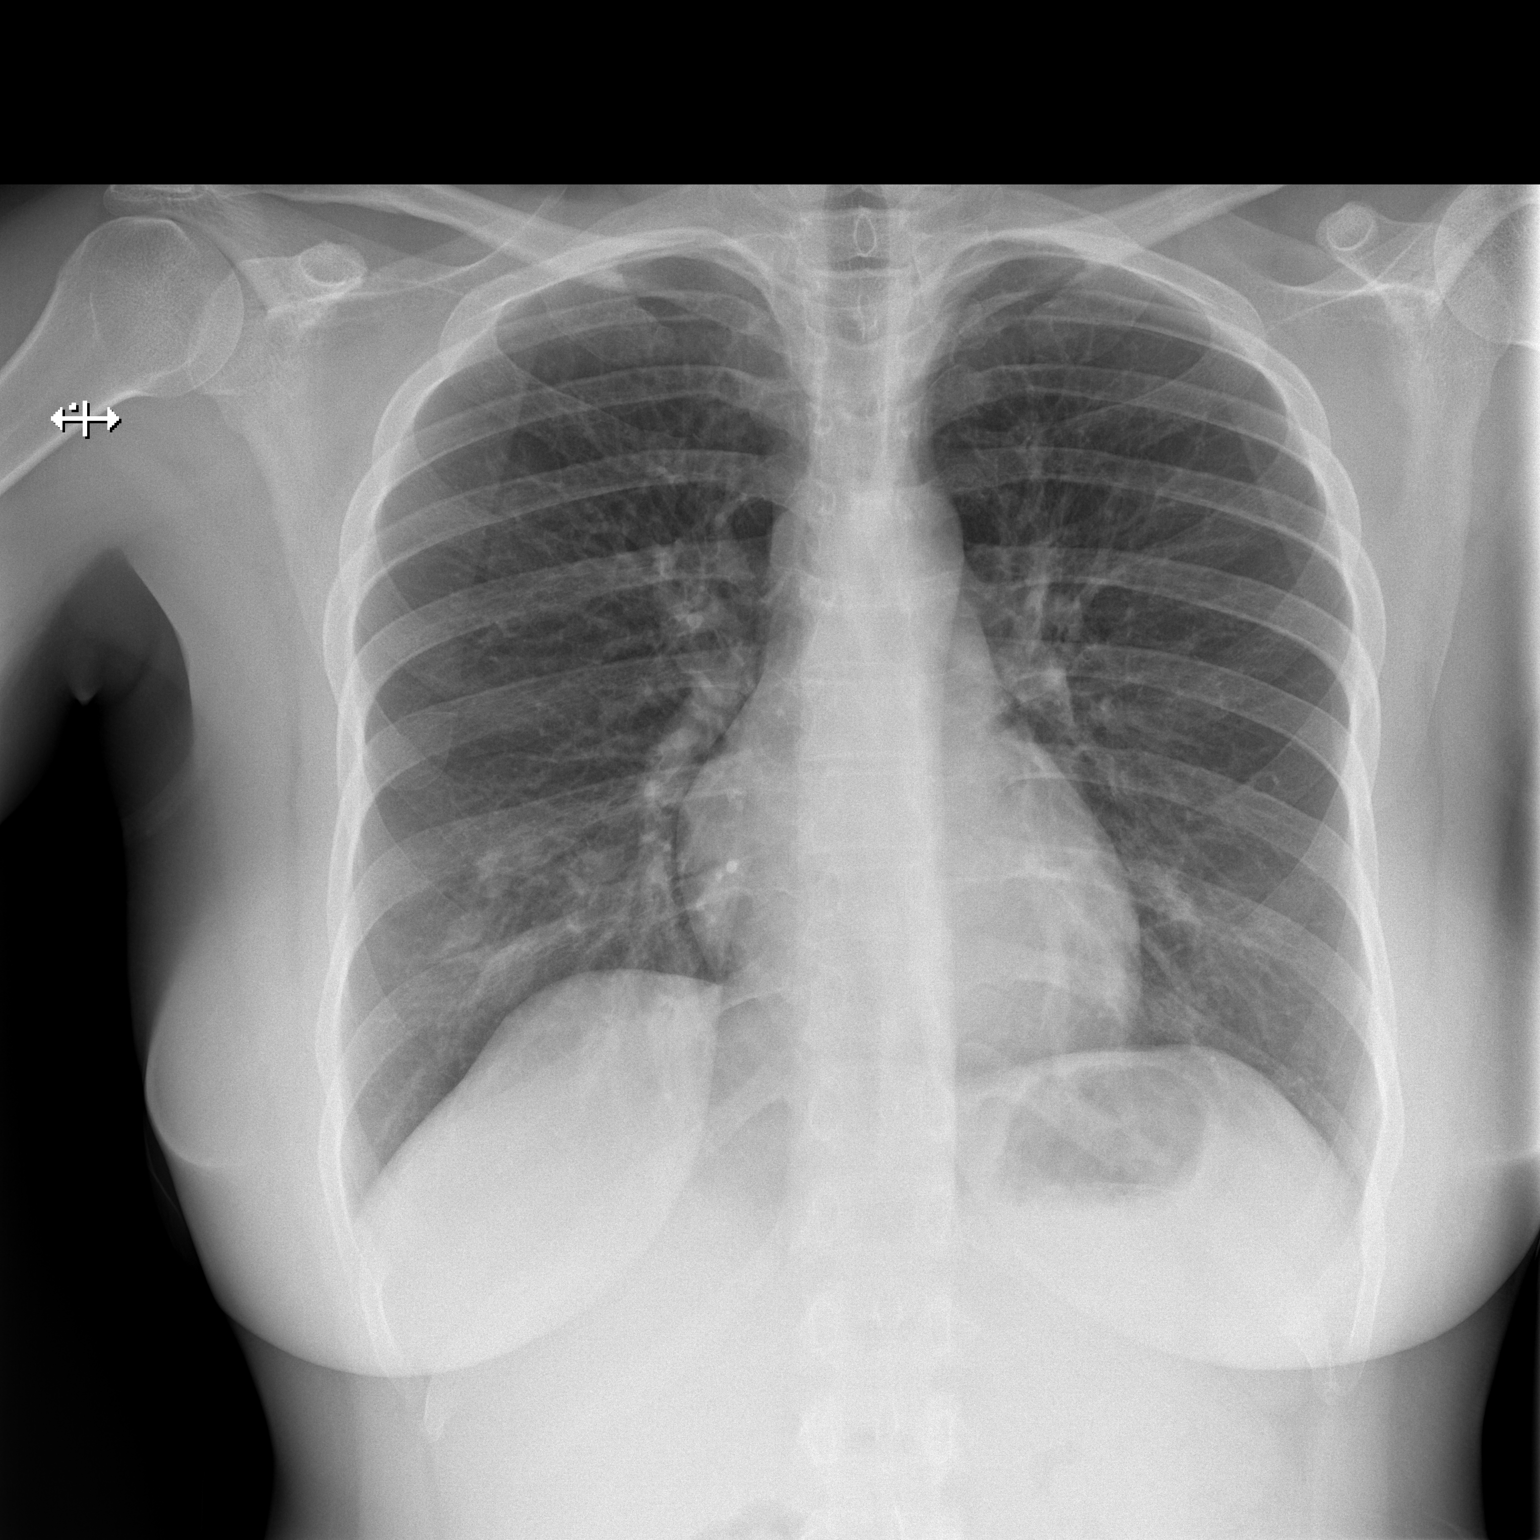

[w chest lat]
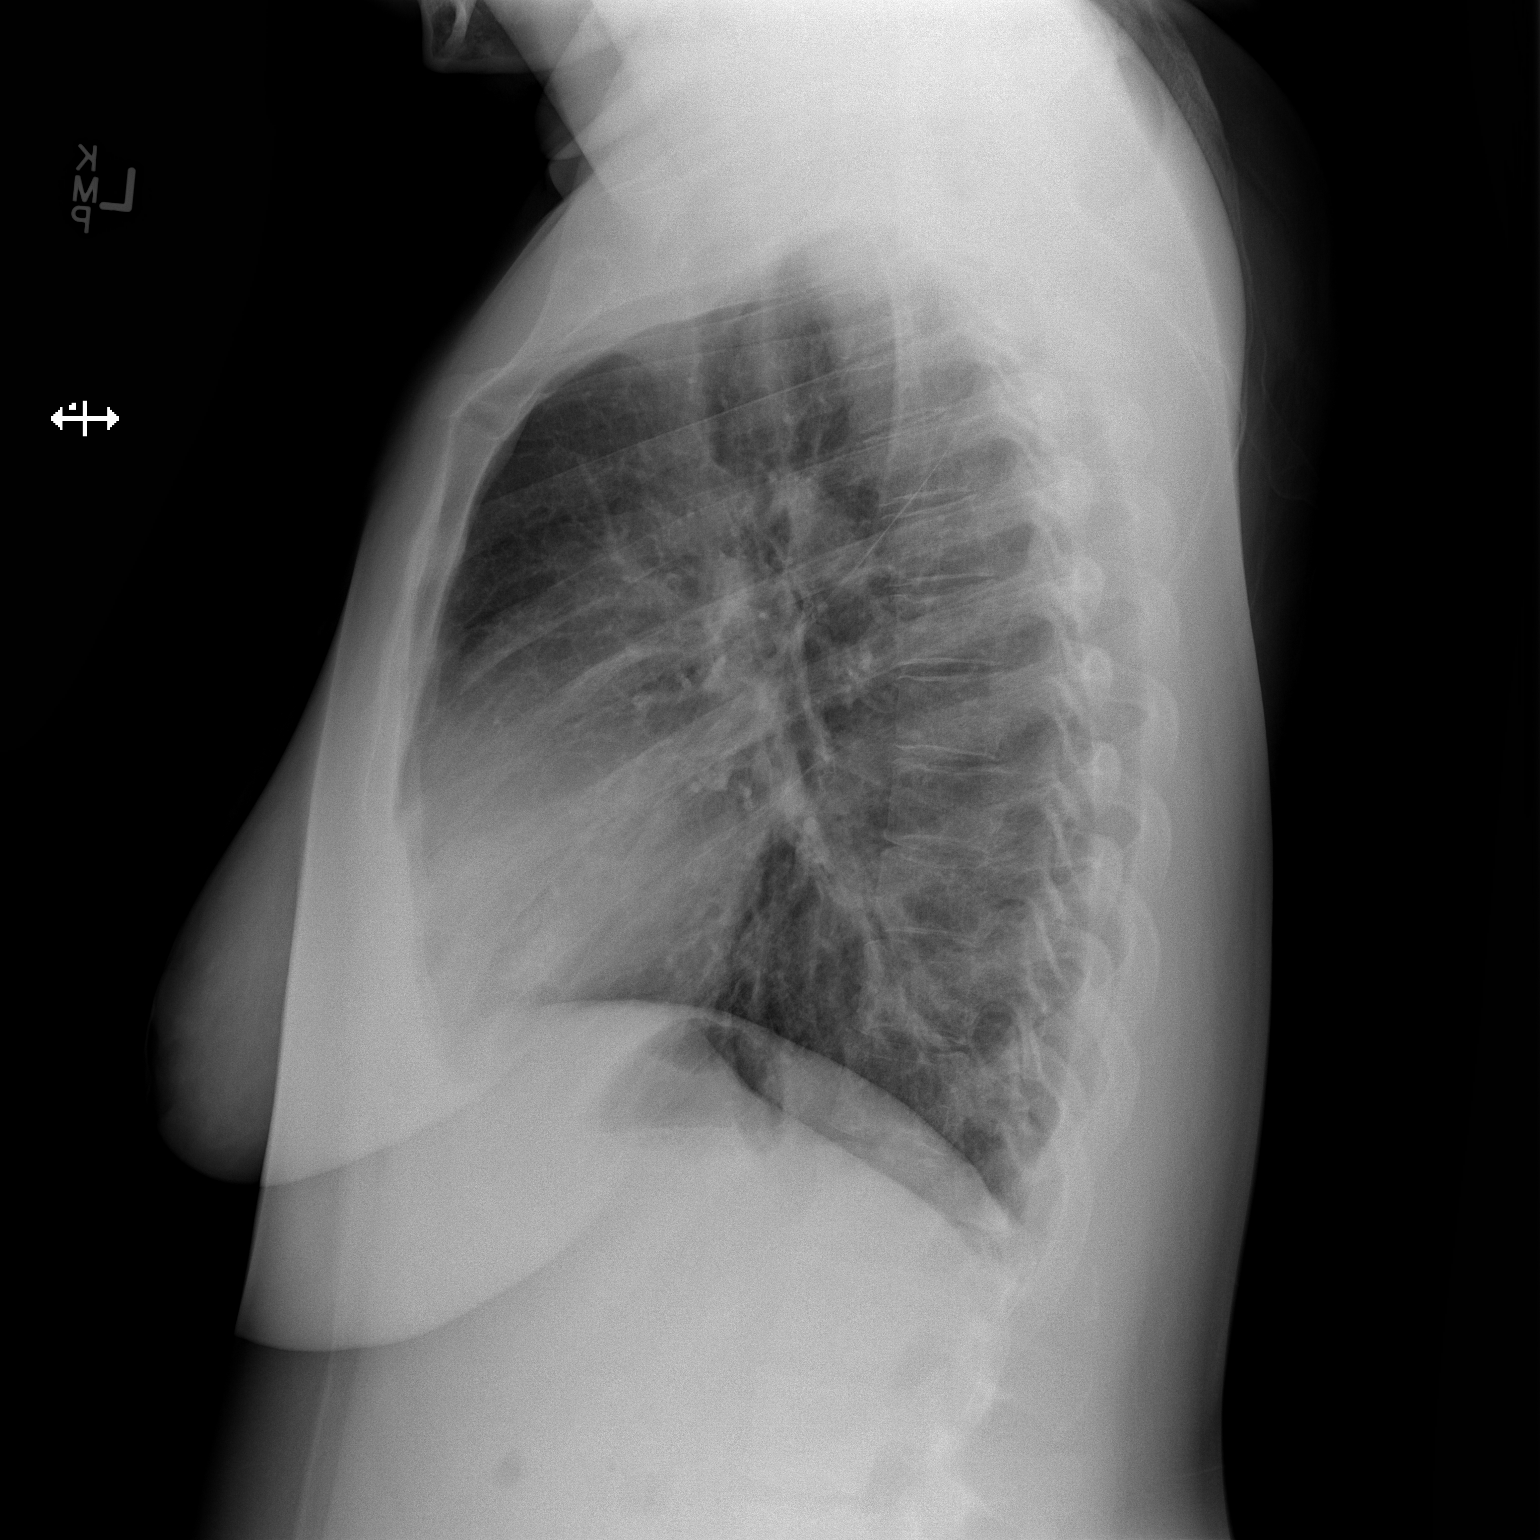

[2 of 2 positions shown; findings below may reference images not displayed]

FINDINGS: There is patchy infiltrate in the right middle lobe. Lungs elsewhere
are clear. The heart size and pulmonary vascularity are normal. No
adenopathy. No bone lesions.
IMPRESSION: Patchy infiltrate right middle lobe

## 2014-02-05 IMAGING — CT CT RENAL STONE PROTOCOL
1 series · 14 of 20 positions shown, 19 images · non-contrast
Comparison: Ob ultrasound [DATE] and earlier. Chest radiographs
[DATE].

CLINICAL DATA: 23-year-old female with right side and lateral
abdominal pain for 2-3 days, similar ppm prior episodes in the past.
Initial encounter.

EXAM:
CT ABDOMEN AND PELVIS WITHOUT CONTRAST
TECHNIQUE: Multidetector CT imaging of the abdomen and pelvis was performed
following the standard protocol without IV contrast.

[Series 6: lung · axial · 0.64mm/px · z∈[-124,-39]mm · 14 of 20 slices shown, 19 images]
[im 2/20  soft-tissue]
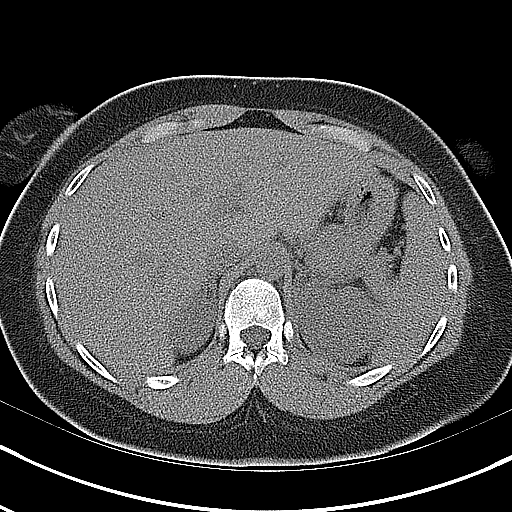
[im 2/20  bone]
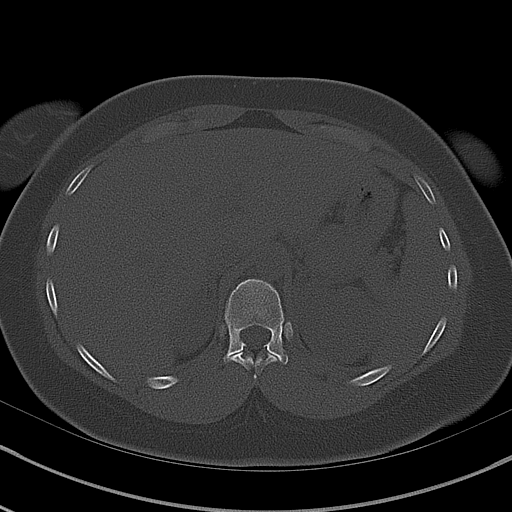
[im 4/20  soft-tissue]
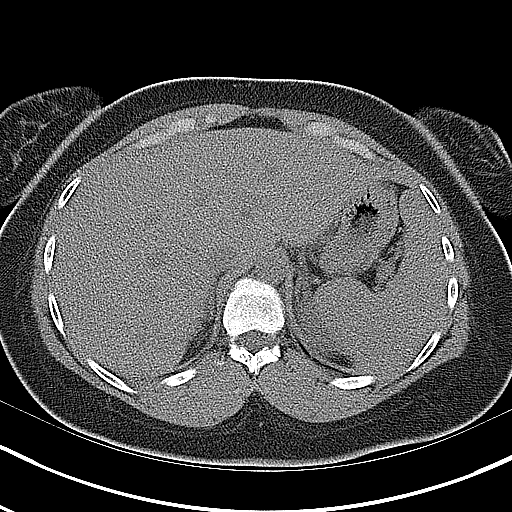
[im 5/20  soft-tissue]
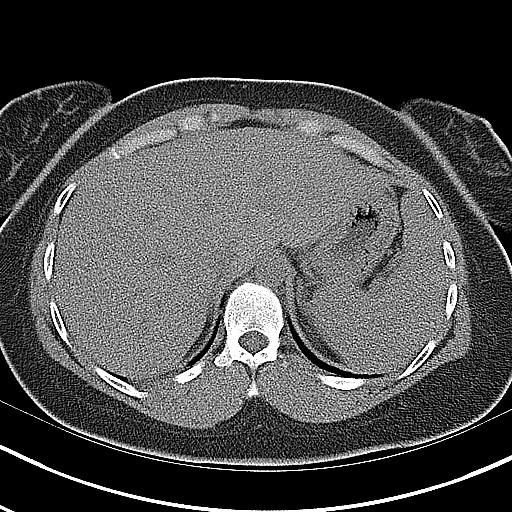
[im 6/20  soft-tissue]
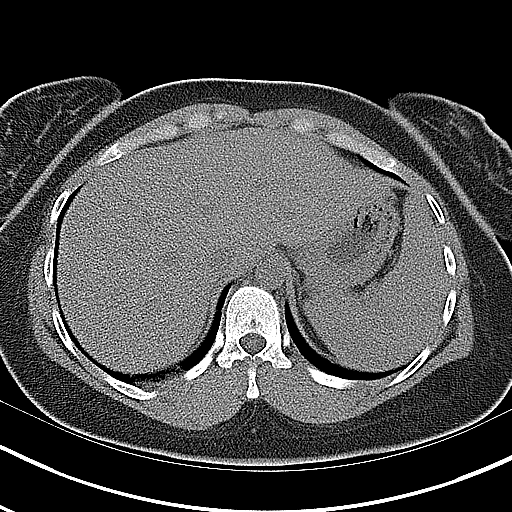
[im 8/20  soft-tissue]
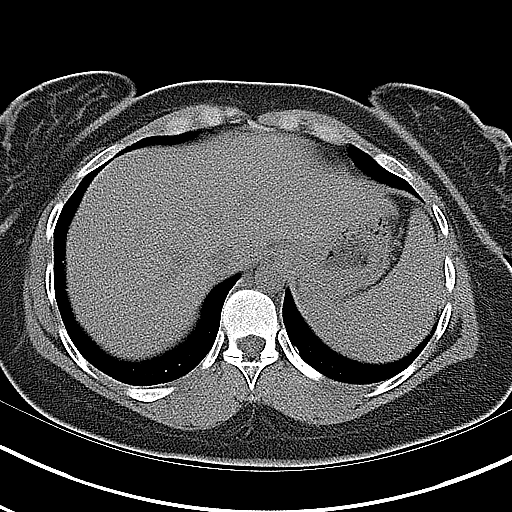
[im 9/20  soft-tissue]
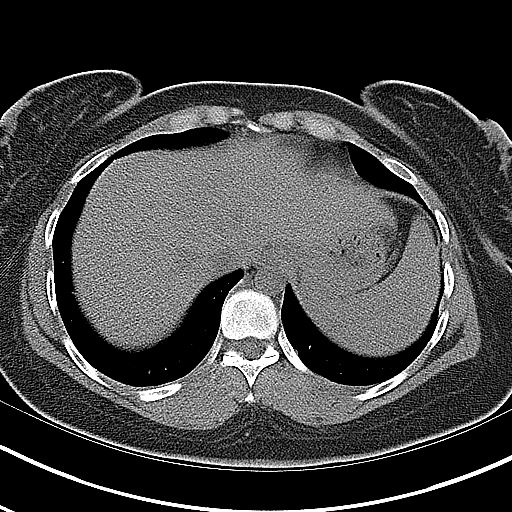
[im 11/20  soft-tissue]
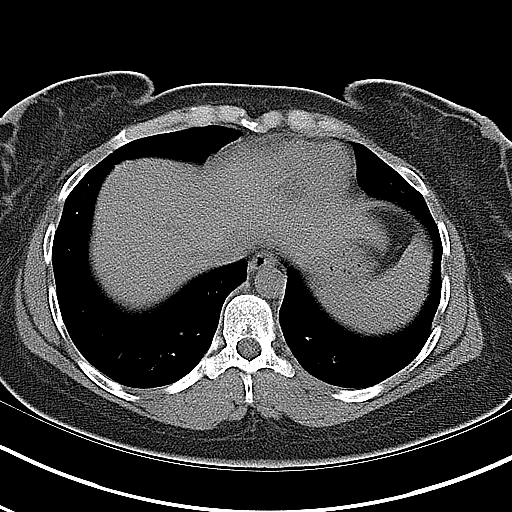
[im 12/20  soft-tissue]
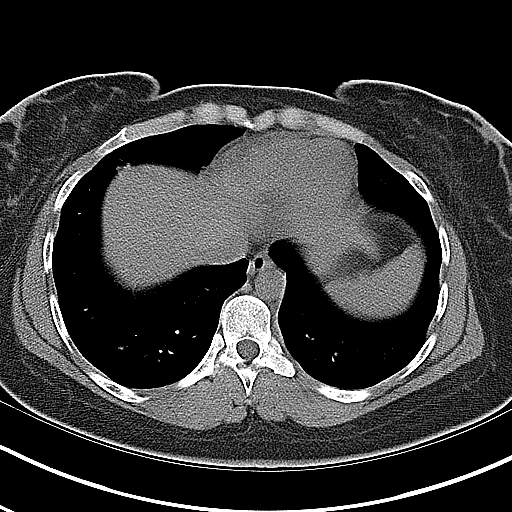
[im 13/20  soft-tissue]
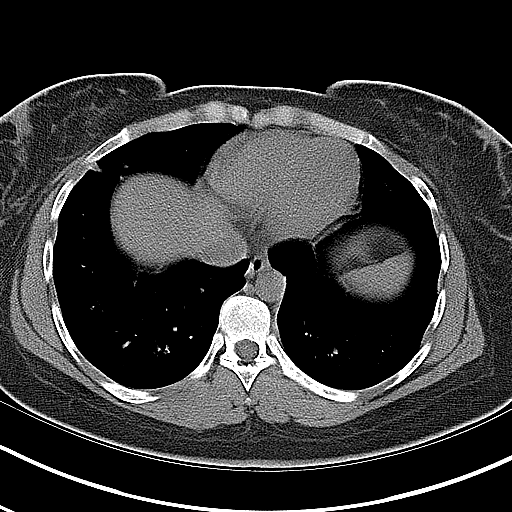
[im 13/20  bone]
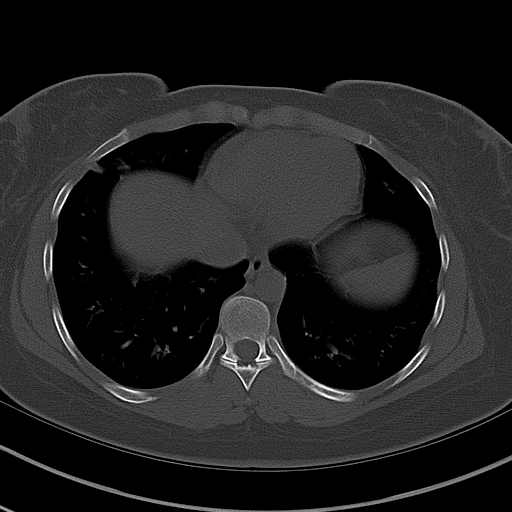
[im 15/20  soft-tissue]
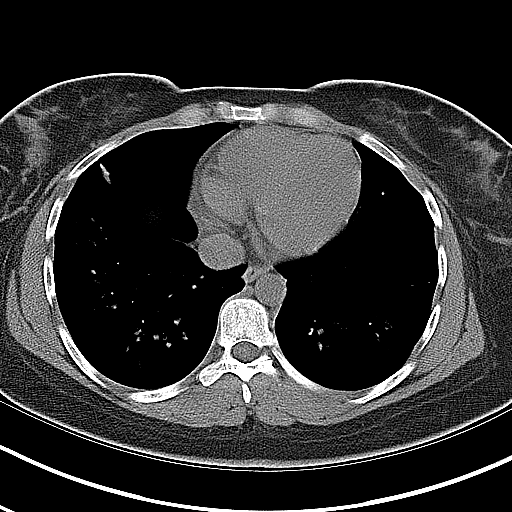
[im 16/20  soft-tissue]
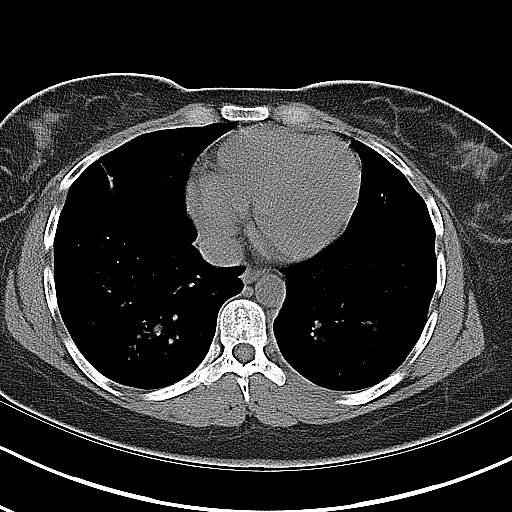
[im 16/20  lung]
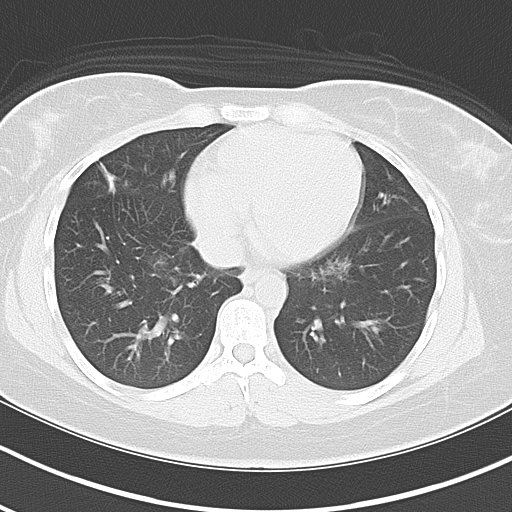
[im 17/20  soft-tissue]
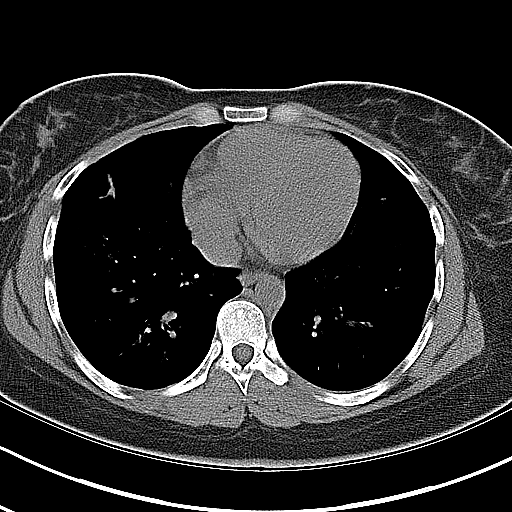
[im 17/20  lung]
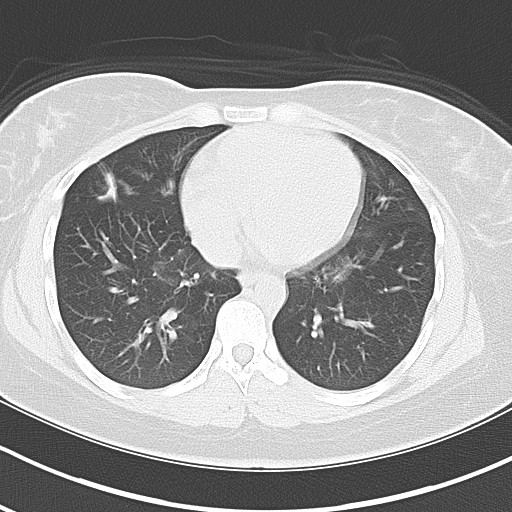
[im 18/20  lung]
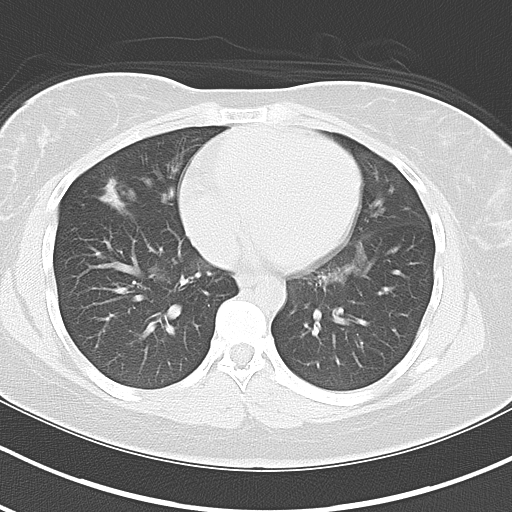
[im 19/20  soft-tissue]
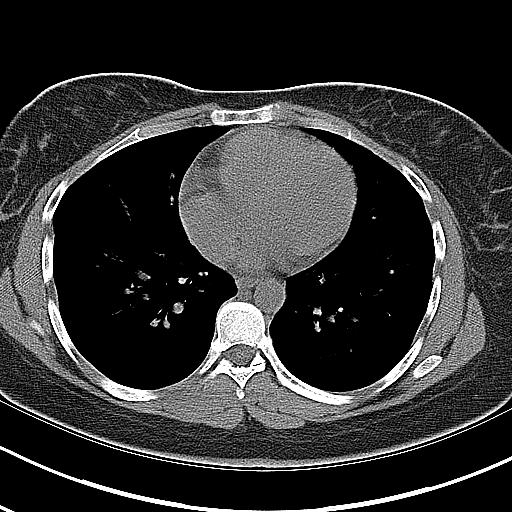
[im 19/20  lung]
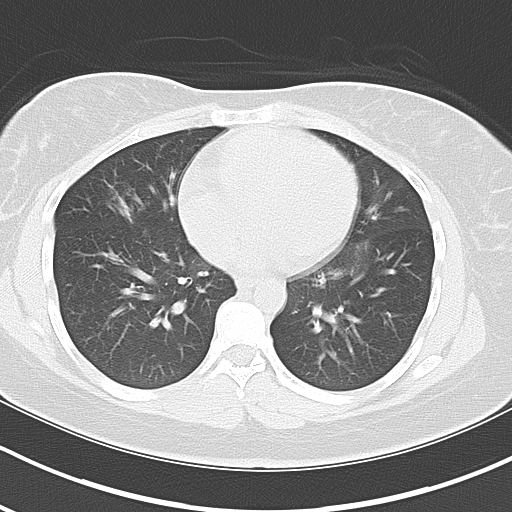

[14 of 20 positions shown; findings below may reference images not displayed]

FINDINGS: Mild respiratory motion artifact at the lung bases. There is
peribronchial thickening and patchy peribronchovascular opacity in
the right middle lobe (series 6, image 3). There is also a lingula
peribronchial thickening. No pleural or pericardial effusion.

No osseous abnormality identified.

No pelvic free fluid. Diminutive bladder. Negative non contrast
uterus and adnexa.

Redundant but otherwise negative distal colon. Retained stool
throughout the colon. Left colon, transverse colon, right colon, and
appendix are normal. Negative terminal ileum. No dilated small
bowel. Decompressed stomach and duodenum.

Noncontrast liver, gallbladder, spleen, pancreas, and adrenal glands
are within normal limits.

No nephrolithiasis or perinephric stranding. No hydronephrosis or
hydroureter. No periureteral stranding. No abdominal free fluid.
IMPRESSION: 1. No urologic calculus or obstructive uropathy. No acute or
inflammatory findings identified. Normal appendix.
2. Inflammatory changes in the right middle lobe, and to a lesser
extent the lingula. Query acute or recent bronchopneumonia.

## 2014-02-05 MED ORDER — NAPROXEN 500 MG PO TABS: 500.0000 mg | ORAL_TABLET | Freq: Two times a day (BID) | ORAL | Status: DC

## 2014-02-05 MED ORDER — KETOROLAC TROMETHAMINE 30 MG/ML IJ SOLN
30.0000 mg | Freq: Once | INTRAMUSCULAR | Status: AC
  Administered 2014-02-05: 30 mg via INTRAVENOUS
  Filled 2014-02-05: qty 1

## 2014-02-05 MED ORDER — MORPHINE SULFATE 4 MG/ML IJ SOLN
4.0000 mg | Freq: Once | INTRAMUSCULAR | Status: AC
  Administered 2014-02-05: 4 mg via INTRAVENOUS
  Filled 2014-02-05: qty 1

## 2014-02-05 MED ORDER — AZITHROMYCIN 250 MG PO TABS: 250.0000 mg | ORAL_TABLET | Freq: Every day | ORAL | Status: DC

## 2014-02-05 NOTE — ED Notes (Signed)
Patient transported to CT 

## 2014-02-05 NOTE — ED Notes (Signed)
Patient has been notified that a urine sample is needed

## 2014-02-05 NOTE — ED Notes (Signed)
Pt. Is unable to use the restroom at this time, but is aware that we need a urine.

## 2014-02-05 NOTE — ED Notes (Signed)
Pt report right flank for three days, sore throat since last night. Pt presents with fever 102.4. Pt denies urinary symptoms.

## 2014-02-05 NOTE — ED Provider Notes (Signed)
CSN: [id number]     Arrival date & time 02/05/14  1135 History   First MD Initiated Contact with Patient 02/05/14 1139     Chief Complaint  Patient presents with  . sore throat/ fever    . Abdominal Pain     (Consider location/radiation/quality/duration/timing/severity/associated sxs/prior Treatment) HPI Pt is a 23yo female presenting to ED with c/o right sided flank pain that started 3 days ago as well as sore throat since last night. Right flank pain is cramping and stabbing, 10/10, worse with certain positions. Pt states she has had this pain since she was pregnant but was unable to get a CT last year due to being pregnant. Pt has not f/u since given birth in July of this year.  Pt states she does not have a PCP.  Pain worsened over last 3 days. Nothing seems to make pain better. She has tried tylenol and ibuprofen w/o relief. Pt also c/o sore throat, 10/10, states she is unable to eat or drink due to the pain.  Reports hot and cold chills, unsure if she has a fever. Denies sick contacts or recent travel. Denies urinary or vaginal symptoms. Denies hx of abdominal surgeries. Denies hx of gallstones or renal stones.   Past Medical History  Diagnosis Date  . Asthma   . NVD (normal vaginal delivery) 08/30/2010  . Hx MRSA infection 2005  . Trichimoniasis   . Chlamydia   . Gonorrhea    Past Surgical History  Procedure Laterality Date  . No past surgeries    . Multiple tooth extractions     Family History  Problem Relation Age of Onset  . Anesthesia problems Neg Hx   . Other Neg Hx   . Diabetes Maternal Grandmother   . Hypertension Father   . Heart disease Father   . Lung disease Father    History  Substance Use Topics  . Smoking status: Former Smoker -- 0.25 packs/day for 5 years    Types: Cigarettes    Quit date: 12/27/2012  . Smokeless tobacco: Never Used  . Alcohol Use: No   OB History    Gravida Para Term Preterm AB TAB SAB Ectopic Multiple Living   4 4 4       4       Review of Systems  Constitutional: Positive for fever, chills and appetite change.  HENT: Positive for sore throat. Negative for congestion, ear discharge, ear pain, trouble swallowing and voice change.   Respiratory: Negative for cough and shortness of breath.   Cardiovascular: Negative for chest pain and palpitations.  Gastrointestinal: Positive for abdominal pain ( right side). Negative for nausea, vomiting, diarrhea and constipation.  Genitourinary: Positive for flank pain ( right). Negative for dysuria, urgency, hematuria, decreased urine volume, vaginal bleeding, vaginal discharge, menstrual problem and pelvic pain.  Musculoskeletal: Positive for back pain ( right mid-to lower). Negative for myalgias.  All other systems reviewed and are negative.     Allergies  Iodine and Sulfonamide derivatives  Home Medications   Prior to Admission medications   Medication Sig Start Date End Date Taking? Authorizing Provider  albuterol (PROVENTIL HFA;VENTOLIN HFA) 108 (90 BASE) MCG/ACT inhaler Inhale 2 puffs into the lungs every 6 (six) hours as needed for wheezing or shortness of breath (wheezing & shortness of breath).    Yes Historical Provider, MD  medroxyPROGESTERone (DEPO-PROVERA) 150 MG/ML injection Inject 1 mL (150 mg total) into the muscle every 3 (three) months. 09/06/13  Yes Loney LaurenceMichelle A Horvath, MD  clindamycin (CLEOCIN) 300 MG capsule Take 1 capsule (300 mg total) by mouth 4 (four) times daily. X 7 days Patient not taking: Reported on 02/05/2014 12/21/13   Donnita FallsMercedes Strupp Camprubi-Soms, PA-C  HYDROcodone-acetaminophen (NORCO) 5-325 MG per tablet Take 1-2 tablets by mouth every 6 (six) hours as needed for severe pain. Patient not taking: Reported on 02/05/2014 12/21/13   Donnita FallsMercedes Strupp Camprubi-Soms, PA-C  hydrOXYzine (ATARAX/VISTARIL) 25 MG tablet Take 1 tablet (25 mg total) by mouth every 6 (six) hours as needed for itching. Patient not taking: Reported on 02/05/2014 12/21/13    Donnita FallsMercedes Strupp Camprubi-Soms, PA-C  naproxen (NAPROSYN) 500 MG tablet Take 1 tablet (500 mg total) by mouth 2 (two) times daily as needed for mild pain, moderate pain or headache (TAKE WITH MEALS.). Patient not taking: Reported on 02/05/2014 12/21/13   Donnita FallsMercedes Strupp Camprubi-Soms, PA-C  predniSONE (DELTASONE) 20 MG tablet Take 2 tablets (40 mg total) by mouth daily. Patient not taking: Reported on 02/05/2014 12/10/13   Raeford RazorStephen Kohut, MD   BP 137/83 mmHg  Pulse 99  Temp(Src) 100.2 F (37.9 C) (Oral)  Resp 16  SpO2 97%  LMP 12/30/2013 Physical Exam  Constitutional: She appears well-developed and well-nourished. No distress.  Pt texting on phone throughout entire exam, non-toxic appearing.  HENT:  Head: Normocephalic and atraumatic.  Right Ear: Hearing, tympanic membrane, external ear and ear canal normal.  Left Ear: Hearing, tympanic membrane, external ear and ear canal normal.  Mouth/Throat: Uvula is midline and mucous membranes are normal. Posterior oropharyngeal edema and posterior oropharyngeal erythema present. No oropharyngeal exudate or tonsillar abscesses.  Eyes: Conjunctivae are normal. No scleral icterus.  Neck: Normal range of motion.  Cardiovascular: Regular rhythm and normal heart sounds.  Tachycardia present.   Pulmonary/Chest: Effort normal and breath sounds normal. No respiratory distress. She has no wheezes. She has no rales. She exhibits no tenderness.  Abdominal: Soft. Bowel sounds are normal. She exhibits no distension and no mass. There is tenderness in the right upper quadrant. There is CVA tenderness ( right). There is no rebound and no guarding.    Musculoskeletal: Normal range of motion.  Neurological: She is alert.  Skin: Skin is warm and dry. She is not diaphoretic.  Nursing note and vitals reviewed.   ED Course  Procedures (including critical care time) Labs Review Labs Reviewed  RAPID STREP SCREEN - Abnormal; Notable for the following:     Streptococcus, Group A Screen (Direct) POSITIVE (*)    All other components within normal limits  CBC WITH DIFFERENTIAL - Abnormal; Notable for the following:    WBC 17.2 (*)    Hemoglobin 11.4 (*)    HCT 34.0 (*)    MCV 77.6 (*)    RDW 15.6 (*)    Neutro Abs 13.1 (*)    Monocytes Absolute 1.1 (*)    All other components within normal limits  COMPREHENSIVE METABOLIC PANEL  URINALYSIS, ROUTINE W REFLEX MICROSCOPIC  LIPASE, BLOOD  POC URINE PREG, ED    Imaging Review Ct Renal Stone Study  02/05/2014   CLINICAL DATA:  23 year old female with right side and lateral abdominal pain for 2-3 days, similar ppm prior episodes in the past. Initial encounter.  EXAM: CT ABDOMEN AND PELVIS WITHOUT CONTRAST  TECHNIQUE: Multidetector CT imaging of the abdomen and pelvis was performed following the standard protocol without IV contrast.  COMPARISON:  Ob ultrasound 09/01/2013 and earlier. Chest radiographs 12/21/2013.  FINDINGS: Mild respiratory motion artifact at the lung bases. There is peribronchial  thickening and patchy peribronchovascular opacity in the right middle lobe (series 6, image 3). There is also a lingula peribronchial thickening. No pleural or pericardial effusion.  No osseous abnormality identified.  No pelvic free fluid. Diminutive bladder. Negative non contrast uterus and adnexa.  Redundant but otherwise negative distal colon. Retained stool throughout the colon. Left colon, transverse colon, right colon, and appendix are normal. Negative terminal ileum. No dilated small bowel. Decompressed stomach and duodenum.  Noncontrast liver, gallbladder, spleen, pancreas, and adrenal glands are within normal limits.  No nephrolithiasis or perinephric stranding. No hydronephrosis or hydroureter. No periureteral stranding. No abdominal free fluid.  IMPRESSION: 1. No urologic calculus or obstructive uropathy. No acute or inflammatory findings identified. Normal appendix. 2. Inflammatory changes in the right  middle lobe, and to a lesser extent the lingula. Query acute or recent bronchopneumonia.   Electronically Signed   By: Augusto Gamble M.D.   On: 02/05/2014 13:49     EKG Interpretation None      MDM   Final diagnoses:  Strep pharyngitis  Right flank pain    Pt is a 23yo female c/o right sided flank pain x 3 days and sore throat since last night. Pt appears agitated during exam, texting on her phone during entire exam.  Vitals: temp of 102.4. On exam, pt is non-toxic appearing. Pharynx: bilateral tonsillar erythema and edema w/o exudates.  Abd: soft, non-distended. Tenderness to RUQ and right flank. Right CVAT.  DDx: strep pharyngitis vs viral pharyngitis with concurrent- cholecystitis, cholelithiasis, nephrolithiasis, pyelonephritis (less likely due to chronicity of pain and no urinary symptoms), doubt appendicitis as no RLQ tenderness. Doubt SBO as no change in BM.  Will get CT stone study and labs including rapid strep, CBC, CMP and lipase.  IV toradol given.   Temp improved to 100.2 after IV toradol.  Labs: leukocytosis, rapid strep- positive, otherwise unremarkable. CT stone study: (1) no urologic calculus or obstructive uropathy, no acute or inflammatory findings identified. Normal appendix.  (2) inflammatory changes in right middle lobe, to lesser extent the lingula.    Discussed pt with Dr. Elesa Massed, although no c/o cough or congestion will get 2 view CXR for further evaluation of possible pneumonia as pt is febrile, will help determine appropriate antibiotic as pt will need to be treated for her strep.    3:46 PM Pt has been able to keep down PO fluids. CXR still pending. Pt signed out to [employer], PA-C at shift change. Plan is to f/u on CXR.  Will tx with PCN for strep pharyngitis if CXR unremarkable.  Pt should f/u with PCP.    Junius Finner, PA-C 02/05/14 1550  Layla Maw Ward, DO 02/05/14 1550

## 2014-02-05 NOTE — Discharge Instructions (Signed)
Please follow the directions provided.  Be sure to follow-up with your primary care provider or you can return to the ED or Urgent care for a re-check.  It does seem you have a pneumonia in the right middle lobe of your lung and strep throat.  The antibiotic provided should help both.  You may take tylenol every 4 hours for pain or fever.  Don't hesitate to return for new, worsening or concerning symptoms.     SEEK IMMEDIATE MEDICAL CARE IF:  Your illness becomes worse. This is especially true if you are elderly or weakened from any other disease.  You cannot control your cough with suppressants and are losing sleep.  You begin coughing up blood.  You develop pain which is getting worse or is uncontrolled with medicines.  Any of the symptoms which initially brought you in for treatment are getting worse rather than better.  You develop shortness of breath or chest pain.

## 2014-02-05 NOTE — ED Provider Notes (Signed)
At end of shift, received hand off report from [facility]rin O'malley, [place]PA-C.  Plan includes waiting for x-ray results to evaluate for pneumonia.   Pt currently sleeping without distress.  4:15 PM: CXR resulted: right middle lope infiltrate.  Discussed findings with pt. Prescriptions for azithromycin provided and instructions to follow-up with a PCP or return her to ensure she is improving.  Pt is well-appearing, in no acute distress and vital signs are stable.  They appear safe to be discharged.  Return precautions provided.    Harle BattiestElizabeth Ciarrah Rae, NP 02/06/14 1128  Gerhard Munchobert Lockwood, MD 02/06/14 2242

## 2014-02-06 ENCOUNTER — Emergency Department (HOSPITAL_COMMUNITY)
Admission: EM | Admit: 2014-02-06 | Discharge: 2014-02-06 | Disposition: A | Discharge status: Home / Self Care | Payer: Medicaid Other | Attending: Emergency Medicine | Admitting: Emergency Medicine

## 2014-02-06 ENCOUNTER — Encounter (HOSPITAL_COMMUNITY): Payer: Self-pay | Admitting: Emergency Medicine

## 2014-02-06 DIAGNOSIS — J159 Unspecified bacterial pneumonia: Secondary | ICD-10-CM | POA: Insufficient documentation

## 2014-02-06 DIAGNOSIS — Z792 Long term (current) use of antibiotics: Secondary | ICD-10-CM | POA: Insufficient documentation

## 2014-02-06 DIAGNOSIS — Z79899 Other long term (current) drug therapy: Secondary | ICD-10-CM | POA: Diagnosis not present

## 2014-02-06 DIAGNOSIS — Z7952 Long term (current) use of systemic steroids: Secondary | ICD-10-CM | POA: Insufficient documentation

## 2014-02-06 DIAGNOSIS — J029 Acute pharyngitis, unspecified: Secondary | ICD-10-CM | POA: Diagnosis present

## 2014-02-06 DIAGNOSIS — J189 Pneumonia, unspecified organism: Secondary | ICD-10-CM

## 2014-02-06 DIAGNOSIS — Z8619 Personal history of other infectious and parasitic diseases: Secondary | ICD-10-CM | POA: Diagnosis not present

## 2014-02-06 DIAGNOSIS — Z8614 Personal history of Methicillin resistant Staphylococcus aureus infection: Secondary | ICD-10-CM | POA: Diagnosis not present

## 2014-02-06 DIAGNOSIS — B9689 Other specified bacterial agents as the cause of diseases classified elsewhere: Secondary | ICD-10-CM

## 2014-02-06 DIAGNOSIS — Z87891 Personal history of nicotine dependence: Secondary | ICD-10-CM | POA: Diagnosis not present

## 2014-02-06 DIAGNOSIS — R509 Fever, unspecified: Secondary | ICD-10-CM

## 2014-02-06 DIAGNOSIS — R630 Anorexia: Secondary | ICD-10-CM | POA: Insufficient documentation

## 2014-02-06 DIAGNOSIS — J028 Acute pharyngitis due to other specified organisms: Secondary | ICD-10-CM

## 2014-02-06 DIAGNOSIS — E860 Dehydration: Secondary | ICD-10-CM | POA: Diagnosis not present

## 2014-02-06 DIAGNOSIS — R5383 Other fatigue: Secondary | ICD-10-CM | POA: Diagnosis not present

## 2014-02-06 DIAGNOSIS — J45909 Unspecified asthma, uncomplicated: Secondary | ICD-10-CM | POA: Insufficient documentation

## 2014-02-06 MED ORDER — HYDROCODONE-IBUPROFEN 7.5-200 MG PO TABS: 1.0000 | ORAL_TABLET | Freq: Four times a day (QID) | ORAL | Status: DC | PRN

## 2014-02-06 MED ORDER — OXYCODONE HCL 5 MG/5ML PO SOLN
5.0000 mg | Freq: Once | ORAL | Status: AC
  Administered 2014-02-06: 5 mg via ORAL
  Filled 2014-02-06: qty 5

## 2014-02-06 MED ORDER — DEXAMETHASONE SODIUM PHOSPHATE 10 MG/ML IJ SOLN
10.0000 mg | Freq: Once | INTRAMUSCULAR | Status: AC
  Administered 2014-02-06: 10 mg via INTRAVENOUS
  Filled 2014-02-06: qty 1

## 2014-02-06 MED ORDER — SODIUM CHLORIDE 0.9 % IV BOLUS (SEPSIS)
1000.0000 mL | INTRAVENOUS | Status: AC
  Administered 2014-02-06: 1000 mL via INTRAVENOUS

## 2014-02-06 MED ORDER — PENICILLIN G BENZATHINE 1200000 UNIT/2ML IM SUSP
1.2000 10*6.[IU] | Freq: Once | INTRAMUSCULAR | Status: AC
  Administered 2014-02-06: 1.2 10*6.[IU] via INTRAMUSCULAR
  Filled 2014-02-06: qty 2

## 2014-02-06 MED ORDER — IBUPROFEN 800 MG PO TABS
800.0000 mg | ORAL_TABLET | Freq: Once | ORAL | Status: AC
  Administered 2014-02-06: 800 mg via ORAL
  Filled 2014-02-06: qty 1

## 2014-02-06 NOTE — ED Notes (Signed)
Writer provided ice pack to pt's left back.

## 2014-02-06 NOTE — Progress Notes (Signed)
  CARE MANAGEMENT ED NOTE 02/06/2014  Patient:  Brittany [name],Brittany [name]   Account Number:  [id number]  Date Initiated:  02/06/2014  Documentation initiated by:  Radford PaxFERRERO,Delshawn Stech  Subjective/Objective Assessment:   Patient presents to Ed with sore throat, unable to swallow     Subjective/Objective Assessment Detail:   Patient diagnosed with strep throat and pneumonia yesterday and given antibiotics but without improvment in symptoms. Patient noted to have been seen 5 times in the ED within the last six months.     Action/Plan:   Action/Plan Detail:   Anticipated DC Date:  02/06/2014     Status Recommendation to Physician:   Result of Recommendation:    Other ED Services  Consult Working Plan    DC Planning Services  Other  PCP issues    Choice offered to / List presented to:            Status of service:  Completed, signed off  ED Comments:   ED Comments Detail:  EDCM spoke to patinet at bedside.  [facility] at [facility]?  Patient reports, "That's what it says on my Medicaid card, but I've never been seen there before."  [facility] strongly urged patient to make a foillow up aapointment at her pcp to ensure wellness.  EDCM explained to patient if she wanted to change her pcp she would have to contact DSS.  Patient verbalized understanding.  No further EDCM needs at this time.

## 2014-02-06 NOTE — ED Notes (Addendum)
Pt states that she was dx with strep throat and pna yesterday and feels worse today than she did yesterday. Cannot swallow she states. Not eating or drinking Is taking antibiotics. Alert and oriented.

## 2014-02-06 NOTE — Discharge Instructions (Signed)
1. Medications: vicoprofen, usual home medications 2. Treatment: rest, drink plenty of fluids,  3. Follow Up: Please followup with your primary doctor in 3 days for discussion of your diagnoses and further evaluation after today's visit; if you do not have a primary care doctor use the resource guide provided to find one; Please return to the ER for worsening fevers, persistent vomiting or other concerns    Dehydration, Adult Dehydration is when you lose more fluids from the body than you take in. Vital organs like the kidneys, brain, and heart cannot function without a proper amount of fluids and salt. Any loss of fluids from the body can cause dehydration.  CAUSES   Vomiting.  Diarrhea.  Excessive sweating.  Excessive urine output.  Fever. SYMPTOMS  Mild dehydration  Thirst.  Dry lips.  Slightly dry mouth. Moderate dehydration  Very dry mouth.  Sunken eyes.  Skin does not bounce back quickly when lightly pinched and released.  Dark urine and decreased urine production.  Decreased tear production.  Headache. Severe dehydration  Very dry mouth.  Extreme thirst.  Rapid, weak pulse (more than 100 beats per minute at rest).  Cold hands and feet.  Not able to sweat in spite of heat and temperature.  Rapid breathing.  Blue lips.  Confusion and lethargy.  Difficulty being awakened.  Minimal urine production.  No tears. DIAGNOSIS  Your caregiver will diagnose dehydration based on your symptoms and your exam. Blood and urine tests will help confirm the diagnosis. The diagnostic evaluation should also identify the cause of dehydration. TREATMENT  Treatment of mild or moderate dehydration can often be done at home by increasing the amount of fluids that you drink. It is best to drink small amounts of fluid more often. Drinking too much at one time can make vomiting worse. Refer to the home care instructions below. Severe dehydration needs to be treated at the  hospital where you will probably be given intravenous (IV) fluids that contain water and electrolytes. HOME CARE INSTRUCTIONS   Ask your caregiver about specific rehydration instructions.  Drink enough fluids to keep your urine clear or pale yellow.  Drink small amounts frequently if you have nausea and vomiting.  Eat as you normally do.  Avoid:  Foods or drinks high in sugar.  Carbonated drinks.  Juice.  Extremely hot or cold fluids.  Drinks with caffeine.  Fatty, greasy foods.  Alcohol.  Tobacco.  Overeating.  Gelatin desserts.  Wash your hands well to avoid spreading bacteria and viruses.  Only take over-the-counter or prescription medicines for pain, discomfort, or fever as directed by your caregiver.  Ask your caregiver if you should continue all prescribed and over-the-counter medicines.  Keep all follow-up appointments with your caregiver. SEEK MEDICAL CARE IF:  You have abdominal pain and it increases or stays in one area (localizes).  You have a rash, stiff neck, or severe headache.  You are irritable, sleepy, or difficult to awaken.  You are weak, dizzy, or extremely thirsty. SEEK IMMEDIATE MEDICAL CARE IF:   You are unable to keep fluids down or you get worse despite treatment.  You have frequent episodes of vomiting or diarrhea.  You have blood or green matter (bile) in your vomit.  You have blood in your stool or your stool looks black and tarry.  You have not urinated in 6 to 8 hours, or you have only urinated a small amount of very dark urine.  You have a fever.  You faint. MAKE  SURE YOU:   Understand these instructions.  Will watch your condition.  Will get help right away if you are not doing well or get worse. Document Released: 02/01/2005 Document Revised: 04/26/2011 Document Reviewed: 09/21/2010 [facility] Patient Information 2015 [employer] [place]. This information is not intended to replace advice given to you by your health  care provider. Make sure you discuss any questions you have with your health care provider.   Strep Throat Strep throat is an infection of the throat caused by a bacteria named Streptococcus pyogenes. Your health care provider may call the infection streptococcal "tonsillitis" or "pharyngitis" depending on whether there are signs of inflammation in the tonsils or back of the throat. Strep throat is most common in children aged 5-15 years during the cold months of the year, but it can occur in people of any age during any season. This infection is spread from person to person (contagious) through coughing, sneezing, or other close contact. SIGNS AND SYMPTOMS   Fever or chills.  Painful, swollen, red tonsils or throat.  Pain or difficulty when swallowing.  White or yellow spots on the tonsils or throat.  Swollen, tender lymph nodes or "glands" of the neck or under the jaw.  Red rash all over the body (rare). DIAGNOSIS  Many different infections can cause the same symptoms. A test must be done to confirm the diagnosis so the right treatment can be given. A "rapid strep test" can help your health care provider make the diagnosis in a few minutes. If this test is not available, a light swab of the infected area can be used for a throat culture test. If a throat culture test is done, results are usually available in a day or two. TREATMENT  Strep throat is treated with antibiotic medicine. HOME CARE INSTRUCTIONS   Gargle with 1 tsp of salt in 1 cup of warm water, 3-4 times per day or as needed for comfort.  Family members who also have a sore throat or fever should be tested for strep throat and treated with antibiotics if they have the strep infection.  Make sure everyone in your household washes their hands well.  Do not share food, drinking cups, or personal items that could cause the infection to spread to others.  You may need to eat a soft food diet until your sore throat gets  better.  Drink enough water and fluids to keep your urine clear or pale yellow. This will help prevent dehydration.  Get plenty of rest.  Stay home from school, day care, or work until you have been on antibiotics for 24 hours.  Take medicines only as directed by your health care provider.  Take your antibiotic medicine as directed by your health care provider. Finish it even if you start to feel better. SEEK MEDICAL CARE IF:   The glands in your neck continue to enlarge.  You develop a rash, cough, or earache.  You cough up green, yellow-brown, or bloody sputum.  You have pain or discomfort not controlled by medicines.  Your problems seem to be getting worse rather than better.  You have a fever. SEEK IMMEDIATE MEDICAL CARE IF:   You develop any new symptoms such as vomiting, severe headache, stiff or painful neck, chest pain, shortness of breath, or trouble swallowing.  You develop severe throat pain, drooling, or changes in your voice.  You develop swelling of the neck, or the skin on the neck becomes red and tender.  You develop signs of  dehydration, such as fatigue, dry mouth, and decreased urination.  You become increasingly sleepy, or you cannot wake up completely. MAKE SURE YOU:  Understand these instructions.  Will watch your condition.  Will get help right away if you are not doing well or get worse. Document Released: 01/30/2000 Document Revised: 06/18/2013 Document Reviewed: 04/02/2010 [facility] Patient Information 2015 [place], [place]. This information is not intended to replace advice given to you by your health care provider. Make sure you discuss any questions you have with your health care provider.

## 2014-02-06 NOTE — ED Provider Notes (Signed)
CSN: [id number]     Arrival date & time 02/06/14  1946 History   First MD Initiated Contact with Patient 02/06/14 1959     Chief Complaint  Patient presents with  . Sore Throat  . Pneumonia     (Consider location/radiation/quality/duration/timing/severity/associated sxs/prior Treatment) The history is provided by the patient and medical records. No language interpreter was used.     PLUMA DINIZ is a 23 y.o. female  with a hx of asthma presents to the Emergency Department complaining of gradual, persistent, progressively worsening general illness including sore throat and decreased appetite onset several days ago, but worse today.  Pt reports she was Dx with strep throat and PNA yesterday and given abx.  Pt reports her throat hurts too much to swallow and she is not eating or drinking, but is taking her antibiotics.  Patient reports that she did not get a shot of penicillin yesterday as she requested which would have made her feel better. She reports she has not attempted to eat or drink anything and she left the emergency department yesterday.  She reports that she has not attempted to take her naproxen as this will not help her.  She reports persistent fever but has not attempted to treat this either.  She denies neck pain, neck stiffness, chest pain, shortness of breath, abdominal pain, nausea, vomiting, diarrhea weakness dizziness, syncope, dysuria, hematuria.  Past Medical History  Diagnosis Date  . Asthma   . NVD (normal vaginal delivery) 08/30/2010  . Hx MRSA infection 2005  . Trichimoniasis   . Chlamydia   . Gonorrhea    Past Surgical History  Procedure Laterality Date  . No past surgeries    . Multiple tooth extractions     Family History  Problem Relation Age of Onset  . Anesthesia problems Neg Hx   . Other Neg Hx   . Diabetes Maternal Grandmother   . Hypertension Father   . Heart disease Father   . Lung disease Father    History  Substance Use Topics  .  Smoking status: Former Smoker -- 0.25 packs/day for 5 years    Types: Cigarettes    Quit date: 12/27/2012  . Smokeless tobacco: Never Used  . Alcohol Use: No   OB History    Gravida Para Term Preterm AB TAB SAB Ectopic Multiple Living   4 4 4       4      Review of Systems  Constitutional: Positive for fever, chills and fatigue. Negative for diaphoresis, appetite change and unexpected weight change.  HENT: Positive for sore throat and trouble swallowing. Negative for mouth sores.   Eyes: Negative for visual disturbance.  Respiratory: Negative for cough, chest tightness, shortness of breath and wheezing.   Cardiovascular: Negative for chest pain.  Gastrointestinal: Negative for nausea, vomiting, abdominal pain, diarrhea and constipation.  Endocrine: Negative for polydipsia, polyphagia and polyuria.  Genitourinary: Negative for dysuria, urgency, frequency and hematuria.  Musculoskeletal: Negative for back pain and neck stiffness.  Skin: Negative for rash.  Allergic/Immunologic: Negative for immunocompromised state.  Neurological: Negative for syncope, light-headedness and headaches.  Hematological: Does not bruise/bleed easily.  Psychiatric/Behavioral: Negative for sleep disturbance. The patient is not nervous/anxious.       Allergies  Iodine and Sulfonamide derivatives  Home Medications   Prior to Admission medications   Medication Sig Start Date End Date Taking? Authorizing Provider  albuterol (PROVENTIL HFA;VENTOLIN HFA) 108 (90 BASE) MCG/ACT inhaler Inhale 2 puffs into the lungs every  6 (six) hours as needed for wheezing or shortness of breath (wheezing & shortness of breath).    Yes Historical Provider, MD  azithromycin (ZITHROMAX) 250 MG tablet Take 1 tablet (250 mg total) by mouth daily. Take first 2 tablets together, then 1 every day until finished. 02/05/14  Yes Harle BattiestElizabeth Tysinger, NP  medroxyPROGESTERone (DEPO-PROVERA) 150 MG/ML injection Inject 1 mL (150 mg total) into  the muscle every 3 (three) months. 09/06/13  Yes Loney LaurenceMichelle A Horvath, MD  naproxen (NAPROSYN) 500 MG tablet Take 1 tablet (500 mg total) by mouth 2 (two) times daily with a meal. 02/05/14  Yes Harle BattiestElizabeth Tysinger, NP  clindamycin (CLEOCIN) 300 MG capsule Take 1 capsule (300 mg total) by mouth 4 (four) times daily. X 7 days Patient not taking: Reported on 02/05/2014 12/21/13   Donnita FallsMercedes Strupp Camprubi-Soms, PA-C  HYDROcodone-acetaminophen (NORCO) 5-325 MG per tablet Take 1-2 tablets by mouth every 6 (six) hours as needed for severe pain. Patient not taking: Reported on 02/05/2014 12/21/13   Donnita FallsMercedes Strupp Camprubi-Soms, PA-C  HYDROcodone-ibuprofen (VICOPROFEN) 7.5-200 MG per tablet Take 1 tablet by mouth every 6 (six) hours as needed for moderate pain. 02/06/14   Lashay Osborne, PA-C  hydrOXYzine (ATARAX/VISTARIL) 25 MG tablet Take 1 tablet (25 mg total) by mouth every 6 (six) hours as needed for itching. Patient not taking: Reported on 02/05/2014 12/21/13   Donnita FallsMercedes Strupp Camprubi-Soms, PA-C  naproxen (NAPROSYN) 500 MG tablet Take 1 tablet (500 mg total) by mouth 2 (two) times daily as needed for mild pain, moderate pain or headache (TAKE WITH MEALS.). Patient not taking: Reported on 02/05/2014 12/21/13   Donnita FallsMercedes Strupp Camprubi-Soms, PA-C  predniSONE (DELTASONE) 20 MG tablet Take 2 tablets (40 mg total) by mouth daily. Patient not taking: Reported on 02/05/2014 12/10/13   Raeford RazorStephen Kohut, MD   BP 113/74 mmHg  Pulse 81  Temp(Src) 98.7 F (37.1 C) (Oral)  Resp 16  SpO2 97%  LMP 12/30/2013 Physical Exam  Constitutional: She is oriented to person, place, and time. She appears well-developed and well-nourished. No distress.  HENT:  Head: Normocephalic and atraumatic.  Right Ear: Tympanic membrane, external ear and ear canal normal.  Left Ear: Tympanic membrane, external ear and ear canal normal.  Nose: Nose normal. No mucosal edema or rhinorrhea. No epistaxis. Right sinus exhibits no maxillary  sinus tenderness and no frontal sinus tenderness. Left sinus exhibits no maxillary sinus tenderness and no frontal sinus tenderness.  Mouth/Throat: Uvula is midline and mucous membranes are normal. Mucous membranes are not pale, not dry and not cyanotic. No trismus in the jaw. No uvula swelling. Oropharyngeal exudate, posterior oropharyngeal edema and posterior oropharyngeal erythema present. No tonsillar abscesses.  Posterior oropharynx with erythema, edema and exudate on the tonsils  Eyes: Conjunctivae are normal. Pupils are equal, round, and reactive to light.  Neck: Normal range of motion, full passive range of motion without pain and phonation normal. No tracheal tenderness, no spinous process tenderness and no muscular tenderness present. No rigidity. No erythema and normal range of motion present. No Brudzinski's sign and no Kernig's sign noted.  Range of motion without pain no No midline or paraspinal tenderness Normal phonation No stridor Handling secretions without difficulty No nuchal rigidity or meningeal signs  Cardiovascular: Normal rate, regular rhythm, normal heart sounds and intact distal pulses.   Pulses:      Radial pulses are 2+ on the right side, and 2+ on the left side.  Pulmonary/Chest: Effort normal and breath sounds normal. No stridor. No  respiratory distress. She has no decreased breath sounds. She has no wheezes.  Equal chest expansion, coarse, but equal breath sounds   Abdominal: Soft. Bowel sounds are normal. There is no tenderness.  Musculoskeletal: Normal range of motion.  Lymphadenopathy:       Head (right side): Submandibular and tonsillar adenopathy present. No submental, no preauricular, no posterior auricular and no occipital adenopathy present.       Head (left side): Submandibular and tonsillar adenopathy present. No submental, no preauricular, no posterior auricular and no occipital adenopathy present.    She has cervical adenopathy (anterior).       Right  cervical: No superficial cervical, no deep cervical and no posterior cervical adenopathy present.      Left cervical: No superficial cervical, no deep cervical and no posterior cervical adenopathy present.  Neurological: She is alert and oriented to person, place, and time. She exhibits normal muscle tone. Coordination normal.  Alert and oriented Moves all extremities without ataxia  Skin: Skin is warm and dry. No rash noted. She is not diaphoretic. No erythema.  Psychiatric: She has a normal mood and affect.  Nursing note and vitals reviewed.   ED Course  Procedures (including critical care time) Labs Review Labs Reviewed - No data to display  Imaging Review Dg Chest 2 View  02/05/2014   CLINICAL DATA:  Cough  EXAM: CHEST  2 VIEW  COMPARISON:  December 21, 2013 chest radiograph as well as CT abdomen and pelvis including the lung bases February 05, 2014.  FINDINGS: There is patchy infiltrate in the right middle lobe. Lungs elsewhere are clear. The heart size and pulmonary vascularity are normal. No adenopathy. No bone lesions.  IMPRESSION: Patchy infiltrate right middle lobe   Electronically Signed   By: Bretta BangWilliam  Woodruff M.D.   On: 02/05/2014 16:13   Ct Renal Stone Study  02/05/2014   CLINICAL DATA:  23 year old female with right side and lateral abdominal pain for 2-3 days, similar ppm prior episodes in the past. Initial encounter.  EXAM: CT ABDOMEN AND PELVIS WITHOUT CONTRAST  TECHNIQUE: Multidetector CT imaging of the abdomen and pelvis was performed following the standard protocol without IV contrast.  COMPARISON:  Ob ultrasound 09/01/2013 and earlier. Chest radiographs 12/21/2013.  FINDINGS: Mild respiratory motion artifact at the lung bases. There is peribronchial thickening and patchy peribronchovascular opacity in the right middle lobe (series 6, image 3). There is also a lingula peribronchial thickening. No pleural or pericardial effusion.  No osseous abnormality identified.  No pelvic  free fluid. Diminutive bladder. Negative non contrast uterus and adnexa.  Redundant but otherwise negative distal colon. Retained stool throughout the colon. Left colon, transverse colon, right colon, and appendix are normal. Negative terminal ileum. No dilated small bowel. Decompressed stomach and duodenum.  Noncontrast liver, gallbladder, spleen, pancreas, and adrenal glands are within normal limits.  No nephrolithiasis or perinephric stranding. No hydronephrosis or hydroureter. No periureteral stranding. No abdominal free fluid.  IMPRESSION: 1. No urologic calculus or obstructive uropathy. No acute or inflammatory findings identified. Normal appendix. 2. Inflammatory changes in the right middle lobe, and to a lesser extent the lingula. Query acute or recent bronchopneumonia.   Electronically Signed   By: Augusto GambleLee  Hall M.D.   On: 02/05/2014 13:49     EKG Interpretation None      MDM   Final diagnoses:  Bacterial pharyngitis  Community acquired pneumonia  Dehydration  Fever, unspecified fever cause   Brittany [name] presents with persistent sore throat  after being diagnosed with strep throat and pneumonia yesterday. She reports she has had nothing to eat or drink since she left the ER yesterday has taken antibiotics but has not attempted to take her anti-inflammatory take anything else for her fever.  Since sitting on the bed, well appearing with normal phonation. She is spitting into a bag stating that she cannot swallow however immediately asked for something to drink and is able to drink juice without difficulty.  We'll give symptom control here in the emergency department and fluids as patient is tachycardic and likely dehydrated. Will reassess.  9:50 PM Patient reports she has some better. She has been able to tolerate 4 cartons of juice without difficulty.  Patient's fever is decreasing.  Patient will be discharged home with symptomatic treatment including pain medication. She's been  treated here with penicillin, Decadron and continues to drink fluids. Patient with resolved tachycardia and improved fevers.  No evidence of spread of infection, peritonsillar abscess or other concerning symptoms.  I have personally reviewed patient's vitals, nursing note and any pertinent labs or imaging.  I performed an undressed physical exam.    It has been determined that no acute conditions requiring further emergency intervention are present at this time. The patient/guardian have been advised of the diagnosis and plan. I reviewed all labs and imaging including any potential incidental findings. We have discussed signs and symptoms that warrant return to the ED and they are listed in the discharge instructions.    Vital signs are stable at discharge.   BP 113/74 mmHg  Pulse 81  Temp(Src) 98.7 F (37.1 C) (Oral)  Resp 16  SpO2 97%  LMP 12/30/2013        Dahlia Client Carlis Blanchard, PA-C 02/07/14 0142  Gilda Crease, MD 02/09/14 [phone]

## 2014-02-28 ENCOUNTER — Emergency Department (HOSPITAL_COMMUNITY)
Admission: EM | Admit: 2014-02-28 | Discharge: 2014-02-28 | Disposition: A | Discharge status: Home / Self Care | Payer: Medicaid Other | Attending: Emergency Medicine | Admitting: Emergency Medicine

## 2014-02-28 ENCOUNTER — Encounter (HOSPITAL_COMMUNITY): Payer: Self-pay | Admitting: Emergency Medicine

## 2014-02-28 DIAGNOSIS — R21 Rash and other nonspecific skin eruption: Secondary | ICD-10-CM | POA: Insufficient documentation

## 2014-02-28 DIAGNOSIS — Z8619 Personal history of other infectious and parasitic diseases: Secondary | ICD-10-CM | POA: Diagnosis not present

## 2014-02-28 DIAGNOSIS — Z792 Long term (current) use of antibiotics: Secondary | ICD-10-CM | POA: Diagnosis not present

## 2014-02-28 DIAGNOSIS — Z8614 Personal history of Methicillin resistant Staphylococcus aureus infection: Secondary | ICD-10-CM | POA: Diagnosis not present

## 2014-02-28 DIAGNOSIS — Z79899 Other long term (current) drug therapy: Secondary | ICD-10-CM | POA: Insufficient documentation

## 2014-02-28 DIAGNOSIS — Z7952 Long term (current) use of systemic steroids: Secondary | ICD-10-CM | POA: Insufficient documentation

## 2014-02-28 DIAGNOSIS — J45909 Unspecified asthma, uncomplicated: Secondary | ICD-10-CM | POA: Diagnosis not present

## 2014-02-28 DIAGNOSIS — Z87891 Personal history of nicotine dependence: Secondary | ICD-10-CM | POA: Diagnosis not present

## 2014-02-28 DIAGNOSIS — Z791 Long term (current) use of non-steroidal anti-inflammatories (NSAID): Secondary | ICD-10-CM | POA: Insufficient documentation

## 2014-02-28 MED ORDER — BETAMETHASONE DIPROPIONATE 0.05 % EX OINT: TOPICAL_OINTMENT | Freq: Two times a day (BID) | CUTANEOUS | Status: DC

## 2014-02-28 NOTE — ED Notes (Signed)
Pt states last time she was here she was diagnosed with pneumonia and strep throat  Pt states she was given a penicillin shot  Pt states when she got home she started developing a red rash  Pt states she also had to use tide pods to wash her work clothes   Pt states she has had the rash since then  Pt states it is on her arms and legs mainly but some on her truck as well

## 2014-02-28 NOTE — Discharge Instructions (Signed)
Psoriasis Psoriasis is a common, long-lasting (chronic) inflammation of the skin. It affects both men and women equally, of all ages and all races. Psoriasis cannot be passed from person to person (not contagious). Psoriasis varies from mild to very severe. When severe, it can greatly affect your quality of life. Psoriasis is an inflammatory disorder affecting the skin as well as other organs including the joints (causing an arthritis). With psoriasis, the skin sheds its top layer of cells more rapidly than it does in someone without psoriasis. CAUSES  The cause of psoriasis is largely unknown. Genetics, your immune system, and the environment seem to play a role in causing psoriasis. Factors that can make psoriasis worse include:  Damage or trauma to the skin, such as cuts, scrapes, and sunburn. This damage often causes new areas of psoriasis (lesions).  Winter dryness and lack of sunlight.  Medicines such as lithium, beta-blockers, antimalarial drugs, ACE inhibitors, nonsteroidal anti-inflammatory drugs (ibuprofen, aspirin), and terbinafine. Let your caregiver know if you are taking any of these drugs.  Alcohol. Excessive alcohol use should be avoided if you have psoriasis. Drinking large amounts of alcohol can affect:  How well your psoriasis treatment works.  How safe your psoriasis treatment is.  Smoking. If you smoke, ask your caregiver for help to quit.  Stress.  Bacterial or viral infections.  Arthritis. Arthritis associated with psoriasis (psoriatic arthritis) affects less than 10% of patients with psoriasis. The arthritic intensity does not always match the skin psoriasis intensity. It is important to let your caregiver know if your joints hurt or if they are stiff. SYMPTOMS  The most common form of psoriasis begins with little red bumps that gradually become larger. The bumps begin to form scales that flake off easily. The lower layers of scales stick together. When these scales  are scratched or removed, the underlying skin is tender and bleeds easily. These areas then grow in size and may become large. Psoriasis often creates a rash that looks the same on both sides of the body (symmetrical). It often affects the elbows, knees, groin, genitals, arms, legs, scalp, and nails. Affected nails often have pitting, loosen, thicken, crumble, and are difficult to treat.  "Inverse psoriasis"occurs in the armpits, under breasts, in skin folds, and around the groin, buttocks, and genitals.  "Guttate psoriasis" generally occurs in children and young adults following a recent sore throat (strep throat). It begins with many small, red, scaly spots on the skin. It clears spontaneously in weeks or a few months without treatment. DIAGNOSIS  Psoriasis is diagnosed by physical exam. A tissue sample (biopsy) may also be taken. TREATMENT The treatment of psoriasis depends on your age, health, and living conditions.  Steroid (cortisone) creams, lotions, and ointments may be used. These treatments are associated with thinning of the skin, blood vessels that get larger (dilated), loss of skin pigmentation, and easy bruising. It is important to use these steroids as directed by your caregiver. Only treat the affected areas and not the normal, unaffected skin. People on long-term steroid treatment should wear a medical alert bracelet. Injections may be used in areas that are difficult to treat.  Scalp treatments are available as shampoos, solutions, sprays, foams, and oils. Avoid scratching the scalp and picking at the scales.  Anthralin medicine works well on areas that are difficult to treat. However, it stains clothes and skin and may cause temporary irritation.  Synthetic vitamin D (calcipotriene)can be used on small areas. It is available by prescription. The forms   of synthetic vitamin D available in health food stores do not help with psoriasis.  Coal tarsare available in various strengths  for psoriasis that is difficult to treat. They are one of the longest used treatments for difficult to treat psoriasis. However, they are messy to use.  Light therapy (UV therapy) can be carefully and professionally monitored in a dermatologist's office. Careful sunbathing is helpful for many people as directed by your caregiver. The exposure should be just long enough to cause a mild redness (erythema) of your skin. Avoid sunburn as this may make the condition worse. Sunscreen (SPF of 30 or higher) should be used to protect against sunburn. Cataracts, wrinkles, and skin aging are some of the harmful side effects of light therapy.  If creams (topical medicines) fail, there are several other options for systemic or oral medicines your caregiver can suggest. Psoriasis can sometimes be very difficult to treat. It can come and go. It is necessary to follow up with your caregiver regularly if your psoriasis is difficult to treat. Usually, with persistence you can get a good amount of relief. Maintaining consistent care is important. Do not change caregivers just because you do not see immediate results. It may take several trials to find the right combination of treatment for you. PREVENTING FLARE-UPS  Wear gloves while you wash dishes, while cleaning, and when you are outside in the cold.  If you have radiators, place a bowl of water or damp towel on the radiator. This will help put water back in the air. You can also use a humidifier to keep the air moist. Try to keep the humidity at about 60% in your home.  Apply moisturizer while your skin is still damp from bathing or showering. This traps water in the skin.  Avoid long, hot baths or showers. Keep soap use to a minimum. Soaps dry out the skin and wash away the protective oils. Use a fragrance free, dye free soap.  Drink enough water and fluids to keep your urine clear or pale yellow. Not drinking enough water depletes your skin's water  supply.  Turn off the heat at night and keep it low during the day. Cool air is less drying. SEEK MEDICAL CARE IF:  You have increasing pain in the affected areas.  You have uncontrolled bleeding in the affected areas.  You have increasing redness or warmth in the affected areas.  You start to have pain or stiffness in your joints.  You start feeling depressed about your condition.  You have a fever. Document Released: 01/30/2000 Document Revised: 04/26/2011 Document Reviewed: 07/27/2010 [employer] Patient Information 2015 [employer]. This information is not intended to replace advice given to you by your health care provider. Make sure you discuss any questions you have with your health care provider.  

## 2014-03-06 NOTE — ED Provider Notes (Signed)
CSN: [id number]     Arrival date & time 02/28/14  1933 History   First MD Initiated Contact with Patient 02/28/14 2137     Chief Complaint  Patient presents with  . Rash     (Consider location/radiation/quality/duration/timing/severity/associated sxs/prior Treatment) Patient is a 24 y.o. female presenting with rash. The history is provided by the patient. No language interpreter was used.  Rash Associated symptoms: no fever   Associated symptoms comment:  Rash that started on her hands, moving into arms and legs, now on torso. It started 3 weeks ago after being given penicillin for a strep/pneumonia infection. No SOB, difficulty swallowing. She has been using Benadryl which does not provide any relief. The rash is progressive and not transient. It begins with itching but persists with discomfort. No drainage, fever.   Past Medical History  Diagnosis Date  . Asthma   . NVD (normal vaginal delivery) 08/30/2010  . Hx MRSA infection 2005  . Trichimoniasis   . Chlamydia   . Gonorrhea    Past Surgical History  Procedure Laterality Date  . No past surgeries    . Multiple tooth extractions     Family History  Problem Relation Age of Onset  . Anesthesia problems Neg Hx   . Other Neg Hx   . Diabetes Maternal Grandmother   . Hypertension Father   . Heart disease Father   . Lung disease Father    History  Substance Use Topics  . Smoking status: Former Smoker -- 0.25 packs/day for 5 years    Types: Cigarettes    Quit date: 12/27/2012  . Smokeless tobacco: Never Used  . Alcohol Use: No   OB History    Gravida Para Term Preterm AB TAB SAB Ectopic Multiple Living   4 4 4       4      Review of Systems  Constitutional: Negative for fever and chills.  HENT: Negative.  Negative for trouble swallowing.   Respiratory: Negative.   Cardiovascular: Negative.   Skin: Positive for rash.  Neurological: Negative.       Allergies  Iodine and Sulfonamide derivatives  Home  Medications   Prior to Admission medications   Medication Sig Start Date End Date Taking? Authorizing Provider  albuterol (PROVENTIL HFA;VENTOLIN HFA) 108 (90 BASE) MCG/ACT inhaler Inhale 2 puffs into the lungs every 6 (six) hours as needed for wheezing or shortness of breath (wheezing & shortness of breath).    Yes Historical Provider, MD  Fluticasone-Salmeterol (ADVAIR) 100-50 MCG/DOSE AEPB Inhale 1 puff into the lungs 2 (two) times daily.   Yes Historical Provider, MD  azithromycin (ZITHROMAX) 250 MG tablet Take 1 tablet (250 mg total) by mouth daily. Take first 2 tablets together, then 1 every day until finished. 02/05/14   Harle BattiestElizabeth Tysinger, NP  betamethasone dipropionate (DIPROLENE) 0.05 % ointment Apply topically 2 (two) times daily. 02/28/14   Sabian Kuba A Kyion Gautier, PA-C  clindamycin (CLEOCIN) 300 MG capsule Take 1 capsule (300 mg total) by mouth 4 (four) times daily. X 7 days Patient not taking: Reported on 02/05/2014 12/21/13   Donnita FallsMercedes Strupp Camprubi-Soms, PA-C  HYDROcodone-acetaminophen (NORCO) 5-325 MG per tablet Take 1-2 tablets by mouth every 6 (six) hours as needed for severe pain. Patient not taking: Reported on 02/05/2014 12/21/13   Donnita FallsMercedes Strupp Camprubi-Soms, PA-C  HYDROcodone-ibuprofen (VICOPROFEN) 7.5-200 MG per tablet Take 1 tablet by mouth every 6 (six) hours as needed for moderate pain. 02/06/14   Hannah Muthersbaugh, PA-C  hydrOXYzine (ATARAX/VISTARIL) 25 MG  tablet Take 1 tablet (25 mg total) by mouth every 6 (six) hours as needed for itching. Patient not taking: Reported on 02/05/2014 12/21/13   Donnita Falls Camprubi-Soms, PA-C  medroxyPROGESTERone (DEPO-PROVERA) 150 MG/ML injection Inject 1 mL (150 mg total) into the muscle every 3 (three) months. 09/06/13   Loney Laurence, MD  naproxen (NAPROSYN) 500 MG tablet Take 1 tablet (500 mg total) by mouth 2 (two) times daily as needed for mild pain, moderate pain or headache (TAKE WITH MEALS.). Patient not taking: Reported on  02/05/2014 12/21/13   Donnita Falls Camprubi-Soms, PA-C  naproxen (NAPROSYN) 500 MG tablet Take 1 tablet (500 mg total) by mouth 2 (two) times daily with a meal. 02/05/14   Harle Battiest, NP  predniSONE (DELTASONE) 20 MG tablet Take 2 tablets (40 mg total) by mouth daily. Patient not taking: Reported on 02/05/2014 12/10/13   Raeford Razor, MD   BP 140/80 mmHg  Pulse 97  Temp(Src) 98.3 F (36.8 C) (Oral)  Resp 14  SpO2 100%  LMP 12/30/2013 Physical Exam  Constitutional: She is oriented to person, place, and time. She appears well-developed and well-nourished.  Neck: Normal range of motion.  Pulmonary/Chest: Effort normal.  Neurological: She is alert and oriented to person, place, and time.  Skin: Skin is warm and dry.  Rash that consists of plaques that are hyperpigmented with a white scaling appearance. It is most concentrated on her dorsal hands and in round singular, sparsely distributed lesions on arms with new, smaller less frequent lesion to abdomen and back.     ED Course  Procedures (including critical care time) Labs Review Labs Reviewed - No data to display  Imaging Review No results found.   EKG Interpretation None      MDM   Final diagnoses:  Rash and nonspecific skin eruption    DDX: drug rash, less likely due to duration of symptoms vs psoriasis, questionable given distribution (less on extensor surfaces) and no history. Will provide Rx for topical steroids and refer to dermatology.    Arnoldo Hooker, PA-C 03/06/14 2027  Candyce Churn III, MD 03/07/14 [id number]

## 2014-03-21 ENCOUNTER — Emergency Department (HOSPITAL_COMMUNITY): Payer: Medicaid Other

## 2014-03-21 ENCOUNTER — Emergency Department (HOSPITAL_COMMUNITY)
Admission: EM | Admit: 2014-03-21 | Discharge: 2014-03-21 | Disposition: A | Discharge status: Home / Self Care | Payer: Medicaid Other | Attending: Emergency Medicine | Admitting: Emergency Medicine

## 2014-03-21 ENCOUNTER — Encounter (HOSPITAL_COMMUNITY): Payer: Self-pay | Admitting: Emergency Medicine

## 2014-03-21 DIAGNOSIS — Z792 Long term (current) use of antibiotics: Secondary | ICD-10-CM | POA: Diagnosis not present

## 2014-03-21 DIAGNOSIS — S7001XA Contusion of right hip, initial encounter: Secondary | ICD-10-CM | POA: Diagnosis not present

## 2014-03-21 DIAGNOSIS — J189 Pneumonia, unspecified organism: Secondary | ICD-10-CM

## 2014-03-21 DIAGNOSIS — Z8614 Personal history of Methicillin resistant Staphylococcus aureus infection: Secondary | ICD-10-CM | POA: Insufficient documentation

## 2014-03-21 DIAGNOSIS — Y998 Other external cause status: Secondary | ICD-10-CM | POA: Diagnosis not present

## 2014-03-21 DIAGNOSIS — Z87891 Personal history of nicotine dependence: Secondary | ICD-10-CM | POA: Insufficient documentation

## 2014-03-21 DIAGNOSIS — Z79899 Other long term (current) drug therapy: Secondary | ICD-10-CM | POA: Insufficient documentation

## 2014-03-21 DIAGNOSIS — Z7952 Long term (current) use of systemic steroids: Secondary | ICD-10-CM | POA: Diagnosis not present

## 2014-03-21 DIAGNOSIS — W1839XA Other fall on same level, initial encounter: Secondary | ICD-10-CM | POA: Diagnosis not present

## 2014-03-21 DIAGNOSIS — R000 Tachycardia, unspecified: Secondary | ICD-10-CM | POA: Insufficient documentation

## 2014-03-21 DIAGNOSIS — Y9389 Activity, other specified: Secondary | ICD-10-CM | POA: Insufficient documentation

## 2014-03-21 DIAGNOSIS — J45901 Unspecified asthma with (acute) exacerbation: Secondary | ICD-10-CM | POA: Insufficient documentation

## 2014-03-21 DIAGNOSIS — J159 Unspecified bacterial pneumonia: Secondary | ICD-10-CM | POA: Insufficient documentation

## 2014-03-21 DIAGNOSIS — Z7951 Long term (current) use of inhaled steroids: Secondary | ICD-10-CM | POA: Diagnosis not present

## 2014-03-21 DIAGNOSIS — S79911A Unspecified injury of right hip, initial encounter: Secondary | ICD-10-CM | POA: Diagnosis present

## 2014-03-21 DIAGNOSIS — W19XXXA Unspecified fall, initial encounter: Secondary | ICD-10-CM

## 2014-03-21 DIAGNOSIS — Y9289 Other specified places as the place of occurrence of the external cause: Secondary | ICD-10-CM | POA: Diagnosis not present

## 2014-03-21 DIAGNOSIS — Z8619 Personal history of other infectious and parasitic diseases: Secondary | ICD-10-CM | POA: Diagnosis not present

## 2014-03-21 LAB — CBC WITH DIFFERENTIAL/PLATELET
BASOS ABS: 0 10*3/uL (ref 0.0–0.1)
Basophils Relative: 0 % (ref 0–1)
EOS PCT: 7 % — AB (ref 0–5)
Eosinophils Absolute: 0.9 10*3/uL — ABNORMAL HIGH (ref 0.0–0.7)
HCT: 34.8 % — ABNORMAL LOW (ref 36.0–46.0)
HEMOGLOBIN: 11.6 g/dL — AB (ref 12.0–15.0)
LYMPHS ABS: 1.9 10*3/uL (ref 0.7–4.0)
Lymphocytes Relative: 15 % (ref 12–46)
MCH: 26.3 pg (ref 26.0–34.0)
MCHC: 33.3 g/dL (ref 30.0–36.0)
MCV: 78.9 fL (ref 78.0–100.0)
Monocytes Absolute: 1 10*3/uL (ref 0.1–1.0)
Monocytes Relative: 8 % (ref 3–12)
Neutro Abs: 8.9 10*3/uL — ABNORMAL HIGH (ref 1.7–7.7)
Neutrophils Relative %: 70 % (ref 43–77)
Platelets: 225 10*3/uL (ref 150–400)
RBC: 4.41 MIL/uL (ref 3.87–5.11)
RDW: 15.7 % — AB (ref 11.5–15.5)
WBC: 12.7 10*3/uL — AB (ref 4.0–10.5)

## 2014-03-21 LAB — BASIC METABOLIC PANEL
Anion gap: 8 (ref 5–15)
BUN: 8 mg/dL (ref 6–23)
CO2: 22 mmol/L (ref 19–32)
CREATININE: 0.74 mg/dL (ref 0.50–1.10)
Calcium: 9.1 mg/dL (ref 8.4–10.5)
Chloride: 107 mmol/L (ref 96–112)
GFR calc non Af Amer: >90 mL/min (ref >90)
GLUCOSE: 94 mg/dL (ref 70–99)
Potassium: 3.5 mmol/L (ref 3.5–5.1)
Sodium: 137 mmol/L (ref 135–145)

## 2014-03-21 LAB — D-DIMER, QUANTITATIVE: D-Dimer, Quant: <0.27 ug/mL-FEU (ref 0.00–0.48)

## 2014-03-21 IMAGING — CR DG CHEST 2V
2 series · 2 of 2 positions shown · non-contrast
Comparison: [DATE]

CLINICAL DATA: Wheezing, productive cough, and shortness of breath
for 1 day

EXAM:
CHEST  2 VIEW

[w chest pa]
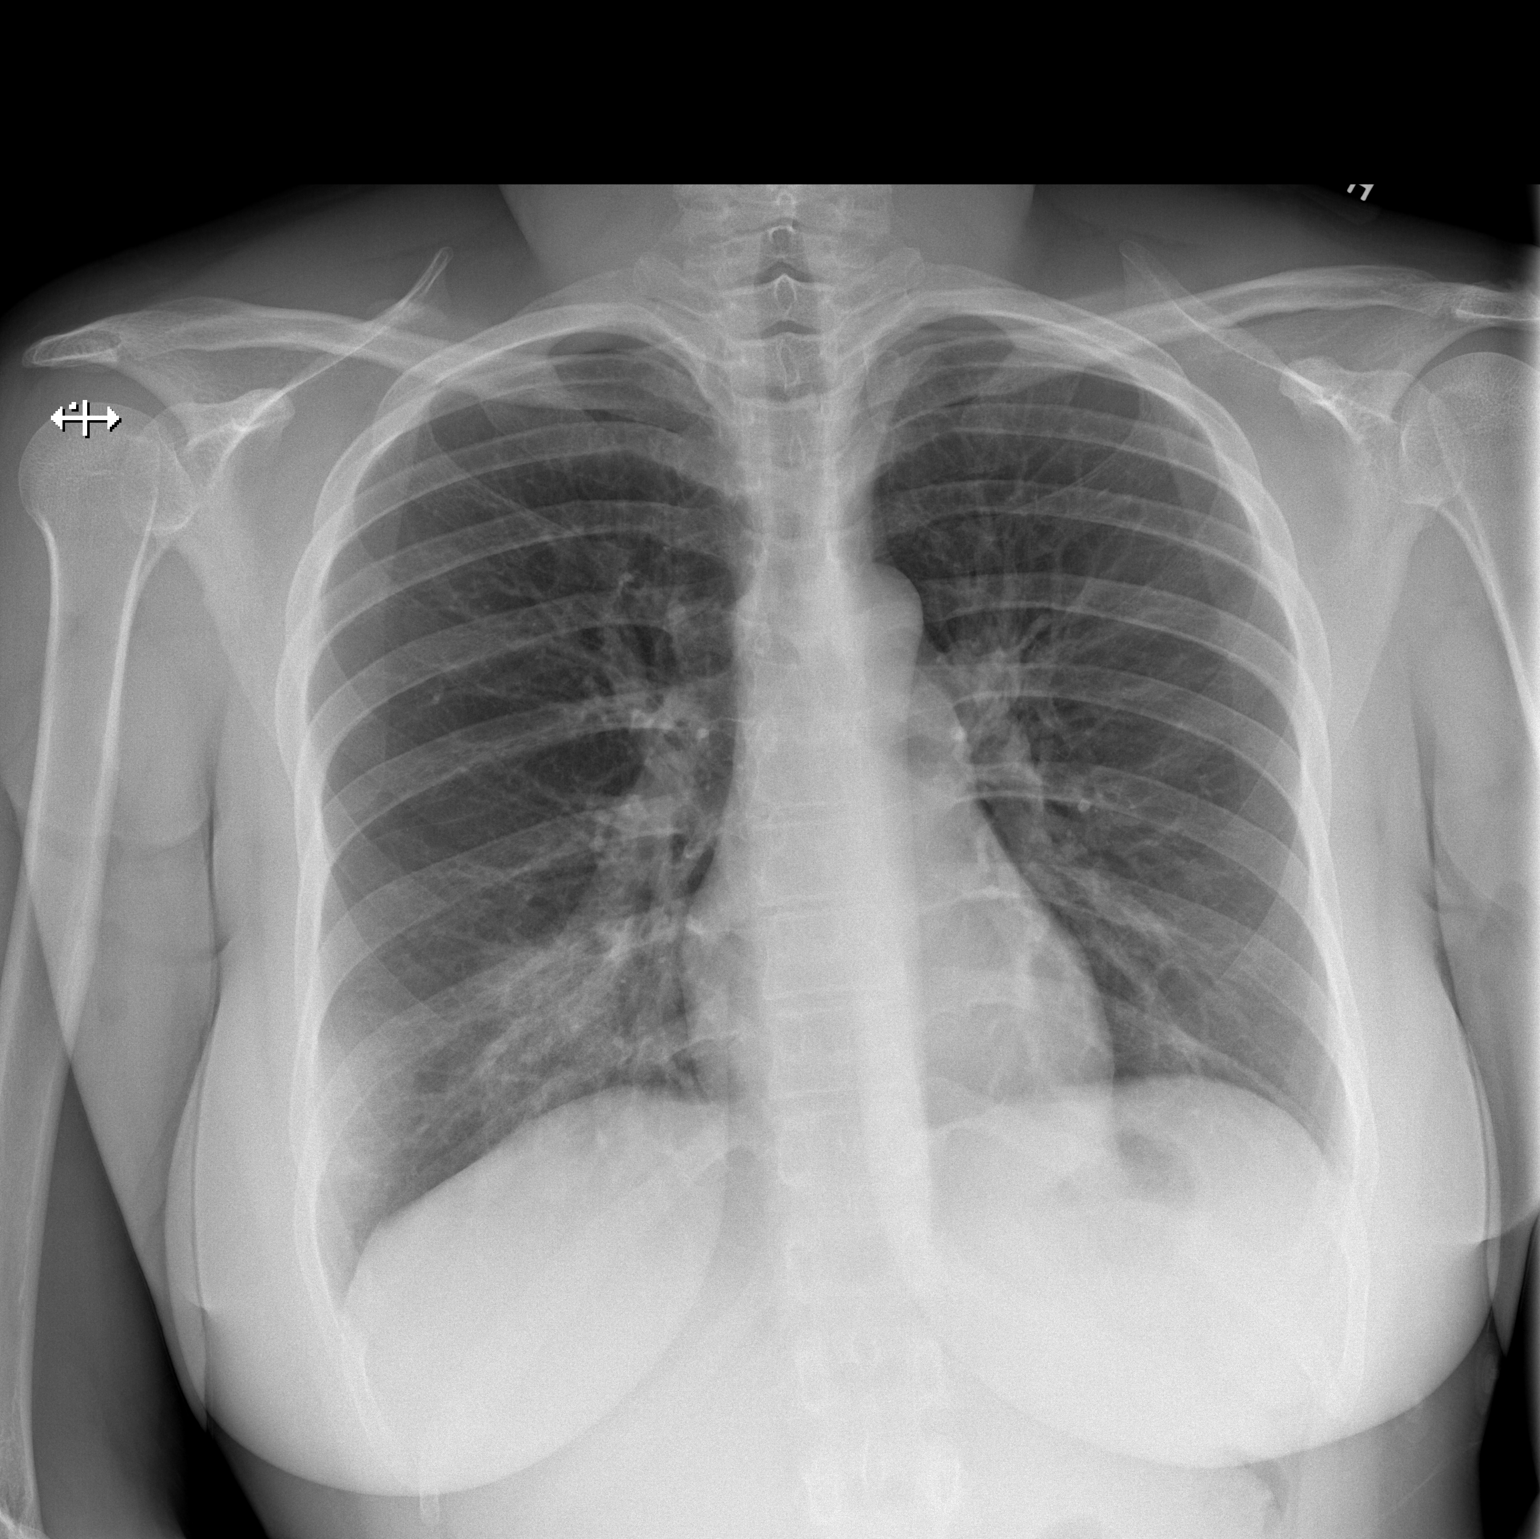

[w chest lat]
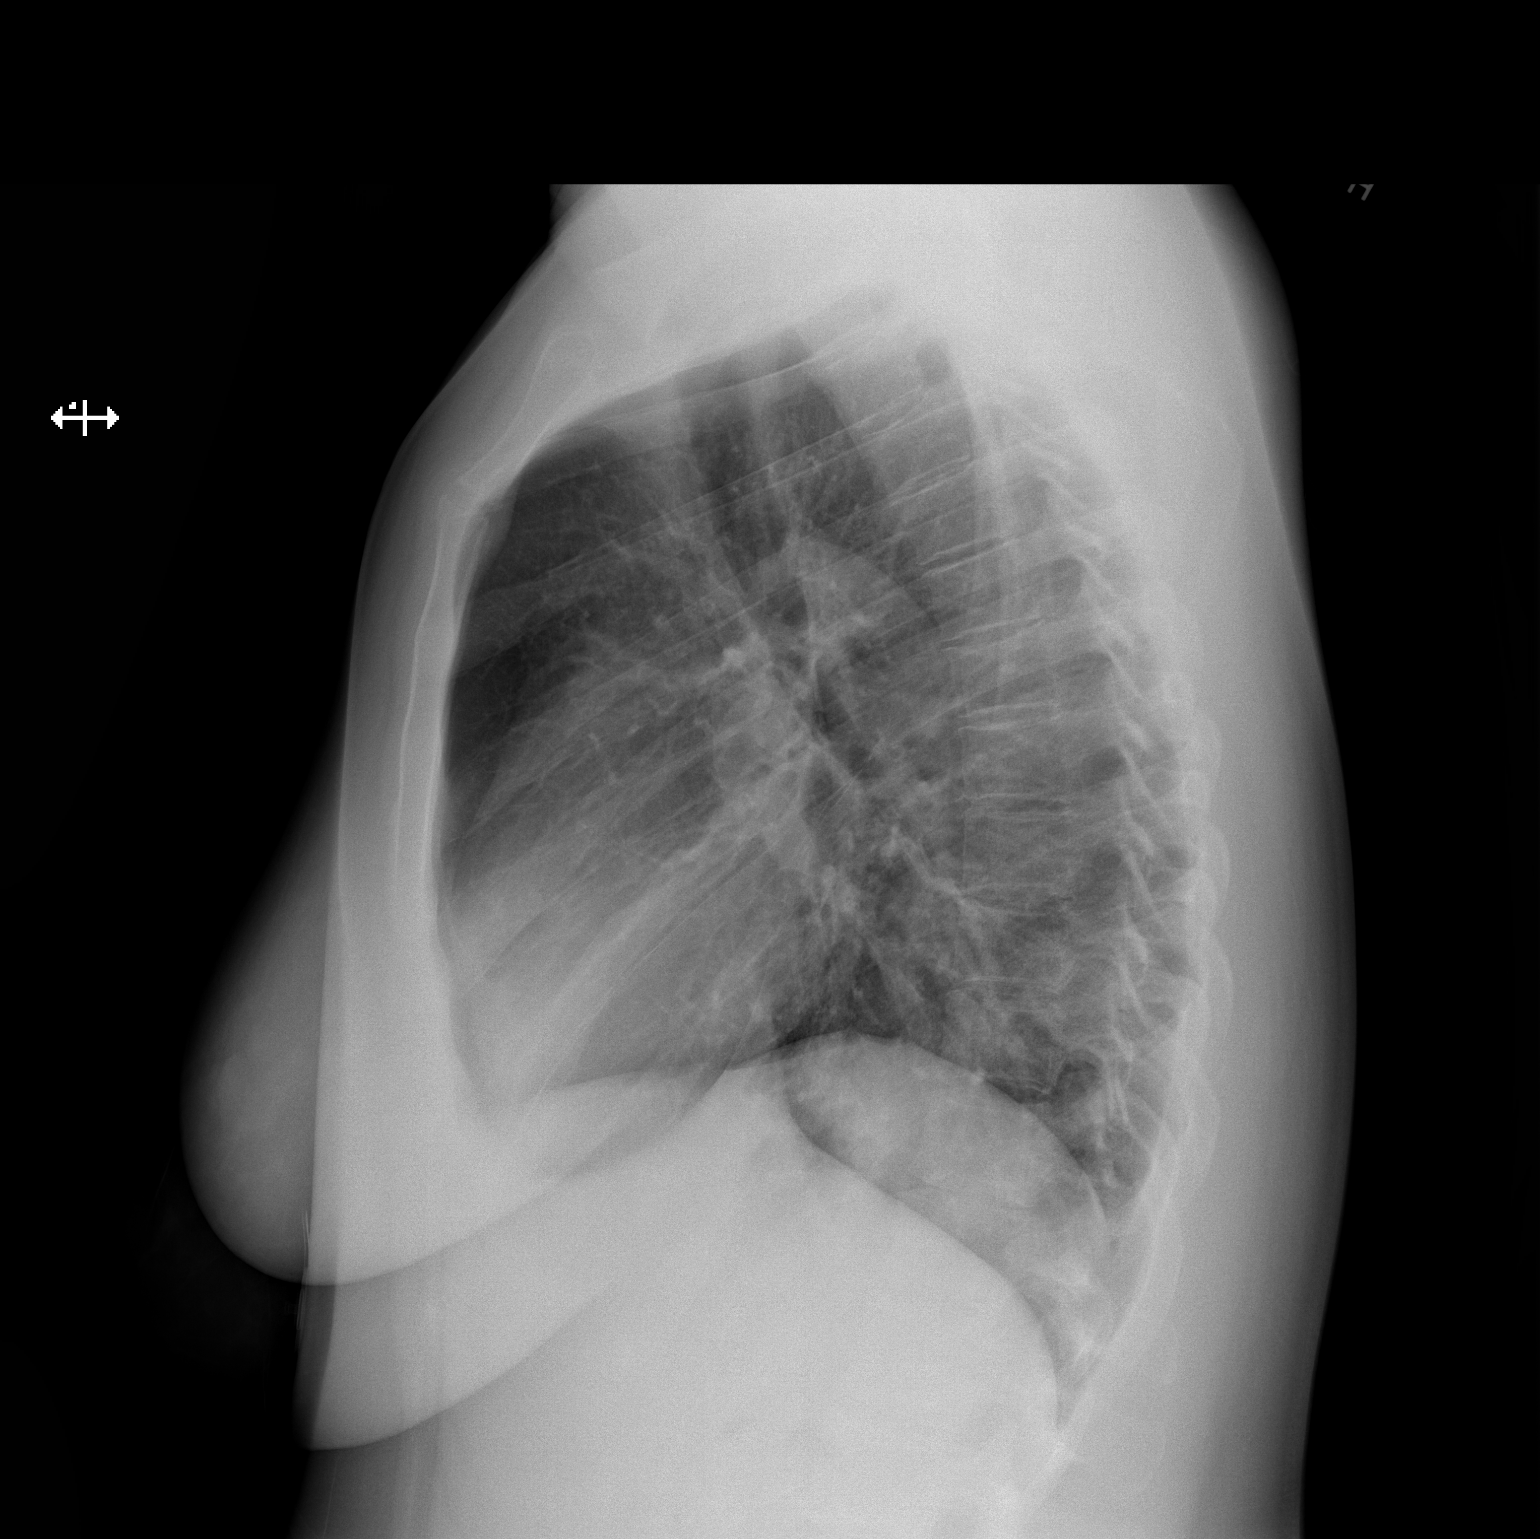

[2 of 2 positions shown; findings below may reference images not displayed]

FINDINGS: There is again noted in infiltrate in the right middle lobe,
slightly increased compared to prior study. Elsewhere, lungs are
clear. Heart size and pulmonary vascularity are normal. No
adenopathy. No bone lesions.
IMPRESSION: Right middle lobe infiltrate. There is a slightly greater degree of
infiltrate in this area compared to prior study. Question
persistence versus recurrence of pneumonia in this area. In this
regard, would advise followup study in approximately 7-10 days to
further assess. If infiltrate is persistent in this area,
consideration may wish to be given to CT or bronchoscopy to assess
for possible endobronchial abnormality leading to recurrent or
persistent infiltrate in this area. Elsewhere, lungs clear.

## 2014-03-21 IMAGING — CR DG HIP (WITH OR WITHOUT PELVIS) 2-3V*R*
3 series · 3 of 3 positions shown · non-contrast
Comparison: None.

CLINICAL DATA: Status post fall down a flight of stairs 3 days ago.
Continued right hip pain. Initial encounter.

EXAM:
RIGHT HIP (WITH PELVIS) 2-3 VIEWS

[t pelvis ap]
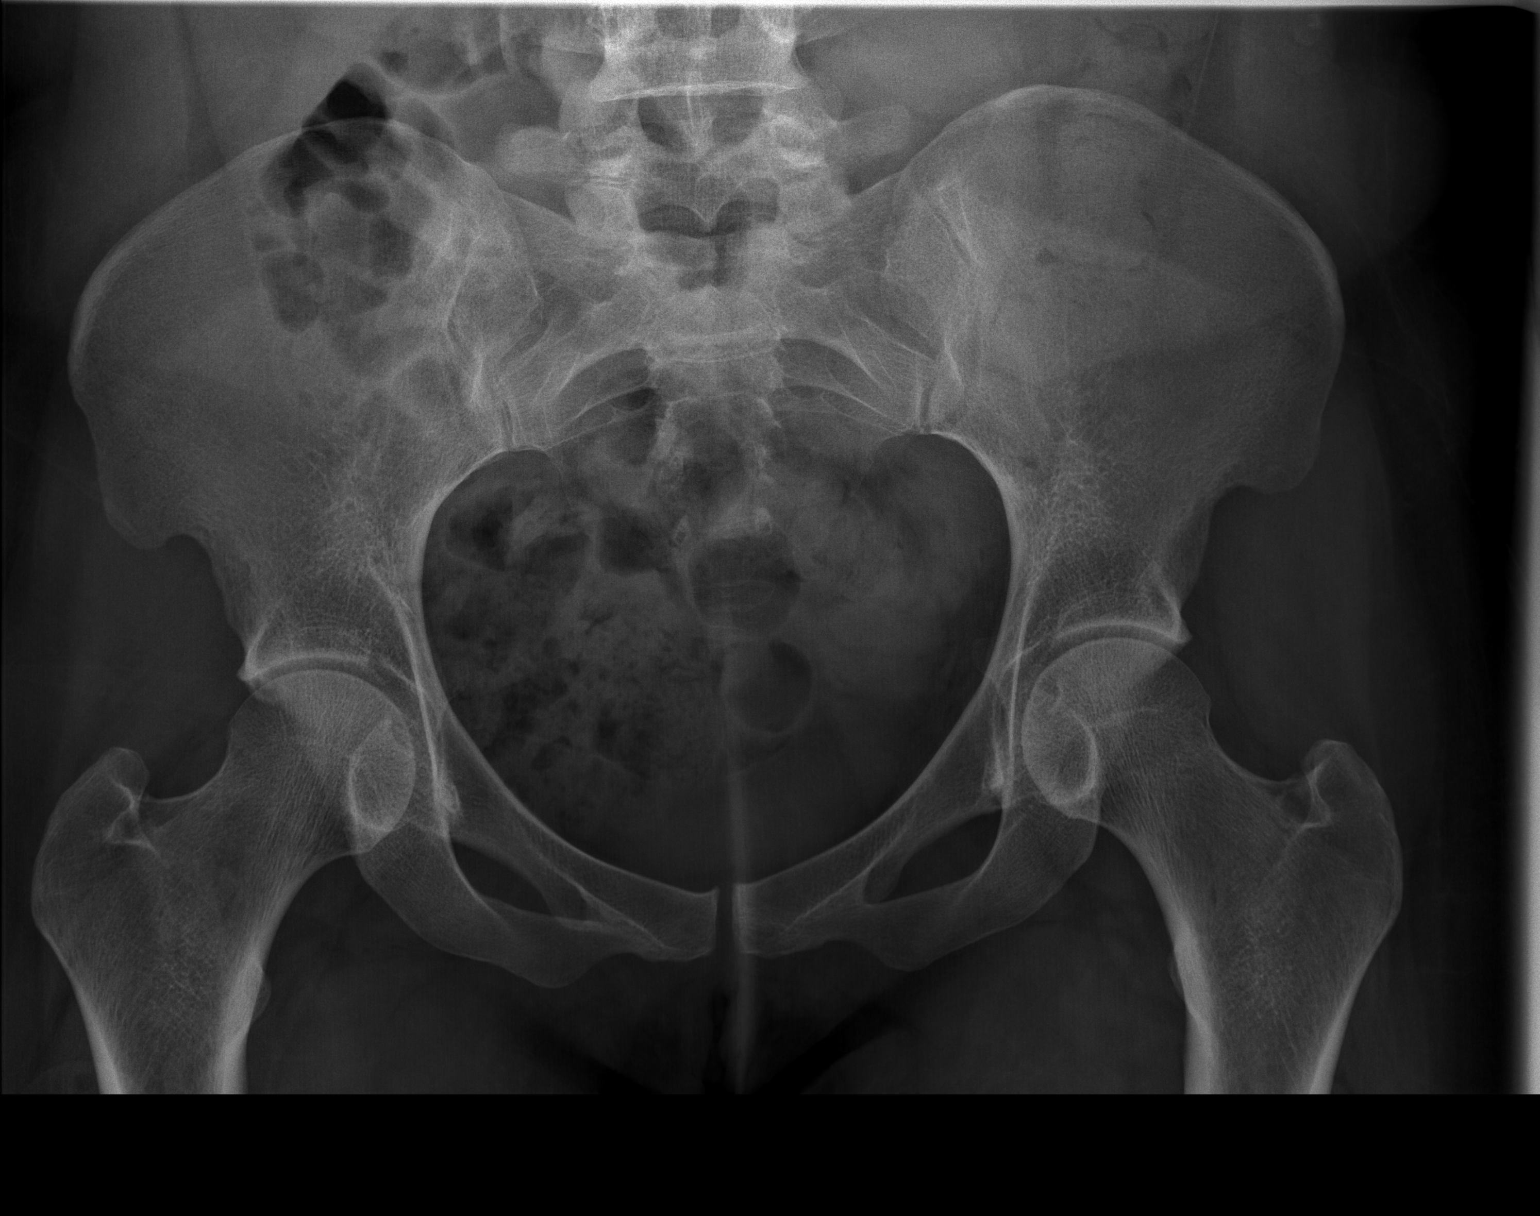

[t hip ap right]
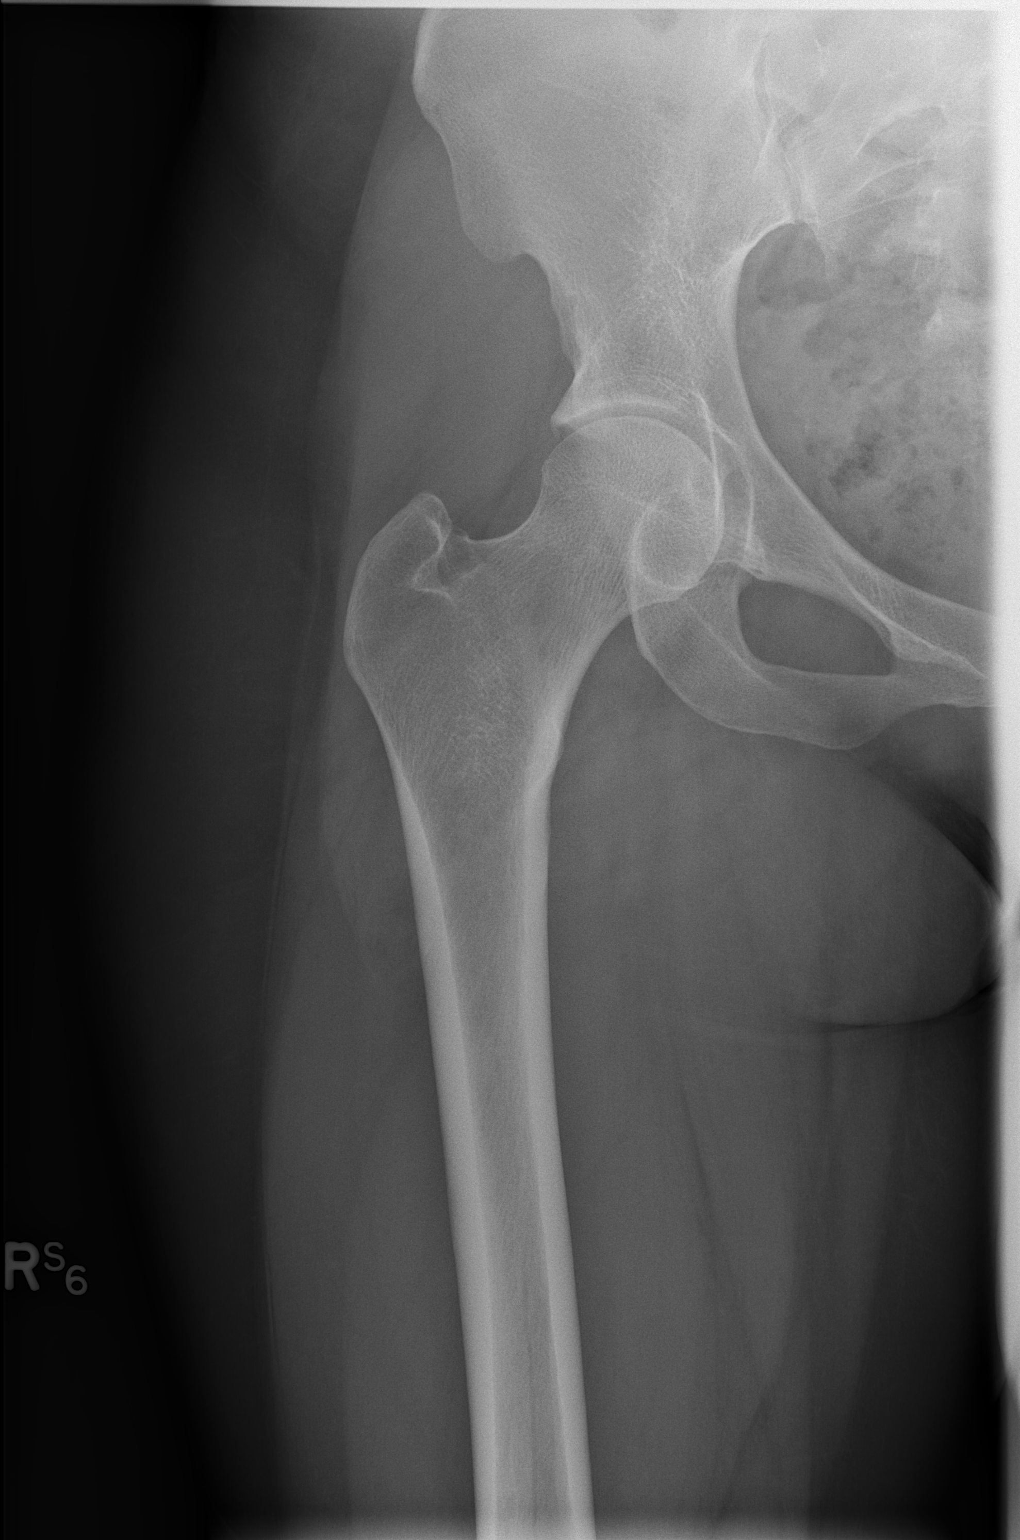

[t hip frog leg right]
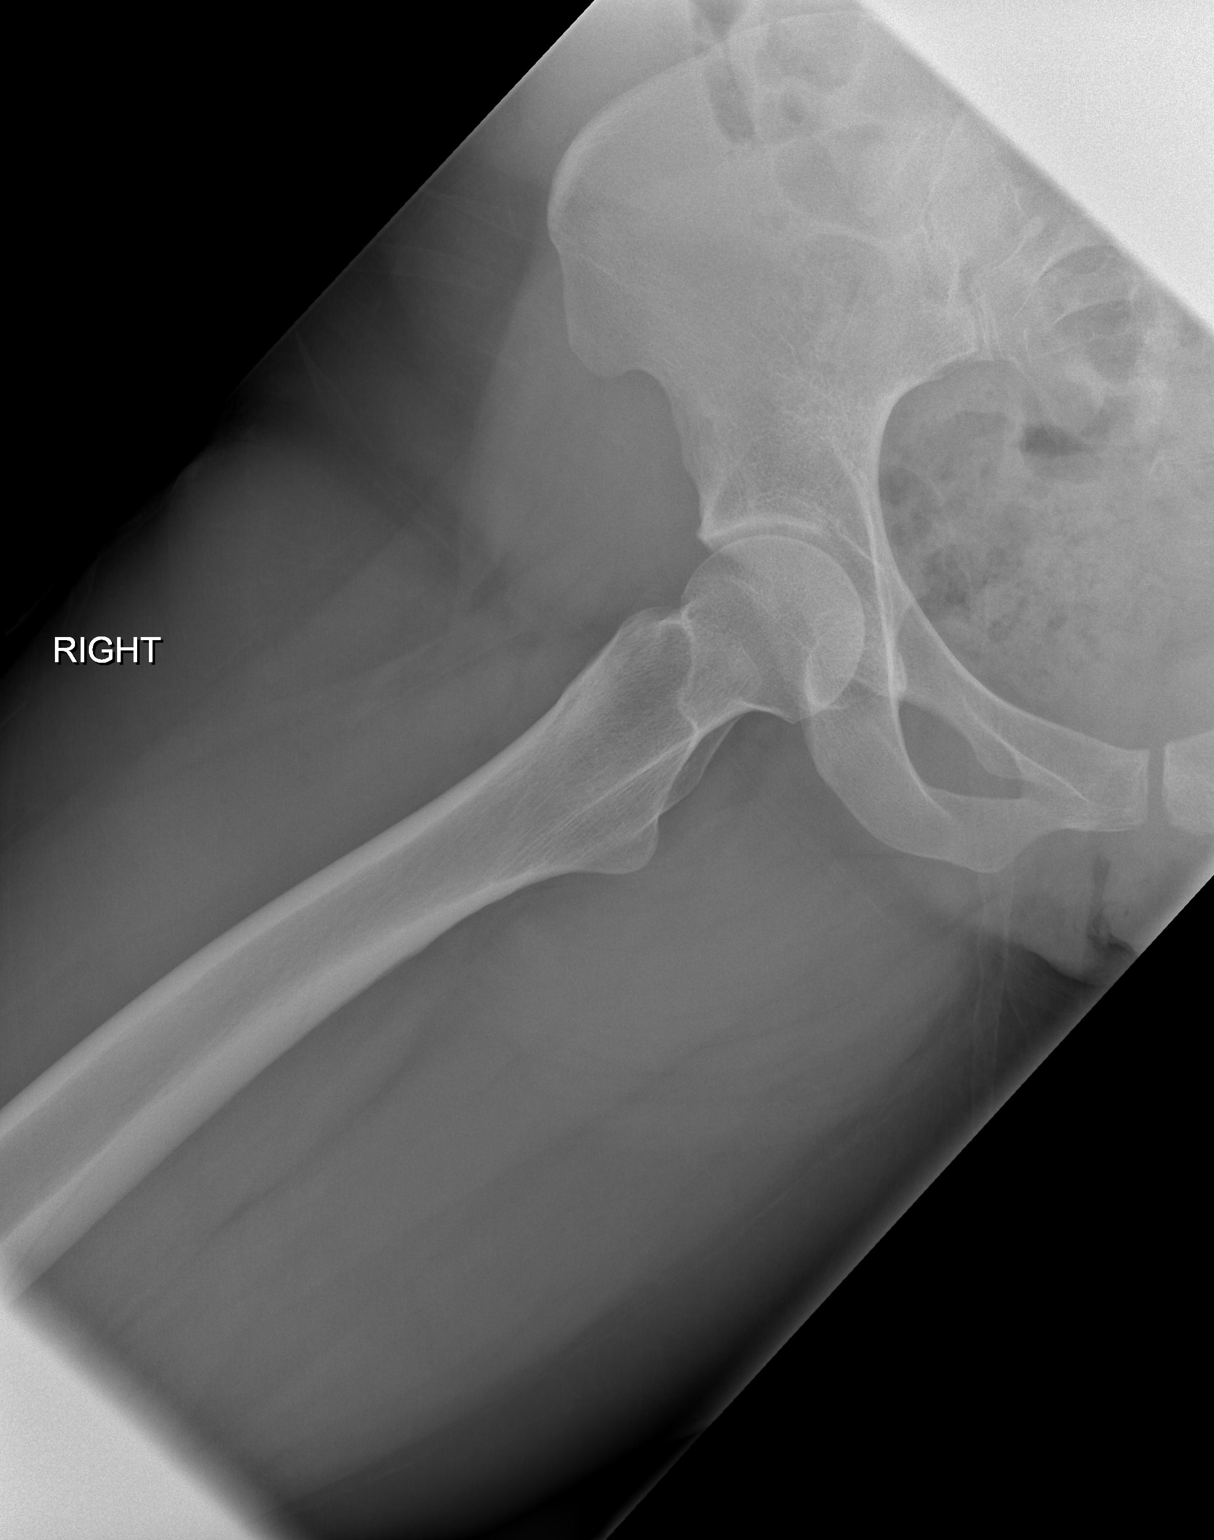

[3 of 3 positions shown; findings below may reference images not displayed]

FINDINGS: Image bones, joints and soft tissues appear normal.
IMPRESSION: Negative exam.

## 2014-03-21 MED ORDER — DEXTROSE 5 % IV SOLN
1.0000 g | Freq: Once | INTRAVENOUS | Status: AC
  Administered 2014-03-21: 1 g via INTRAVENOUS
  Filled 2014-03-21: qty 10

## 2014-03-21 MED ORDER — SODIUM CHLORIDE 0.9 % IV BOLUS (SEPSIS)
1000.0000 mL | Freq: Once | INTRAVENOUS | Status: AC
  Administered 2014-03-21: 1000 mL via INTRAVENOUS

## 2014-03-21 MED ORDER — IPRATROPIUM-ALBUTEROL 0.5-2.5 (3) MG/3ML IN SOLN
3.0000 mL | Freq: Once | RESPIRATORY_TRACT | Status: AC
  Administered 2014-03-21: 3 mL via RESPIRATORY_TRACT
  Filled 2014-03-21: qty 3

## 2014-03-21 MED ORDER — LEVOFLOXACIN 750 MG PO TABS
750.0000 mg | ORAL_TABLET | Freq: Every day | ORAL | Status: DC
  Administered 2014-03-21: 750 mg via ORAL
  Filled 2014-03-21: qty 1

## 2014-03-21 MED ORDER — PREDNISONE 10 MG PO TABS: 40.0000 mg | ORAL_TABLET | Freq: Every day | ORAL | Status: DC

## 2014-03-21 MED ORDER — LEVOFLOXACIN 750 MG PO TABS: 750.0000 mg | ORAL_TABLET | Freq: Every day | ORAL | Status: DC

## 2014-03-21 MED ORDER — ALBUTEROL SULFATE (2.5 MG/3ML) 0.083% IN NEBU
5.0000 mg | INHALATION_SOLUTION | Freq: Once | RESPIRATORY_TRACT | Status: AC
  Administered 2014-03-21: 5 mg via RESPIRATORY_TRACT
  Filled 2014-03-21: qty 6

## 2014-03-21 MED ORDER — ALBUTEROL SULFATE HFA 108 (90 BASE) MCG/ACT IN AERS: 2.0000 | INHALATION_SPRAY | RESPIRATORY_TRACT | Status: DC | PRN

## 2014-03-21 MED ORDER — OXYCODONE-ACETAMINOPHEN 5-325 MG PO TABS
1.0000 | ORAL_TABLET | Freq: Once | ORAL | Status: AC
  Administered 2014-03-21: 1 via ORAL
  Filled 2014-03-21: qty 1

## 2014-03-21 NOTE — Discharge Instructions (Signed)
It is very important that you follow up with your doctor for recheck in the next 2-3 days.  You need a repeat xray of your chest in the next week to be sure your pneumonia is improving.     Pneumonia Pneumonia is an infection of the lungs.  CAUSES Pneumonia may be caused by bacteria or a virus. Usually, these infections are caused by breathing infectious particles into the lungs (respiratory tract). SIGNS AND SYMPTOMS   Cough.  Fever.  Chest pain.  Increased rate of breathing.  Wheezing.  Mucus production. DIAGNOSIS  If you have the common symptoms of pneumonia, your health care provider will typically confirm the diagnosis with a chest X-ray. The X-ray will show an abnormality in the lung (pulmonary infiltrate) if you have pneumonia. Other tests of your blood, urine, or sputum may be done to find the specific cause of your pneumonia. Your health care provider may also do tests (blood gases or pulse oximetry) to see how well your lungs are working. TREATMENT  Some forms of pneumonia may be spread to other people when you cough or sneeze. You may be asked to wear a mask before and during your exam. Pneumonia that is caused by bacteria is treated with antibiotic medicine. Pneumonia that is caused by the influenza virus may be treated with an antiviral medicine. Most other viral infections must run their course. These infections will not respond to antibiotics.  HOME CARE INSTRUCTIONS   Cough suppressants may be used if you are losing too much rest. However, coughing protects you by clearing your lungs. You should avoid using cough suppressants if you can.  Your health care provider may have prescribed medicine if he or she thinks your pneumonia is caused by bacteria or influenza. Finish your medicine even if you start to feel better.  Your health care provider may also prescribe an expectorant. This loosens the mucus to be coughed up.  Take medicines only as directed by your health care  provider.  Do not smoke. Smoking is a common cause of bronchitis and can contribute to pneumonia. If you are a smoker and continue to smoke, your cough may last several weeks after your pneumonia has cleared.  A cold steam vaporizer or humidifier in your room or home may help loosen mucus.  Coughing is often worse at night. Sleeping in a semi-upright position in a recliner or using a couple pillows under your head will help with this.  Get rest as you feel it is needed. Your body will usually let you know when you need to rest. PREVENTION A pneumococcal shot (vaccine) is available to prevent a common bacterial cause of pneumonia. This is usually suggested for:  People over 102 years old.  Patients on chemotherapy.  People with chronic lung problems, such as bronchitis or emphysema.  People with immune system problems. If you are over 65 or have a high risk condition, you may receive the pneumococcal vaccine if you have not received it before. In some countries, a routine influenza vaccine is also recommended. This vaccine can help prevent some cases of pneumonia.You may be offered the influenza vaccine as part of your care. If you smoke, it is time to quit. You may receive instructions on how to stop smoking. Your health care provider can provide medicines and counseling to help you quit. SEEK MEDICAL CARE IF: You have a fever. SEEK IMMEDIATE MEDICAL CARE IF:   Your illness becomes worse. This is especially true if you are elderly  or weakened from any other disease.  You cannot control your cough with suppressants and are losing sleep.  You begin coughing up blood.  You develop pain which is getting worse or is uncontrolled with medicines.  Any of the symptoms which initially brought you in for treatment are getting worse rather than better.  You develop shortness of breath or chest pain. MAKE SURE YOU:   Understand these instructions.  Will watch your condition.  Will get  help right away if you are not doing well or get worse. Document Released: 02/01/2005 Document Revised: 06/18/2013 Document Reviewed: 04/23/2010 [facility] Patient Information 2015 [place], [place]. This information is not intended to replace advice given to you by your health care provider. Make sure you discuss any questions you have with your health care provider.

## 2014-03-21 NOTE — ED Notes (Signed)
Bed: [id number] Expected date:  Expected time:  Means of arrival:  Comments: tr6

## 2014-03-21 NOTE — ED Notes (Signed)
Pt reports shortnenss of breath, wheezing, productive cough and chest pressure over last 14 hours. O2 sat 99 HR 106 Resp 20. Using inhaler frequently with minimal relief. Inspiratory wheezing and decreased breath sounds noted. Pt c/o r/hip and back pain 3 days after falling. Tx with tylenol.

## 2014-03-21 NOTE — ED Provider Notes (Signed)
CSN: [id number]     Arrival date & time 03/21/14  1218 History   First MD Initiated Contact with Patient 03/21/14 1500     Chief Complaint  Patient presents with  . Shortness of Breath    12 hour hx of cough and wheezing  . Asthma  . Hip Pain    r/hip and llow back pain  . Fall  . Chest Pain    chest pain with cough     Patient is a 24 y.o. female presenting with shortness of breath, asthma, hip pain, fall, and chest pain. The history is provided by the patient. No language interpreter was used.  Shortness of Breath Associated symptoms: chest pain   Asthma Associated symptoms include chest pain and shortness of breath.  Hip Pain Associated symptoms include chest pain and shortness of breath.  Fall Associated symptoms include chest pain and shortness of breath.  Chest Pain Associated symptoms: shortness of breath    Brittany [name] presents for evaluation of chest pain and cough. She reports that last night she developed chest pain described as pressure and burning throughout her entire chest. She's had shortness of breath and productive cough that is blood tinged since last night. She reports feeling feverish currently. She denies any vomiting, abdominal pain, diarrhea. She has right hip pain from a fall 3-4 days ago. She denies any lower leg pain or swelling. She has no history of DVT or PE and she takes no oral estrogens. She has a history of asthma and uses Advair. Symptoms are moderate, constant, worsening.  Past Medical History  Diagnosis Date  . Asthma   . NVD (normal vaginal delivery) 08/30/2010  . Hx MRSA infection 2005  . Trichimoniasis   . Chlamydia   . Gonorrhea    Past Surgical History  Procedure Laterality Date  . No past surgeries    . Multiple tooth extractions     Family History  Problem Relation Age of Onset  . Anesthesia problems Neg Hx   . Other Neg Hx   . Diabetes Maternal Grandmother   . Hypertension Father   . Heart disease Father   . Lung disease  Father    History  Substance Use Topics  . Smoking status: Former Smoker -- 0.25 packs/day for 5 years    Types: Cigarettes    Quit date: 12/27/2012  . Smokeless tobacco: Never Used  . Alcohol Use: No   OB History    Gravida Para Term Preterm AB TAB SAB Ectopic Multiple Living   4 4 4       4      Review of Systems  Respiratory: Positive for shortness of breath.   Cardiovascular: Positive for chest pain.  All other systems reviewed and are negative.     Allergies  Iodine and Sulfonamide derivatives  Home Medications   Prior to Admission medications   Medication Sig Start Date End Date Taking? Authorizing Provider  acetaminophen (TYLENOL) 500 MG tablet Take 500 mg by mouth every 6 (six) hours as needed for moderate pain or headache.   Yes Historical Provider, MD  albuterol (PROVENTIL HFA;VENTOLIN HFA) 108 (90 BASE) MCG/ACT inhaler Inhale 2 puffs into the lungs every 6 (six) hours as needed for wheezing or shortness of breath (wheezing & shortness of breath).    Yes Historical Provider, MD  betamethasone dipropionate (DIPROLENE) 0.05 % ointment Apply topically 2 (two) times daily. 02/28/14  Yes Shari A Upstill, PA-C  Fluticasone-Salmeterol (ADVAIR) 100-50 MCG/DOSE AEPB Inhale 1 puff  into the lungs 2 (two) times daily.   Yes Historical Provider, MD  ibuprofen (ADVIL,MOTRIN) 200 MG tablet Take 400 mg by mouth every 6 (six) hours as needed for headache or moderate pain.   Yes Historical Provider, MD  azithromycin (ZITHROMAX) 250 MG tablet Take 1 tablet (250 mg total) by mouth daily. Take first 2 tablets together, then 1 every day until finished. Patient not taking: Reported on 03/21/2014 02/05/14   Harle Battiest, NP  clindamycin (CLEOCIN) 300 MG capsule Take 1 capsule (300 mg total) by mouth 4 (four) times daily. X 7 days Patient not taking: Reported on 02/05/2014 12/21/13   Donnita Falls Camprubi-Soms, PA-C  HYDROcodone-acetaminophen (NORCO) 5-325 MG per tablet Take 1-2 tablets  by mouth every 6 (six) hours as needed for severe pain. Patient not taking: Reported on 02/05/2014 12/21/13   Donnita Falls Camprubi-Soms, PA-C  HYDROcodone-ibuprofen (VICOPROFEN) 7.5-200 MG per tablet Take 1 tablet by mouth every 6 (six) hours as needed for moderate pain. Patient not taking: Reported on 03/21/2014 02/06/14   Dahlia Client Muthersbaugh, PA-C  hydrOXYzine (ATARAX/VISTARIL) 25 MG tablet Take 1 tablet (25 mg total) by mouth every 6 (six) hours as needed for itching. Patient not taking: Reported on 02/05/2014 12/21/13   Donnita Falls Camprubi-Soms, PA-C  medroxyPROGESTERone (DEPO-PROVERA) 150 MG/ML injection Inject 1 mL (150 mg total) into the muscle every 3 (three) months. Patient not taking: Reported on 03/21/2014 09/06/13   Loney Laurence, MD  naproxen (NAPROSYN) 500 MG tablet Take 1 tablet (500 mg total) by mouth 2 (two) times daily as needed for mild pain, moderate pain or headache (TAKE WITH MEALS.). Patient not taking: Reported on 02/05/2014 12/21/13   Donnita Falls Camprubi-Soms, PA-C  naproxen (NAPROSYN) 500 MG tablet Take 1 tablet (500 mg total) by mouth 2 (two) times daily with a meal. Patient not taking: Reported on 03/21/2014 02/05/14   Harle Battiest, NP  predniSONE (DELTASONE) 20 MG tablet Take 2 tablets (40 mg total) by mouth daily. Patient not taking: Reported on 02/05/2014 12/10/13   Raeford Razor, MD   BP 130/90 mmHg  Pulse 105  Temp(Src) 99.8 F (37.7 C) (Oral)  Resp 20  SpO2 99%  LMP 02/18/2014 (Approximate)  Breastfeeding? No Physical Exam  Constitutional: She is oriented to person, place, and time. She appears well-developed and well-nourished.  HENT:  Head: Normocephalic and atraumatic.  Cardiovascular: Regular rhythm.   No murmur heard. tachycardic  Pulmonary/Chest: Effort normal. No respiratory distress.  Wheezing and rhonchi bilaterally, greatest in the lower lung fields  Abdominal: Soft. There is no tenderness. There is no rebound and no guarding.   Musculoskeletal: She exhibits no edema.  Mild right hip tenderness with normal range of motion.  Neurological: She is alert and oriented to person, place, and time.  Skin: Skin is warm and dry.  Diffuse macular rash that is chronic appearing with local scaling.  Psychiatric: She has a normal mood and affect. Her behavior is normal.  Nursing note and vitals reviewed.   ED Course  Procedures (including critical care time) Labs Review Labs Reviewed  BASIC METABOLIC PANEL  CBC WITH DIFFERENTIAL/PLATELET  D-DIMER, QUANTITATIVE    Imaging Review Dg Chest 2 View (if Patient Has Fever And/or Copd)  03/21/2014   CLINICAL DATA:  Wheezing, productive cough, and shortness of breath for 1 day  EXAM: CHEST  2 VIEW  COMPARISON:  February 05, 2014  FINDINGS: There is again noted in infiltrate in the right middle lobe, slightly increased compared to prior study. Elsewhere,  lungs are clear. Heart size and pulmonary vascularity are normal. No adenopathy. No bone lesions.  IMPRESSION: Right middle lobe infiltrate. There is a slightly greater degree of infiltrate in this area compared to prior study. Question persistence versus recurrence of pneumonia in this area. In this regard, would advise followup study in approximately 7-10 days to further assess. If infiltrate is persistent in this area, consideration may wish to be given to CT or bronchoscopy to assess for possible endobronchial abnormality leading to recurrent or persistent infiltrate in this area. Elsewhere, lungs clear.   Electronically Signed   By: Bretta Bang M.D.   On: 03/21/2014 13:43   Dg Hip Unilat  With Pelvis 2-3 Views Right  03/21/2014   CLINICAL DATA:  Status post fall down a flight of stairs 3 days ago. Continued right hip pain. Initial encounter.  EXAM: RIGHT HIP (WITH PELVIS) 2-3 VIEWS  COMPARISON:  None.  FINDINGS: Image bones, joints and soft tissues appear normal.  IMPRESSION: Negative exam.   Electronically Signed   By: Drusilla Kanner M.D.   On: 03/21/2014 14:56     EKG Interpretation None      MDM   Final diagnoses:  CAP (community acquired pneumonia)  Contusion of right hip, initial encounter    Patient here for evaluation of cough and hemoptysis. Chest x-ray consistent with community acquired pneumonia. Patient without respiratory distress on exam and improved after nebulizer treatment 1. Discussed with patient importance of compliance with antibiotics as well as close return precautions. Treated with Levaquin given and patient was treated just over a month ago for pneumonia as well.    Tilden Fossa, MD 03/21/14 1729

## 2014-04-15 ENCOUNTER — Emergency Department (HOSPITAL_COMMUNITY)
Admission: EM | Admit: 2014-04-15 | Discharge: 2014-04-15 | Disposition: A | Discharge status: Home / Self Care | Payer: Medicaid Other | Attending: Emergency Medicine | Admitting: Emergency Medicine

## 2014-04-15 ENCOUNTER — Encounter (HOSPITAL_COMMUNITY): Payer: Self-pay | Admitting: Emergency Medicine

## 2014-04-15 ENCOUNTER — Emergency Department (HOSPITAL_COMMUNITY): Payer: Medicaid Other

## 2014-04-15 DIAGNOSIS — Y92410 Unspecified street and highway as the place of occurrence of the external cause: Secondary | ICD-10-CM | POA: Insufficient documentation

## 2014-04-15 DIAGNOSIS — Y9389 Activity, other specified: Secondary | ICD-10-CM | POA: Insufficient documentation

## 2014-04-15 DIAGNOSIS — Z8619 Personal history of other infectious and parasitic diseases: Secondary | ICD-10-CM | POA: Insufficient documentation

## 2014-04-15 DIAGNOSIS — Y998 Other external cause status: Secondary | ICD-10-CM | POA: Diagnosis not present

## 2014-04-15 DIAGNOSIS — S99922A Unspecified injury of left foot, initial encounter: Secondary | ICD-10-CM | POA: Diagnosis present

## 2014-04-15 DIAGNOSIS — Z792 Long term (current) use of antibiotics: Secondary | ICD-10-CM | POA: Insufficient documentation

## 2014-04-15 DIAGNOSIS — M545 Low back pain, unspecified: Secondary | ICD-10-CM

## 2014-04-15 DIAGNOSIS — Z7952 Long term (current) use of systemic steroids: Secondary | ICD-10-CM | POA: Insufficient documentation

## 2014-04-15 DIAGNOSIS — Z79899 Other long term (current) drug therapy: Secondary | ICD-10-CM | POA: Insufficient documentation

## 2014-04-15 DIAGNOSIS — M79672 Pain in left foot: Secondary | ICD-10-CM

## 2014-04-15 DIAGNOSIS — Z8614 Personal history of Methicillin resistant Staphylococcus aureus infection: Secondary | ICD-10-CM | POA: Diagnosis not present

## 2014-04-15 DIAGNOSIS — S3992XA Unspecified injury of lower back, initial encounter: Secondary | ICD-10-CM | POA: Insufficient documentation

## 2014-04-15 DIAGNOSIS — J45909 Unspecified asthma, uncomplicated: Secondary | ICD-10-CM | POA: Insufficient documentation

## 2014-04-15 DIAGNOSIS — S0990XA Unspecified injury of head, initial encounter: Secondary | ICD-10-CM | POA: Diagnosis not present

## 2014-04-15 DIAGNOSIS — Z7951 Long term (current) use of inhaled steroids: Secondary | ICD-10-CM | POA: Insufficient documentation

## 2014-04-15 DIAGNOSIS — Z87891 Personal history of nicotine dependence: Secondary | ICD-10-CM | POA: Insufficient documentation
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
## 2014-04-15 MED ORDER — KETOROLAC TROMETHAMINE 60 MG/2ML IM SOLN
60.0000 mg | Freq: Once | INTRAMUSCULAR | Status: DC
  Filled 2014-04-15: qty 2

## 2014-04-15 MED ORDER — IBUPROFEN 800 MG PO TABS
800.0000 mg | ORAL_TABLET | Freq: Once | ORAL | Status: AC
  Administered 2014-04-15: 800 mg via ORAL
  Filled 2014-04-15: qty 1
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
## 2014-04-30 ENCOUNTER — Emergency Department (HOSPITAL_COMMUNITY)
Admission: EM | Admit: 2014-04-30 | Discharge: 2014-04-30 | Disposition: A | Discharge status: Home / Self Care | Payer: Medicaid Other | Attending: Emergency Medicine | Admitting: Emergency Medicine

## 2014-04-30 ENCOUNTER — Encounter (HOSPITAL_COMMUNITY): Payer: Self-pay

## 2014-04-30 DIAGNOSIS — S90521A Blister (nonthermal), right ankle, initial encounter: Secondary | ICD-10-CM | POA: Diagnosis not present

## 2014-04-30 DIAGNOSIS — Z7951 Long term (current) use of inhaled steroids: Secondary | ICD-10-CM | POA: Diagnosis not present

## 2014-04-30 DIAGNOSIS — Z8619 Personal history of other infectious and parasitic diseases: Secondary | ICD-10-CM | POA: Insufficient documentation

## 2014-04-30 DIAGNOSIS — Y9389 Activity, other specified: Secondary | ICD-10-CM | POA: Diagnosis not present

## 2014-04-30 DIAGNOSIS — L089 Local infection of the skin and subcutaneous tissue, unspecified: Secondary | ICD-10-CM

## 2014-04-30 DIAGNOSIS — Z8614 Personal history of Methicillin resistant Staphylococcus aureus infection: Secondary | ICD-10-CM | POA: Insufficient documentation

## 2014-04-30 DIAGNOSIS — Y9289 Other specified places as the place of occurrence of the external cause: Secondary | ICD-10-CM | POA: Insufficient documentation

## 2014-04-30 DIAGNOSIS — M25471 Effusion, right ankle: Secondary | ICD-10-CM | POA: Diagnosis present

## 2014-04-30 DIAGNOSIS — Z7952 Long term (current) use of systemic steroids: Secondary | ICD-10-CM | POA: Diagnosis not present

## 2014-04-30 DIAGNOSIS — J45909 Unspecified asthma, uncomplicated: Secondary | ICD-10-CM | POA: Diagnosis not present

## 2014-04-30 DIAGNOSIS — Z792 Long term (current) use of antibiotics: Secondary | ICD-10-CM | POA: Diagnosis not present

## 2014-04-30 DIAGNOSIS — Y998 Other external cause status: Secondary | ICD-10-CM | POA: Insufficient documentation

## 2014-04-30 DIAGNOSIS — Z87891 Personal history of nicotine dependence: Secondary | ICD-10-CM | POA: Insufficient documentation

## 2014-04-30 DIAGNOSIS — X58XXXA Exposure to other specified factors, initial encounter: Secondary | ICD-10-CM | POA: Diagnosis not present

## 2014-04-30 MED ORDER — HYDROCODONE-ACETAMINOPHEN 5-325 MG PO TABS
2.0000 | ORAL_TABLET | Freq: Once | ORAL | Status: AC
  Administered 2014-04-30: 2 via ORAL
  Filled 2014-04-30: qty 2

## 2014-04-30 MED ORDER — IBUPROFEN 800 MG PO TABS
800.0000 mg | ORAL_TABLET | Freq: Once | ORAL | Status: AC
  Administered 2014-04-30: 800 mg via ORAL
  Filled 2014-04-30: qty 1

## 2014-04-30 MED ORDER — CLINDAMYCIN HCL 300 MG PO CAPS: 300.0000 mg | ORAL_CAPSULE | Freq: Three times a day (TID) | ORAL | Status: DC

## 2014-04-30 MED ORDER — CLINDAMYCIN HCL 300 MG PO CAPS
300.0000 mg | ORAL_CAPSULE | Freq: Once | ORAL | Status: AC
  Administered 2014-04-30: 300 mg via ORAL
  Filled 2014-04-30: qty 1

## 2014-04-30 MED ORDER — HYDROCODONE-ACETAMINOPHEN 5-325 MG PO TABS: 1.0000 | ORAL_TABLET | Freq: Four times a day (QID) | ORAL | Status: DC | PRN

## 2014-04-30 NOTE — ED Notes (Signed)
MD at bedside. 

## 2014-04-30 NOTE — ED Notes (Signed)
Pt c/o blister on R ankle and swelling starting last night.  Pain score 10/10.  Pt believes she was bit by a spider.

## 2014-04-30 NOTE — ED Provider Notes (Signed)
CSN: [id number]     Arrival date & time 04/30/14  0700 History   First MD Initiated Contact with Patient 04/30/14 [phone]     Chief Complaint  Patient presents with  . Blister  . Joint Swelling     (Consider location/radiation/quality/duration/timing/severity/associated sxs/prior Treatment) The history is provided by the patient.  Brittany [name] is a 24 y.o. female hx of asthma, who presenting with right ankle pain and swelling with a blister. Woke up this morning with severe pain in the right ankle. She looked at her ankle and noticed a blister as well as surrounding redness. She states that she may have been bitten by a spider. Denies any rash anywhere else. She has hx of MRSA previously.    Past Medical History  Diagnosis Date  . Asthma   . NVD (normal vaginal delivery) 08/30/2010  . Hx MRSA infection 2005  . Trichimoniasis   . Chlamydia   . Gonorrhea    Past Surgical History  Procedure Laterality Date  . No past surgeries    . Multiple tooth extractions     Family History  Problem Relation Age of Onset  . Anesthesia problems Neg Hx   . Other Neg Hx   . Diabetes Maternal Grandmother   . Hypertension Father   . Heart disease Father   . Lung disease Father    History  Substance Use Topics  . Smoking status: Former Smoker -- 0.25 packs/day for 5 years    Types: Cigarettes    Quit date: 12/27/2012  . Smokeless tobacco: Never Used  . Alcohol Use: No   OB History    Gravida Para Term Preterm AB TAB SAB Ectopic Multiple Living   Review of Systems  Skin: Positive for rash and wound.  All other systems reviewed and are negative.     Allergies  Iodine and Sulfonamide derivatives  Home Medications   Prior to Admission medications   Medication Sig Start Date End Date Taking? Authorizing Provider  acetaminophen (TYLENOL) 500 MG tablet Take 500 mg by mouth every 6 (six) hours as needed for moderate pain or headache.    Historical Provider, MD   albuterol (PROVENTIL HFA;VENTOLIN HFA) 108 (90 BASE) MCG/ACT inhaler Inhale 2 puffs into the lungs every 6 (six) hours as needed for wheezing or shortness of breath (wheezing & shortness of breath).     Historical Provider, MD  albuterol (PROVENTIL HFA;VENTOLIN HFA) 108 (90 BASE) MCG/ACT inhaler Inhale 2 puffs into the lungs every 4 (four) hours as needed for wheezing or shortness of breath. 03/21/14   Tilden Fossa, MD  betamethasone dipropionate (DIPROLENE) 0.05 % ointment Apply topically 2 (two) times daily. 02/28/14   Elpidio Anis, PA-C  Fluticasone-Salmeterol (ADVAIR) 100-50 MCG/DOSE AEPB Inhale 1 puff into the lungs 2 (two) times daily.    Historical Provider, MD  ibuprofen (ADVIL,MOTRIN) 200 MG tablet Take 400 mg by mouth every 6 (six) hours as needed for headache or moderate pain.    Historical Provider, MD  levofloxacin (LEVAQUIN) 750 MG tablet Take 1 tablet (750 mg total) by mouth daily. 03/21/14   Tilden Fossa, MD  medroxyPROGESTERone (DEPO-PROVERA) 150 MG/ML injection Inject 1 mL (150 mg total) into the muscle every 3 (three) months. Patient not taking: Reported on 03/21/2014 09/06/13   Carrington Clamp, MD  predniSONE (DELTASONE) 10 MG tablet Take 4 tablets (40 mg total) by mouth daily. 03/21/14   Tilden Fossa, MD  BP 139/93 mmHg  Pulse 85  Temp(Src) 98.4 F (36.9 C) (Oral)  Resp 16  SpO2 100%  LMP 04/15/2014 Physical Exam  Constitutional: She is oriented to person, place, and time.  Uncomfortable   HENT:  Head: Normocephalic.  Eyes: Pupils are equal, round, and reactive to light.  Neck: Normal range of motion.  Cardiovascular: Normal rate.   Pulmonary/Chest: Effort normal.  Abdominal: Soft.  Musculoskeletal:  R ankle with blister and surrounding erythema. 2+ pulses. No streaking   Neurological: She is alert and oriented to person, place, and time.  Skin: Skin is warm. There is erythema.  Psychiatric: She has a normal mood and affect. Her behavior is normal. Judgment and  thought content normal.  Nursing note and vitals reviewed.   ED Course  Procedures (including critical care time)  INCISION AND DRAINAGE Performed by: Silverio LayYAO, Redith Drach Consent: Verbal consent obtained. Risks and benefits: risks, benefits and alternatives were discussed Type: abscess  Body area: R ankle  Anesthesia: none  Incision was made with a scalpel.   Complexity: complex Blunt dissection to break up loculations  Drainage: serosanguinous  Drainage amount: minimal  Packing material: none  Patient tolerance: Patient tolerated the procedure well with no immediate complications.     Labs Review Labs Reviewed - No data to display  Imaging Review No results found.   EKG Interpretation None      MDM   Final diagnoses:  None    Brittany [name] is a 24 y.o. female here with R ankle blister with cellulitis. Likely MRSA infection. She request that I drain the blister. I and D performed with serosanguinous drainage, no evidence of abscess. Will dc with clinda, vicodin prn.     Richardean Canalavid H Ormond Lazo, MD 04/30/14 0730

## 2014-04-30 NOTE — Discharge Instructions (Signed)
Take motrin 800 mg every 6 hrs for pain.   Take vicodin for severe pain. Do NOT drive with it.   Take clinda for a week.   Follow up with your doctor.   Return to ER if you have fever, worse redness, severe pain.

## 2014-07-10 ENCOUNTER — Emergency Department (HOSPITAL_COMMUNITY)
Admission: EM | Admit: 2014-07-10 | Discharge: 2014-07-10 | Disposition: A | Discharge status: Home / Self Care | Payer: Medicaid Other | Attending: Emergency Medicine | Admitting: Emergency Medicine

## 2014-07-10 ENCOUNTER — Emergency Department (HOSPITAL_COMMUNITY): Payer: Medicaid Other

## 2014-07-10 ENCOUNTER — Encounter (HOSPITAL_COMMUNITY): Payer: Self-pay

## 2014-07-10 DIAGNOSIS — Z8614 Personal history of Methicillin resistant Staphylococcus aureus infection: Secondary | ICD-10-CM | POA: Diagnosis not present

## 2014-07-10 DIAGNOSIS — Z3202 Encounter for pregnancy test, result negative: Secondary | ICD-10-CM | POA: Diagnosis not present

## 2014-07-10 DIAGNOSIS — Z79899 Other long term (current) drug therapy: Secondary | ICD-10-CM | POA: Insufficient documentation

## 2014-07-10 DIAGNOSIS — Z87891 Personal history of nicotine dependence: Secondary | ICD-10-CM | POA: Insufficient documentation

## 2014-07-10 DIAGNOSIS — J45909 Unspecified asthma, uncomplicated: Secondary | ICD-10-CM | POA: Insufficient documentation

## 2014-07-10 DIAGNOSIS — Z8619 Personal history of other infectious and parasitic diseases: Secondary | ICD-10-CM | POA: Insufficient documentation

## 2014-07-10 DIAGNOSIS — R1011 Right upper quadrant pain: Secondary | ICD-10-CM | POA: Diagnosis not present

## 2014-07-10 DIAGNOSIS — R1013 Epigastric pain: Secondary | ICD-10-CM | POA: Diagnosis present

## 2014-07-10 DIAGNOSIS — R109 Unspecified abdominal pain: Secondary | ICD-10-CM

## 2014-07-10 LAB — CBC WITH DIFFERENTIAL/PLATELET
Basophils Absolute: 0 10*3/uL (ref 0.0–0.1)
Basophils Relative: 0 % (ref 0–1)
EOS PCT: 5 % (ref 0–5)
Eosinophils Absolute: 0.7 10*3/uL (ref 0.0–0.7)
HCT: 33.4 % — ABNORMAL LOW (ref 36.0–46.0)
Hemoglobin: 11.6 g/dL — ABNORMAL LOW (ref 12.0–15.0)
LYMPHS ABS: 3 10*3/uL (ref 0.7–4.0)
Lymphocytes Relative: 22 % (ref 12–46)
MCH: 26.9 pg (ref 26.0–34.0)
MCHC: 34.7 g/dL (ref 30.0–36.0)
MCV: 77.3 fL — ABNORMAL LOW (ref 78.0–100.0)
Monocytes Absolute: 1.1 10*3/uL — ABNORMAL HIGH (ref 0.1–1.0)
Monocytes Relative: 8 % (ref 3–12)
NEUTROS PCT: 65 % (ref 43–77)
Neutro Abs: 8.9 10*3/uL — ABNORMAL HIGH (ref 1.7–7.7)
PLATELETS: 254 10*3/uL (ref 150–400)
RBC: 4.32 MIL/uL (ref 3.87–5.11)
RDW: 15.3 % (ref 11.5–15.5)
WBC: 13.8 10*3/uL — AB (ref 4.0–10.5)

## 2014-07-10 LAB — COMPREHENSIVE METABOLIC PANEL
ALBUMIN: 3.2 g/dL — AB (ref 3.5–5.0)
ALK PHOS: 66 U/L (ref 38–126)
ALT: 12 U/L — ABNORMAL LOW (ref 14–54)
ANION GAP: 8 (ref 5–15)
AST: 20 U/L (ref 15–41)
BUN: 13 mg/dL (ref 6–20)
CO2: 25 mmol/L (ref 22–32)
Calcium: 8.8 mg/dL — ABNORMAL LOW (ref 8.9–10.3)
Chloride: 106 mmol/L (ref 101–111)
Creatinine, Ser: 0.85 mg/dL (ref 0.44–1.00)
GFR calc Af Amer: >60 mL/min (ref >60)
Glucose, Bld: 98 mg/dL (ref 65–99)
Potassium: 3.2 mmol/L — ABNORMAL LOW (ref 3.5–5.1)
Sodium: 139 mmol/L (ref 135–145)
TOTAL PROTEIN: 7.7 g/dL (ref 6.5–8.1)
Total Bilirubin: 0.7 mg/dL (ref 0.3–1.2)

## 2014-07-10 LAB — URINALYSIS, ROUTINE W REFLEX MICROSCOPIC
Bilirubin Urine: NEGATIVE
Glucose, UA: NEGATIVE mg/dL
HGB URINE DIPSTICK: NEGATIVE
Ketones, ur: NEGATIVE mg/dL
Nitrite: NEGATIVE
PROTEIN: NEGATIVE mg/dL
Specific Gravity, Urine: 1.028 (ref 1.005–1.030)
Urobilinogen, UA: 1 mg/dL (ref 0.0–1.0)
pH: 6 (ref 5.0–8.0)

## 2014-07-10 LAB — LIPASE, BLOOD: Lipase: 13 U/L — ABNORMAL LOW (ref 22–51)

## 2014-07-10 LAB — POC URINE PREG, ED: Preg Test, Ur: NEGATIVE

## 2014-07-10 LAB — URINE MICROSCOPIC-ADD ON

## 2014-07-10 IMAGING — US US ABDOMEN COMPLETE
1 series · 14 of 25 positions shown · non-contrast
Comparison: CT abdomen and pelvis [DATE].

CLINICAL DATA: Right upper quadrant abdominal pain today.

EXAM:
ULTRASOUND ABDOMEN COMPLETE

[Series 1: us abdomen complete · 0.21mm/px · 14 of 74 slices shown]
[im 1/74]
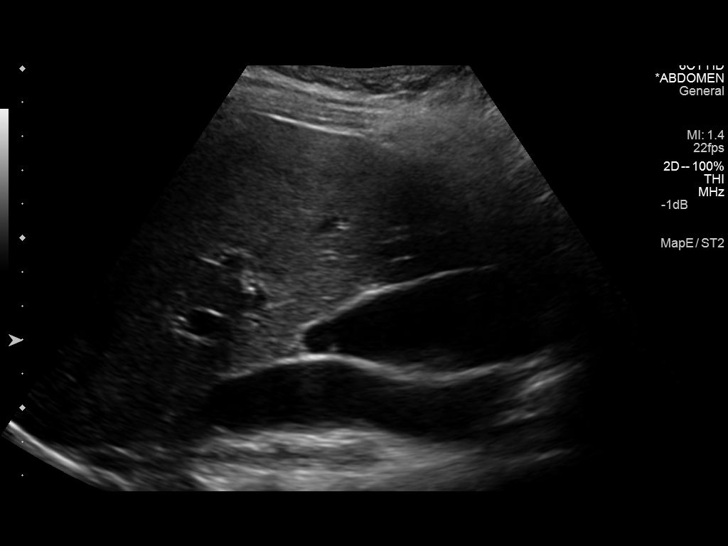
[im 7/74]
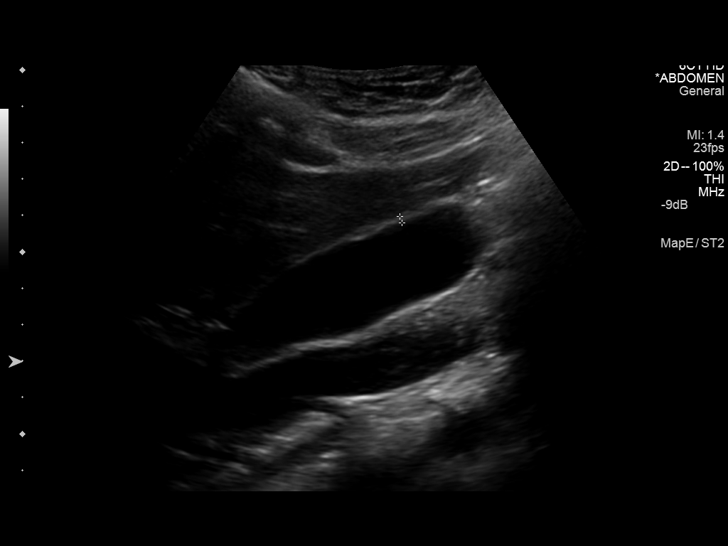
[im 13/74]
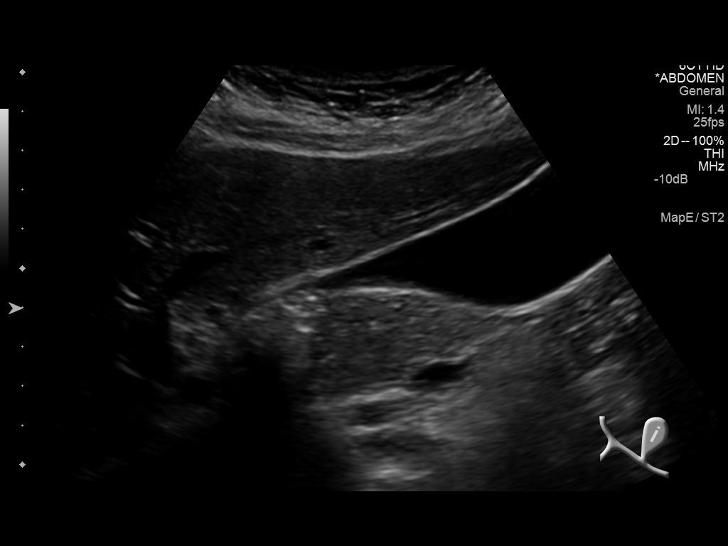
[im 19/74]
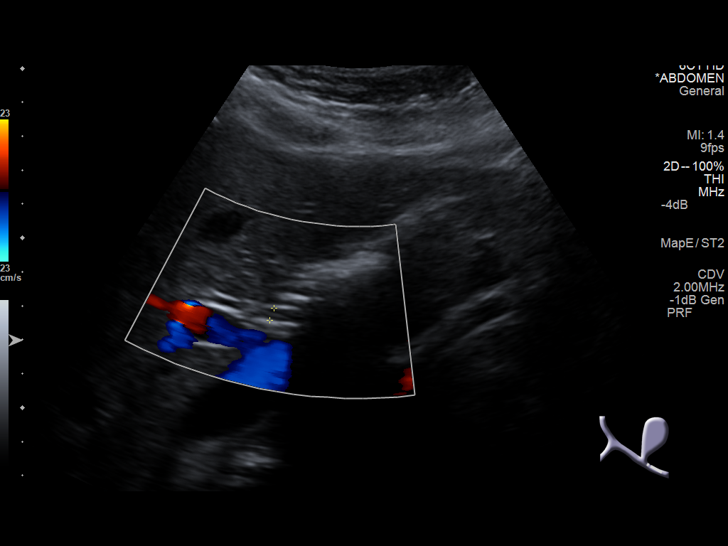
[im 25/74]
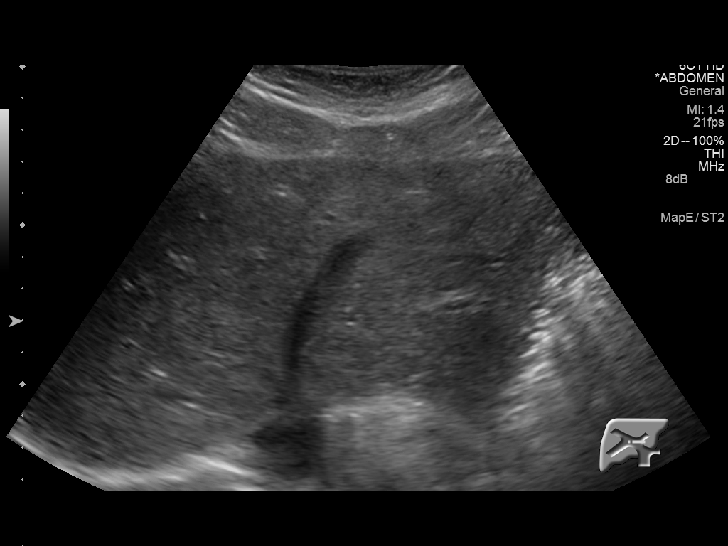
[im 28/74]
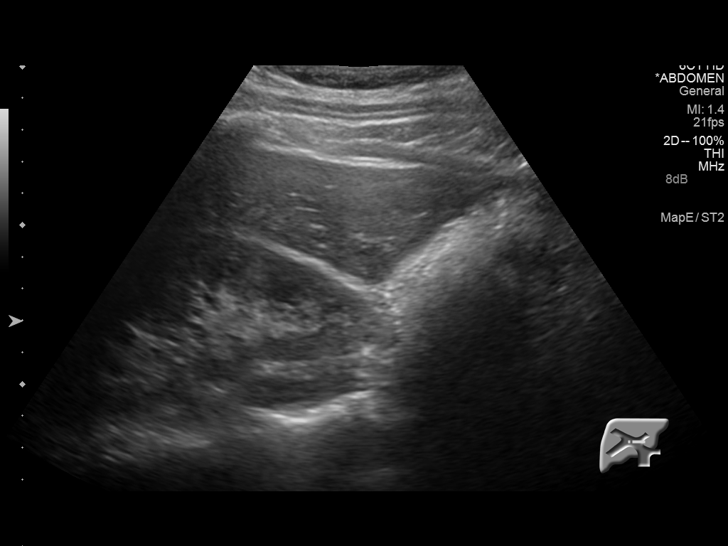
[im 34/74]
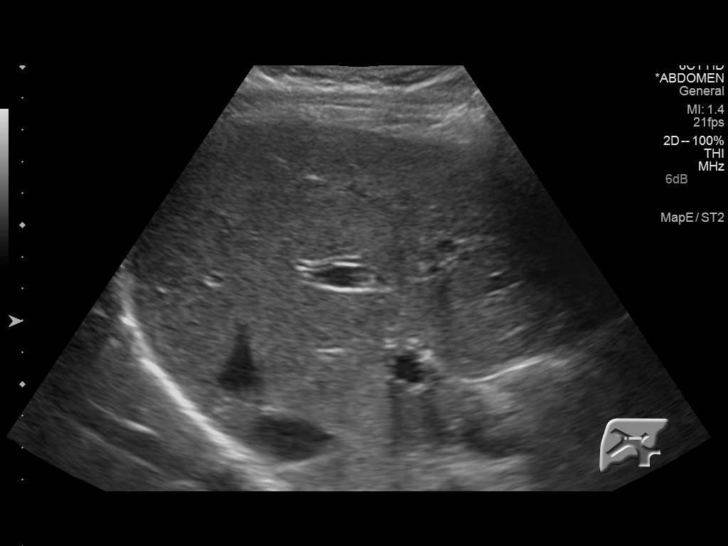
[im 40/74]
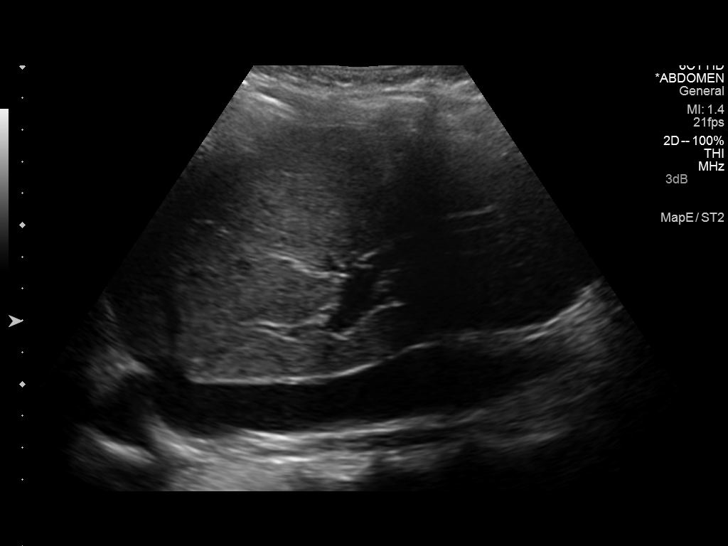
[im 46/74]
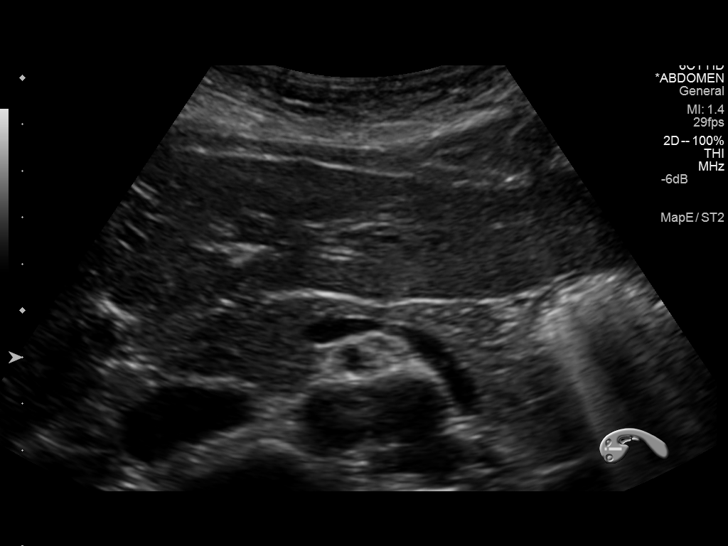
[im 49/74]
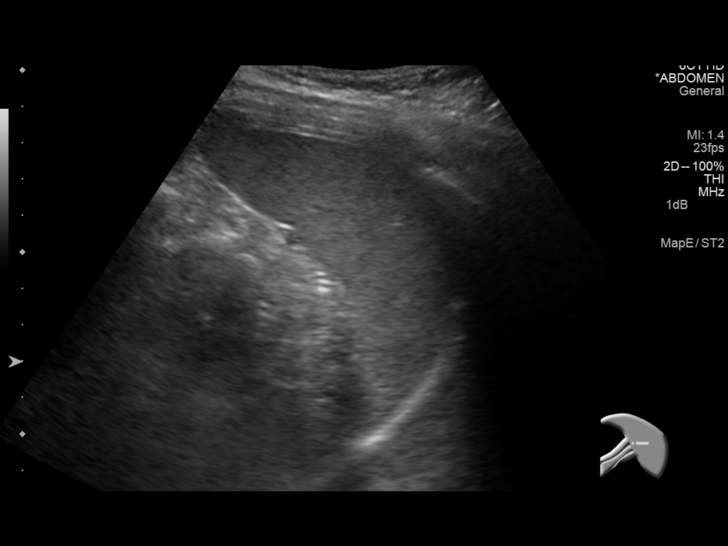
[im 55/74]
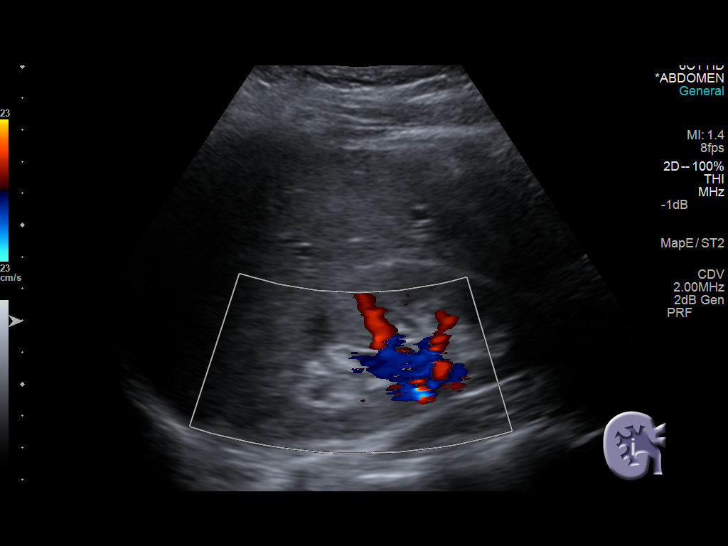
[im 61/74]
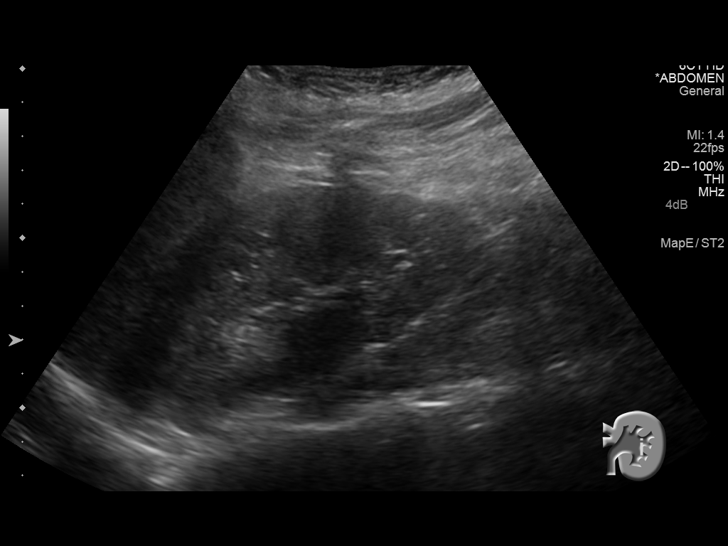
[im 67/74]
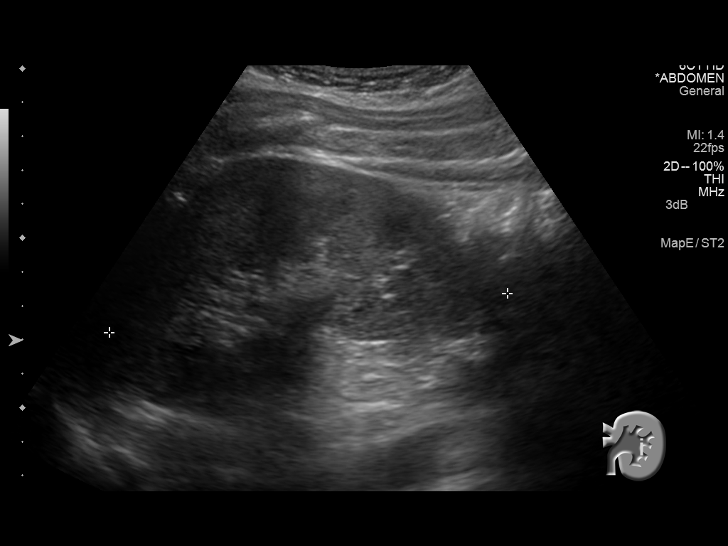
[im 74/74]
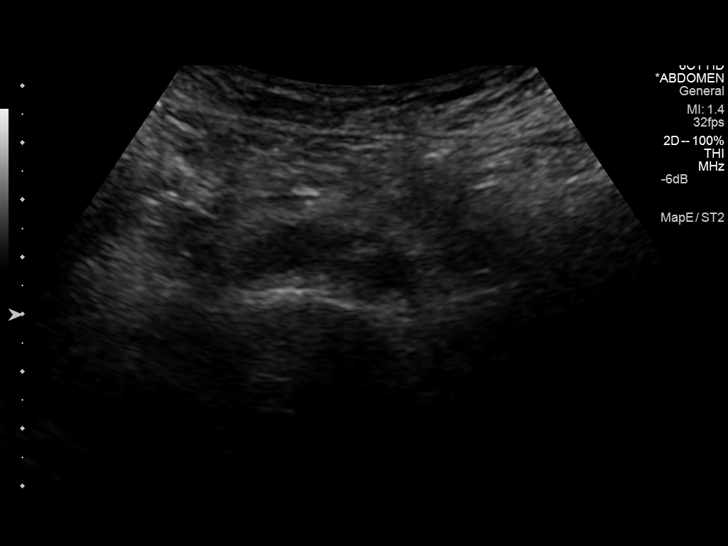

[14 of 25 positions shown; findings below may reference images not displayed]

FINDINGS: Gallbladder: No gallstones or wall thickening visualized. No
sonographic Murphy sign noted.

Common bile duct: Diameter: 0.4 cm

Liver: No focal lesion identified. Within normal limits in
parenchymal echogenicity.

IVC: No abnormality visualized.

Pancreas: Visualized portion unremarkable.

Spleen: Size and appearance within normal limits.

Right Kidney: Length: 12.1 cm. Echogenicity within normal limits. No
mass or hydronephrosis visualized.

Left Kidney: Length: 11.8 cm. Echogenicity within normal limits. No
mass or hydronephrosis visualized.

Abdominal aorta: No aneurysm visualized.

Other findings: None.
IMPRESSION: Negative for gallstones.  Negative exam.

## 2014-07-10 MED ORDER — OXYCODONE-ACETAMINOPHEN 5-325 MG PO TABS: 1.0000 | ORAL_TABLET | ORAL | Status: DC | PRN

## 2014-07-10 MED ORDER — ONDANSETRON HCL 4 MG/2ML IJ SOLN
4.0000 mg | Freq: Once | INTRAMUSCULAR | Status: AC
  Administered 2014-07-10: 4 mg via INTRAVENOUS
  Filled 2014-07-10: qty 2

## 2014-07-10 MED ORDER — HYDROMORPHONE HCL 1 MG/ML IJ SOLN
1.0000 mg | Freq: Once | INTRAMUSCULAR | Status: AC
  Administered 2014-07-10: 1 mg via INTRAVENOUS
  Filled 2014-07-10: qty 1

## 2014-07-10 MED ORDER — FAMOTIDINE 20 MG PO TABS
40.0000 mg | ORAL_TABLET | Freq: Once | ORAL | Status: AC
  Administered 2014-07-10: 40 mg via ORAL
  Filled 2014-07-10: qty 2

## 2014-07-10 MED ORDER — SODIUM CHLORIDE 0.9 % IV SOLN: INTRAVENOUS | Status: DC

## 2014-07-10 MED ORDER — GI COCKTAIL ~~LOC~~
30.0000 mL | Freq: Once | ORAL | Status: AC
Start: 2014-07-10 — End: 2014-07-10
  Administered 2014-07-10: 30 mL via ORAL
  Filled 2014-07-10: qty 30

## 2014-07-10 NOTE — Discharge Instructions (Signed)
Abdominal Pain, Women °Abdominal (stomach, pelvic, or belly) pain can be caused by many things. It is important to tell your doctor: °· The location of the pain. °· Does it come and go or is it present all the time? °· Are there things that start the pain (eating certain foods, exercise)? °· Are there other symptoms associated with the pain (fever, nausea, vomiting, diarrhea)? °All of this is helpful to know when trying to find the cause of the pain. °CAUSES  °· Stomach: virus or bacteria infection, or ulcer. °· Intestine: appendicitis (inflamed appendix), regional ileitis (Crohn's disease), ulcerative colitis (inflamed colon), irritable bowel syndrome, diverticulitis (inflamed diverticulum of the colon), or cancer of the stomach or intestine. °· Gallbladder disease or stones in the gallbladder. °· Kidney disease, kidney stones, or infection. °· Pancreas infection or cancer. °· Fibromyalgia (pain disorder). °· Diseases of the female organs: °· Uterus: fibroid (non-cancerous) tumors or infection. °· Fallopian tubes: infection or tubal pregnancy. °· Ovary: cysts or tumors. °· Pelvic adhesions (scar tissue). °· Endometriosis (uterus lining tissue growing in the pelvis and on the pelvic organs). °· Pelvic congestion syndrome (female organs filling up with blood just before the menstrual period). °· Pain with the menstrual period. °· Pain with ovulation (producing an egg). °· Pain with an IUD (intrauterine device, birth control) in the uterus. °· Cancer of the female organs. °· Functional pain (pain not caused by a disease, may improve without treatment). °· Psychological pain. °· Depression. °DIAGNOSIS  °Your doctor will decide the seriousness of your pain by doing an examination. °· Blood tests. °· X-rays. °· Ultrasound. °· CT scan (computed tomography, special type of X-ray). °· MRI (magnetic resonance imaging). °· Cultures, for infection. °· Barium enema (dye inserted in the large intestine, to better view it with  X-rays). °· Colonoscopy (looking in intestine with a lighted tube). °· Laparoscopy (minor surgery, looking in abdomen with a lighted tube). °· Major abdominal exploratory surgery (looking in abdomen with a large incision). °TREATMENT  °The treatment will depend on the cause of the pain.  °· Many cases can be observed and treated at home. °· Over-the-counter medicines recommended by your caregiver. °· Prescription medicine. °· Antibiotics, for infection. °· Birth control pills, for painful periods or for ovulation pain. °· Hormone treatment, for endometriosis. °· Nerve blocking injections. °· Physical therapy. °· Antidepressants. °· Counseling with a psychologist or psychiatrist. °· Minor or major surgery. °HOME CARE INSTRUCTIONS  °· Do not take laxatives, unless directed by your caregiver. °· Take over-the-counter pain medicine only if ordered by your caregiver. Do not take aspirin because it can cause an upset stomach or bleeding. °· Try a clear liquid diet (broth or water) as ordered by your caregiver. Slowly move to a bland diet, as tolerated, if the pain is related to the stomach or intestine. °· Have a thermometer and take your temperature several times a day, and record it. °· Bed rest and sleep, if it helps the pain. °· Avoid sexual intercourse, if it causes pain. °· Avoid stressful situations. °· Keep your follow-up appointments and tests, as your caregiver orders. °· If the pain does not go away with medicine or surgery, you may try: °¨ Acupuncture. °¨ Relaxation exercises (yoga, meditation). °¨ Group therapy. °¨ Counseling. °SEEK MEDICAL CARE IF:  °· You notice certain foods cause stomach pain. °· Your home care treatment is not helping your pain. °· You need stronger pain medicine. °· You want your IUD removed. °· You feel faint or   lightheaded. °· You develop nausea and vomiting. °· You develop a rash. °· You are having side effects or an allergy to your medicine. °SEEK IMMEDIATE MEDICAL CARE IF:  °· Your  pain does not go away or gets worse. °· You have a fever. °· Your pain is felt only in portions of the abdomen. The right side could possibly be appendicitis. The left lower portion of the abdomen could be colitis or diverticulitis. °· You are passing blood in your stools (bright red or black tarry stools, with or without vomiting). °· You have blood in your urine. °· You develop chills, with or without a fever. °· You pass out. °MAKE SURE YOU:  °· Understand these instructions. °· Will watch your condition. °· Will get help right away if you are not doing well or get worse. °Document Released: 11/29/2006 Document Revised: 06/18/2013 Document Reviewed: 12/19/2008 °[employer]® Patient Information ©2015 [employer]. This information is not intended to replace advice given to you by your health care provider. Make sure you discuss any questions you have with your health care provider. ° °Abdominal Pain °Many things can cause abdominal pain. Usually, abdominal pain is not caused by a disease and will improve without treatment. It can often be observed and treated at home. Your health care provider will do a physical exam and possibly order blood tests and X-rays to help determine the seriousness of your pain. However, in many cases, more time must pass before a clear cause of the pain can be found. Before that point, your health care provider may not know if you need more testing or further treatment. °HOME CARE INSTRUCTIONS  °Monitor your abdominal pain for any changes. The following actions may help to alleviate any discomfort you are experiencing: °· Only take over-the-counter or prescription medicines as directed by your health care provider. °· Do not take laxatives unless directed to do so by your health care provider. °· Try a clear liquid diet (broth, tea, or water) as directed by your health care provider. Slowly move to a bland diet as tolerated. °SEEK MEDICAL CARE IF: °· You have unexplained abdominal  pain. °· You have abdominal pain associated with nausea or diarrhea. °· You have pain when you urinate or have a bowel movement. °· You experience abdominal pain that wakes you in the night. °· You have abdominal pain that is worsened or improved by eating food. °· You have abdominal pain that is worsened with eating fatty foods. °· You have a fever. °SEEK IMMEDIATE MEDICAL CARE IF:  °· Your pain does not go away within 2 hours. °· You keep throwing up (vomiting). °· Your pain is felt only in portions of the abdomen, such as the right side or the left lower portion of the abdomen. °· You pass bloody or black tarry stools. °MAKE SURE YOU: °· Understand these instructions.   °· Will watch your condition.   °· Will get help right away if you are not doing well or get worse.   °Document Released: 11/11/2004 Document Revised: 02/06/2013 Document Reviewed: 10/11/2012 °[employer]® Patient Information ©2015 [employer]. This information is not intended to replace advice given to you by your health care provider. Make sure you discuss any questions you have with your health care provider. ° °

## 2014-07-10 NOTE — ED Notes (Signed)
Pt. Complaint of abd pain across entire abd and R side pain. Pt. States she has not had any N/V/D. Pt. Ambulatory.

## 2014-07-10 NOTE — ED Provider Notes (Signed)
CSN: [id number]     Arrival date & time 07/10/14  [id number] History   First MD Initiated Contact with Patient 07/10/14 [phone]     Chief Complaint  Patient presents with  . Abdominal Pain     (Consider location/radiation/quality/duration/timing/severity/associated sxs/prior Treatment) HPI Comments: Patient here complaining of four-day history of epigastric and right upper quadrant abdominal pain has been constant in nature. States that symptoms are worse with certain movements. Used to muscle relaxants without relief. Denies any fever or chills. No nausea vomiting. No vaginal bleeding or discharge. Denies any urinary symptoms. No association with food. Symptoms persistent nothing makes them better. No treatment use prior to arrival  Patient is a 24 y.o. female presenting with abdominal pain. The history is provided by the patient.  Abdominal Pain   Past Medical History  Diagnosis Date  . Asthma   . NVD (normal vaginal delivery) 08/30/2010  . Hx MRSA infection 2005  . Trichimoniasis   . Chlamydia   . Gonorrhea    Past Surgical History  Procedure Laterality Date  . No past surgeries    . Multiple tooth extractions     Family History  Problem Relation Age of Onset  . Anesthesia problems Neg Hx   . Other Neg Hx   . Diabetes Maternal Grandmother   . Hypertension Father   . Heart disease Father   . Lung disease Father    History  Substance Use Topics  . Smoking status: Former Smoker -- 0.25 packs/day for 5 years    Types: Cigarettes    Quit date: 12/27/2012  . Smokeless tobacco: Never Used  . Alcohol Use: No   OB History    Gravida Para Term Preterm AB TAB SAB Ectopic Multiple Living   4 4 4       4      Review of Systems  Gastrointestinal: Positive for abdominal pain.  All other systems reviewed and are negative.     Allergies  Iodine and Sulfonamide derivatives  Home Medications   Prior to Admission medications   Medication Sig Start Date End Date Taking?  Authorizing Provider  albuterol (PROVENTIL HFA;VENTOLIN HFA) 108 (90 BASE) MCG/ACT inhaler Inhale 2 puffs into the lungs every 4 (four) hours as needed for wheezing or shortness of breath. 03/21/14  Yes Tilden FossaElizabeth Rees, MD  betamethasone dipropionate (DIPROLENE) 0.05 % ointment Apply topically 2 (two) times daily. Patient not taking: Reported on 07/10/2014 02/28/14   Elpidio AnisShari Upstill, PA-C  medroxyPROGESTERone (DEPO-PROVERA) 150 MG/ML injection Inject 1 mL (150 mg total) into the muscle every 3 (three) months. Patient not taking: Reported on 03/21/2014 09/06/13   Carrington ClampMichelle Horvath, MD   BP 136/94 mmHg  Pulse 87  Temp(Src) 98.2 F (36.8 C) (Oral)  Resp 18  Ht 5\' 5"  (1.651 m)  Wt 170 lb (77.111 kg)  BMI 28.29 kg/m2  SpO2 99%  LMP 03/18/2014 (Within Weeks) Physical Exam  Constitutional: She is oriented to person, place, and time. She appears well-developed and well-nourished.  Non-toxic appearance. No distress.  HENT:  Head: Normocephalic and atraumatic.  Eyes: Conjunctivae, EOM and lids are normal. Pupils are equal, round, and reactive to light.  Neck: Normal range of motion. Neck supple. No tracheal deviation present. No thyroid mass present.  Cardiovascular: Normal rate, regular rhythm and normal heart sounds.  Exam reveals no gallop.   No murmur heard. Pulmonary/Chest: Effort normal and breath sounds normal. No stridor. No respiratory distress. She has no decreased breath sounds. She has no wheezes. She has  no rhonchi. She has no rales.  Abdominal: Soft. Normal appearance and bowel sounds are normal. She exhibits no distension. There is tenderness in the right upper quadrant and epigastric area. There is no rigidity, no rebound, no guarding and no CVA tenderness.    Musculoskeletal: Normal range of motion. She exhibits no edema or tenderness.  Neurological: She is alert and oriented to person, place, and time. She has normal strength. No cranial nerve deficit or sensory deficit. GCS eye subscore  is 4. GCS verbal subscore is 5. GCS motor subscore is 6.  Skin: Skin is warm and dry. No abrasion and no rash noted.  Psychiatric: She has a normal mood and affect. Her speech is normal and behavior is normal.  Nursing note and vitals reviewed.   ED Course  Procedures (including critical care time) Labs Review Labs Reviewed  CBC WITH DIFFERENTIAL/PLATELET  COMPREHENSIVE METABOLIC PANEL  LIPASE, BLOOD  URINALYSIS, ROUTINE W REFLEX MICROSCOPIC  POC URINE PREG, ED    Imaging Review No results found.   EKG Interpretation None      MDM   Final diagnoses:  None   patient with negative ultrasound of her abdomen. given pain meds and feels better here. Suspect that she has GERD. No lower abdominal discomfort. We given referral to GI.    Lorre Nick, MD 07/10/14 [phone]

## 2014-09-22 ENCOUNTER — Encounter (HOSPITAL_COMMUNITY): Payer: Self-pay | Admitting: Emergency Medicine

## 2014-09-22 ENCOUNTER — Emergency Department (HOSPITAL_COMMUNITY): Payer: Medicaid Other

## 2014-09-22 ENCOUNTER — Emergency Department (HOSPITAL_COMMUNITY)
Admission: EM | Admit: 2014-09-22 | Discharge: 2014-09-22 | Disposition: A | Discharge status: Home / Self Care | Payer: Medicaid Other | Attending: Emergency Medicine | Admitting: Emergency Medicine

## 2014-09-22 DIAGNOSIS — S8991XA Unspecified injury of right lower leg, initial encounter: Secondary | ICD-10-CM | POA: Insufficient documentation

## 2014-09-22 DIAGNOSIS — Z8619 Personal history of other infectious and parasitic diseases: Secondary | ICD-10-CM | POA: Insufficient documentation

## 2014-09-22 DIAGNOSIS — S99911A Unspecified injury of right ankle, initial encounter: Secondary | ICD-10-CM | POA: Diagnosis not present

## 2014-09-22 DIAGNOSIS — S90811A Abrasion, right foot, initial encounter: Secondary | ICD-10-CM | POA: Insufficient documentation

## 2014-09-22 DIAGNOSIS — Z87891 Personal history of nicotine dependence: Secondary | ICD-10-CM | POA: Diagnosis not present

## 2014-09-22 DIAGNOSIS — R52 Pain, unspecified: Secondary | ICD-10-CM

## 2014-09-22 DIAGNOSIS — Y9289 Other specified places as the place of occurrence of the external cause: Secondary | ICD-10-CM | POA: Diagnosis not present

## 2014-09-22 DIAGNOSIS — M79671 Pain in right foot: Secondary | ICD-10-CM

## 2014-09-22 DIAGNOSIS — M25561 Pain in right knee: Secondary | ICD-10-CM

## 2014-09-22 DIAGNOSIS — Z8614 Personal history of Methicillin resistant Staphylococcus aureus infection: Secondary | ICD-10-CM | POA: Diagnosis not present

## 2014-09-22 DIAGNOSIS — J45909 Unspecified asthma, uncomplicated: Secondary | ICD-10-CM | POA: Diagnosis not present

## 2014-09-22 DIAGNOSIS — Z79899 Other long term (current) drug therapy: Secondary | ICD-10-CM | POA: Insufficient documentation

## 2014-09-22 DIAGNOSIS — Y9389 Activity, other specified: Secondary | ICD-10-CM | POA: Diagnosis not present

## 2014-09-22 DIAGNOSIS — Y998 Other external cause status: Secondary | ICD-10-CM | POA: Insufficient documentation

## 2014-09-22 DIAGNOSIS — W1839XA Other fall on same level, initial encounter: Secondary | ICD-10-CM | POA: Insufficient documentation

## 2014-09-22 DIAGNOSIS — M25571 Pain in right ankle and joints of right foot: Secondary | ICD-10-CM

## 2014-09-22 DIAGNOSIS — T148XXA Other injury of unspecified body region, initial encounter: Secondary | ICD-10-CM

## 2014-09-22 IMAGING — CR DG FOOT COMPLETE 3+V*R*
3 series · 3 of 3 positions shown · non-contrast
Comparison: None.

CLINICAL DATA: Foot laceration, pain along anterior foot.

EXAM:
RIGHT FOOT COMPLETE - 3+ VIEW

[x foot ap right]
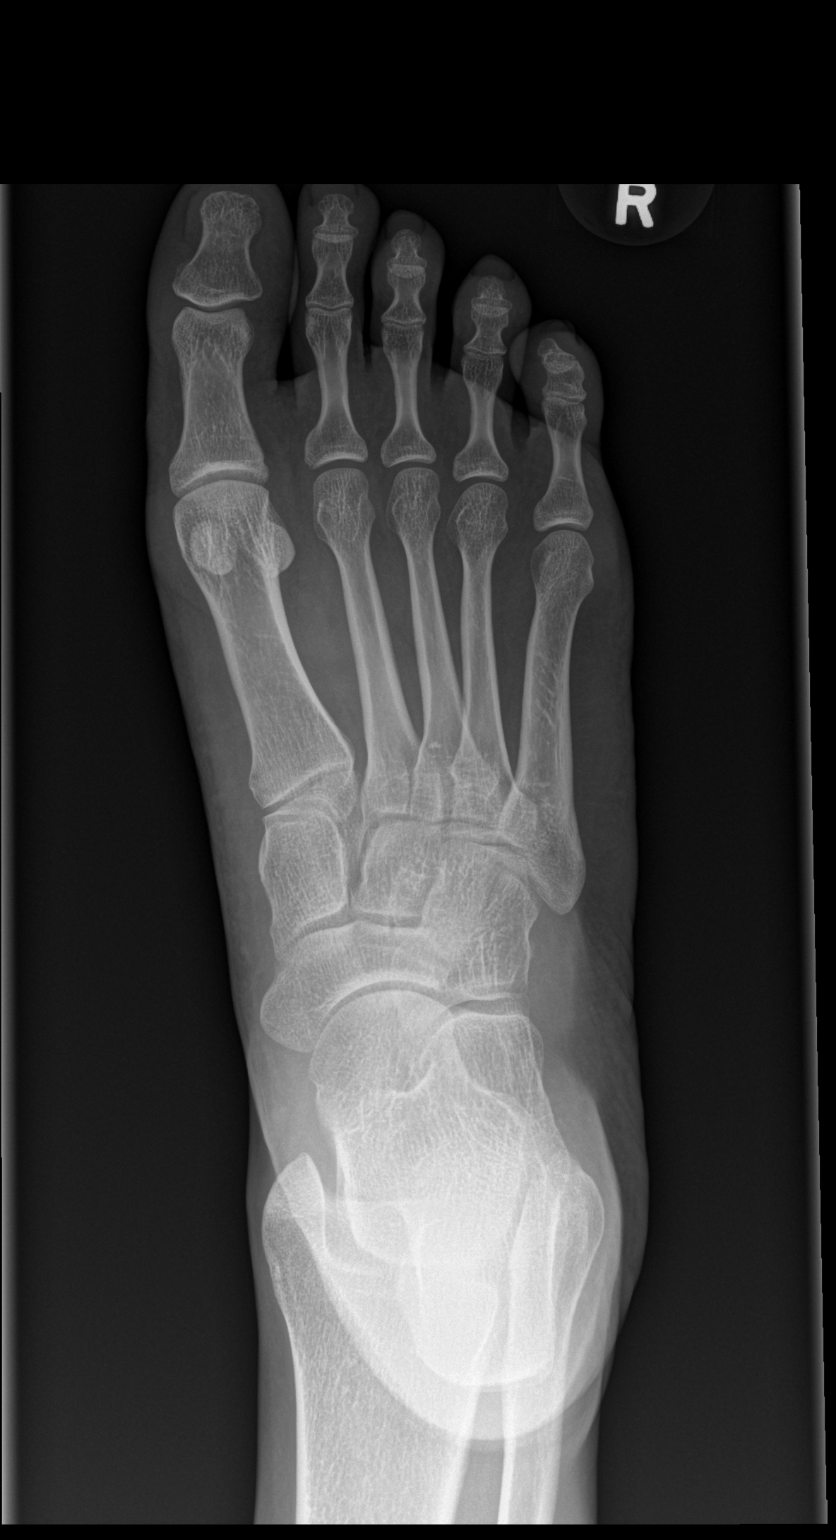

[x foot obl right]
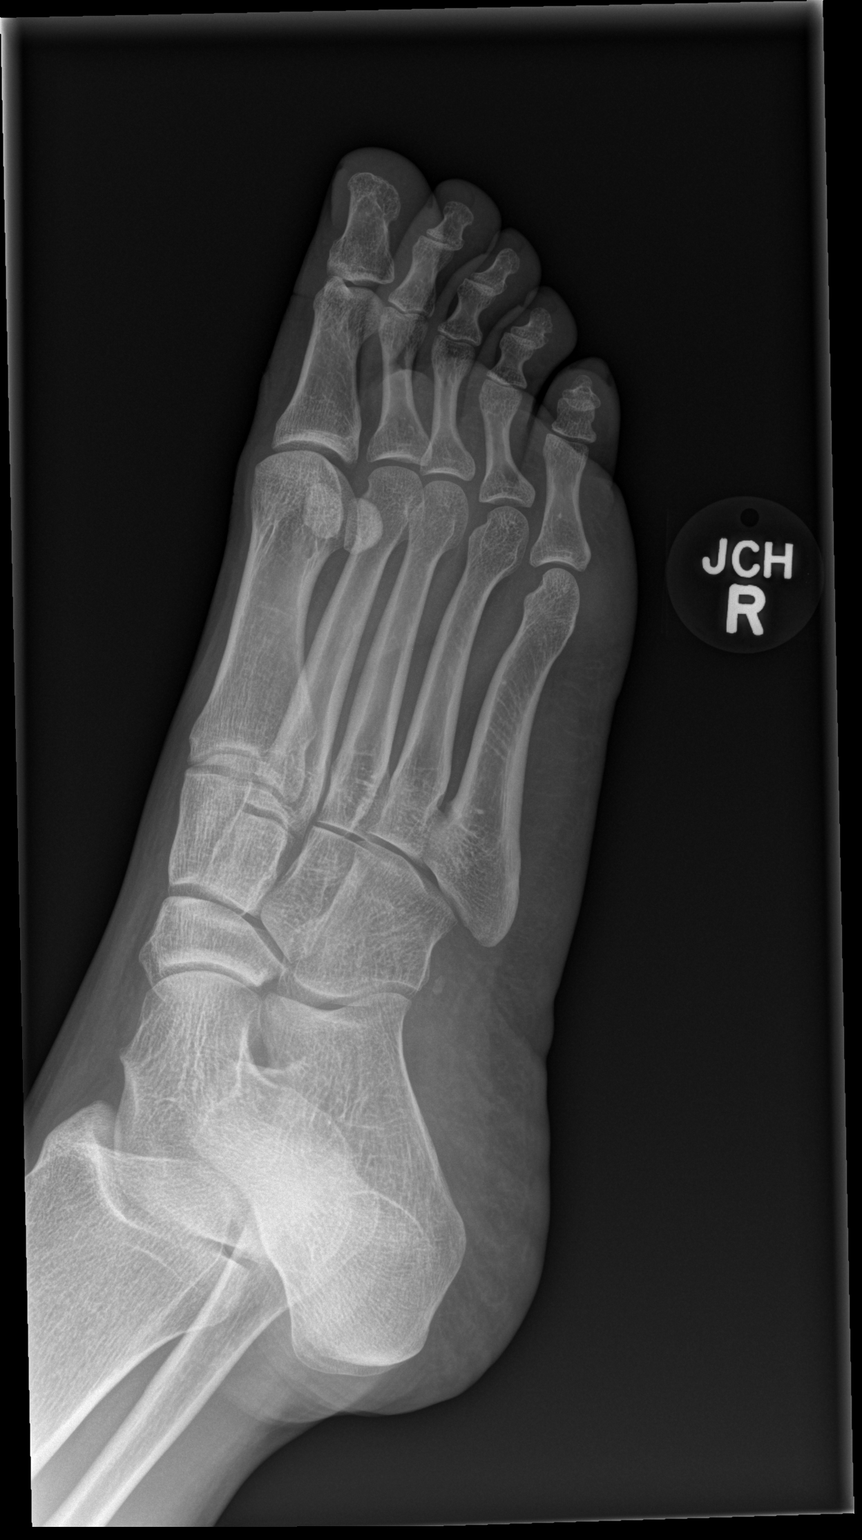

[x foot lat right]
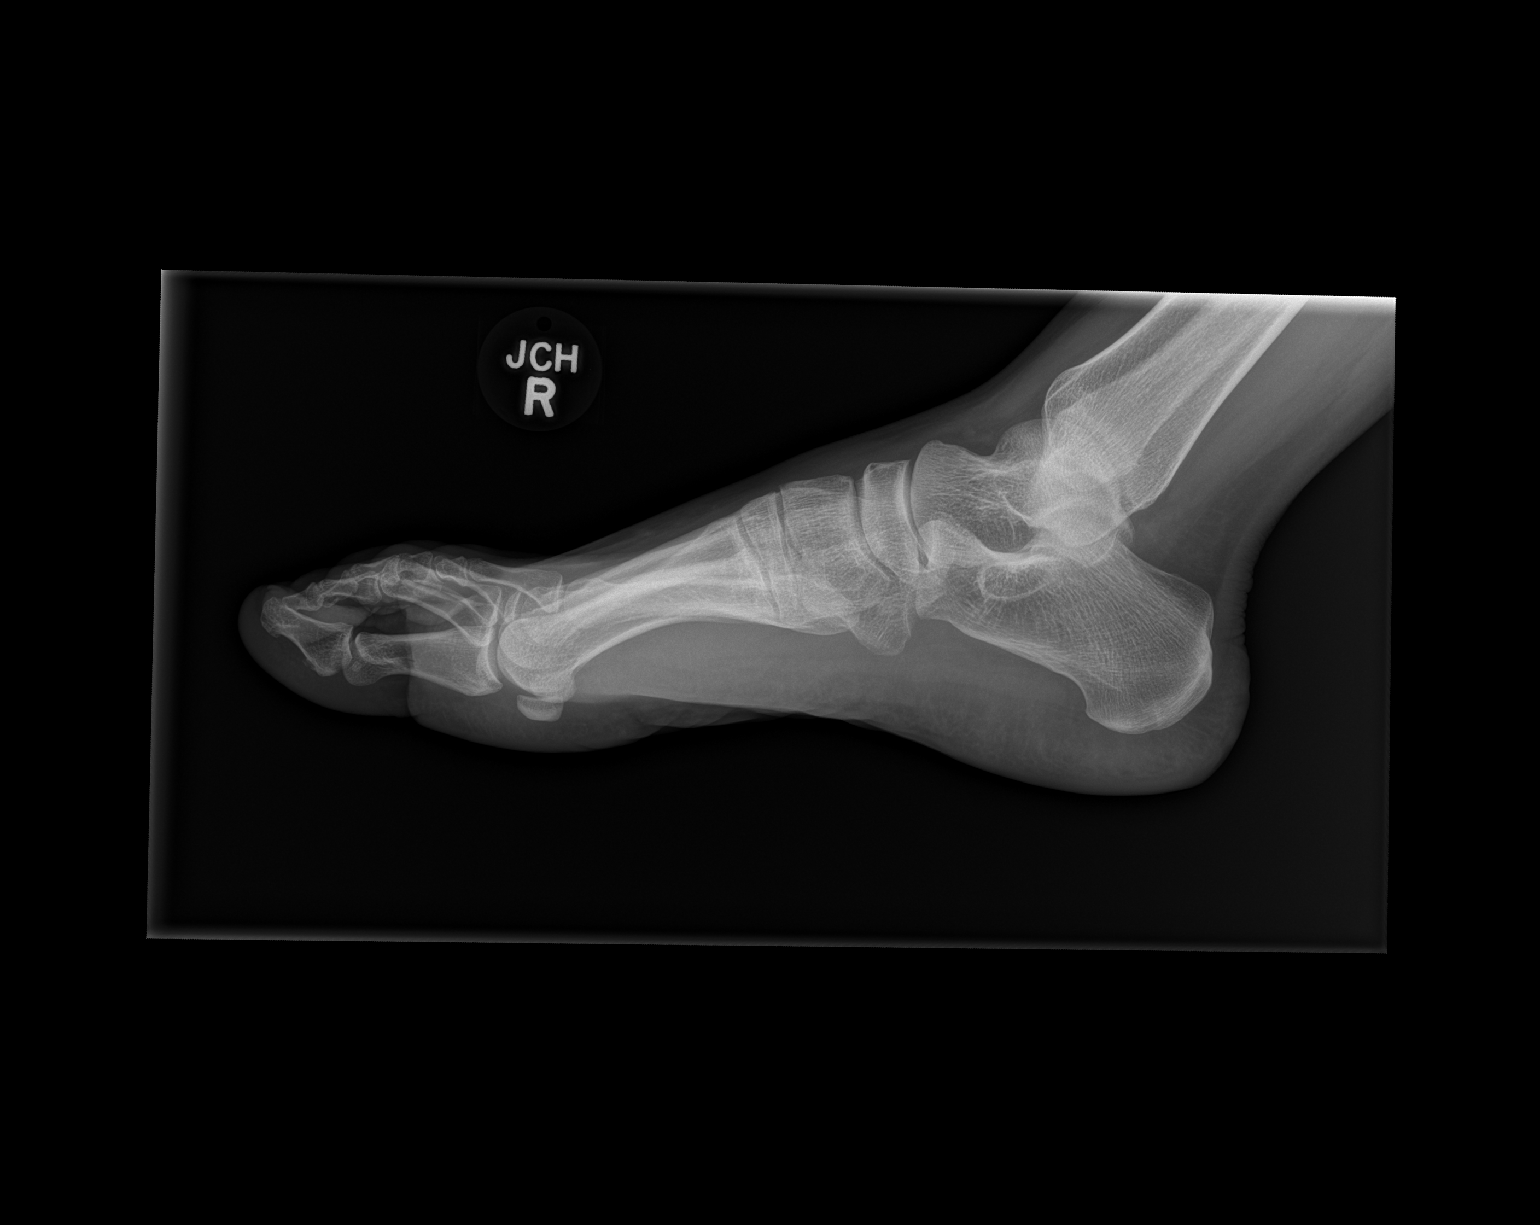

[3 of 3 positions shown; findings below may reference images not displayed]

FINDINGS: There is no evidence of fracture or dislocation. There is no
evidence of arthropathy or other focal bone abnormality. Soft
tissues are unremarkable.
IMPRESSION: Negative.

## 2014-09-22 IMAGING — CR DG KNEE COMPLETE 4+V*L*
4 series · 4 of 4 positions shown · non-contrast
Comparison: None.

CLINICAL DATA: Pain along medial aspect of LEFT knee, status post
fall.

EXAM:
LEFT KNEE - COMPLETE 4+ VIEW

[x knee ap left]
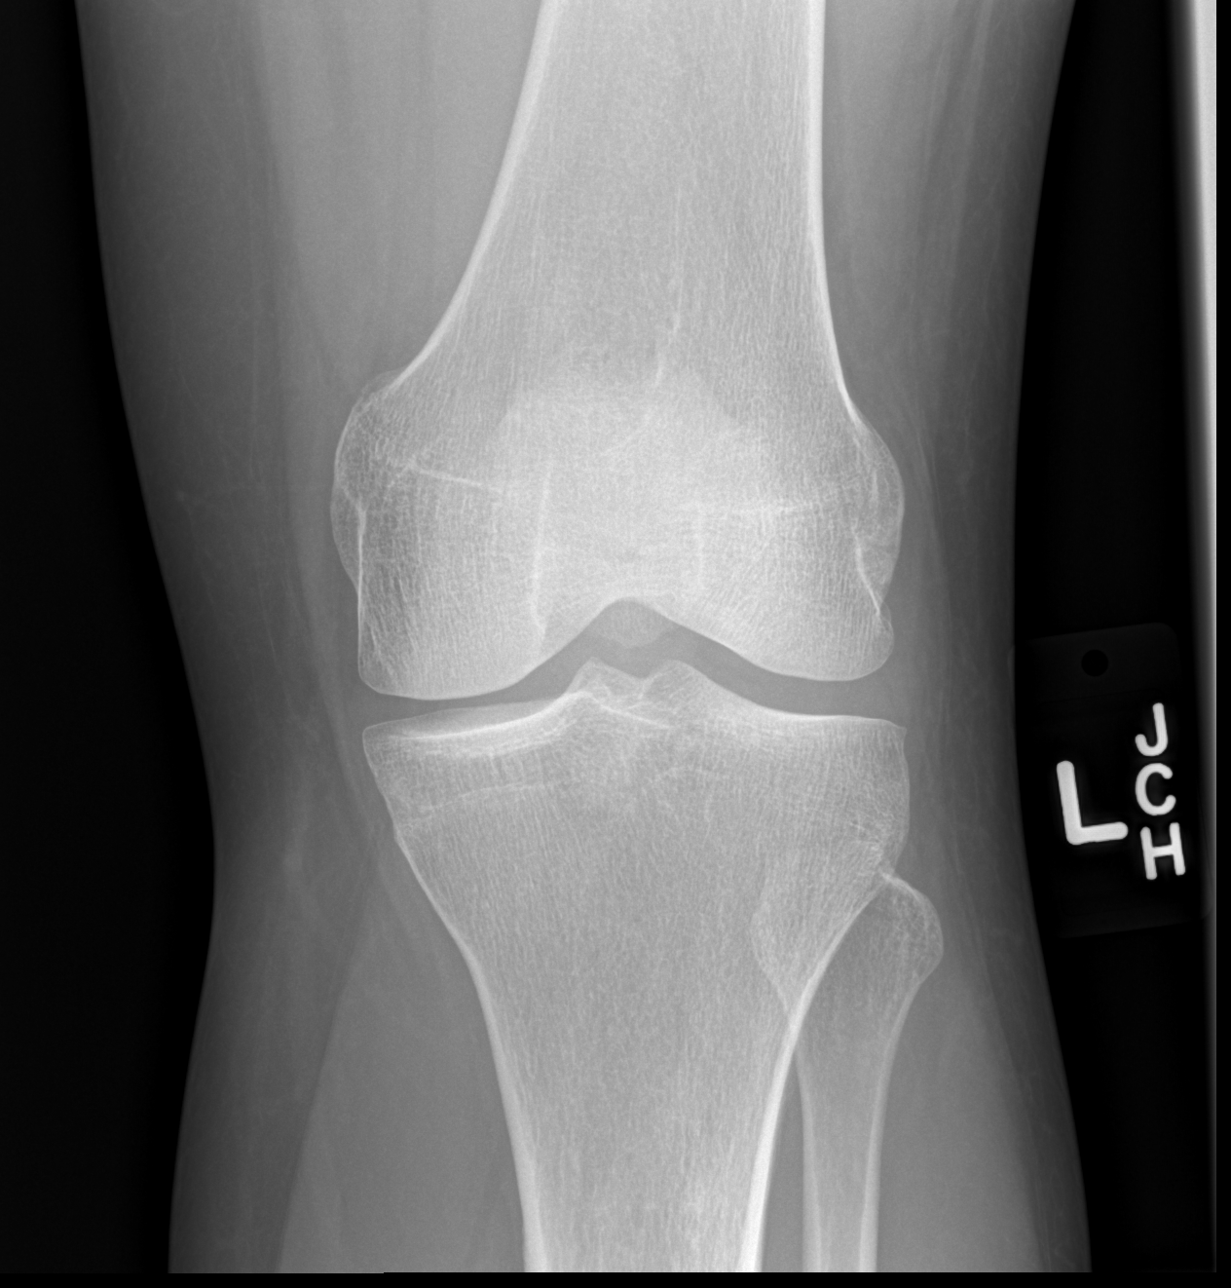

[x knee obl left (1 of 2)]
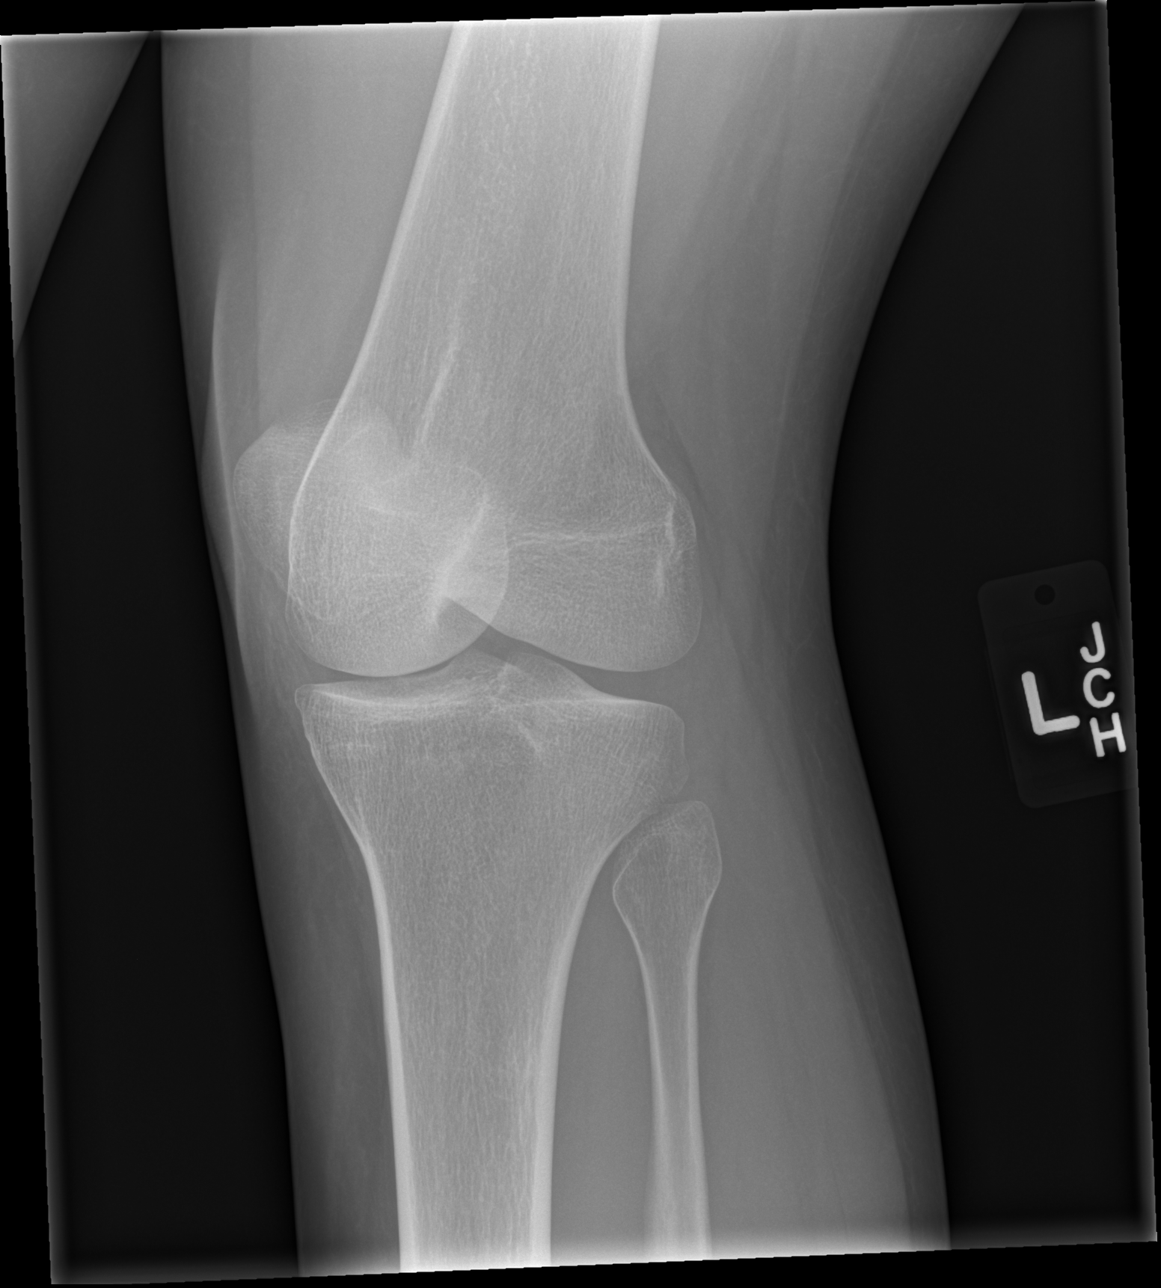

[x knee obl left (2 of 2)]
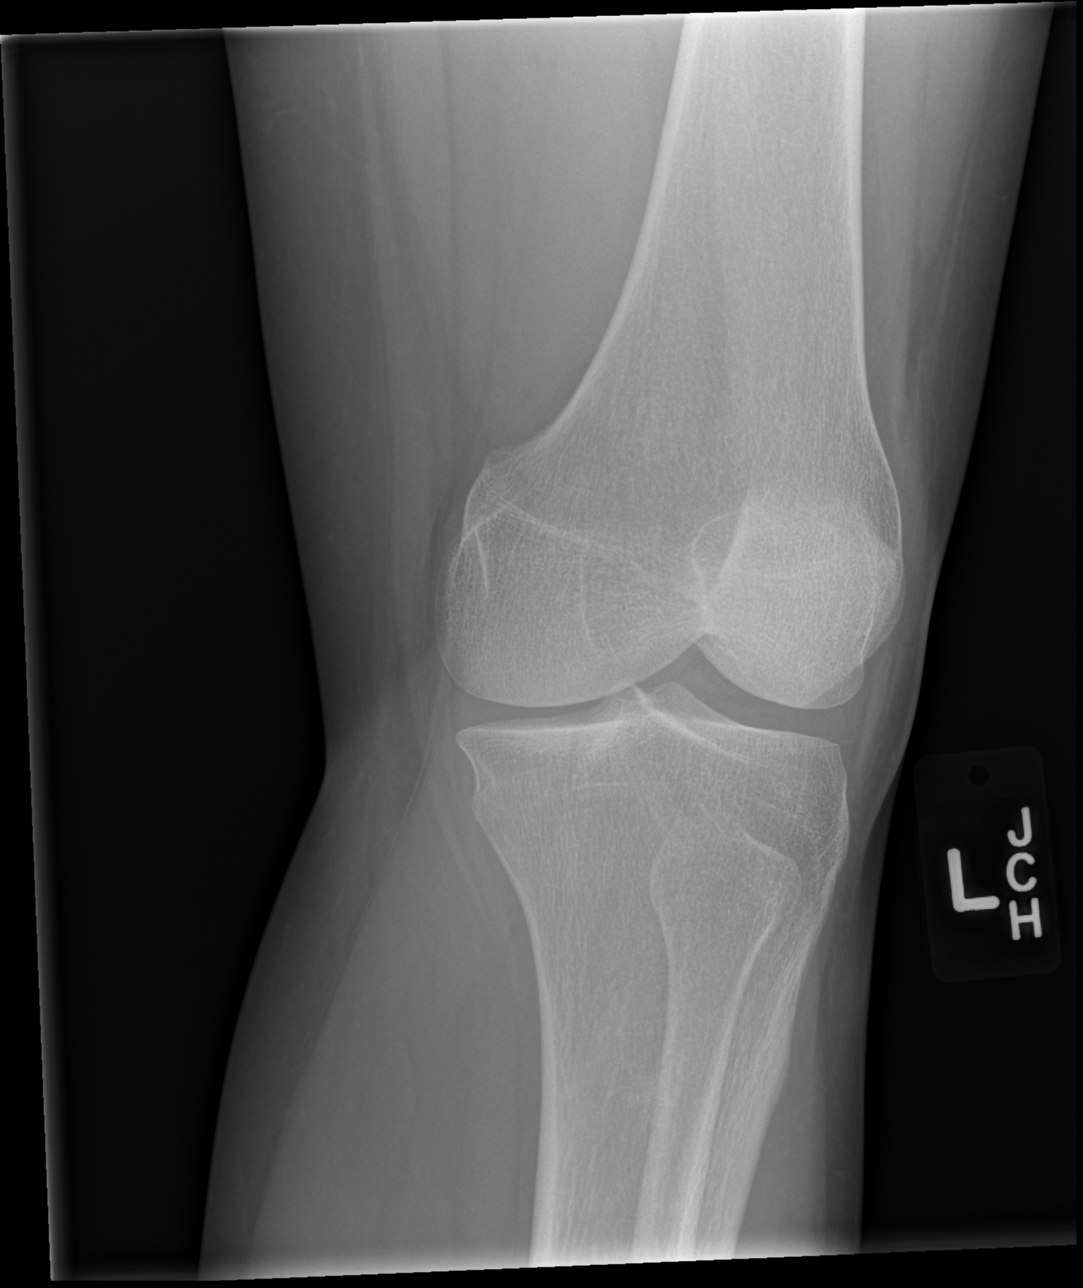

[x knee lat left]
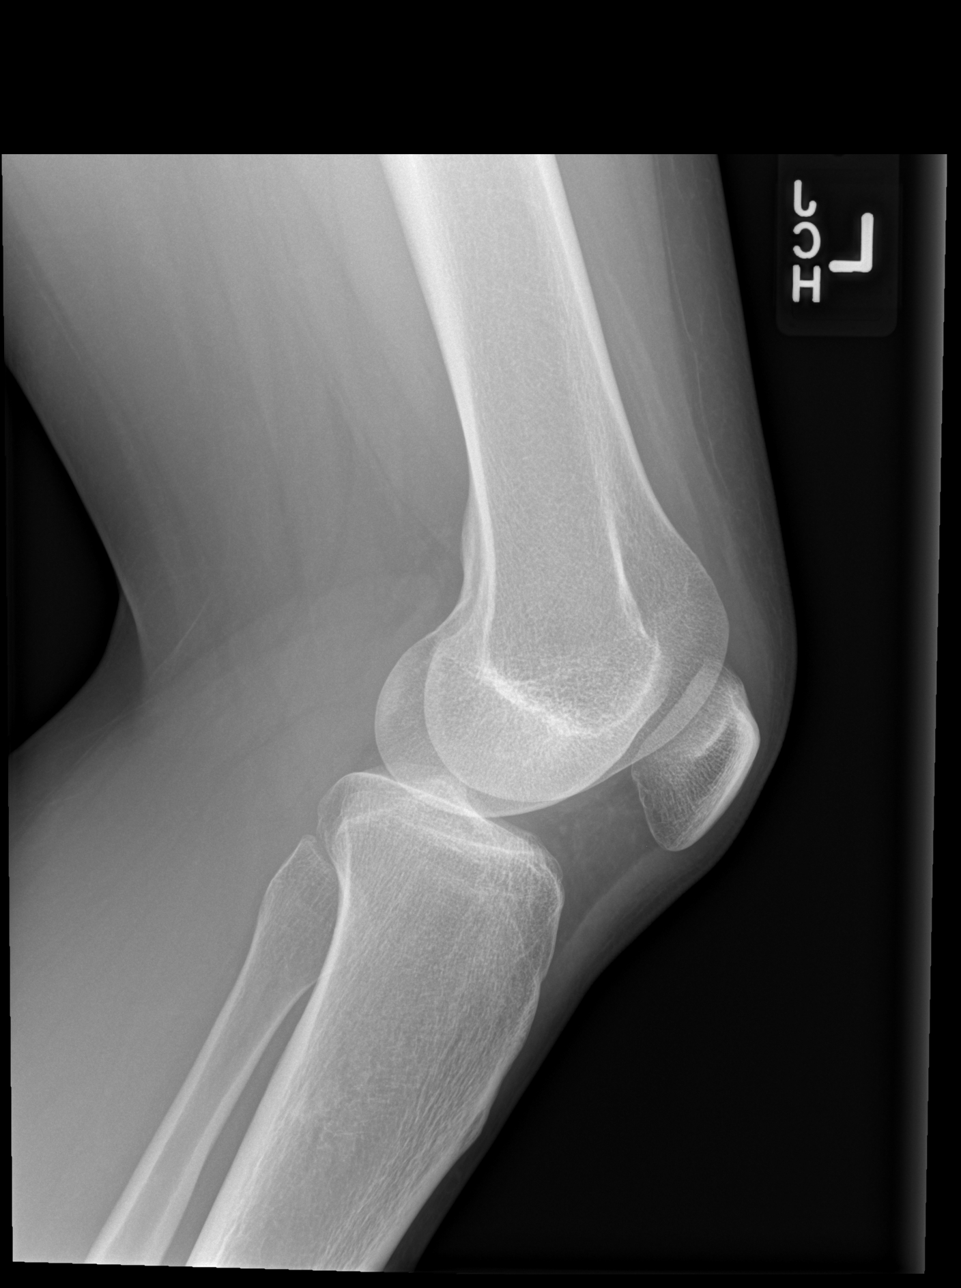

[4 of 4 positions shown; findings below may reference images not displayed]

FINDINGS: There is no evidence of fracture, dislocation, or joint effusion.
There is no evidence of arthropathy or other focal bone abnormality.
Soft tissues are unremarkable.
IMPRESSION: Negative.

## 2014-09-22 MED ORDER — ALBUTEROL SULFATE HFA 108 (90 BASE) MCG/ACT IN AERS
2.0000 | INHALATION_SPRAY | Freq: Once | RESPIRATORY_TRACT | Status: AC
  Administered 2014-09-22: 2 via RESPIRATORY_TRACT
  Filled 2014-09-22: qty 6.7

## 2014-09-22 MED ORDER — ACETAMINOPHEN 325 MG PO TABS
650.0000 mg | ORAL_TABLET | Freq: Once | ORAL | Status: AC
  Administered 2014-09-22: 650 mg via ORAL
  Filled 2014-09-22: qty 2

## 2014-09-22 MED ORDER — IBUPROFEN 800 MG PO TABS
800.0000 mg | ORAL_TABLET | Freq: Once | ORAL | Status: AC
  Administered 2014-09-22: 800 mg via ORAL
  Filled 2014-09-22: qty 1

## 2014-09-22 NOTE — ED Provider Notes (Signed)
CSN: [id number]     Arrival date & time 09/22/14  0524 History   First MD Initiated Contact with Patient 09/22/14 [phone]     Chief Complaint  Patient presents with  . Foot Pain  . Knee Pain     (Consider location/radiation/quality/duration/timing/severity/associated sxs/prior Treatment) Patient is a 24 y.o. female presenting with leg pain. The history is provided by the patient.  Leg Pain Location:  Foot and knee Time since incident:  4 hours Injury: yes   Mechanism of injury: fall   Fall:    Fall occurred:  Tripped   Impact surface:  Primary school teacher of impact:  Unable to specify   Entrapped after fall: no   Knee location:  R knee Foot location:  R foot Pain details:    Quality:  Aching and burning   Radiates to:  Does not radiate   Severity:  Moderate   Onset quality:  Sudden   Duration:  6 hours   Timing:  Constant   Progression:  Unchanged Chronicity:  New Dislocation: no   Foreign body present:  No foreign bodies Tetanus status:  Up to date Prior injury to area:  No Relieved by:  Nothing Worsened by:  Nothing tried Ineffective treatments:  None tried Associated symptoms: stiffness   Associated symptoms: no fever    24 yo F with a chief complaint of a fall. Patient was intoxicated not sure exactly how she fell. Complaining of right knee pain right pain. Patient feels that she is unable to ambulate on her right leg. Patient with a small abrasion to her right first toe. Feels that her Tdap is up to date.   Past Medical History  Diagnosis Date  . Asthma   . NVD (normal vaginal delivery) 08/30/2010  . Hx MRSA infection 2005  . Trichimoniasis   . Chlamydia   . Gonorrhea    Past Surgical History  Procedure Laterality Date  . No past surgeries    . Multiple tooth extractions     Family History  Problem Relation Age of Onset  . Anesthesia problems Neg Hx   . Other Neg Hx   . Diabetes Maternal Grandmother   . Hypertension Father   . Heart disease Father   .  Lung disease Father    History  Substance Use Topics  . Smoking status: Former Smoker -- 0.25 packs/day for 5 years    Types: Cigarettes    Quit date: 12/27/2012  . Smokeless tobacco: Never Used  . Alcohol Use: No   OB History    Gravida Para Term Preterm AB TAB SAB Ectopic Multiple Living   4 4 4       4      Review of Systems  Constitutional: Negative for fever and chills.  HENT: Negative for congestion and rhinorrhea.   Eyes: Negative for redness and visual disturbance.  Respiratory: Negative for shortness of breath and wheezing.   Cardiovascular: Negative for chest pain and palpitations.  Gastrointestinal: Negative for nausea and vomiting.  Genitourinary: Negative for dysuria and urgency.  Musculoskeletal: Positive for myalgias, arthralgias and stiffness.  Skin: Negative for pallor and wound.  Neurological: Negative for dizziness and headaches.      Allergies  Iodine and Sulfonamide derivatives  Home Medications   Prior to Admission medications   Medication Sig Start Date End Date Taking? Authorizing Provider  albuterol (PROVENTIL HFA;VENTOLIN HFA) 108 (90 BASE) MCG/ACT inhaler Inhale 2 puffs into the lungs every 4 (four) hours as needed for  wheezing or shortness of breath. 03/21/14  Yes Tilden Fossa, MD  betamethasone dipropionate (DIPROLENE) 0.05 % ointment Apply topically 2 (two) times daily. Patient not taking: Reported on 07/10/2014 02/28/14   Elpidio Anis, PA-C  medroxyPROGESTERone (DEPO-PROVERA) 150 MG/ML injection Inject 1 mL (150 mg total) into the muscle every 3 (three) months. Patient not taking: Reported on 03/21/2014 09/06/13   Carrington Clamp, MD  oxyCODONE-acetaminophen (PERCOCET/ROXICET) 5-325 MG per tablet Take 1-2 tablets by mouth every 4 (four) hours as needed for severe pain. Patient not taking: Reported on 09/22/2014 07/10/14   Lorre Nick, MD   BP 126/75 mmHg  Pulse 100  Temp(Src) 98.3 F (36.8 C) (Oral)  Resp 18  Ht  (1.6 m)  Wt 168 lb  (76.204 kg)  BMI 29.77 kg/m2  SpO2 100% Physical Exam  Constitutional: She is oriented to person, place, and time. She appears well-developed and well-nourished. No distress.  HENT:  Head: Normocephalic and atraumatic.  Eyes: EOM are normal. Pupils are equal, round, and reactive to light.  Neck: Normal range of motion. Neck supple.  Cardiovascular: Normal rate and regular rhythm.  Exam reveals no gallop and no friction rub.   No murmur heard. Pulmonary/Chest: Effort normal. She has no wheezes. She has no rales.  Abdominal: Soft. She exhibits no distension. There is no tenderness.  Musculoskeletal: She exhibits tenderness. She exhibits no edema.  Tender palpation about the right knee in the right foot. Diffusely tender. Full range of motion. Pulse motor and sensation intact distally. Patient with a small abrasion to the proximal aspect of her first digit of the right foot. Non-gaping not fully through the skin.  Neurological: She is alert and oriented to person, place, and time.  Skin: Skin is warm and dry. She is not diaphoretic.  Psychiatric: She has a normal mood and affect. Her behavior is normal.    ED Course  Procedures (including critical care time) Labs Review Labs Reviewed - No data to display  Imaging Review Dg Knee Complete 4 Views Left  09/22/2014   CLINICAL DATA:  Pain along medial aspect of LEFT knee, status post fall.  EXAM: LEFT KNEE - COMPLETE 4+ VIEW  COMPARISON:  None.  FINDINGS: There is no evidence of fracture, dislocation, or joint effusion. There is no evidence of arthropathy or other focal bone abnormality. Soft tissues are unremarkable.  IMPRESSION: Negative.   Electronically Signed   By: Awilda Metro M.D.   On: 09/22/2014 06:15   Dg Foot Complete Right  09/22/2014   CLINICAL DATA:  Foot laceration, pain along anterior foot.  EXAM: RIGHT FOOT COMPLETE - 3+ VIEW  COMPARISON:  None.  FINDINGS: There is no evidence of fracture or dislocation. There is no evidence  of arthropathy or other focal bone abnormality. Soft tissues are unremarkable.  IMPRESSION: Negative.   Electronically Signed   By: Awilda Metro M.D.   On: 09/22/2014 06:14     EKG Interpretation None      MDM   Final diagnoses:  Pain    24 yo F with a chief complaint of a fall. Right knee x-ray negative for fracture and viewed by me. Small abrasion to the foot not totally through the skin and need for repair. Since patient feels she cannot walk we'll put her in a knee immobilizer and give her crutches. Upon discharge patient stating that she's suddenly having difficulty with her asthma and that she doesn't have an inhaler at home and so would like one here.  7:45 AM:  I have discussed the diagnosis/risks/treatment options with the patient and believe the pt to be eligible for discharge home to follow-up with PCP. We also discussed returning to the ED immediately if new or worsening sx occur. We discussed the sx which are most concerning (e.g., sudden worsening pain) that necessitate immediate return. Medications administered to the patient during their visit and any new prescriptions provided to the patient are listed below.  Medications given during this visit Medications  albuterol (PROVENTIL HFA;VENTOLIN HFA) 108 (90 BASE) MCG/ACT inhaler 2 puff (not administered)  ibuprofen (ADVIL,MOTRIN) tablet 800 mg (not administered)  acetaminophen (TYLENOL) tablet 650 mg (not administered)    New Prescriptions   No medications on file     The patient appears reasonably screen and/or stabilized for discharge and I doubt any other medical condition or other [facility] requiring further screening, evaluation, or treatment in the ED at this time prior to discharge.      Melene Plan, DO 09/22/14 0745

## 2014-09-22 NOTE — Discharge Instructions (Signed)
Take 4 over the counter ibuprofen tablets 3 times a day or 2 over-the-counter naproxen tablets twice a day for pain.  Arthralgia Your caregiver has diagnosed you as suffering from an arthralgia. Arthralgia means there is pain in a joint. This can come from many reasons including:  Bruising the joint which causes soreness (inflammation) in the joint.  Wear and tear on the joints which occur as we grow older (osteoarthritis).  Overusing the joint.  Various forms of arthritis.  Infections of the joint. Regardless of the cause of pain in your joint, most of these different pains respond to anti-inflammatory drugs and rest. The exception to this is when a joint is infected, and these cases are treated with antibiotics, if it is a bacterial infection. HOME CARE INSTRUCTIONS   Rest the injured area for as long as directed by your caregiver. Then slowly start using the joint as directed by your caregiver and as the pain allows. Crutches as directed may be useful if the ankles, knees or hips are involved. If the knee was splinted or casted, continue use and care as directed. If an stretchy or elastic wrapping bandage has been applied today, it should be removed and re-applied every 3 to 4 hours. It should not be applied tightly, but firmly enough to keep swelling down. Watch toes and feet for swelling, bluish discoloration, coldness, numbness or excessive pain. If any of these problems (symptoms) occur, remove the ace bandage and re-apply more loosely. If these symptoms persist, contact your caregiver or return to this location.  For the first 24 hours, keep the injured extremity elevated on pillows while lying down.  Apply ice for 15-20 minutes to the sore joint every couple hours while awake for the first half day. Then 03-04 times per day for the first 48 hours. Put the ice in a plastic bag and place a towel between the bag of ice and your skin.  Wear any splinting, casting, elastic bandage  applications, or slings as instructed.  Only take over-the-counter or prescription medicines for pain, discomfort, or fever as directed by your caregiver. Do not use aspirin immediately after the injury unless instructed by your physician. Aspirin can cause increased bleeding and bruising of the tissues.  If you were given crutches, continue to use them as instructed and do not resume weight bearing on the sore joint until instructed. Persistent pain and inability to use the sore joint as directed for more than 2 to 3 days are warning signs indicating that you should see a caregiver for a follow-up visit as soon as possible. Initially, a hairline fracture (break in bone) may not be evident on X-rays. Persistent pain and swelling indicate that further evaluation, non-weight bearing or use of the joint (use of crutches or slings as instructed), or further X-rays are indicated. X-rays may sometimes not show a small fracture until a week or 10 days later. Make a follow-up appointment with your own caregiver or one to whom we have referred you. A radiologist (specialist in reading X-rays) may read your X-rays. Make sure you know how you are to obtain your X-ray results. Do not assume everything is normal if you do not hear from [place]. SEEK MEDICAL CARE IF: Bruising, swelling, or pain increases. SEEK IMMEDIATE MEDICAL CARE IF:   Your fingers or toes are numb or blue.  The pain is not responding to medications and continues to stay the same or get worse.  The pain in your joint becomes severe.  You  develop a fever over 102 F (38.9 C).  It becomes impossible to move or use the joint. MAKE SURE YOU:   Understand these instructions.  Will watch your condition.  Will get help right away if you are not doing well or get worse. Document Released: 02/01/2005 Document Revised: 04/26/2011 Document Reviewed: 09/20/2007 [facility] Patient Information 2015 [place], [place]. This information is not intended to  replace advice given to you by your health care provider. Make sure you discuss any questions you have with your health care provider.

## 2014-09-22 NOTE — ED Notes (Signed)
Attempted to discharge patient, however she complains of pain as well as asthma exacerbation, sts she needs an inhaler because she left hers at home. Lung sounds auscultated and clear bilaterally, dr Adela Lank notified.

## 2014-09-22 NOTE — ED Notes (Signed)
Pt arrived to the ED with a complaint of a foot laceration and knee pain after having a fall.  Pt denies LOC after the fall.  Pt states she doesn't remember the fall due to her drinking alcohol.

## 2014-12-15 ENCOUNTER — Emergency Department (HOSPITAL_COMMUNITY)
Admission: EM | Admit: 2014-12-15 | Discharge: 2014-12-15 | Disposition: A | Discharge status: Home / Self Care | Payer: Medicaid Other | Attending: Emergency Medicine | Admitting: Emergency Medicine

## 2014-12-15 ENCOUNTER — Encounter (HOSPITAL_COMMUNITY): Payer: Self-pay | Admitting: Oncology

## 2014-12-15 DIAGNOSIS — Z7952 Long term (current) use of systemic steroids: Secondary | ICD-10-CM | POA: Insufficient documentation

## 2014-12-15 DIAGNOSIS — Z8619 Personal history of other infectious and parasitic diseases: Secondary | ICD-10-CM | POA: Diagnosis not present

## 2014-12-15 DIAGNOSIS — Z7951 Long term (current) use of inhaled steroids: Secondary | ICD-10-CM | POA: Insufficient documentation

## 2014-12-15 DIAGNOSIS — Z79899 Other long term (current) drug therapy: Secondary | ICD-10-CM | POA: Insufficient documentation

## 2014-12-15 DIAGNOSIS — Z87891 Personal history of nicotine dependence: Secondary | ICD-10-CM | POA: Insufficient documentation

## 2014-12-15 DIAGNOSIS — J45901 Unspecified asthma with (acute) exacerbation: Secondary | ICD-10-CM

## 2014-12-15 DIAGNOSIS — J45909 Unspecified asthma, uncomplicated: Secondary | ICD-10-CM | POA: Diagnosis present

## 2014-12-15 DIAGNOSIS — Z8614 Personal history of Methicillin resistant Staphylococcus aureus infection: Secondary | ICD-10-CM | POA: Diagnosis not present

## 2014-12-15 DIAGNOSIS — L309 Dermatitis, unspecified: Secondary | ICD-10-CM | POA: Diagnosis not present

## 2014-12-15 MED ORDER — DIPHENHYDRAMINE HCL 25 MG PO CAPS
25.0000 mg | ORAL_CAPSULE | Freq: Once | ORAL | Status: AC
Start: 2014-12-15 — End: 2014-12-15
  Administered 2014-12-15: 25 mg via ORAL
  Filled 2014-12-15: qty 1

## 2014-12-15 MED ORDER — TRIAMCINOLONE ACETONIDE 0.1 % EX CREA: 1.0000 "application " | TOPICAL_CREAM | Freq: Two times a day (BID) | CUTANEOUS | Status: DC

## 2014-12-15 MED ORDER — ALBUTEROL SULFATE HFA 108 (90 BASE) MCG/ACT IN AERS
1.0000 | INHALATION_SPRAY | RESPIRATORY_TRACT | Status: DC | PRN
  Administered 2014-12-15: 2 via RESPIRATORY_TRACT
  Filled 2014-12-15: qty 6.7

## 2014-12-15 MED ORDER — PREDNISONE 20 MG PO TABS
60.0000 mg | ORAL_TABLET | Freq: Once | ORAL | Status: AC
  Administered 2014-12-15: 60 mg via ORAL
  Filled 2014-12-15: qty 3

## 2014-12-15 MED ORDER — PREDNISONE 10 MG PO TABS: 40.0000 mg | ORAL_TABLET | Freq: Every day | ORAL | Status: DC

## 2014-12-15 MED ORDER — IPRATROPIUM-ALBUTEROL 0.5-2.5 (3) MG/3ML IN SOLN
3.0000 mL | Freq: Once | RESPIRATORY_TRACT | Status: AC
  Administered 2014-12-15: 3 mL via RESPIRATORY_TRACT
  Filled 2014-12-15: qty 3

## 2014-12-15 MED ORDER — BECLOMETHASONE DIPROPIONATE 40 MCG/ACT IN AERS
1.0000 | INHALATION_SPRAY | Freq: Two times a day (BID) | RESPIRATORY_TRACT | Status: DC
Start: 2014-12-15 — End: 2017-07-09

## 2014-12-15 NOTE — ED Provider Notes (Signed)
CSN: [id number]     Arrival date & time 12/15/14  0620 History   First MD Initiated Contact with Patient 12/15/14 [phone]     Chief Complaint  Patient presents with  . Asthma  . Rash     (Consider location/radiation/quality/duration/timing/severity/associated sxs/prior Treatment) HPI Comments: 24 y.o. Female with history of asthma presents for asthma exacerbation.  Patient reports that she has been out of her inhalers and not able to get in with a primary care doctor to get refills. She denies fever, chills, sputum production.  She says that her asthma is usually bad this time of year and that her symptoms have been progressively getting worse over the last couple of weeks.  Patient states that since receiving a breathing treatment from EMS en route she feels significantly better. The patient also reports that since yesterday she has developed an itchy rash over her back.  She does report being outside yesterday shirtless and at times laying in the grass.  She denies ever having a rash like this before.  She said it looks like little pimples on her back.  Patient is a 24 y.o. female presenting with asthma and rash.  Asthma Associated symptoms include shortness of breath. Pertinent negatives include no chest pain, no abdominal pain and no headaches.  Rash Associated symptoms: shortness of breath and wheezing   Associated symptoms: no abdominal pain, no diarrhea, no fever, no headaches, no myalgias, no nausea and not vomiting     Past Medical History  Diagnosis Date  . Asthma   . NVD (normal vaginal delivery) 08/30/2010  . Hx MRSA infection 2005  . Trichimoniasis   . Chlamydia   . Gonorrhea    Past Surgical History  Procedure Laterality Date  . No past surgeries    . Multiple tooth extractions     Family History  Problem Relation Age of Onset  . Anesthesia problems Neg Hx   . Other Neg Hx   . Diabetes Maternal Grandmother   . Hypertension Father   . Heart disease Father   . Lung  disease Father    Social History  Substance Use Topics  . Smoking status: Former Smoker -- 0.25 packs/day for 5 years    Types: Cigarettes    Quit date: 12/27/2012  . Smokeless tobacco: Never Used  . Alcohol Use: No   OB History    Gravida Para Term Preterm AB TAB SAB Ectopic Multiple Living   4 4 4       4      Review of Systems  Constitutional: Negative for fever, chills and appetite change.  HENT: Negative for congestion, nosebleeds, postnasal drip and rhinorrhea.   Eyes: Negative for pain and redness.  Respiratory: Positive for cough, shortness of breath and wheezing. Negative for chest tightness.   Cardiovascular: Negative for chest pain, palpitations and leg swelling.  Gastrointestinal: Negative for nausea, vomiting, abdominal pain and diarrhea.  Genitourinary: Negative for dysuria, urgency, frequency and flank pain.  Musculoskeletal: Negative for myalgias and back pain.  Skin: Positive for rash. Negative for wound.  Neurological: Negative for dizziness, weakness, light-headedness and headaches.      Allergies  Iodine and Sulfonamide derivatives  Home Medications   Prior to Admission medications   Medication Sig Start Date End Date Taking? Authorizing Provider  albuterol (PROVENTIL HFA;VENTOLIN HFA) 108 (90 BASE) MCG/ACT inhaler Inhale 2 puffs into the lungs every 4 (four) hours as needed for wheezing or shortness of breath. 03/21/14  Yes Tilden FossaElizabeth Rees, MD  albuterol (PROVENTIL) (2.5 MG/3ML) 0.083% nebulizer solution Inhale 3 mLs into the lungs every 6 (six) hours as needed. For wheezing 10/23/14  Yes Historical Provider, MD  beclomethasone (QVAR) 40 MCG/ACT inhaler Inhale 1 puff into the lungs 2 (two) times daily. 12/15/14   Leta Baptist, MD  betamethasone dipropionate (DIPROLENE) 0.05 % ointment Apply topically 2 (two) times daily. Patient not taking: Reported on 07/10/2014 02/28/14   Elpidio Anis, PA-C  medroxyPROGESTERone (DEPO-PROVERA) 150 MG/ML injection Inject 1  mL (150 mg total) into the muscle every 3 (three) months. Patient not taking: Reported on 03/21/2014 09/06/13   Carrington Clamp, MD  oxyCODONE-acetaminophen (PERCOCET/ROXICET) 5-325 MG per tablet Take 1-2 tablets by mouth every 4 (four) hours as needed for severe pain. Patient not taking: Reported on 09/22/2014 07/10/14   Lorre Nick, MD  predniSONE (DELTASONE) 10 MG tablet Take 4 tablets (40 mg total) by mouth daily. 12/15/14   Leta Baptist, MD  triamcinolone cream (KENALOG) 0.1 % Apply 1 application topically 2 (two) times daily. 12/15/14   Leta Baptist, MD   BP 110/70 mmHg  Pulse 88  Temp(Src) 97.9 F (36.6 C) (Oral)  Resp 16  Ht  (1.651 m)  Wt 175 lb (79.379 kg)  BMI 29.12 kg/m2  SpO2 97%  LMP  Physical Exam  Constitutional: She is oriented to person, place, and time. She appears well-developed and well-nourished. No distress.  HENT:  Head: Normocephalic and atraumatic.  Right Ear: External ear normal.  Left Ear: External ear normal.  Nose: Nose normal.  Mouth/Throat: Oropharynx is clear and moist. No oropharyngeal exudate.  Eyes: EOM are normal. Pupils are equal, round, and reactive to light.  Neck: Normal range of motion. Neck supple.  Cardiovascular: Normal rate, regular rhythm, normal heart sounds and intact distal pulses.   No murmur heard. Pulmonary/Chest: Effort normal. No respiratory distress. She has wheezes (few scattered). She has no rales.  Abdominal: Soft. She exhibits no distension. There is no tenderness.  Musculoskeletal: Normal range of motion. She exhibits no edema or tenderness.  Neurological: She is alert and oriented to person, place, and time.  Skin: Skin is warm and dry. Rash noted. Rash is pustular (mutliple patches of erythematous skin with pustules/papules present, excoriation present where these have been scratched, no blanching or desquamation ). She is not diaphoretic.  Vitals reviewed.   ED Course  Procedures (including critical care  time) Labs Review Labs Reviewed - No data to display  Imaging Review No results found. I have personally reviewed and evaluated these images and lab results as part of my medical decision-making.   EKG Interpretation None      MDM  Patient seen and evaluated in stable condition.  Rash concerning for contact dermatitis - does not appear viral, possible poison ivy/oak.  Breathing stable.  Patient given Benadryl, breathing treatment, prednisone.  She was provided an albuterol inhaler.  She was discharged with prescription for triamcinolone cream, prednisone, and a refill for her QVar.  Clinical impression and need for follow up discussed at length with patient who expressed understanding and agreement with plan of care.  Patient discharged in stable condition.  All questions answered prior to discharge. Final diagnoses:  Dermatitis  Asthma, unspecified asthma severity, with acute exacerbation    1. Asthma exacerbation  2. Dermatitis    Leta Baptist, MD 12/15/14 2227

## 2014-12-15 NOTE — ED Notes (Signed)
Bed: [id number] Expected date:  Expected time:  Means of arrival:  Comments: EMS asthma Wheezing

## 2014-12-15 NOTE — ED Notes (Signed)
Pt presents d/t asthma attack.  Pt given 5 mg of albuterol en route.  Per EMS no wheezing noted on auscultation post neb.

## 2014-12-15 NOTE — Discharge Instructions (Signed)
Asthma, Acute Bronchospasm  Take the steroids as prescribed.  Fill the QVar and take it as prescribed.  Follow up outpatient for continued management.  Acute bronchospasm caused by asthma is also referred to as an asthma attack. Bronchospasm means your air passages become narrowed. The narrowing is caused by inflammation and tightening of the muscles in the air tubes (bronchi) in your lungs. This can make it hard to breathe or cause you to wheeze and cough. CAUSES Possible triggers are:  Animal dander from the skin, hair, or feathers of animals.  Dust mites contained in house dust.  Cockroaches.  Pollen from trees or grass.  Mold.  Cigarette or tobacco smoke.  Air pollutants such as dust, household cleaners, hair sprays, aerosol sprays, paint fumes, strong chemicals, or strong odors.  Cold air or weather changes. Cold air may trigger inflammation. Winds increase molds and pollens in the air.  Strong emotions such as crying or laughing hard.  Stress.  Certain medicines such as aspirin or beta-blockers.  Sulfites in foods and drinks, such as dried fruits and wine.  Infections or inflammatory conditions, such as a flu, cold, or inflammation of the nasal membranes (rhinitis).  Gastroesophageal reflux disease (GERD). GERD is a condition where stomach acid backs up into your esophagus.  Exercise or strenuous activity. SIGNS AND SYMPTOMS   Wheezing.  Excessive coughing, particularly at night.  Chest tightness.  Shortness of breath. DIAGNOSIS  Your health care provider will ask you about your medical history and perform a physical exam. A chest X-ray or blood testing may be performed to look for other causes of your symptoms or other conditions that may have triggered your asthma attack. TREATMENT  Treatment is aimed at reducing inflammation and opening up the airways in your lungs. Most asthma attacks are treated with inhaled medicines. These include quick relief or rescue  medicines (such as bronchodilators) and controller medicines (such as inhaled corticosteroids). These medicines are sometimes given through an inhaler or a nebulizer. Systemic steroid medicine taken by mouth or given through an IV tube also can be used to reduce the inflammation when an attack is moderate or severe. Antibiotic medicines are only used if a bacterial infection is present.  HOME CARE INSTRUCTIONS   Rest.  Drink plenty of liquids. This helps the mucus to remain thin and be easily coughed up. Only use caffeine in moderation and do not use alcohol until you have recovered from your illness.  Do not smoke. Avoid being exposed to secondhand smoke.  You play a critical role in keeping yourself in good health. Avoid exposure to things that cause you to wheeze or to have breathing problems.  Keep your medicines up-to-date and available. Carefully follow your health care provider's treatment plan.  Take your medicine exactly as prescribed.  When pollen or pollution is bad, keep windows closed and use an air conditioner or go to places with air conditioning.  Asthma requires careful medical care. See your health care provider for a follow-up as advised. If you are more than [redacted] weeks pregnant and you were prescribed any new medicines, let your obstetrician know about the visit and how you are doing. Follow up with your health care provider as directed.  After you have recovered from your asthma attack, make an appointment with your outpatient doctor to talk about ways to reduce the likelihood of future attacks. If you do not have a doctor who manages your asthma, make an appointment with a primary care doctor to discuss  your asthma. SEEK IMMEDIATE MEDICAL CARE IF:   You are getting worse.  You have trouble breathing. If severe, call your local emergency services (911 in the [place]  You develop chest pain or discomfort.  You are vomiting.  You are not able to keep fluids down.  You  are coughing up yellow, green, brown, or bloody sputum.  You have a fever and your symptoms suddenly get worse.  You have trouble swallowing. MAKE SURE YOU:   Understand these instructions.  Will watch your condition.  Will get help right away if you are not doing well or get worse.   This information is not intended to replace advice given to you by your health care provider. Make sure you discuss any questions you have with your health care provider.   Document Released: 05/19/2006 Document Revised: 02/06/2013 Document Reviewed: 08/09/2012 Elsevier Interactive Patient Education 2016 [employer]  Contact Dermatitis  Your rash appears to be secondary to contact with something causing an allergic reaction in your skin.  Do not scratch the area - this can be spread if contact is made with the area and unaffected skin.  Use the cream as prescribed and take benadryl as needed.  Follow up for evaluation in a few days outpatient.  Dermatitis is redness, soreness, and swelling (inflammation) of the skin. Contact dermatitis is a reaction to certain substances that touch the skin. There are two types of contact dermatitis:   Irritant contact dermatitis. This type is caused by something that irritates your skin, such as dry hands from washing them too much. This type does not require previous exposure to the substance for a reaction to occur. This type is more common.  Allergic contact dermatitis. This type is caused by a substance that you are allergic to, such as a nickel allergy or poison ivy. This type only occurs if you have been exposed to the substance (allergen) before. Upon a repeat exposure, your body reacts to the substance. This type is less common. CAUSES  Many different substances can cause contact dermatitis. Irritant contact dermatitis is most commonly caused by exposure to:   Makeup.   Soaps.   Detergents.   Bleaches.   Acids.   Metal salts, such as nickel.   Allergic contact dermatitis is most commonly caused by exposure to:   Poisonous plants.   Chemicals.   Jewelry.   Latex.   Medicines.   Preservatives in products, such as clothing.  RISK FACTORS This condition is more likely to develop in:   People who have jobs that expose them to irritants or allergens.  People who have certain medical conditions, such as asthma or eczema.  SYMPTOMS  Symptoms of this condition may occur anywhere on your body where the irritant has touched you or is touched by you. Symptoms include:  Dryness or flaking.   Redness.   Cracks.   Itching.   Pain or a burning feeling.   Blisters.  Drainage of small amounts of blood or clear fluid from skin cracks. With allergic contact dermatitis, there may also be swelling in areas such as the eyelids, mouth, or genitals.  DIAGNOSIS  This condition is diagnosed with a medical history and physical exam. A patch skin test may be performed to help determine the cause. If the condition is related to your job, you may need to see an occupational medicine specialist. TREATMENT Treatment for this condition includes figuring out what caused the reaction and protecting your skin from further contact. Treatment  may also include:   Steroid creams or ointments. Oral steroid medicines may be needed in more severe cases.  Antibiotics or antibacterial ointments, if a skin infection is present.  Antihistamine lotion or an antihistamine taken by mouth to ease itching.  A bandage (dressing). HOME CARE INSTRUCTIONS Skin Care  Moisturize your skin as needed.   Apply cool compresses to the affected areas.  Try taking a bath with:  Epsom salts. Follow the instructions on the packaging. You can get these at your local pharmacy or grocery store.  Baking soda. Pour a small amount into the bath as directed by your health care provider.  Colloidal oatmeal. Follow the instructions on the packaging. You  can get this at your local pharmacy or grocery store.  Try applying baking soda paste to your skin. Stir water into baking soda until it reaches a paste-like consistency.  Do not scratch your skin.  Bathe less frequently, such as every other day.  Bathe in lukewarm water. Avoid using hot water. Medicines  Take or apply over-the-counter and prescription medicines only as told by your health care provider.   If you were prescribed an antibiotic medicine, take or apply your antibiotic as told by your health care provider. Do not stop using the antibiotic even if your condition starts to improve. General Instructions  Keep all follow-up visits as told by your health care provider. This is important.  Avoid the substance that caused your reaction. If you do not know what caused it, keep a journal to try to track what caused it. Write down:  What you eat.  What cosmetic products you use.  What you drink.  What you wear in the affected area. This includes jewelry.  If you were given a dressing, take care of it as told by your health care provider. This includes when to change and remove it. SEEK MEDICAL CARE IF:   Your condition does not improve with treatment.  Your condition gets worse.  You have signs of infection such as swelling, tenderness, redness, soreness, or warmth in the affected area.  You have a fever.  You have new symptoms. SEEK IMMEDIATE MEDICAL CARE IF:   You have a severe headache, neck pain, or neck stiffness.  You vomit.  You feel very sleepy.  You notice red streaks coming from the affected area.  Your bone or joint underneath the affected area becomes painful after the skin has healed.  The affected area turns darker.  You have difficulty breathing.   This information is not intended to replace advice given to you by your health care provider. Make sure you discuss any questions you have with your health care provider.   Document Released:  01/30/2000 Document Revised: 10/23/2014 Document Reviewed: 06/19/2014 Elsevier Interactive Patient Education [employer]

## 2014-12-15 NOTE — ED Notes (Signed)
Asthma exacerbation has resolved.  RR are even and unlabored.  Lung sounds clear in all fields.  Pt c/o rash to her back.  11 areas w/ a pimple like appearance some are weeping noted to pt's mid and lower back.

## 2015-01-14 ENCOUNTER — Encounter (HOSPITAL_COMMUNITY): Payer: Self-pay | Admitting: *Deleted

## 2015-01-14 ENCOUNTER — Emergency Department (HOSPITAL_COMMUNITY): Payer: Medicaid Other

## 2015-01-14 ENCOUNTER — Emergency Department (HOSPITAL_COMMUNITY)
Admission: EM | Admit: 2015-01-14 | Discharge: 2015-01-14 | Disposition: A | Discharge status: Home / Self Care | Payer: Medicaid Other | Attending: Emergency Medicine | Admitting: Emergency Medicine

## 2015-01-14 DIAGNOSIS — S39012A Strain of muscle, fascia and tendon of lower back, initial encounter: Secondary | ICD-10-CM | POA: Diagnosis not present

## 2015-01-14 DIAGNOSIS — Y9389 Activity, other specified: Secondary | ICD-10-CM | POA: Diagnosis not present

## 2015-01-14 DIAGNOSIS — Y92410 Unspecified street and highway as the place of occurrence of the external cause: Secondary | ICD-10-CM | POA: Diagnosis not present

## 2015-01-14 DIAGNOSIS — J45901 Unspecified asthma with (acute) exacerbation: Secondary | ICD-10-CM | POA: Insufficient documentation

## 2015-01-14 DIAGNOSIS — Y998 Other external cause status: Secondary | ICD-10-CM | POA: Diagnosis not present

## 2015-01-14 DIAGNOSIS — S8992XA Unspecified injury of left lower leg, initial encounter: Secondary | ICD-10-CM | POA: Diagnosis not present

## 2015-01-14 DIAGNOSIS — Z3202 Encounter for pregnancy test, result negative: Secondary | ICD-10-CM | POA: Insufficient documentation

## 2015-01-14 DIAGNOSIS — Z8614 Personal history of Methicillin resistant Staphylococcus aureus infection: Secondary | ICD-10-CM | POA: Diagnosis not present

## 2015-01-14 DIAGNOSIS — Z7951 Long term (current) use of inhaled steroids: Secondary | ICD-10-CM | POA: Insufficient documentation

## 2015-01-14 DIAGNOSIS — F17210 Nicotine dependence, cigarettes, uncomplicated: Secondary | ICD-10-CM | POA: Insufficient documentation

## 2015-01-14 DIAGNOSIS — S3991XA Unspecified injury of abdomen, initial encounter: Secondary | ICD-10-CM | POA: Diagnosis not present

## 2015-01-14 DIAGNOSIS — Z79899 Other long term (current) drug therapy: Secondary | ICD-10-CM | POA: Insufficient documentation

## 2015-01-14 DIAGNOSIS — Z7952 Long term (current) use of systemic steroids: Secondary | ICD-10-CM | POA: Diagnosis not present

## 2015-01-14 DIAGNOSIS — Z8619 Personal history of other infectious and parasitic diseases: Secondary | ICD-10-CM | POA: Diagnosis not present

## 2015-01-14 DIAGNOSIS — S299XXA Unspecified injury of thorax, initial encounter: Secondary | ICD-10-CM | POA: Insufficient documentation

## 2015-01-14 DIAGNOSIS — M791 Myalgia, unspecified site: Secondary | ICD-10-CM

## 2015-01-14 DIAGNOSIS — S3992XA Unspecified injury of lower back, initial encounter: Secondary | ICD-10-CM | POA: Diagnosis present

## 2015-01-14 LAB — PREGNANCY, URINE: PREG TEST UR: NEGATIVE
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
## 2015-01-14 MED ORDER — NAPROXEN 500 MG PO TABS: 500.0000 mg | ORAL_TABLET | Freq: Two times a day (BID) | ORAL | Status: DC

## 2015-01-14 MED ORDER — PREDNISONE 20 MG PO TABS: 40.0000 mg | ORAL_TABLET | Freq: Every day | ORAL | Status: DC

## 2015-01-14 MED ORDER — METHOCARBAMOL 500 MG PO TABS: 500.0000 mg | ORAL_TABLET | Freq: Two times a day (BID) | ORAL | Status: DC
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
## 2015-04-09 ENCOUNTER — Emergency Department (HOSPITAL_COMMUNITY)
Admission: EM | Admit: 2015-04-09 | Discharge: 2015-04-09 | Disposition: A | Discharge status: Home / Self Care | Payer: Medicaid Other | Attending: Emergency Medicine | Admitting: Emergency Medicine

## 2015-04-09 ENCOUNTER — Encounter (HOSPITAL_COMMUNITY): Payer: Self-pay | Admitting: Emergency Medicine

## 2015-04-09 DIAGNOSIS — Z7951 Long term (current) use of inhaled steroids: Secondary | ICD-10-CM | POA: Insufficient documentation

## 2015-04-09 DIAGNOSIS — R21 Rash and other nonspecific skin eruption: Secondary | ICD-10-CM | POA: Diagnosis present

## 2015-04-09 DIAGNOSIS — L0390 Cellulitis, unspecified: Secondary | ICD-10-CM

## 2015-04-09 DIAGNOSIS — IMO0001 Reserved for inherently not codable concepts without codable children: Secondary | ICD-10-CM

## 2015-04-09 DIAGNOSIS — W57XXXA Bitten or stung by nonvenomous insect and other nonvenomous arthropods, initial encounter: Secondary | ICD-10-CM | POA: Insufficient documentation

## 2015-04-09 DIAGNOSIS — J45909 Unspecified asthma, uncomplicated: Secondary | ICD-10-CM | POA: Diagnosis not present

## 2015-04-09 DIAGNOSIS — Z8619 Personal history of other infectious and parasitic diseases: Secondary | ICD-10-CM | POA: Diagnosis not present

## 2015-04-09 DIAGNOSIS — S80861A Insect bite (nonvenomous), right lower leg, initial encounter: Secondary | ICD-10-CM | POA: Diagnosis not present

## 2015-04-09 DIAGNOSIS — Z8614 Personal history of Methicillin resistant Staphylococcus aureus infection: Secondary | ICD-10-CM | POA: Diagnosis not present

## 2015-04-09 DIAGNOSIS — S50862A Insect bite (nonvenomous) of left forearm, initial encounter: Secondary | ICD-10-CM | POA: Diagnosis not present

## 2015-04-09 DIAGNOSIS — Y998 Other external cause status: Secondary | ICD-10-CM | POA: Diagnosis not present

## 2015-04-09 DIAGNOSIS — I10 Essential (primary) hypertension: Secondary | ICD-10-CM | POA: Insufficient documentation

## 2015-04-09 DIAGNOSIS — Y9389 Activity, other specified: Secondary | ICD-10-CM | POA: Insufficient documentation

## 2015-04-09 DIAGNOSIS — Z79899 Other long term (current) drug therapy: Secondary | ICD-10-CM | POA: Diagnosis not present

## 2015-04-09 DIAGNOSIS — L03114 Cellulitis of left upper limb: Secondary | ICD-10-CM | POA: Diagnosis not present

## 2015-04-09 DIAGNOSIS — R030 Elevated blood-pressure reading, without diagnosis of hypertension: Secondary | ICD-10-CM

## 2015-04-09 DIAGNOSIS — F17210 Nicotine dependence, cigarettes, uncomplicated: Secondary | ICD-10-CM | POA: Insufficient documentation

## 2015-04-09 DIAGNOSIS — Y9289 Other specified places as the place of occurrence of the external cause: Secondary | ICD-10-CM | POA: Diagnosis not present

## 2015-04-09 MED ORDER — DIPHENHYDRAMINE HCL 25 MG PO TABS: 50.0000 mg | ORAL_TABLET | ORAL | Status: DC | PRN

## 2015-04-09 MED ORDER — ACETAMINOPHEN 325 MG PO TABS: 975.0000 mg | ORAL_TABLET | Freq: Once | ORAL | Status: DC

## 2015-04-09 MED ORDER — DEXAMETHASONE SODIUM PHOSPHATE 10 MG/ML IJ SOLN
10.0000 mg | Freq: Once | INTRAMUSCULAR | Status: AC
  Administered 2015-04-09: 10 mg via INTRAMUSCULAR
  Filled 2015-04-09: qty 1

## 2015-04-09 MED ORDER — CEPHALEXIN 500 MG PO CAPS: 500.0000 mg | ORAL_CAPSULE | Freq: Four times a day (QID) | ORAL | Status: DC

## 2015-04-09 MED ORDER — CEPHALEXIN 500 MG PO CAPS
500.0000 mg | ORAL_CAPSULE | Freq: Once | ORAL | Status: AC
  Administered 2015-04-09: 500 mg via ORAL
  Filled 2015-04-09: qty 1

## 2015-04-09 MED ORDER — DIPHENHYDRAMINE HCL 25 MG PO CAPS
25.0000 mg | ORAL_CAPSULE | Freq: Once | ORAL | Status: AC
  Administered 2015-04-09: 25 mg via ORAL
  Filled 2015-04-09: qty 1

## 2015-04-09 NOTE — ED Notes (Signed)
Pt states that she started having bumps about her body yesterday that have swollen more tonight. L arm is worst area that is swollen. Alert and oriented.

## 2015-04-09 NOTE — ED Notes (Signed)
Pt refused tylenol at discharge.  Refused vitals. Signature obtained.

## 2015-04-09 NOTE — Discharge Instructions (Signed)
Please do not scratch at the lesions.  If you see worsening signs of infection (warmth, redness, tenderness, pus, sharp increase in pain, fever, red streaking) immediately return to the emergency department.  Take your antibiotics as directed and to completion. You should never have any leftover antibiotics! Push fluids and stay well hydrated.   Any antibiotic use can reduce the efficacy of hormonal birth control. Please use back up method of contraception.   Please follow with your primary care doctor in the next 5 days for high blood pressure evaluation. If you do not have a primary care doctor, present to urgent care. Reduce salt intake. Seek emergency medical care for unilateral weakness, slurring, change in vision, or chest pain and shortness of breath.  Do not hesitate to return to the emergency room for any new, worsening or concerning symptoms.  Please obtain primary care using resource guide below. Let them know that you were seen in the emergency room and that they will need to obtain records for further outpatient management.   Cellulitis Cellulitis is an infection of the skin and the tissue beneath it. The infected area is usually red and tender. Cellulitis occurs most often in the arms and lower legs.  CAUSES  Cellulitis is caused by bacteria that enter the skin through cracks or cuts in the skin. The most common types of bacteria that cause cellulitis are staphylococci and streptococci. SIGNS AND SYMPTOMS   Redness and warmth.  Swelling.  Tenderness or pain.  Fever. DIAGNOSIS  Your health care provider can usually determine what is wrong based on a physical exam. Blood tests may also be done. TREATMENT  Treatment usually involves taking an antibiotic medicine. HOME CARE INSTRUCTIONS   Take your antibiotic medicine as directed by your health care provider. Finish the antibiotic even if you start to feel better.  Keep the infected arm or leg elevated to reduce  swelling.  Apply a warm cloth to the affected area up to 4 times per day to relieve pain.  Take medicines only as directed by your health care provider.  Keep all follow-up visits as directed by your health care provider. SEEK MEDICAL CARE IF:   You notice red streaks coming from the infected area.  Your red area gets larger or turns dark in color.  Your bone or joint underneath the infected area becomes painful after the skin has healed.  Your infection returns in the same area or another area.  You notice a swollen bump in the infected area.  You develop new symptoms.  You have a fever. SEEK IMMEDIATE MEDICAL CARE IF:   You feel very sleepy.  You develop vomiting or diarrhea.  You have a general ill feeling (malaise) with muscle aches and pains.   This information is not intended to replace advice given to you by your health care provider. Make sure you discuss any questions you have with your health care provider.   Document Released: 11/11/2004 Document Revised: 10/23/2014 Document Reviewed: 04/19/2011 Elsevier Interactive Patient Education 2016 [employer].   Emergency Department Resource Guide 1) Find a Doctor and Pay Out of Pocket Although you won't have to find out who is covered by your insurance plan, it is a good idea to ask around and get recommendations. You will then need to call the office and see if the doctor you have chosen will accept you as a new patient and what types of options they offer for patients who are self-pay. Some doctors offer discounts or  will set up payment plans for their patients who do not have insurance, but you will need to ask so you aren't surprised when you get to your appointment.  2) Contact Your Local Health Department Not all health departments have doctors that can see patients for sick visits, but many do, so it is worth a call to see if yours does. If you don't know where your local health department is, you can check in  your phone book. The CDC also has a tool to help you locate your state's health department, and many state websites also have listings of all of their local health departments.  3) Find a Walk-in Clinic If your illness is not likely to be very severe or complicated, you may want to try a walk in clinic. These are popping up all over the country in pharmacies, drugstores, and shopping centers. They're usually staffed by nurse practitioners or physician assistants that have been trained to treat common illnesses and complaints. They're usually fairly quick and inexpensive. However, if you have serious medical issues or chronic medical problems, these are probably not your best option.  No Primary Care Doctor: - Call Health Connect at  [phone] - they can help you locate a primary care doctor that  accepts your insurance, provides certain services, etc. - Physician Referral Service- [phone]  Chronic Pain Problems: Organization         Address  Phone   Notes  [facility]  [phone] Patients need to be referred by their primary care doctor.   Medication Assistance: Organization         Address  Phone   Notes  [facility] Medication [facility] [address] [place] [place], [place] [address] [phone] --Must be a resident of [facility] -- Must have NO insurance coverage whatsoever (no Medicaid/ Medicare, etc.) -- The pt. MUST have a primary care doctor that directs their care regularly and follows them in the community   MedAssist  [phone]   [employer]  [phone]    Agencies that provide inexpensive medical care: Organization         Address  Phone   Notes  [facility]  [phone]   [facility]    [phone]   [facility] [address] [place], [place] [address] [phone]   [facility] [address] [place], [place] [phone]    Planned Parenthood    [phone]   [facility]    [phone]   [facility]  [address], [place] Phone:  [phone], Fax:  [phone] Hours of Operation:  9 am - 6 pm, M-F.  Also accepts Medicaid/Medicare and self-pay.  [facility]  [address], [place] Phone: [phone], Fax: [phone]. Hours of Operation:  8:30 am - 5:30 pm, M-F.  Also accepts Medicaid and self-pay.  [facility] [place] [address] [place] Phone: [phone]   [facility] [address] Natasha Bence [place], [place] [phone], Ext. [phone] Mondays & Thursdays: 7-9 AM.  First 15 patients are seen on a first come, first serve basis.    Medicaid-accepting [facility] Providers:  Organization         Address  Phone   Notes  [facility] [address],  [phone])  [phone] Also accepts self-pay patients.  [facility] [address], [place]  [phone]) [phone]   [facility] [address], [place] [phone]   [facility] [address] [place] [address], [place] [phone]) [phone]   Renaye RakersVeita Bland [address], [place]   [phone] Only accepts [place] Access [place] patients after they have their name applied to their card.   Self-Pay (no insurance) in [place]:  Organization         Address  Phone   Notes  Sickle Cell Patients, [facility] [address], [place] [phone]) [phone]   [facility] [address], [place] [phone]) [phone]   [facility]  [address], [place] [phone]   [facility]/Dr. Osei-Bonsu  [address] [place] [address], [place] or [phone], [place] [phone] Phone number for both [place] and [place] locations is the  same.  [facility] [address], [place] [phone]   [facility] [place] [address] [place] [address], [place] or [address] [phone]) [phone]   [facility] [address], [place] [phone]) [phone], phone; [phone](336) [phone], fax Sees patients 1st and 3rd Saturday of every month.  Must not qualify for public or private insurance (i.e. Medicaid, Medicare, [place] Health Choice, Veterans' Benefits)  Household income should be no more than 200% of the poverty level The clinic cannot treat you if you are pregnant or think you are pregnant  Sexually transmitted diseases are not treated at the clinic.    Dental Care: Organization         Address  Phone  Notes  [facility] [address], [place] [phone] Accepts children up to age 25 who are enrolled in [place] or [place] Health Choice; pregnant women with a Medicaid card; and children who have applied for Medicaid or [place] Health Choice, but were declined, whose parents can pay a reduced fee at time of service.  [facility] [place]  [address], [place] [phone]) [phone] Accepts children up to age 25 who are enrolled in [place] or  Health Choice; pregnant women with a Medicaid card; and children who have applied for Medicaid or  Health Choice, but were declined, whose parents can pay a reduced fee at time of service.  [facility]  [address], [place] [phone] Patients are seen by appointment only. Walk-ins are not accepted. [facility] will see patients 25 years of age and older. Monday - Tuesday (8am-5pm) Most Wednesdays (8:30-5pm) $30 per visit, cash only  [facility]  [address], [facility] [phone] Patients are seen by appointment only. Walk-ins are not accepted. [facility] will see patients  25 years of age and older. One Wednesday Evening (Monthly: Volunteer Based).  $30 per visit, cash only  [employer]  [phone] for adults; Children under age 584, call Graduate Pediatric Dentistry at [phone]) [phone]. Children aged 594-14, please call [phone] to request a pediatric application.  Dental services are provided in all areas of dental care including fillings, crowns and bridges, complete and partial dentures, implants, gum treatment, root canals, and extractions. Preventive care is also provided.  Treatment is provided to both adults and children. Patients are selected via a lottery and there is often a waiting list.   [facility] [address], [place]  [phone] [email or web address]   [facility] [address] [place], [place] [phone], Ext. [phone] Second and Fourth Thursday of each month, opens at 6:30 AM; Clinic ends at 9 AM.  Patients are seen on a first-come first-served basis, and a limited number are seen during each clinic.   [facility]  [address] Hillard Danker [place], [place] [phone]   Eligibility Requirements You must have lived in [place], [place], or [place] counties for at least the last three months.   You cannot be eligible for state or federal sponsored [employer], including [employer], [place], or [employer].   You generally cannot be eligible for healthcare insurance through your employer.    How to apply: Eligibility screenings are held every Tuesday and Wednesday afternoon from 1:00 pm until 4:00 pm. You do not need an appointment for the interview!  [facility] [address] [place], [place]   [place]  [place] Department  [place]  [phone]    Behavioral Health Resources in the Community: Intensive Outpatient  Programs Organization         Address  Phone  Notes  [place]. [address] [place], [place] [phone]   [facility] Outpatient [address] [place], [place]   ADS: Alcohol & Drug S[facility]cs [address], [place], [place]   [place] [address]  [place], [place] or [phone]   Substance Abuse Resources Organization         Address  Phone  Notes  Alcohol and Drug Services  [phone]   [place]  [phone]   [facility]   Chinita Pester  [phone]   Residential & Outpatient Substance Abuse Program  [phone]   Psychological Services Organization         Address  Phone  Notes  [facility] [place]  [place]  [phone]   [place] [address], [place] or [phone]    Mobile Crisis Teams Organization         Address  Phone  Notes  [facility], Mobile Crisis Care Unit  [phone]   [facility]  [address] [place], [place]   [place] [address] [place] [phone]    Self-Help/Support Groups Organization         Address  Phone             Notes  [place]. of [place] - variety of support groups  [place] Call for more information  Narcotics Anonymous (NA), [facility] [address], [employer] [place]  2 meetings at this location   Special educational needs teacher         Address  Phone  Notes  [facility] [place],    [place]  [phone]   [facility]  [address] [place] 824235, [place], [place]   [place], Eugenio Saenz [phone] Admissions: 8am-3pm M-F  Incentives Substance [place] [address]    [place],  [place]  [phone]   [facility] [address] Starling Manns [place], [place] [phone]   [facility] [address],  [place], [place] [phone]   [facility] - Intensive Outpatient [address], [place], [place] [phone]   [facility] ([facility]iction [facility] [address] [place].,  [place], [place] [phone] or [phone]   [facility] (RTS) [address] [place], [place] [phone] Accepts Medicaid  [facility] [place] [address]  [place] [phone] Substance Abuse/Addiction Treatment   [facility] Organization         Address  Phone  Notes  [facility]  [phone]   Angie Fava, PhD [address] Ervin Knack [place], [place]   [phone] or [phone]   [facility] Behavioral   [address] [place], [place] [phone]   [facility] [address], [place], [place] [phone] Insurance/Medicaid/sponsorship through [facility] and Families [address]                                    [place], [place] [phone] Therapy/tele-psych/case  [facility] [address], [place] [phone]    Dr. Lolly Mustache  [phone]   [facility] [place]  [employer] [facility] Dept. 1) [address] [place] 2) [address] [place] 3)  [address], [place] [phone]  [phone]   [facility] Child Abuse Hotline [phone] or [phone] (After Hours)

## 2015-04-09 NOTE — ED Notes (Signed)
Pt requesting pain meds. Will not d/c until speaks with PA

## 2015-04-09 NOTE — ED Provider Notes (Signed)
CSN: [id number]     Arrival date & time 04/09/15  0045 History   First MD Initiated Contact with Patient 04/09/15 0802     Chief Complaint  Patient presents with  . Skin Problem     (Consider location/radiation/quality/duration/timing/severity/associated sxs/prior Treatment) HPI   Blood pressure 142/107, pulse 77, temperature 98 F (36.7 C), temperature source Oral, resp. rate 18, SpO2 100 %, not currently breastfeeding.  Brittany [name] is a 25 y.o. female complaining of asthma and hypertension presenting with initially pruritic now painful rash to her left arm and right leg. First  lesion appeared Monday evening on the forearm and patient originally thought it was a mosquito bite. She states the lesions are constantly painful with increased pain when touched but she denies itching. Pt states left arm began to swell yesterday and is warm to touch. States it feels like her hand has "fallen asleep". Denies a change in detergent or skin care products, fever, chills, lip or tongue swelling, shortness of breath.  Past Medical History  Diagnosis Date  . Asthma   . NVD (normal vaginal delivery) 08/30/2010  . Hx MRSA infection 2005  . Trichimoniasis   . Chlamydia   . Gonorrhea    Past Surgical History  Procedure Laterality Date  . No past surgeries    . Multiple tooth extractions     Family History  Problem Relation Age of Onset  . Anesthesia problems Neg Hx   . Other Neg Hx   . Diabetes Maternal Grandmother   . Hypertension Father   . Heart disease Father   . Lung disease Father    Social History  Substance Use Topics  . Smoking status: Current Some Day Smoker -- 0.25 packs/day for 5 years    Types: Cigarettes    Last Attempt to Quit: 12/27/2012  . Smokeless tobacco: Never Used  . Alcohol Use: No   OB History    Gravida Para Term Preterm AB TAB SAB Ectopic Multiple Living   Review of Systems  10 systems reviewed and found to be negative, except  as noted in the HPI.  Allergies  Iodine and Sulfonamide derivatives  Home Medications   Prior to Admission medications   Medication Sig Start Date End Date Taking? Authorizing Provider  albuterol (PROVENTIL HFA;VENTOLIN HFA) 108 (90 BASE) MCG/ACT inhaler Inhale 2 puffs into the lungs every 4 (four) hours as needed for wheezing or shortness of breath. 03/21/14  Yes Tilden Fossa, MD  albuterol (PROVENTIL) (2.5 MG/3ML) 0.083% nebulizer solution Inhale 3 mLs into the lungs every 6 (six) hours as needed. For wheezing 10/23/14  Yes Historical Provider, MD  beclomethasone (QVAR) 40 MCG/ACT inhaler Inhale 1 puff into the lungs 2 (two) times daily. 12/15/14  Yes Leta Baptist, MD  medroxyPROGESTERone (DEPO-PROVERA) 150 MG/ML injection Inject 1 mL (150 mg total) into the muscle every 3 (three) months. 09/06/13  Yes Carrington Clamp, MD  cephALEXin (KEFLEX) 500 MG capsule Take 1 capsule (500 mg total) by mouth 4 (four) times daily. 04/09/15   Boruch Manuele, PA-C  diphenhydrAMINE (BENADRYL) 25 MG tablet Take 2 tablets (50 mg total) by mouth every 4 (four) hours as needed for itching. 04/09/15   Joni Reining Latangela Mccomas, PA-C  methocarbamol (ROBAXIN) 500 MG tablet Take 1 tablet (500 mg total) by mouth 2 (two) times daily. Patient not taking: Reported on 04/09/2015 01/14/15   Jaynie Crumble, PA-C  naproxen (NAPROSYN) 500 MG  tablet Take 1 tablet (500 mg total) by mouth 2 (two) times daily. Patient not taking: Reported on 04/09/2015 01/14/15   Jaynie Crumble, PA-C  predniSONE (DELTASONE) 20 MG tablet Take 2 tablets (40 mg total) by mouth daily. Patient not taking: Reported on 04/09/2015 01/14/15   Jaynie Crumble, PA-C  triamcinolone cream (KENALOG) 0.1 % Apply 1 application topically 2 (two) times daily. Patient not taking: Reported on 04/09/2015 12/15/14   Leta Baptist, MD   BP 142/107 mmHg  Pulse 77  Temp(Src) 98 F (36.7 C) (Oral)  Resp 18  SpO2 100% Physical Exam  Constitutional: She is  oriented to person, place, and time. She appears well-developed and well-nourished. No distress.  HENT:  Head: Normocephalic.  Mouth/Throat: Oropharynx is clear and moist.  Eyes: Conjunctivae and EOM are normal. Pupils are equal, round, and reactive to light.  Neck: Normal range of motion.  Cardiovascular: Normal rate, regular rhythm and intact distal pulses.   Pulmonary/Chest: Effort normal and breath sounds normal. No stridor. No respiratory distress. She has no wheezes. She has no rales. She exhibits no tenderness.  Abdominal: Soft. Bowel sounds are normal. She exhibits no distension and no mass. There is no tenderness. There is no rebound and no guarding.  Musculoskeletal: Normal range of motion.  Neurological: She is alert and oriented to person, place, and time.  Skin: Rash noted.  Scattered 5-8 mm blisters to left forearm and right lower extremity, the lesions spare the palms soles and mucous membranes, on the left forearm there is an area of surrounding cellulitis measuring approximately 4 cm.  Psychiatric: She has a normal mood and affect.  Nursing note and vitals reviewed.   ED Course  Procedures (including critical care time) Labs Review Labs Reviewed - No data to display  Imaging Review No results found. I have personally reviewed and evaluated these images and lab results as part of my medical decision-making.   EKG Interpretation None      MDM   Final diagnoses:  Insect bite  Cellulitis, unspecified cellulitis site, unspecified extremity site, unspecified laterality  Elevated blood pressure   . Filed Vitals:   04/09/15 0058 04/09/15 0732  BP: 134/88 142/107  Pulse: 81 77  Temp: 98 F (36.7 C) 98 F (36.7 C)  TempSrc: Oral   Resp: 18 18  SpO2: 99% 100%    Medications  acetaminophen (TYLENOL) tablet 975 mg (975 mg Oral Not Given 04/09/15 0859)  dexamethasone (DECADRON) injection 10 mg (10 mg Intramuscular Given 04/09/15 0848)  diphenhydrAMINE (BENADRYL)  capsule 25 mg (25 mg Oral Given 04/09/15 0848)  cephALEXin (KEFLEX) capsule 500 mg (500 mg Oral Given 04/09/15 0848)    Brittany [name] is 25 y.o. female presenting with blistering rash to forearm and left leg. Patient has been scratching aggressively at the area, thinks she has an overlying cellulitis due to manipulation. No signs of systemic infection. Patient will be started on Decadron, Benadryl and Keflex for cellulitis.  Evaluation does not show pathology that would require ongoing emergent intervention or inpatient treatment. Pt is hemodynamically stable and mentating appropriately. Discussed findings and plan with patient/guardian, who agrees with care plan. All questions answered. Return precautions discussed and outpatient follow up given.   Discharge Medication List as of 04/09/2015  8:27 AM    START taking these medications   Details  cephALEXin (KEFLEX) 500 MG capsule Take 1 capsule (500 mg total) by mouth 4 (four) times daily., Starting 04/09/2015, Until Discontinued, Print    diphenhydrAMINE (  BENADRYL) 25 MG tablet Take 2 tablets (50 mg total) by mouth every 4 (four) hours as needed for itching., Starting 04/09/2015, Until Discontinued, Print             Wynetta Emery, PA-C 04/09/15 1053  Lyndal Pulley, MD 04/09/15 [phone]

## 2015-04-09 NOTE — ED Notes (Signed)
Went in to speak to pt regarding her request for pain med.  She's made aware that the EDPA has ordered tylenol for pain.  She states "I am in pain.  I have been here for 10 hours and that's all I got?  I can barely feel my fucking arm.  I have tylenol at home. "  Pt made aware that that's what the EDPA ordered.  Pt refused and states she's going home.

## 2015-06-27 IMAGING — US US ABDOMEN LIMITED
1 series · 14 of 24 positions shown · non-contrast
Comparison: [DATE] and CT of [DATE].

CLINICAL DATA: Right upper quadrant pain for 3 days.

EXAM:
US ABDOMEN LIMITED - RIGHT UPPER QUADRANT

[Series 1: us abdomen limited · 0.19mm/px · 14 of 24 slices shown]
[im 1/24]
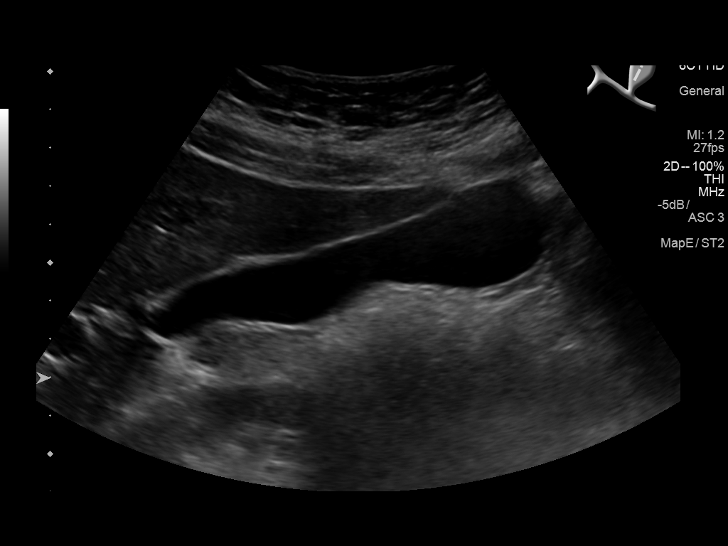
[im 3/24]
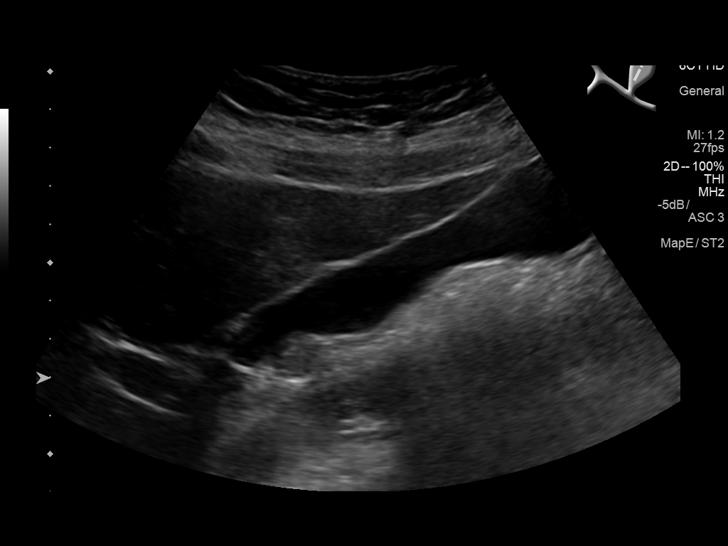
[im 5/24]
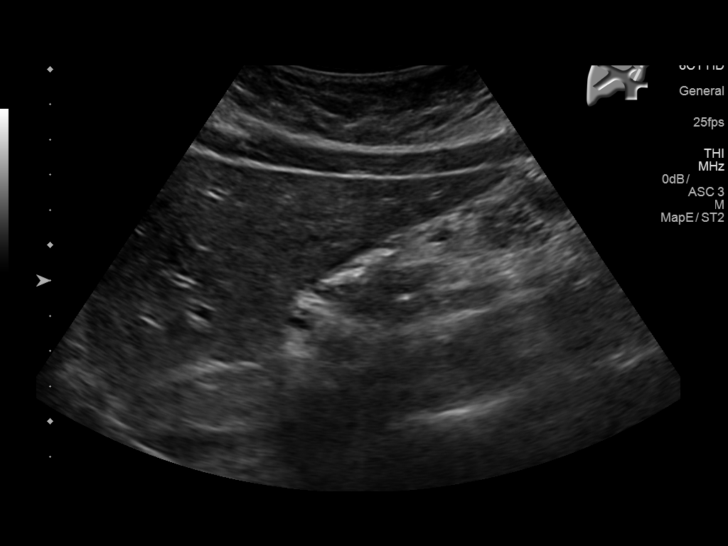
[im 7/24]
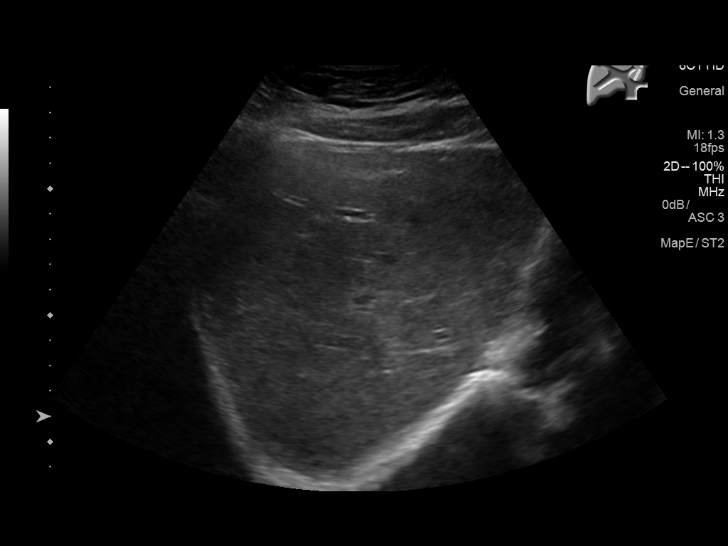
[im 8/24]
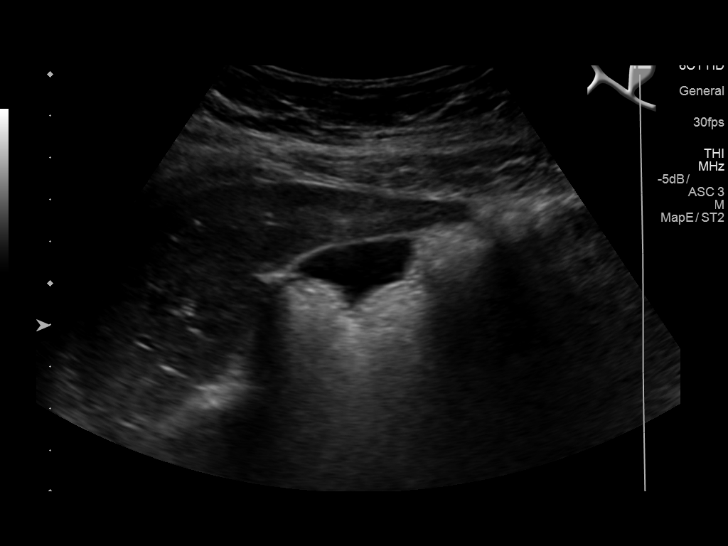
[im 10/24]
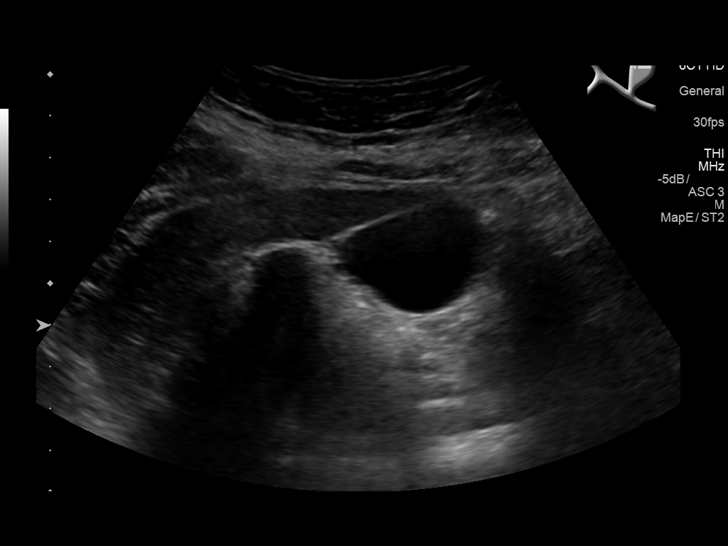
[im 12/24]
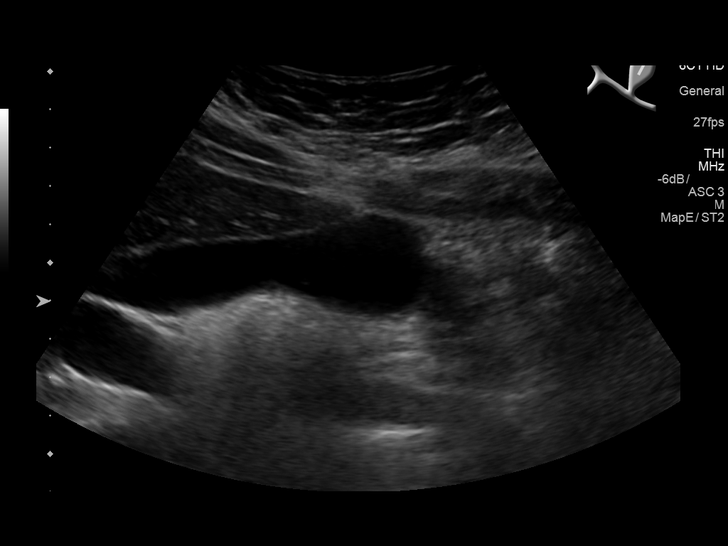
[im 13/24]
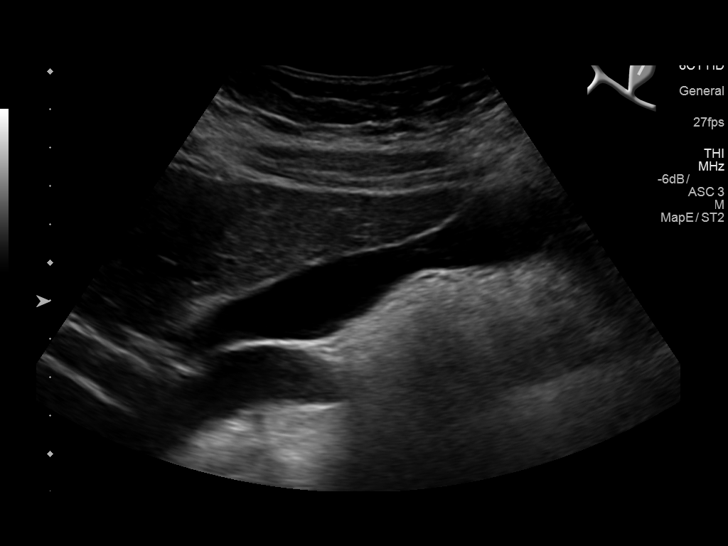
[im 15/24]
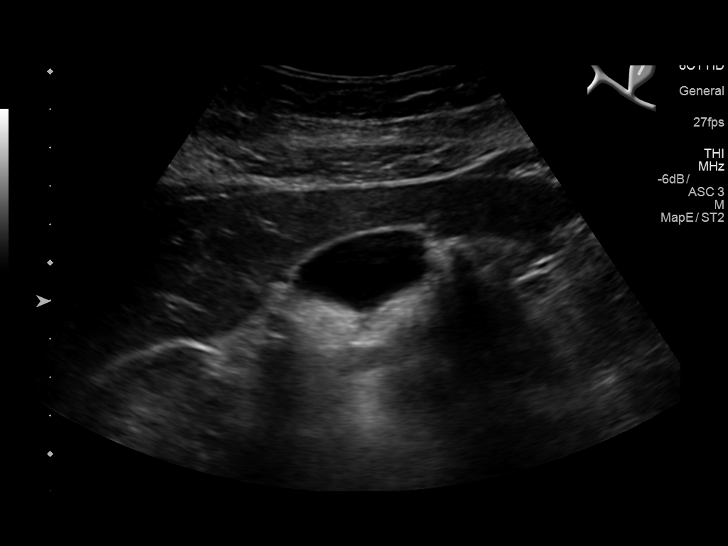
[im 17/24]
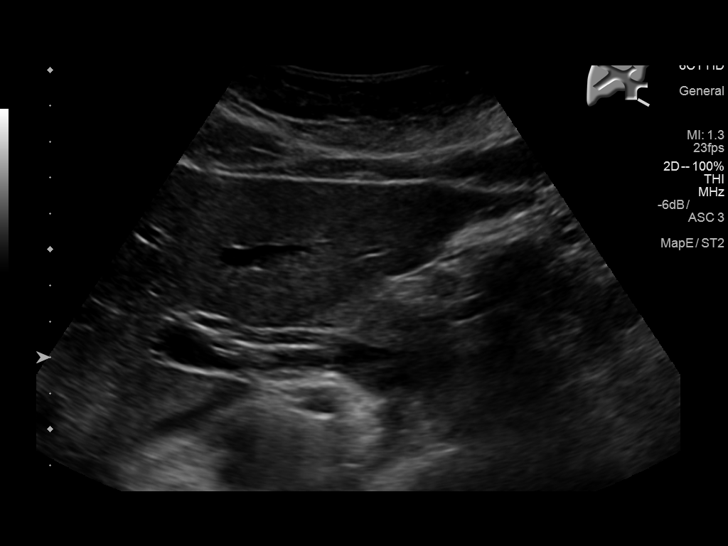
[im 19/24]
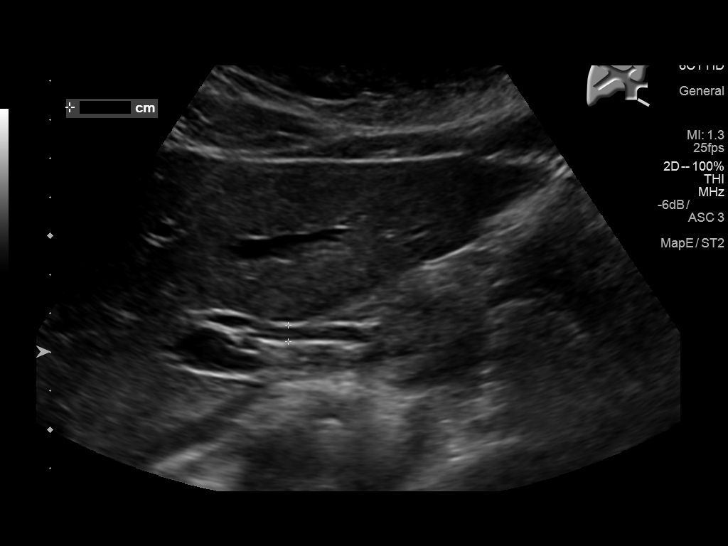
[im 20/24]
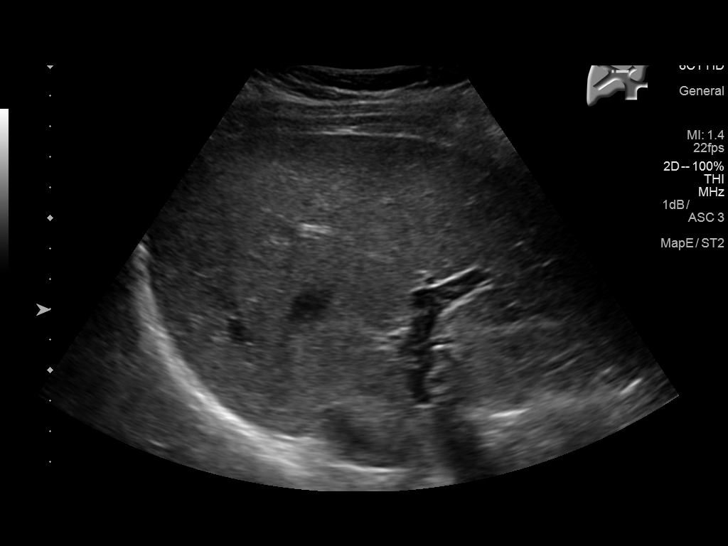
[im 22/24]
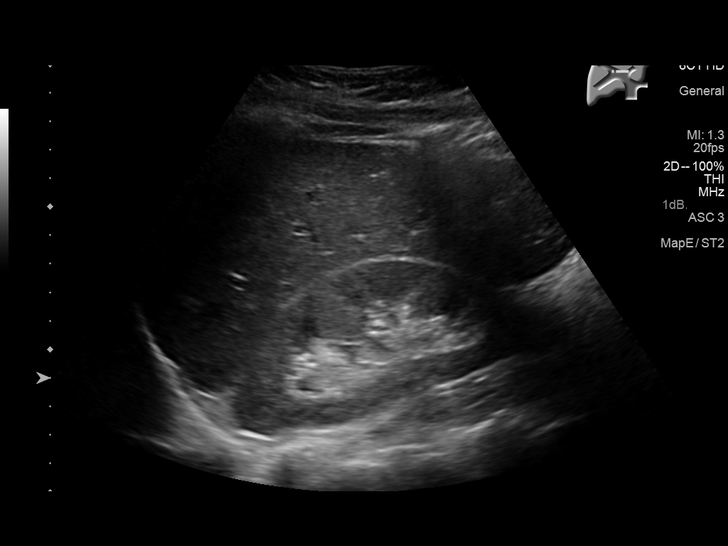
[im 24/24]
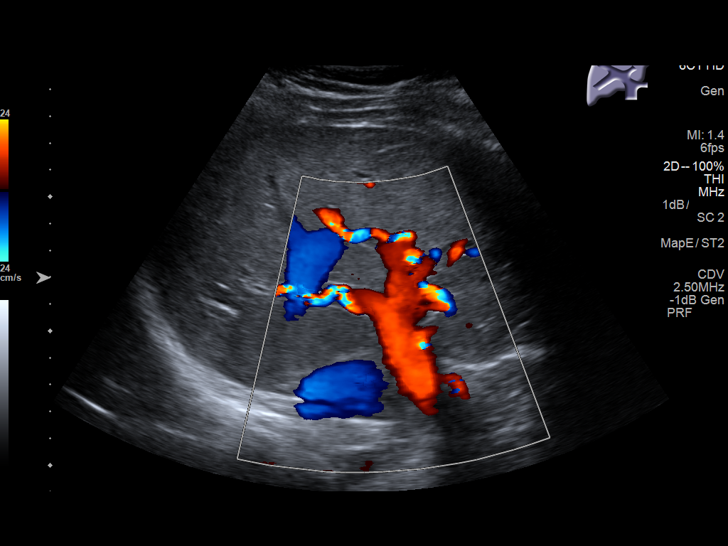

[14 of 24 positions shown; findings below may reference images not displayed]

FINDINGS: Gallbladder:

No gallstones or wall thickening visualized. No sonographic Murphy
sign noted by sonographer.

Common bile duct:

Diameter: Normal, 4 mm.

Liver:

No focal lesion identified. Within normal limits in parenchymal
echogenicity.
IMPRESSION: Normal right upper quadrant ultrasound.

## 2015-07-23 ENCOUNTER — Encounter (HOSPITAL_COMMUNITY): Payer: Self-pay | Admitting: Oncology

## 2015-07-23 ENCOUNTER — Emergency Department (HOSPITAL_COMMUNITY)
Admission: EM | Admit: 2015-07-23 | Discharge: 2015-07-24 | Disposition: A | Discharge status: Home / Self Care | Payer: No Typology Code available for payment source | Attending: Emergency Medicine | Admitting: Emergency Medicine

## 2015-07-23 DIAGNOSIS — Y939 Activity, unspecified: Secondary | ICD-10-CM | POA: Insufficient documentation

## 2015-07-23 DIAGNOSIS — N898 Other specified noninflammatory disorders of vagina: Secondary | ICD-10-CM

## 2015-07-23 DIAGNOSIS — S90852A Superficial foreign body, left foot, initial encounter: Secondary | ICD-10-CM | POA: Insufficient documentation

## 2015-07-23 DIAGNOSIS — Z7952 Long term (current) use of systemic steroids: Secondary | ICD-10-CM | POA: Insufficient documentation

## 2015-07-23 DIAGNOSIS — Z792 Long term (current) use of antibiotics: Secondary | ICD-10-CM | POA: Insufficient documentation

## 2015-07-23 DIAGNOSIS — Y999 Unspecified external cause status: Secondary | ICD-10-CM | POA: Insufficient documentation

## 2015-07-23 DIAGNOSIS — J45909 Unspecified asthma, uncomplicated: Secondary | ICD-10-CM | POA: Insufficient documentation

## 2015-07-23 DIAGNOSIS — Y929 Unspecified place or not applicable: Secondary | ICD-10-CM | POA: Insufficient documentation

## 2015-07-23 DIAGNOSIS — T148XXA Other injury of unspecified body region, initial encounter: Secondary | ICD-10-CM

## 2015-07-23 DIAGNOSIS — W458XXA Other foreign body or object entering through skin, initial encounter: Secondary | ICD-10-CM | POA: Insufficient documentation

## 2015-07-23 DIAGNOSIS — Z791 Long term (current) use of non-steroidal anti-inflammatories (NSAID): Secondary | ICD-10-CM | POA: Insufficient documentation

## 2015-07-23 DIAGNOSIS — F17210 Nicotine dependence, cigarettes, uncomplicated: Secondary | ICD-10-CM | POA: Insufficient documentation

## 2015-07-23 DIAGNOSIS — Z79899 Other long term (current) drug therapy: Secondary | ICD-10-CM | POA: Insufficient documentation

## 2015-07-23 LAB — URINE MICROSCOPIC-ADD ON

## 2015-07-23 LAB — URINALYSIS, ROUTINE W REFLEX MICROSCOPIC
Bilirubin Urine: NEGATIVE
GLUCOSE, UA: NEGATIVE mg/dL
KETONES UR: NEGATIVE mg/dL
Leukocytes, UA: NEGATIVE
Nitrite: NEGATIVE
PH: 6.5 (ref 5.0–8.0)
PROTEIN: NEGATIVE mg/dL
Specific Gravity, Urine: 1.025 (ref 1.005–1.030)

## 2015-07-23 LAB — POC URINE PREG, ED: PREG TEST UR: NEGATIVE

## 2015-07-23 MED ORDER — LIDOCAINE HCL 1 % IJ SOLN
INTRAMUSCULAR | Status: AC
  Administered 2015-07-23: 1.2 mL
  Filled 2015-07-23: qty 20

## 2015-07-23 MED ORDER — METRONIDAZOLE 500 MG PO TABS
2000.0000 mg | ORAL_TABLET | Freq: Once | ORAL | Status: AC
  Administered 2015-07-23: 2000 mg via ORAL
  Filled 2015-07-23: qty 4

## 2015-07-23 MED ORDER — AZITHROMYCIN 250 MG PO TABS
1000.0000 mg | ORAL_TABLET | Freq: Once | ORAL | Status: AC
  Administered 2015-07-23: 1000 mg via ORAL
  Filled 2015-07-23: qty 4

## 2015-07-23 MED ORDER — LIDOCAINE-EPINEPHRINE 2 %-1:100000 IJ SOLN
20.0000 mL | Freq: Once | INTRAMUSCULAR | Status: DC
  Filled 2015-07-23: qty 20

## 2015-07-23 MED ORDER — AEROCHAMBER Z-STAT PLUS/MEDIUM MISC
1.0000 | Freq: Once | Status: AC
  Administered 2015-07-23: 1
  Filled 2015-07-23: qty 1

## 2015-07-23 MED ORDER — CEFTRIAXONE SODIUM 250 MG IJ SOLR
250.0000 mg | Freq: Once | INTRAMUSCULAR | Status: AC
  Administered 2015-07-23: 250 mg via INTRAMUSCULAR
  Filled 2015-07-23: qty 250

## 2015-07-23 MED ORDER — CEPHALEXIN 500 MG PO CAPS: 500.0000 mg | ORAL_CAPSULE | Freq: Four times a day (QID) | ORAL | Status: DC

## 2015-07-23 MED ORDER — LIDOCAINE-EPINEPHRINE (PF) 1 %-1:200000 IJ SOLN
30.0000 mL | Freq: Once | INTRAMUSCULAR | Status: AC
  Administered 2015-07-23: 30 mL
  Filled 2015-07-23: qty 30

## 2015-07-23 MED ORDER — ALBUTEROL SULFATE HFA 108 (90 BASE) MCG/ACT IN AERS
1.0000 | INHALATION_SPRAY | Freq: Once | RESPIRATORY_TRACT | Status: AC
  Administered 2015-07-23: 1 via RESPIRATORY_TRACT
  Filled 2015-07-23: qty 6.7

## 2015-07-23 NOTE — ED Notes (Signed)
Pt presents d/t what she believes is a splinter in her left foot.  No splinter visualized however lateral side of left foot is red and painful, no warmth noted.  Pt reports purulent drainage from area when she squeezes it.  Pt also was informed by her s/o that they had been dx w/ gonnorhea and would like to be treated for that.  Lastly pt states that she is an asthmatic and out of her inhalers and would like a rx for them.

## 2015-07-23 NOTE — Discharge Instructions (Signed)
Take the antibiotics as prescribed. Complete the full course.   Follow-up with a primary care provider or the health department to have your foot reevaluated within 2 days. Be sure to practice safe sex to prevent STDs.  Return to emergency department if you experience signs of infection to include increased pain, warmth, redness around the wound site on your foot, fever, chills, or any foul discharge from the wound.

## 2015-07-23 NOTE — ED Provider Notes (Signed)
CSN: [id number]     Arrival date & time 07/23/15  1959 History  By signing my name below, I, Tanda RockersMargaux Venter, attest that this documentation has been prepared under the direction and in the presence of Aryanne Gilleland, PA-C.  Electronically Signed: Tanda RockersMargaux Venter, ED Scribe. 07/23/2015. 9:29 PM.   Chief Complaint  Patient presents with  . Multiple c/o    The history is provided by the patient. No language interpreter was used.    HPI Comments: Brittany [name] is a 25 y.o. female with PMHx Asthma, who presents to the Emergency Department complaining of gradual onset, constant, left plantar foot pain x 4 days. Pt states that she believes she may have stepped onto something 4 days ago while walking barefoot and thinks there is a splinter in her foot. Pt states that when she bends her toes pain shoots across the foot to the left lower leg. She also notes left great toe pain and numbness/tingling to the dorsum of the left foot. Pt soaked her foot in epsom salts last night and squeezed her foot, causing yellow drainage to come out. There has been no drainage since. Denies fever, chills, redness, or any other associated symptoms.   Pt separately complains of foul smelling vaginal discharge x 3 days. She also complains of dysuria. Pt's partner was recently treated for STDs and she would like to be treated as well. Pt does not want a pelvic exam at this time. She was diagnosed with gonorrhea, trichomonas, and chlamydia in 2012 and trichomonas in 2015.   Pt is also requesting a refill on her albuterol inhaler. She was diagnosed with asthma years ago and typically has flares depending on the allergy season. Pt usually uses an inhaler a couple of times a week due to waking up feeling short of breath but has been using her family's inhaler recently.   Past Medical History  Diagnosis Date  . Asthma   . NVD (normal vaginal delivery) 08/30/2010  . Hx MRSA infection 2005  . Trichimoniasis   . Chlamydia    . Gonorrhea    Past Surgical History  Procedure Laterality Date  . No past surgeries    . Multiple tooth extractions     Family History  Problem Relation Age of Onset  . Anesthesia problems Neg Hx   . Other Neg Hx   . Diabetes Maternal Grandmother   . Hypertension Father   . Heart disease Father   . Lung disease Father    Social History  Substance Use Topics  . Smoking status: Current Some Day Smoker -- 0.25 packs/day for 5 years    Types: Cigarettes    Last Attempt to Quit: 12/27/2012  . Smokeless tobacco: Never Used  . Alcohol Use: No   OB History    Gravida Para Term Preterm AB TAB SAB Ectopic Multiple Living   4 4 4       4      Review of Systems  Constitutional: Negative for fever.  Genitourinary: Positive for dysuria and vaginal discharge.  Musculoskeletal: Positive for joint swelling and arthralgias.  Skin: Negative for color change.    Allergies  Iodine and Sulfonamide derivatives  Home Medications   Prior to Admission medications   Medication Sig Start Date End Date Taking? Authorizing Provider  albuterol (PROVENTIL HFA;VENTOLIN HFA) 108 (90 BASE) MCG/ACT inhaler Inhale 2 puffs into the lungs every 4 (four) hours as needed for wheezing or shortness of breath. 03/21/14   Tilden FossaElizabeth Rees, MD  albuterol (  PROVENTIL) (2.5 MG/3ML) 0.083% nebulizer solution Inhale 3 mLs into the lungs every 6 (six) hours as needed. For wheezing 10/23/14   Historical Provider, MD  beclomethasone (QVAR) 40 MCG/ACT inhaler Inhale 1 puff into the lungs 2 (two) times daily. 12/15/14   Leta Baptist, MD  cephALEXin (KEFLEX) 500 MG capsule Take 1 capsule (500 mg total) by mouth 4 (four) times daily. 04/09/15   Nicole Pisciotta, PA-C  diphenhydrAMINE (BENADRYL) 25 MG tablet Take 2 tablets (50 mg total) by mouth every 4 (four) hours as needed for itching. 04/09/15   Joni Reining Pisciotta, PA-C  medroxyPROGESTERone (DEPO-PROVERA) 150 MG/ML injection Inject 1 mL (150 mg total) into the muscle every 3  (three) months. 09/06/13   Carrington Clamp, MD  methocarbamol (ROBAXIN) 500 MG tablet Take 1 tablet (500 mg total) by mouth 2 (two) times daily. Patient not taking: Reported on 04/09/2015 01/14/15   Lemont Fillers Layson Bertsch, PA-C  naproxen (NAPROSYN) 500 MG tablet Take 1 tablet (500 mg total) by mouth 2 (two) times daily. Patient not taking: Reported on 04/09/2015 01/14/15   Jaynie Crumble, PA-C  predniSONE (DELTASONE) 20 MG tablet Take 2 tablets (40 mg total) by mouth daily. Patient not taking: Reported on 04/09/2015 01/14/15   Jaynie Crumble, PA-C  triamcinolone cream (KENALOG) 0.1 % Apply 1 application topically 2 (two) times daily. Patient not taking: Reported on 04/09/2015 12/15/14   Leta Baptist, MD   BP 130/98 mmHg  Pulse 78  Temp(Src) 98.5 F (36.9 C) (Oral)  Resp 20  Ht  (1.651 m)  Wt 200 lb (90.719 kg)  BMI 33.28 kg/m2  SpO2 100%  LMP 06/29/2015 (Approximate)   Physical Exam  Constitutional: She is oriented to person, place, and time. She appears well-developed and well-nourished. No distress.  HENT:  Head: Normocephalic and atraumatic.  Eyes: Conjunctivae and EOM are normal.  Neck: Neck supple. No tracheal deviation present.  Cardiovascular: Normal rate.   Pulmonary/Chest: Effort normal. No respiratory distress.  Musculoskeletal: Normal range of motion.  Neurological: She is alert and oriented to person, place, and time.  Skin: Skin is warm and dry.  Pinpoint discoloration to the left lateral midfoot, tender to palpation.  Psychiatric: She has a normal mood and affect. Her behavior is normal.  Nursing note and vitals reviewed.   ED Course  .Foreign Body Removal Date/Time: 07/23/2015 10:22 PM Performed by: Jaynie Crumble Authorized by: Jaynie Crumble Consent: Verbal consent obtained. Risks and benefits: risks, benefits and alternatives were discussed Consent given by: patient Patient understanding: patient states understanding of the procedure  being performed Patient identity confirmed: verbally with patient Body area: skin Anesthesia: local infiltration Local anesthetic: lidocaine 1% with epinephrine Anesthetic total: 2 ml Patient sedated: no Localization method: visualized Removal mechanism: alligator forceps and scalpel Complexity: simple 2 objects recovered. Objects recovered: small splinters Patient tolerance: Patient tolerated the procedure well with no immediate complications Comments: 2 pieces of a small splinter removed, unsure if there is any more retained, however, unable to visualize any more   (including critical care time)  DIAGNOSTIC STUDIES: Oxygen Saturation is 100% on RA, normal by my interpretation.    COORDINATION OF CARE: 9:28 PM-Discussed treatment plan which includes UA with pt at bedside and pt agreed to plan.   Labs Review Labs Reviewed - No data to display  Imaging Review No results found. I have personally reviewed and evaluated these images and lab results as part of my medical decision-making.   EKG Interpretation None  INCISION AND DRAINAGE Performed by: Jaynie Crumble A Consent: Verbal consent obtained. Risks and benefits: risks, benefits and alternatives were discussed Type: abscess  Body area: left foot  Anesthesia: local infiltration  Incision was made with a scalpel.  Local anesthetic: lidocaine 1% w epinephrine  Anesthetic total: 2 ml  Complexity: complex Blunt dissection to break up loculations  Drainage: purulent  Drainage amount: small purulent  Packing material: 1/4 in iodoform gauze  Patient tolerance: Patient tolerated the procedure well with no immediate complications.    MDM   Final diagnoses:  None     Patient emergency room with possible splint to the left foot. I do see a pinpoint discoloration which could potentially be the splinter. We will numb the area and explore. Patient is also having some dysuria, will order urinalysis and  pregnancy test. Patient also reports vaginal discharge, and states partner was tested and diagnosed with gonorrhea. Patient does not want a pelvic exam but just wants medications to be treated. I will treat her with Rocephin 250 mg IM, Zithromax 1 g by mouth, Flagyl 2 g by mouth.  10:23 PM Splinters removed, unable to visualize of any more present, patient did have some purulent drainage upon incision. Will cover with Keflex. Urine analysis and pregnancy test pending. Signed out at shift change.  Jaynie Crumble, PA-C 07/26/15 1652  Lorre Nick, MD 07/28/15 [phone]

## 2015-07-23 NOTE — ED Provider Notes (Signed)
Patient care was transferred to me at change of shift from [facility]-C  Pending UA. UA revealed moderate blood in the urine otherwise unremarkable UA. Patient states she is on her period so likely the blood in her urine is secondary to her menstrual period.   We'll discharge patient with Keflex for possible cellulitis of foot. Instructed Patient to follow-up with her primary care provider or the health department in 2 days to have her foot reevaluated. Discussed strict return precautions to include signs of infection around the wound on her foot. Patient expressed understanding to the discharge instructions.  Jerre SimonJessica L Focht, PA 07/24/15 0101  Rolland PorterMark James, MD 08/07/15 [phone]

## 2015-08-27 ENCOUNTER — Emergency Department (HOSPITAL_COMMUNITY)
Admission: EM | Admit: 2015-08-27 | Discharge: 2015-08-27 | Disposition: A | Discharge status: Home / Self Care | Payer: No Typology Code available for payment source | Attending: Dermatology | Admitting: Dermatology

## 2015-08-27 ENCOUNTER — Encounter (HOSPITAL_COMMUNITY): Payer: Self-pay | Admitting: Emergency Medicine

## 2015-08-27 DIAGNOSIS — F17210 Nicotine dependence, cigarettes, uncomplicated: Secondary | ICD-10-CM | POA: Insufficient documentation

## 2015-08-27 DIAGNOSIS — R109 Unspecified abdominal pain: Secondary | ICD-10-CM | POA: Insufficient documentation

## 2015-08-27 DIAGNOSIS — J45909 Unspecified asthma, uncomplicated: Secondary | ICD-10-CM | POA: Insufficient documentation

## 2015-08-27 LAB — COMPREHENSIVE METABOLIC PANEL
ALT: 15 U/L (ref 14–54)
AST: 19 U/L (ref 15–41)
Albumin: 3.7 g/dL (ref 3.5–5.0)
Alkaline Phosphatase: 63 U/L (ref 38–126)
Anion gap: 6 (ref 5–15)
BUN: 9 mg/dL (ref 6–20)
CHLORIDE: 105 mmol/L (ref 101–111)
CO2: 29 mmol/L (ref 22–32)
Calcium: 8.8 mg/dL — ABNORMAL LOW (ref 8.9–10.3)
Creatinine, Ser: 0.77 mg/dL (ref 0.44–1.00)
Glucose, Bld: 115 mg/dL — ABNORMAL HIGH (ref 65–99)
POTASSIUM: 3.1 mmol/L — AB (ref 3.5–5.1)
SODIUM: 140 mmol/L (ref 135–145)
Total Bilirubin: 0.8 mg/dL (ref 0.3–1.2)
Total Protein: 7.2 g/dL (ref 6.5–8.1)

## 2015-08-27 LAB — URINALYSIS, ROUTINE W REFLEX MICROSCOPIC
Bilirubin Urine: NEGATIVE
GLUCOSE, UA: NEGATIVE mg/dL
Hgb urine dipstick: NEGATIVE
Ketones, ur: NEGATIVE mg/dL
Nitrite: NEGATIVE
PH: 7 (ref 5.0–8.0)
Protein, ur: NEGATIVE mg/dL
SPECIFIC GRAVITY, URINE: 1.024 (ref 1.005–1.030)

## 2015-08-27 LAB — URINE MICROSCOPIC-ADD ON

## 2015-08-27 LAB — CBC
HEMATOCRIT: 37.1 % (ref 36.0–46.0)
Hemoglobin: 12.7 g/dL (ref 12.0–15.0)
MCH: 28.7 pg (ref 26.0–34.0)
MCHC: 34.2 g/dL (ref 30.0–36.0)
MCV: 83.7 fL (ref 78.0–100.0)
Platelets: 241 10*3/uL (ref 150–400)
RBC: 4.43 MIL/uL (ref 3.87–5.11)
RDW: 13.5 % (ref 11.5–15.5)
WBC: 8.3 10*3/uL (ref 4.0–10.5)

## 2015-08-27 LAB — LIPASE, BLOOD: LIPASE: 18 U/L (ref 11–51)

## 2015-08-27 NOTE — ED Notes (Addendum)
[facility] that she is upset about the wait and leaving.  Pt stated that we were "pissing her off."  Annieah attempted Pt recovery w/o success.

## 2015-08-27 NOTE — ED Notes (Signed)
Pt states she has been having rt sided abd pain x 1 week.  Has been vomiting every day since 6/14.  LMP was in March.  Pt states that she took a preg test and it was negative.

## 2015-11-19 ENCOUNTER — Encounter (HOSPITAL_COMMUNITY): Payer: Self-pay

## 2015-11-19 ENCOUNTER — Emergency Department (HOSPITAL_COMMUNITY)
Admission: EM | Admit: 2015-11-19 | Discharge: 2015-11-19 | Disposition: A | Discharge status: Home / Self Care | Payer: Self-pay | Attending: Emergency Medicine | Admitting: Emergency Medicine

## 2015-11-19 ENCOUNTER — Emergency Department (HOSPITAL_COMMUNITY): Payer: Self-pay

## 2015-11-19 DIAGNOSIS — F17210 Nicotine dependence, cigarettes, uncomplicated: Secondary | ICD-10-CM | POA: Insufficient documentation

## 2015-11-19 DIAGNOSIS — J4521 Mild intermittent asthma with (acute) exacerbation: Secondary | ICD-10-CM | POA: Insufficient documentation

## 2015-11-19 LAB — I-STAT BETA HCG BLOOD, ED (MC, WL, AP ONLY): I-stat hCG, quantitative: <5 m[IU]/mL (ref <5)

## 2015-11-19 LAB — CBC WITH DIFFERENTIAL/PLATELET
BASOS ABS: 0 10*3/uL (ref 0.0–0.1)
Basophils Relative: 0 %
EOS PCT: 2 %
Eosinophils Absolute: 0.2 10*3/uL (ref 0.0–0.7)
HEMATOCRIT: 36.6 % (ref 36.0–46.0)
Hemoglobin: 12.7 g/dL (ref 12.0–15.0)
LYMPHS ABS: 0.9 10*3/uL (ref 0.7–4.0)
LYMPHS PCT: 7 %
MCH: 28.5 pg (ref 26.0–34.0)
MCHC: 34.7 g/dL (ref 30.0–36.0)
MCV: 82.1 fL (ref 78.0–100.0)
MONO ABS: 0.3 10*3/uL (ref 0.1–1.0)
MONOS PCT: 2 %
NEUTROS ABS: 11.8 10*3/uL — AB (ref 1.7–7.7)
Neutrophils Relative %: 89 %
Platelets: 209 10*3/uL (ref 150–400)
RBC: 4.46 MIL/uL (ref 3.87–5.11)
RDW: 13.6 % (ref 11.5–15.5)
WBC: 13.2 10*3/uL — ABNORMAL HIGH (ref 4.0–10.5)

## 2015-11-19 LAB — I-STAT CHEM 8, ED
BUN: 10 mg/dL (ref 6–20)
CHLORIDE: 102 mmol/L (ref 101–111)
Calcium, Ion: 1.16 mmol/L (ref 1.15–1.40)
Creatinine, Ser: 0.7 mg/dL (ref 0.44–1.00)
Glucose, Bld: 113 mg/dL — ABNORMAL HIGH (ref 65–99)
HEMATOCRIT: 42 % (ref 36.0–46.0)
Hemoglobin: 14.3 g/dL (ref 12.0–15.0)
Potassium: 3.4 mmol/L — ABNORMAL LOW (ref 3.5–5.1)
SODIUM: 142 mmol/L (ref 135–145)
TCO2: 25 mmol/L (ref 0–100)

## 2015-11-19 IMAGING — CR DG CHEST 2V
2 series · 2 of 2 positions shown · non-contrast
Comparison: [DATE]

CLINICAL DATA: Asthma, shortness of breath, and nausea.

EXAM:
CHEST  2 VIEW

[w chest pa]
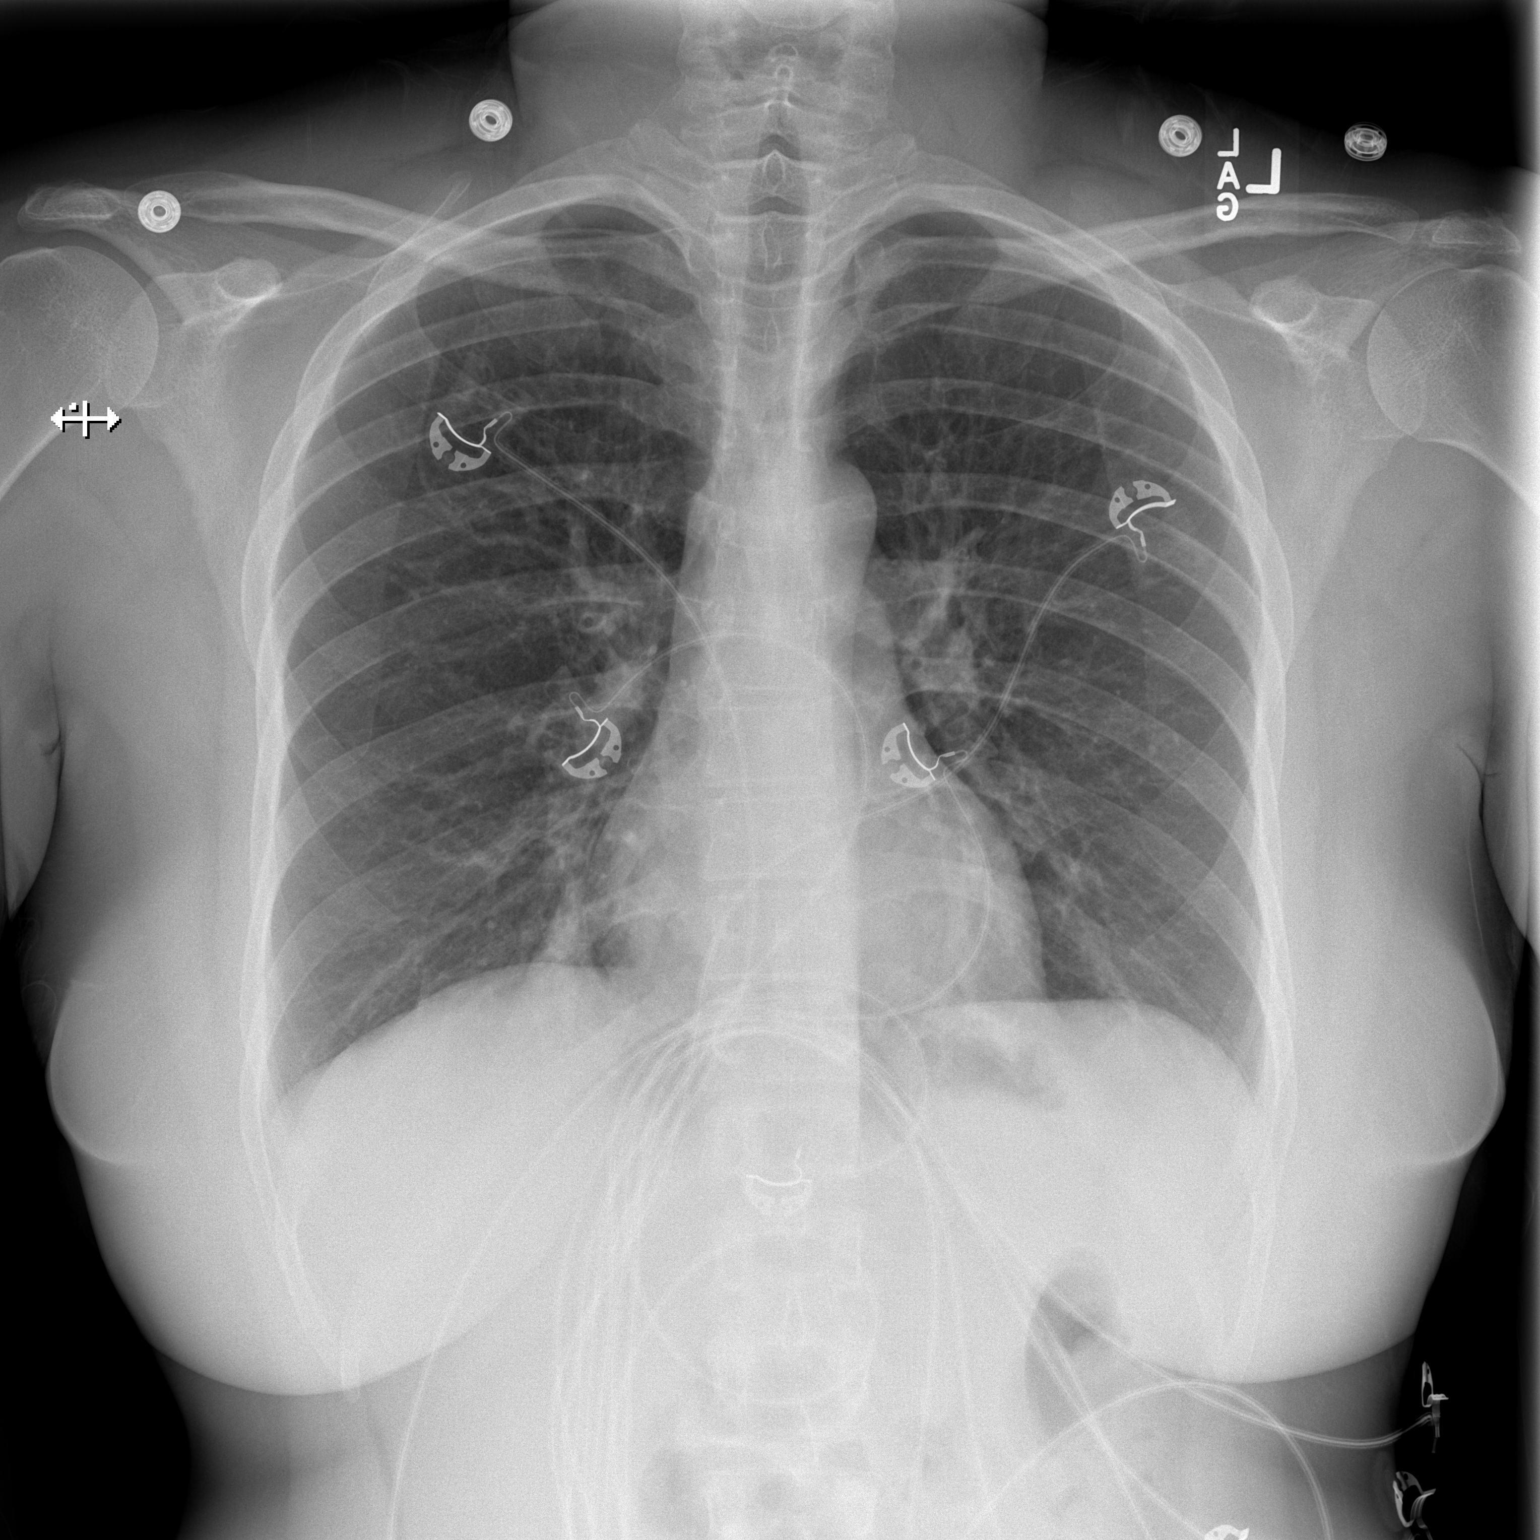

[w chest lat]
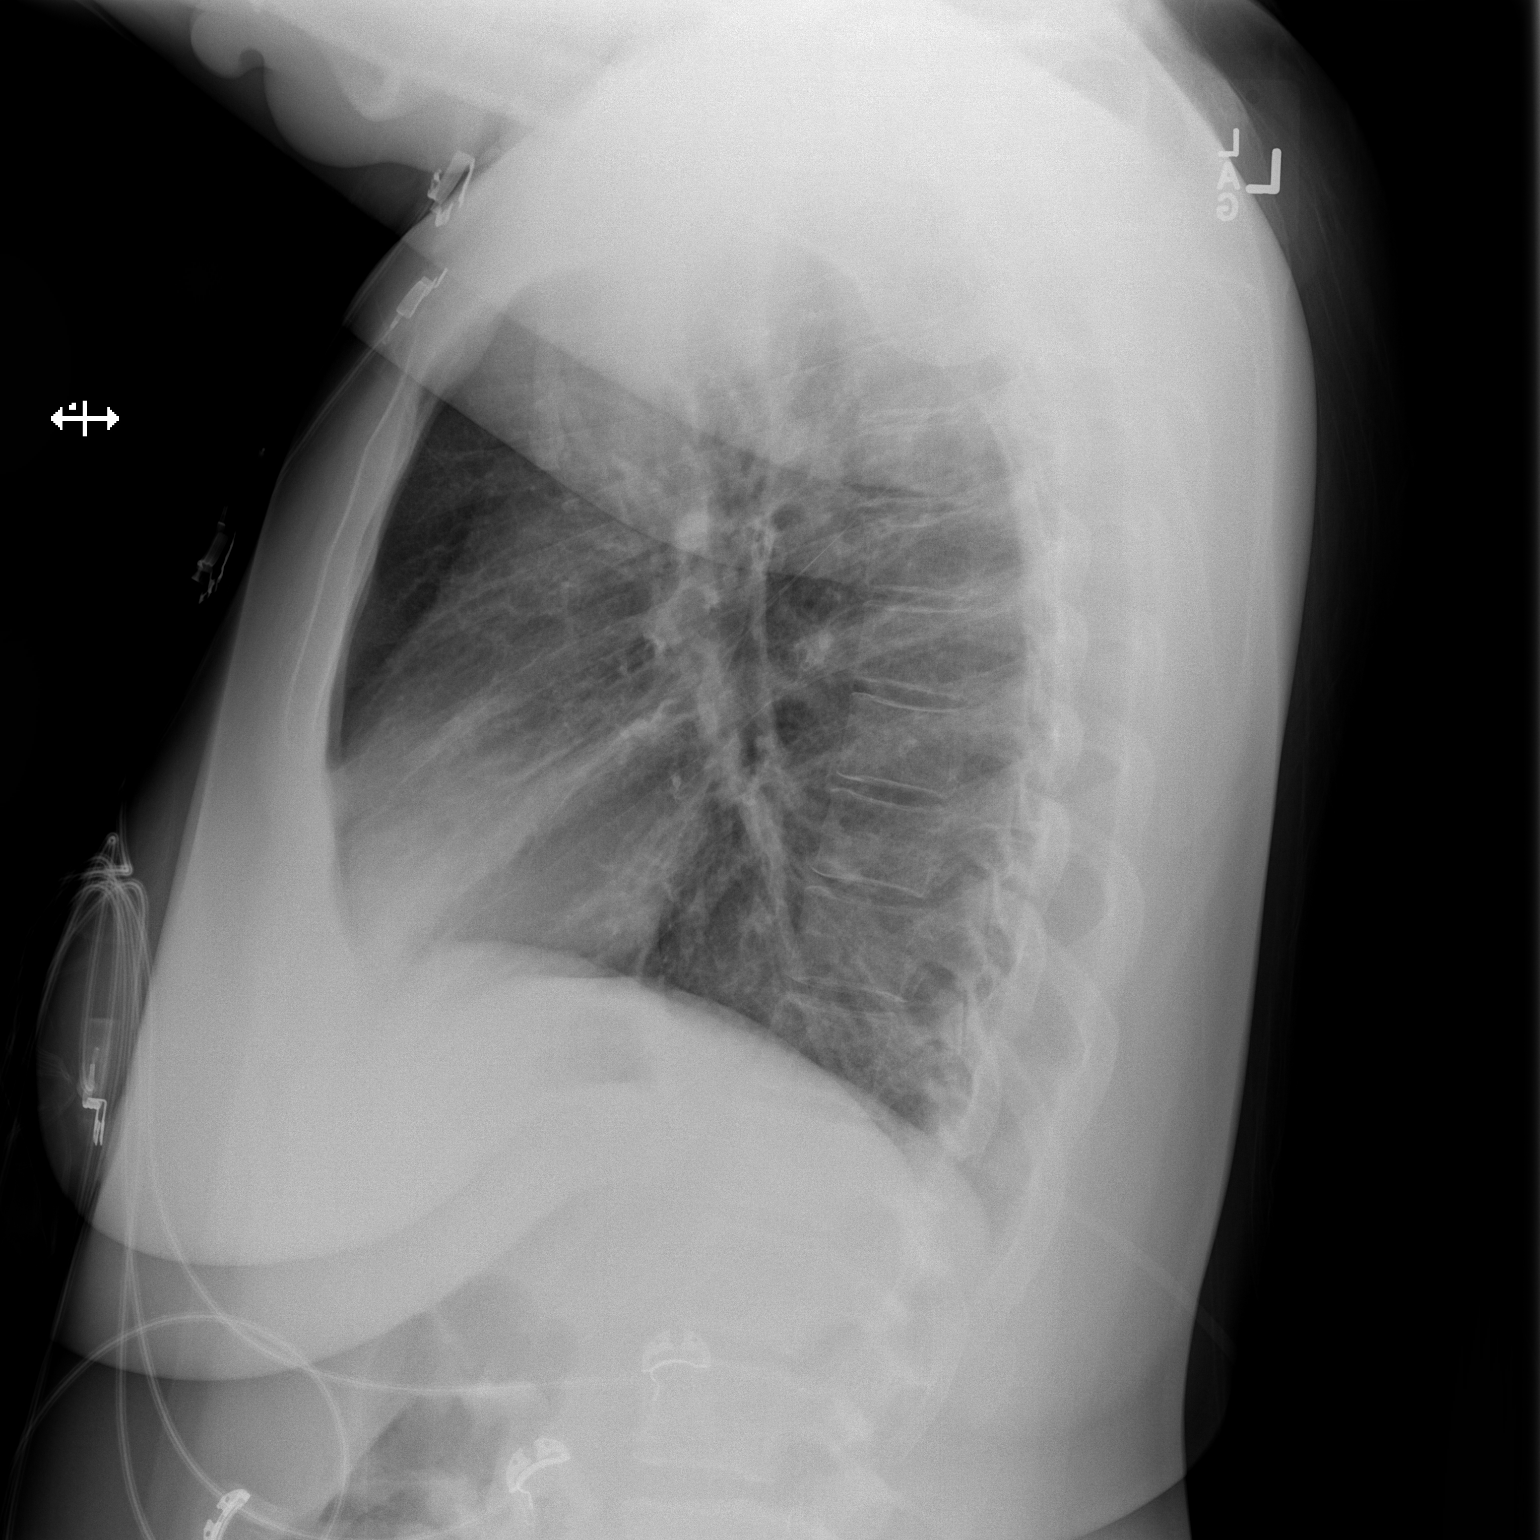

[2 of 2 positions shown; findings below may reference images not displayed]

FINDINGS: Normal heart size and pulmonary vascularity. Mild hyperinflation
with peribronchial thickening and streaky perihilar opacities.
Changes likely compatible with airways disease due to asthma. No
focal consolidation or airspace disease. No blunting of costophrenic
angles. No pneumothorax. Mediastinal contours appear intact.
IMPRESSION: Mild hyperinflation peribronchial changes likely related asthma. No
focal consolidation.

## 2015-11-19 MED ORDER — IPRATROPIUM-ALBUTEROL 0.5-2.5 (3) MG/3ML IN SOLN
3.0000 mL | Freq: Once | RESPIRATORY_TRACT | Status: AC
  Administered 2015-11-19: 3 mL via RESPIRATORY_TRACT
  Filled 2015-11-19: qty 3

## 2015-11-19 MED ORDER — SODIUM CHLORIDE 0.9 % IV BOLUS (SEPSIS)
1000.0000 mL | Freq: Once | INTRAVENOUS | Status: AC
Start: 2015-11-19 — End: 2015-11-19
  Administered 2015-11-19: 1000 mL via INTRAVENOUS

## 2015-11-19 MED ORDER — ALBUTEROL SULFATE HFA 108 (90 BASE) MCG/ACT IN AERS
2.0000 | INHALATION_SPRAY | Freq: Once | RESPIRATORY_TRACT | Status: AC
  Administered 2015-11-19: 2 via RESPIRATORY_TRACT
  Filled 2015-11-19: qty 6.7

## 2015-11-19 MED ORDER — PREDNISONE 10 MG PO TABS: 20.0000 mg | ORAL_TABLET | Freq: Every day | ORAL | 0 refills | Status: DC

## 2015-11-19 MED ORDER — PREDNISONE 20 MG PO TABS
60.0000 mg | ORAL_TABLET | Freq: Once | ORAL | Status: AC
  Administered 2015-11-19: 60 mg via ORAL
  Filled 2015-11-19: qty 3

## 2015-11-19 NOTE — Discharge Instructions (Signed)
Tonight you treated for asthma exacerbation.  You can started on a prednisone taper.  Please take all the tablets until completed.  Use your inhaler as follows 2 puffs every 4-6 hours while awake for 2 days then as needed.  1.  Normal

## 2015-11-19 NOTE — ED Triage Notes (Signed)
Pt BIB [facility] c/o SOB. Pt also c/o general weakness, cough, and fever at home. PT states that she has used 4 albuterol breathing treatments at home today without resolution of the SOB. Pt given 125 of Solumedrol and 1 DuoNeb in route. A&Ox4.

## 2015-11-19 NOTE — ED Notes (Signed)
Bed: [id number] Expected date:  Expected time:  Means of arrival:  Comments: 25 yo F Wheezing/SOB

## 2015-11-19 NOTE — ED Notes (Signed)
Provider at bedside

## 2015-11-19 NOTE — ED Notes (Addendum)
Pt states that she's really pissed off and she's not going to get her medication anyway. Pt declined to wait to explore options with social work when offered.

## 2015-11-19 NOTE — ED Notes (Signed)
This nurse went over discharge paperwork and prescription with pt; pt stated that she would not be getting prescription filled because it would be too expensive; this nurse offered to see if social work or case management in the morning; pt scoffed and declined this offer

## 2015-11-19 NOTE — ED Provider Notes (Signed)
[facility]-EMERGENCY DEPT Provider Note   CSN: [id number] Arrival date & time: 11/19/15  0121     History   Chief Complaint Chief Complaint  Patient presents with  . Shortness of Breath    HPI Brittany [name] is a 25 y.o. female.  25 year old with history of asthma who states for the past 3 days.  She's had increased shortness of breath using her inhaler frequently.  She she also has URI symptoms the past 2 days.  Denies fever      Past Medical History:  Diagnosis Date  . Asthma   . Chlamydia   . Gonorrhea   . Hx MRSA infection 2005  . NVD (normal vaginal delivery) 08/30/2010  . Trichimoniasis     Patient Active Problem List   Diagnosis Date Noted  . Status post normal vaginal delivery 09/05/2013  . Other specified indication for care or intervention related to labor and delivery, unspecified as to episode of care 09/03/2013  . Elevated blood pressure complicating pregnancy, antepartum 12/01/2011    Past Surgical History:  Procedure Laterality Date  . MULTIPLE TOOTH EXTRACTIONS    . NO PAST SURGERIES      OB History    Gravida Para Term Preterm AB Living   4 4 4     4    SAB TAB Ectopic Multiple Live Births           4       Home Medications    Prior to Admission medications   Medication Sig Start Date End Date Taking? Authorizing Provider  albuterol (PROVENTIL HFA;VENTOLIN HFA) 108 (90 BASE) MCG/ACT inhaler Inhale 2 puffs into the lungs every 4 (four) hours as needed for wheezing or shortness of breath. 03/21/14  Yes Tilden Fossa, MD  albuterol (PROVENTIL) (2.5 MG/3ML) 0.083% nebulizer solution Inhale 3 mLs into the lungs every 6 (six) hours as needed. For wheezing 10/23/14  Yes Historical Provider, MD  beclomethasone (QVAR) 40 MCG/ACT inhaler Inhale 1 puff into the lungs 2 (two) times daily. 12/15/14  Yes Leta Baptist, MD  cephALEXin (KEFLEX) 500 MG capsule Take 1 capsule (500 mg total) by mouth 4 (four) times daily. Patient not taking: Reported on  11/19/2015 07/23/15   Jerre Simon, PA  diphenhydrAMINE (BENADRYL) 25 MG tablet Take 2 tablets (50 mg total) by mouth every 4 (four) hours as needed for itching. Patient not taking: Reported on 11/19/2015 04/09/15   Joni Reining Pisciotta, PA-C  medroxyPROGESTERone (DEPO-PROVERA) 150 MG/ML injection Inject 1 mL (150 mg total) into the muscle every 3 (three) months. Patient not taking: Reported on 11/19/2015 09/06/13   Carrington Clamp, MD  methocarbamol (ROBAXIN) 500 MG tablet Take 1 tablet (500 mg total) by mouth 2 (two) times daily. Patient not taking: Reported on 04/09/2015 01/14/15   Lemont Fillers Kirichenko, PA-C  naproxen (NAPROSYN) 500 MG tablet Take 1 tablet (500 mg total) by mouth 2 (two) times daily. Patient not taking: Reported on 04/09/2015 01/14/15   Jaynie Crumble, PA-C  predniSONE (DELTASONE) 10 MG tablet Take 2 tablets (20 mg total) by mouth daily. 11/19/15   Earley Favor, NP  triamcinolone cream (KENALOG) 0.1 % Apply 1 application topically 2 (two) times daily. Patient not taking: Reported on 04/09/2015 12/15/14   Leta Baptist, MD    Family History Family History  Problem Relation Age of Onset  . Anesthesia problems Neg Hx   . Other Neg Hx   . Diabetes Maternal Grandmother   . Hypertension Father   . Heart disease Father   .  Lung disease Father     Social History Social History  Substance Use Topics  . Smoking status: Current Some Day Smoker    Packs/day: 0.25    Years: 5.00    Types: Cigarettes    Last attempt to quit: 12/27/2012  . Smokeless tobacco: Never Used  . Alcohol use No     Allergies   Iodine and Sulfonamide derivatives   Review of Systems Review of Systems  Genitourinary: Negative for dysuria.  All other systems reviewed and are negative.    Physical Exam Updated Vital Signs BP 120/84   Pulse 92   Temp 99.8 F (37.7 C) (Oral)   Resp (!) 31   Ht 5\' 5"  (1.651 m)   Wt 90.7 kg   LMP  (LMP Unknown) Comment: neg preg test  SpO2 95%   BMI 33.28  kg/m   Physical Exam  Constitutional: She appears well-developed and well-nourished.  Pulmonary/Chest: She has wheezes. She exhibits no tenderness.  Musculoskeletal: She exhibits no edema or tenderness.  Skin: Skin is warm.  Psychiatric: She has a normal mood and affect.  Vitals reviewed.    ED Treatments / Results  Labs (all labs ordered are listed, but only abnormal results are displayed) Labs Reviewed  CBC WITH DIFFERENTIAL/PLATELET - Abnormal; Notable for the following:       Result Value   WBC 13.2 (*)    Neutro Abs 11.8 (*)    All other components within normal limits  I-STAT CHEM 8, ED - Abnormal; Notable for the following:    Potassium 3.4 (*)    Glucose, Bld 113 (*)    All other components within normal limits  I-STAT BETA HCG BLOOD, ED ([facility], [facility], [facility] ONLY)    EKG  EKG Interpretation None       Radiology Dg Chest 2 View  Result Date: 11/19/2015 CLINICAL DATA:  Asthma, shortness of breath, and nausea. EXAM: CHEST  2 VIEW COMPARISON:  01/14/2015 FINDINGS: Normal heart size and pulmonary vascularity. Mild hyperinflation with peribronchial thickening and streaky perihilar opacities. Changes likely compatible with airways disease due to asthma. No focal consolidation or airspace disease. No blunting of costophrenic angles. No pneumothorax. Mediastinal contours appear intact. IMPRESSION: Mild hyperinflation peribronchial changes likely related asthma. No focal consolidation. Electronically Signed   By: Burman Nieves M.D.   On: 11/19/2015 04:46    Procedures Procedures (including critical care time)  Medications Ordered in ED Medications  predniSONE (DELTASONE) tablet 60 mg (not administered)  sodium chloride 0.9 % bolus 1,000 mL (1,000 mLs Intravenous New Bag/Given 11/19/15 0300)  ipratropium-albuterol (DUONEB) 0.5-2.5 (3) MG/3ML nebulizer solution 3 mL (3 mLs Nebulization Given 11/19/15 1610)     Initial Impression / Assessment and Plan / ED Course  I have  reviewed the triage vital signs and the nursing notes.  Pertinent labs & imaging results that were available during my care of the patient were reviewed by me and considered in my medical decision making (see chart for details).  Clinical Course  Patient was given 2 albuterol treatments in the emergency department including one with Atrovent with total resolution of her symptoms.  She had x-ray and lab work which all within normal permit.  She has been started on a prednisone burst for 5 days with instructions to use her inhaler 2 puffs every 4-6 hours while awake for 2 days then as needed and follow-up with her primary care physician    Final Clinical Impressions(s) / ED Diagnoses   Final diagnoses:  Mild intermittent asthma with exacerbation    New Prescriptions New Prescriptions   PREDNISONE (DELTASONE) 10 MG TABLET    Take 2 tablets (20 mg total) by mouth daily.     Earley FavorGail Marsella Suman, NP 11/19/15 0532    Earley FavorGail Fe Okubo, NP 11/19/15 [id number]    Mancel BaleElliott Wentz, MD 11/19/15 1945

## 2016-01-04 ENCOUNTER — Emergency Department (HOSPITAL_COMMUNITY): Payer: Medicaid Other

## 2016-01-04 ENCOUNTER — Emergency Department (HOSPITAL_COMMUNITY)
Admission: EM | Admit: 2016-01-04 | Discharge: 2016-01-04 | Disposition: A | Discharge status: Home / Self Care | Payer: Medicaid Other | Attending: Emergency Medicine | Admitting: Emergency Medicine

## 2016-01-04 ENCOUNTER — Encounter (HOSPITAL_COMMUNITY): Payer: Self-pay | Admitting: Emergency Medicine

## 2016-01-04 DIAGNOSIS — Z79899 Other long term (current) drug therapy: Secondary | ICD-10-CM | POA: Insufficient documentation

## 2016-01-04 DIAGNOSIS — R109 Unspecified abdominal pain: Secondary | ICD-10-CM

## 2016-01-04 DIAGNOSIS — F17210 Nicotine dependence, cigarettes, uncomplicated: Secondary | ICD-10-CM | POA: Insufficient documentation

## 2016-01-04 DIAGNOSIS — R1011 Right upper quadrant pain: Secondary | ICD-10-CM | POA: Insufficient documentation

## 2016-01-04 DIAGNOSIS — J45909 Unspecified asthma, uncomplicated: Secondary | ICD-10-CM | POA: Insufficient documentation

## 2016-01-04 LAB — CBC WITH DIFFERENTIAL/PLATELET
Basophils Absolute: 0 10*3/uL (ref 0.0–0.1)
Basophils Relative: 0 %
EOS ABS: 0.3 10*3/uL (ref 0.0–0.7)
Eosinophils Relative: 2 %
HEMATOCRIT: 34.4 % — AB (ref 36.0–46.0)
HEMOGLOBIN: 11.8 g/dL — AB (ref 12.0–15.0)
LYMPHS ABS: 2.8 10*3/uL (ref 0.7–4.0)
LYMPHS PCT: 24 %
MCH: 28.4 pg (ref 26.0–34.0)
MCHC: 34.3 g/dL (ref 30.0–36.0)
MCV: 82.7 fL (ref 78.0–100.0)
MONOS PCT: 10 %
Monocytes Absolute: 1.1 10*3/uL — ABNORMAL HIGH (ref 0.1–1.0)
NEUTROS PCT: 64 %
Neutro Abs: 7.5 10*3/uL (ref 1.7–7.7)
Platelets: 178 10*3/uL (ref 150–400)
RBC: 4.16 MIL/uL (ref 3.87–5.11)
RDW: 14.3 % (ref 11.5–15.5)
WBC: 11.6 10*3/uL — AB (ref 4.0–10.5)

## 2016-01-04 LAB — WET PREP, GENITAL
SPERM: NONE SEEN
TRICH WET PREP: NONE SEEN
YEAST WET PREP: NONE SEEN

## 2016-01-04 LAB — COMPREHENSIVE METABOLIC PANEL
ALT: 11 U/L — AB (ref 14–54)
AST: 17 U/L (ref 15–41)
Albumin: 4.2 g/dL (ref 3.5–5.0)
Alkaline Phosphatase: 56 U/L (ref 38–126)
Anion gap: 8 (ref 5–15)
BUN: 7 mg/dL (ref 6–20)
CHLORIDE: 105 mmol/L (ref 101–111)
CO2: 25 mmol/L (ref 22–32)
CREATININE: 0.77 mg/dL (ref 0.44–1.00)
Calcium: 8.7 mg/dL — ABNORMAL LOW (ref 8.9–10.3)
GFR calc non Af Amer: >60 mL/min (ref >60)
Glucose, Bld: 93 mg/dL (ref 65–99)
POTASSIUM: 3.1 mmol/L — AB (ref 3.5–5.1)
SODIUM: 138 mmol/L (ref 135–145)
Total Bilirubin: 0.7 mg/dL (ref 0.3–1.2)
Total Protein: 8 g/dL (ref 6.5–8.1)

## 2016-01-04 LAB — URINALYSIS, ROUTINE W REFLEX MICROSCOPIC
BILIRUBIN URINE: NEGATIVE
Glucose, UA: NEGATIVE mg/dL
Ketones, ur: NEGATIVE mg/dL
NITRITE: NEGATIVE
PH: 6.5 (ref 5.0–8.0)
Protein, ur: NEGATIVE mg/dL
SPECIFIC GRAVITY, URINE: 1.007 (ref 1.005–1.030)

## 2016-01-04 LAB — LIPASE, BLOOD: Lipase: 15 U/L (ref 11–51)

## 2016-01-04 LAB — URINE MICROSCOPIC-ADD ON

## 2016-01-04 LAB — I-STAT BETA HCG BLOOD, ED (MC, WL, AP ONLY): I-stat hCG, quantitative: <5 m[IU]/mL (ref <5)

## 2016-01-04 MED ORDER — NAPROXEN 500 MG PO TABS: 500.0000 mg | ORAL_TABLET | Freq: Two times a day (BID) | ORAL | 0 refills | Status: DC

## 2016-01-04 MED ORDER — MORPHINE SULFATE (PF) 4 MG/ML IV SOLN
6.0000 mg | Freq: Once | INTRAVENOUS | Status: AC
  Administered 2016-01-04: 6 mg via INTRAVENOUS
  Filled 2016-01-04: qty 2

## 2016-01-04 MED ORDER — FENTANYL CITRATE (PF) 100 MCG/2ML IJ SOLN
100.0000 ug | Freq: Once | INTRAMUSCULAR | Status: AC
  Administered 2016-01-04: 100 ug via INTRAVENOUS
  Filled 2016-01-04: qty 2

## 2016-01-04 NOTE — ED Triage Notes (Addendum)
Pt reports stabbing pain in her right lower side of abdomen.  Denies N/V/D/C or changes in urination.  Denies recent injury or hx abdom issues or kidney stones.  Afebrile.  Reports that pain has been occurring on and off for months but has become constant and inc over last three days.

## 2016-01-04 NOTE — Discharge Instructions (Signed)
We saw you in the ER for the abdominal pain. °All the results in the ER are normal, labs and imaging. °We are not sure what is causing your symptoms. °The workup in the ER is not complete, and is limited to screening for life threatening and emergent conditions only, so please see a primary care doctor for further evaluation. ° °Please return to the ER if your symptoms worsen; you have increased pain, fevers, chills, inability to keep any medications down, confusion. Otherwise see the outpatient doctor as requested. ° °

## 2016-01-04 NOTE — ED Notes (Signed)
[facility]

## 2016-01-04 NOTE — ED Notes (Signed)
Oral fluids given and tolerated

## 2016-01-04 NOTE — ED Provider Notes (Signed)
[facility]-EMERGENCY DEPT Provider Note   CSN: [id number] Arrival date & time: 01/04/16  0429     History   Chief Complaint Chief Complaint  Patient presents with  . Abdominal Pain    HPI Brittany [name] is a 25 y.o. female.  HPI Pt comes in with cc of abd pain. Abd pain is R sided and is constant x 3 days. Pt reports that the pain is sharp and non radiating. Pain has no specific aggravating or relieving factor. Pt denies n/v/f/c/diarrhea. She  Denies vaginal discharge. Pt has hx of STD but doesn't think she is having it right now. She deneis any dysuria, polyuria, hematuria.  Past Medical History:  Diagnosis Date  . Asthma   . Chlamydia   . Gonorrhea   . Hx MRSA infection 2005  . NVD (normal vaginal delivery) 08/30/2010  . Trichimoniasis     Patient Active Problem List   Diagnosis Date Noted  . Status post normal vaginal delivery 09/05/2013  . Other specified indication for care or intervention related to labor and delivery, unspecified as to episode of care 09/03/2013  . Elevated blood pressure complicating pregnancy, antepartum 12/01/2011    Past Surgical History:  Procedure Laterality Date  . MULTIPLE TOOTH EXTRACTIONS    . NO PAST SURGERIES      OB History    Gravida Para Term Preterm AB Living   4 4 4     4    SAB TAB Ectopic Multiple Live Births           4       Home Medications    Prior to Admission medications   Medication Sig Start Date End Date Taking? Authorizing Provider  albuterol (PROVENTIL HFA;VENTOLIN HFA) 108 (90 BASE) MCG/ACT inhaler Inhale 2 puffs into the lungs every 4 (four) hours as needed for wheezing or shortness of breath. 03/21/14  Yes Tilden Fossa, MD  albuterol (PROVENTIL) (2.5 MG/3ML) 0.083% nebulizer solution Inhale 3 mLs into the lungs every 6 (six) hours as needed for wheezing or shortness of breath. For wheezing  10/23/14  Yes Historical Provider, MD  beclomethasone (QVAR) 40 MCG/ACT inhaler Inhale 1 puff into the lungs 2  (two) times daily. 12/15/14  Yes Leta Baptist, MD  ibuprofen (ADVIL,MOTRIN) 200 MG tablet Take 400 mg by mouth every 6 (six) hours as needed for headache, mild pain or moderate pain.   Yes Historical Provider, MD  cephALEXin (KEFLEX) 500 MG capsule Take 1 capsule (500 mg total) by mouth 4 (four) times daily. Patient not taking: Reported on 01/04/2016 07/23/15   Jerre Simon, PA  medroxyPROGESTERone (DEPO-PROVERA) 150 MG/ML injection Inject 1 mL (150 mg total) into the muscle every 3 (three) months. Patient not taking: Reported on 01/04/2016 09/06/13   Carrington Clamp, MD  naproxen (NAPROSYN) 500 MG tablet Take 1 tablet (500 mg total) by mouth 2 (two) times daily. Patient not taking: Reported on 01/04/2016 01/14/15   Tatyana Kirichenko, PA-C  predniSONE (DELTASONE) 10 MG tablet Take 2 tablets (20 mg total) by mouth daily. Patient not taking: Reported on 01/04/2016 11/19/15   Earley Favor, NP    Family History Family History  Problem Relation Age of Onset  . Diabetes Maternal Grandmother   . Hypertension Father   . Heart disease Father   . Lung disease Father   . Anesthesia problems Neg Hx   . Other Neg Hx     Social History Social History  Substance Use Topics  . Smoking status: Current Some  Day Smoker    Packs/day: 0.25    Years: 5.00    Types: Cigarettes    Last attempt to quit: 12/27/2012  . Smokeless tobacco: Never Used  . Alcohol use No     Allergies   Iodine and Sulfonamide derivatives   Review of Systems Review of Systems  ROS 10 Systems reviewed and are negative for acute change except as noted in the HPI.    Physical Exam Updated Vital Signs BP 136/94   Pulse 73   Temp 98.2 F (36.8 C) (Oral)   Resp 18   Ht 5\' 5"  (1.651 m)   Wt 195 lb (88.5 kg)   LMP 12/05/2015   SpO2 95%   BMI 32.45 kg/m   Physical Exam  Constitutional: She is oriented to person, place, and time. She appears well-developed.  HENT:  Head: Normocephalic and atraumatic.    Eyes: Conjunctivae and EOM are normal. Pupils are equal, round, and reactive to light.  Neck: Normal range of motion. Neck supple.  Cardiovascular: Normal rate, regular rhythm, normal heart sounds and intact distal pulses.   No murmur heard. Pulmonary/Chest: Effort normal. No respiratory distress. She has no wheezes.  Abdominal: Soft. Bowel sounds are normal. She exhibits no distension. There is tenderness. There is no rebound and no guarding.  Right sided tenderness, worst in the RUQ. Neg Murphy's and neg mcnurney's point.  Genitourinary: Vagina normal and uterus normal.  Genitourinary Comments: External exam - normal, no lesions Speculum exam: Pt has no discharge, + blood Bimanual exam: Patient has no CMT, no adnexal tenderness or fullness and cervical os is closed  Neurological: She is alert and oriented to person, place, and time.  Skin: Skin is warm and dry.  Nursing note and vitals reviewed.    ED Treatments / Results  Labs (all labs ordered are listed, but only abnormal results are displayed) Labs Reviewed  COMPREHENSIVE METABOLIC PANEL - Abnormal; Notable for the following:       Result Value   Potassium 3.1 (*)    Calcium 8.7 (*)    ALT 11 (*)    All other components within normal limits  CBC WITH DIFFERENTIAL/PLATELET - Abnormal; Notable for the following:    WBC 11.6 (*)    Hemoglobin 11.8 (*)    HCT 34.4 (*)    Monocytes Absolute 1.1 (*)    All other components within normal limits  URINALYSIS, ROUTINE W REFLEX MICROSCOPIC (NOT AT [facility]) - Abnormal; Notable for the following:    APPearance CLOUDY (*)    Hgb urine dipstick LARGE (*)    Leukocytes, UA MODERATE (*)    All other components within normal limits  URINE MICROSCOPIC-ADD ON - Abnormal; Notable for the following:    Squamous Epithelial / LPF 0-5 (*)    Bacteria, UA MANY (*)    All other components within normal limits  WET PREP, GENITAL  LIPASE, BLOOD  I-STAT BETA HCG BLOOD, ED ([facility], [facility], [facility] ONLY)   GC/CHLAMYDIA PROBE AMP ([place]) NOT AT [facility]    EKG  EKG Interpretation None       Radiology No results found.  Procedures Procedures (including critical care time)  Medications Ordered in ED Medications  fentaNYL (SUBLIMAZE) injection 100 mcg (100 mcg Intravenous Given 01/04/16 0612)     Initial Impression / Assessment and Plan / ED Course  I have reviewed the triage vital signs and the nursing notes.  Pertinent labs & imaging results that were available during my care of  the patient were reviewed by me and considered in my medical decision making (see chart for details).  Clinical Course     Pt comes in with R sided abd pain. Abd pain is in the RUQ and RLQ, however, it is worse in the RUQ. Still, no peritoneal signs. Labs show mild WC elevation. US pending.  Pt has hx of STD. Pelvic exam was benign. Pt doesn't think she is having STD based on her hx of STD where the symptoms were different. We will screen her for infection.   Pt wants to get CT scan. I explained to her the pro and cons of CT scan - and how currently the risk doesn't outweigh the benefits. She understands that if her symptoms get worse, she  Can always come back to the ER and CT scan can certainly be entertained as part of her workup. Pt ok with that plan. Final Clinical Impressions(s) / ED Diagnoses   Final diagnoses:  None    New Prescriptions New Prescriptions   No medications on file     Derwood KaplanAnkit Evadne Ose, MD 01/04/16 [phone]

## 2016-01-05 LAB — GC/CHLAMYDIA PROBE AMP (~~LOC~~) NOT AT ARMC
Chlamydia: NEGATIVE
NEISSERIA GONORRHEA: NEGATIVE

## 2016-03-24 ENCOUNTER — Ambulatory Visit (HOSPITAL_COMMUNITY)
Admission: EM | Admit: 2016-03-24 | Discharge: 2016-03-24 | Disposition: A | Discharge status: Home / Self Care | Payer: Self-pay | Attending: Family Medicine | Admitting: Family Medicine

## 2016-03-24 ENCOUNTER — Ambulatory Visit (INDEPENDENT_AMBULATORY_CARE_PROVIDER_SITE_OTHER): Payer: Self-pay

## 2016-03-24 ENCOUNTER — Emergency Department (HOSPITAL_COMMUNITY)
Admission: EM | Admit: 2016-03-24 | Discharge: 2016-03-24 | Disposition: A | Discharge status: Home / Self Care | Payer: Medicaid Other | Attending: Dermatology | Admitting: Dermatology

## 2016-03-24 ENCOUNTER — Encounter (HOSPITAL_COMMUNITY): Payer: Self-pay | Admitting: Emergency Medicine

## 2016-03-24 DIAGNOSIS — Z5321 Procedure and treatment not carried out due to patient leaving prior to being seen by health care provider: Secondary | ICD-10-CM | POA: Insufficient documentation

## 2016-03-24 DIAGNOSIS — F17210 Nicotine dependence, cigarettes, uncomplicated: Secondary | ICD-10-CM | POA: Insufficient documentation

## 2016-03-24 DIAGNOSIS — R52 Pain, unspecified: Secondary | ICD-10-CM | POA: Insufficient documentation

## 2016-03-24 DIAGNOSIS — S46911A Strain of unspecified muscle, fascia and tendon at shoulder and upper arm level, right arm, initial encounter: Secondary | ICD-10-CM

## 2016-03-24 DIAGNOSIS — Z79899 Other long term (current) drug therapy: Secondary | ICD-10-CM | POA: Insufficient documentation

## 2016-03-24 DIAGNOSIS — J45909 Unspecified asthma, uncomplicated: Secondary | ICD-10-CM | POA: Insufficient documentation
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
## 2016-03-24 MED ORDER — DICLOFENAC SODIUM 75 MG PO TBEC: 75.0000 mg | DELAYED_RELEASE_TABLET | Freq: Two times a day (BID) | ORAL | 0 refills | Status: DC

## 2016-03-24 MED ORDER — CYCLOBENZAPRINE HCL 10 MG PO TABS: 10.0000 mg | ORAL_TABLET | Freq: Two times a day (BID) | ORAL | 0 refills | Status: DC | PRN

## 2016-03-24 NOTE — Discharge Instructions (Signed)
Dr. Milus GlazierLauenstein and I have reviewed your xrays. We see no fractures, dislocations, swelling or other acute findings. I have prescribed a medicine called Diclofenac for pain. Take 1 tablet twice a day, and a muscle relaxant called Flexeril. Take one tablet twice a day. This medication may cause drowsiness. Do not drink or drive while taking. Should your pain persist past 1 week, follow up with an orthopedic doctor.
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
## 2016-04-21 ENCOUNTER — Encounter (HOSPITAL_COMMUNITY): Payer: Self-pay | Admitting: Emergency Medicine

## 2016-04-21 ENCOUNTER — Emergency Department (HOSPITAL_COMMUNITY)
Admission: EM | Admit: 2016-04-21 | Discharge: 2016-04-21 | Disposition: A | Discharge status: Home / Self Care | Payer: Self-pay | Attending: Emergency Medicine | Admitting: Emergency Medicine

## 2016-04-21 ENCOUNTER — Emergency Department (HOSPITAL_COMMUNITY): Payer: Self-pay

## 2016-04-21 DIAGNOSIS — F17210 Nicotine dependence, cigarettes, uncomplicated: Secondary | ICD-10-CM | POA: Insufficient documentation

## 2016-04-21 DIAGNOSIS — J45909 Unspecified asthma, uncomplicated: Secondary | ICD-10-CM | POA: Insufficient documentation

## 2016-04-21 DIAGNOSIS — J36 Peritonsillar abscess: Secondary | ICD-10-CM | POA: Insufficient documentation

## 2016-04-21 DIAGNOSIS — J029 Acute pharyngitis, unspecified: Secondary | ICD-10-CM

## 2016-04-21 DIAGNOSIS — Z79899 Other long term (current) drug therapy: Secondary | ICD-10-CM | POA: Insufficient documentation

## 2016-04-21 LAB — I-STAT CHEM 8, ED
BUN: 4 mg/dL — AB (ref 6–20)
CALCIUM ION: 1.08 mmol/L — AB (ref 1.15–1.40)
CHLORIDE: 103 mmol/L (ref 101–111)
CREATININE: 0.8 mg/dL (ref 0.44–1.00)
Glucose, Bld: 106 mg/dL — ABNORMAL HIGH (ref 65–99)
HCT: 42 % (ref 36.0–46.0)
Hemoglobin: 14.3 g/dL (ref 12.0–15.0)
Potassium: 3.4 mmol/L — ABNORMAL LOW (ref 3.5–5.1)
Sodium: 141 mmol/L (ref 135–145)
TCO2: 29 mmol/L (ref 0–100)

## 2016-04-21 LAB — RAPID STREP SCREEN (MED CTR MEBANE ONLY): Streptococcus, Group A Screen (Direct): NEGATIVE

## 2016-04-21 LAB — POC URINE PREG, ED: Preg Test, Ur: NEGATIVE

## 2016-04-21 IMAGING — CT CT NECK W/ CM
4 series · 14 of 33 positions shown, 17 images · IV contrast (iopamidol)
Comparison: None.

CLINICAL DATA: 25 y/o F; probe pain with onset yesterday. Concern
for peritonsillar abscess.

EXAM:
CT NECK WITH CONTRAST
TECHNIQUE: Multidetector CT imaging of the neck was performed using the
standard protocol following the bolus administration of intravenous
contrast.
CONTRAST:  1 [K8] IOPAMIDOL ([K8]) INJECTION 61%

[Series 3: neck 2.0 i31s 3 · axial · 0.48mm/px · z∈[-240,-102]mm · 5 of 105 slices shown, 7 images]
[im 18/105  soft-tissue]
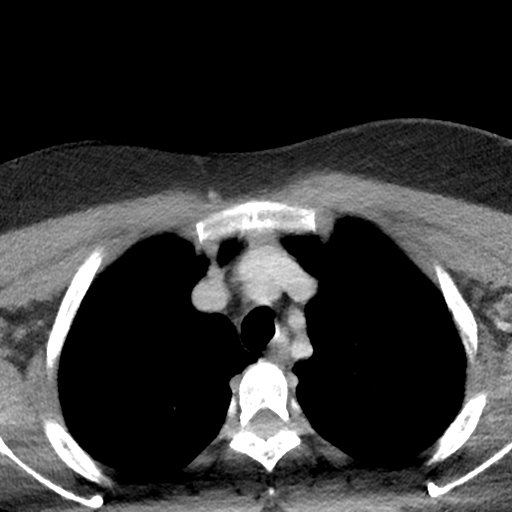
[im 18/105  bone]
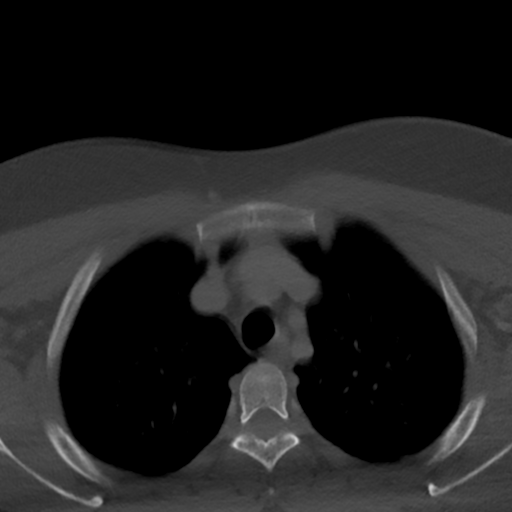
[im 35/105  bone]
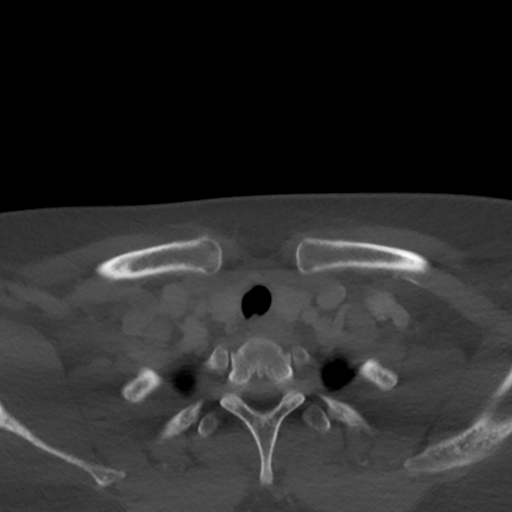
[im 53/105  bone]
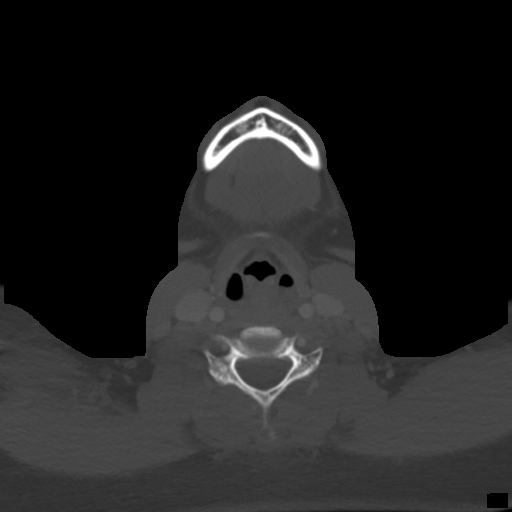
[im 70/105  bone]
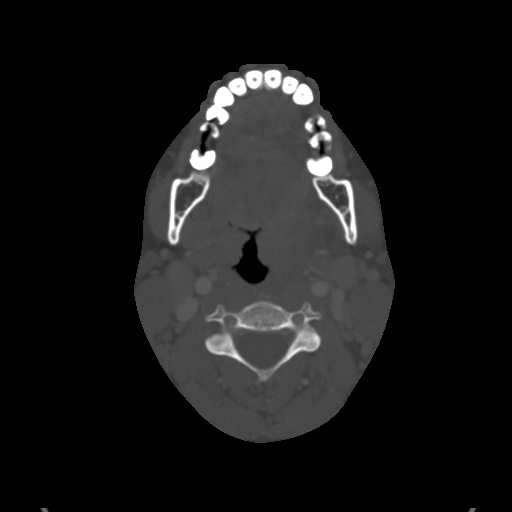
[im 87/105  soft-tissue]
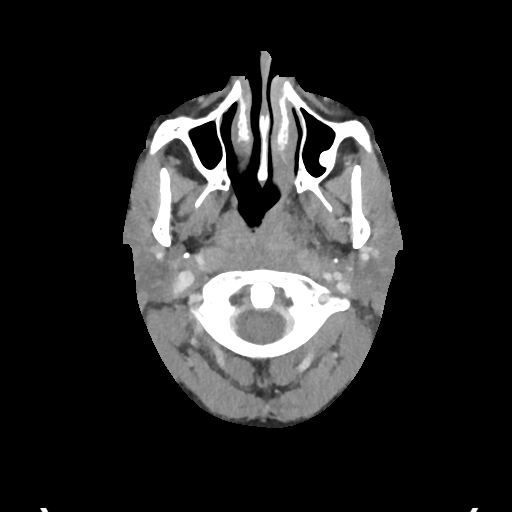
[im 87/105  bone]
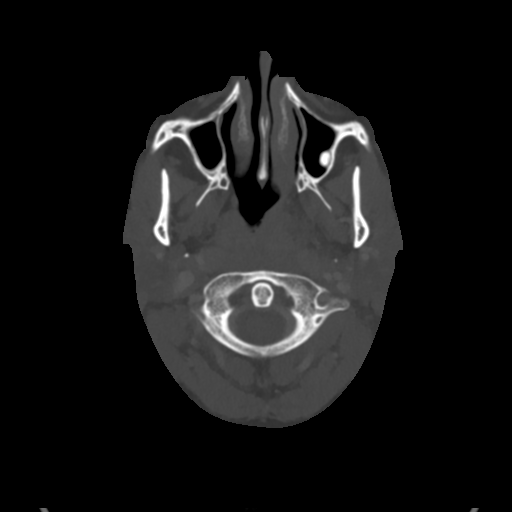

[Series 6: coronal st · coronal · 0.41mm/px · 3 of 106 slices shown]
[im 22/106  bone]
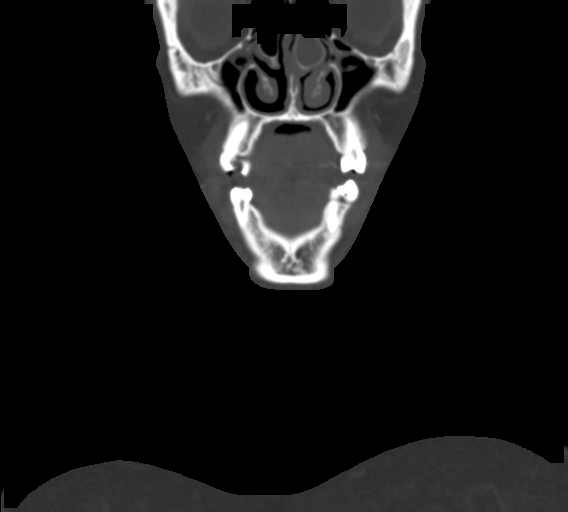
[im 43/106  bone]
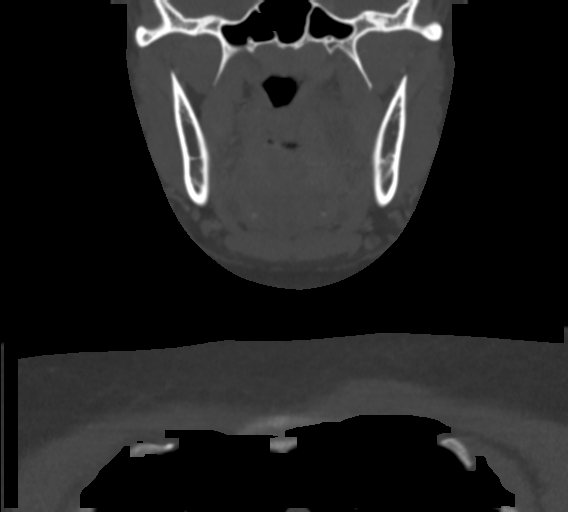
[im 64/106  bone]
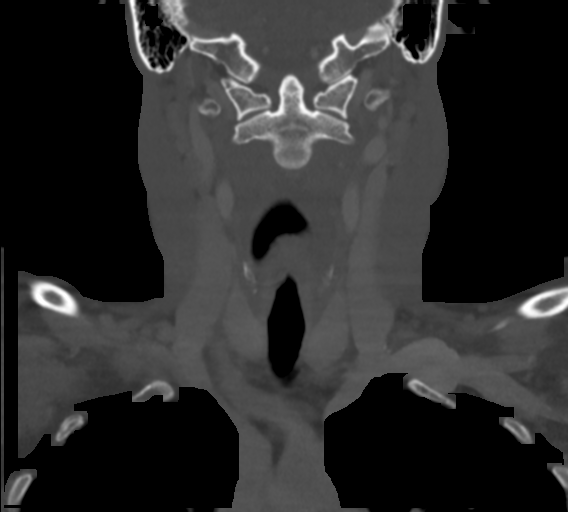

[Series 7: sagittal st · sagittal · 0.41mm/px · 5 of 101 slices shown, 6 images]
[im 34/101  bone]
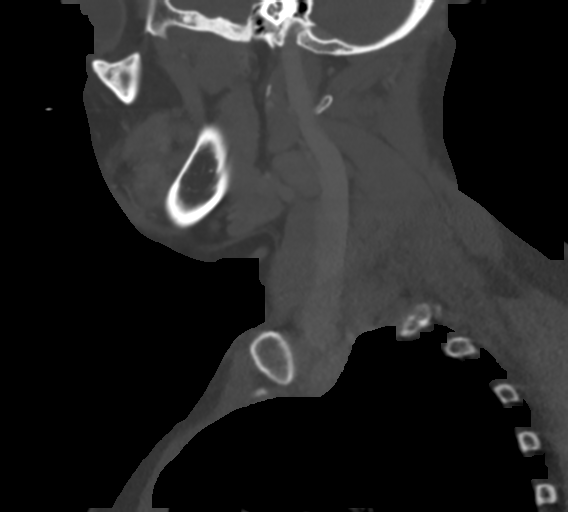
[im 42/101  bone]
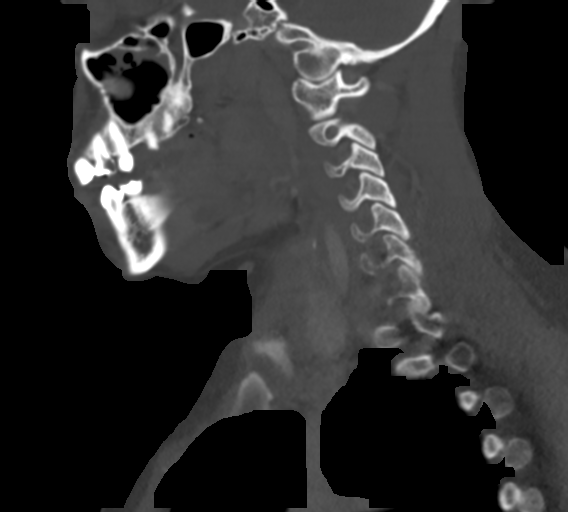
[im 51/101  soft-tissue]
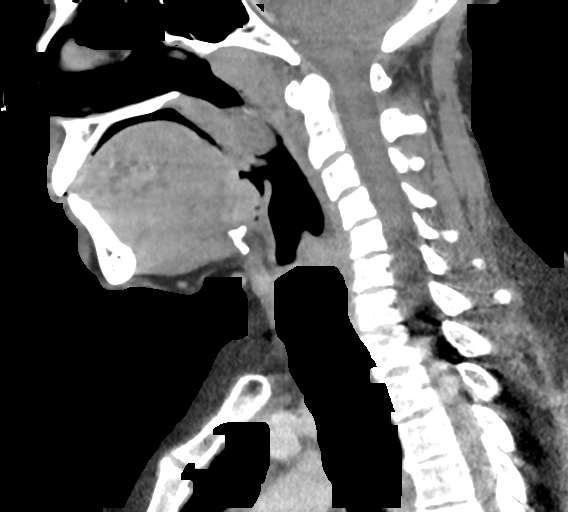
[im 51/101  bone]
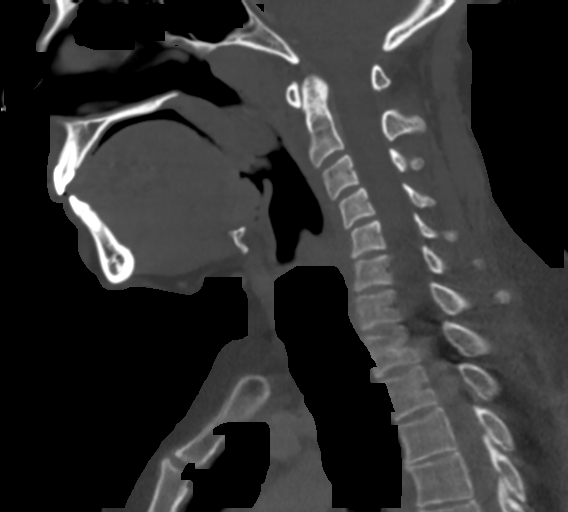
[im 59/101  bone]
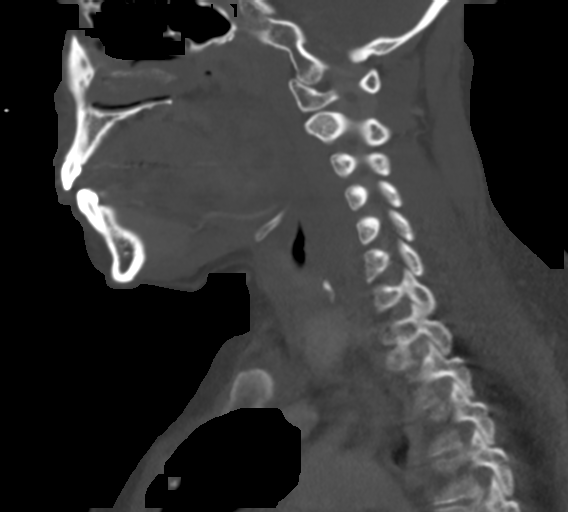
[im 67/101  bone]
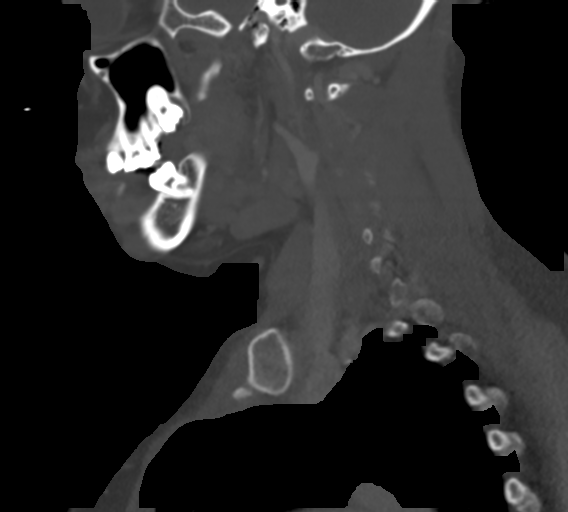

[Series 8: orthogonal st · axial · 0.39mm/px · 1 of 104 slices shown]
[im 18/104  bone]
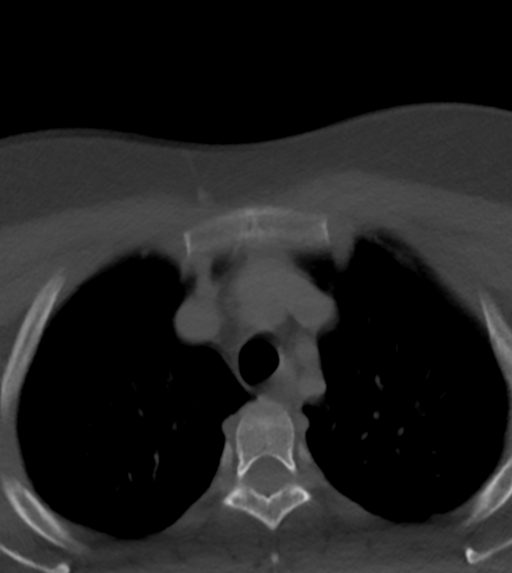

[14 of 33 positions shown; findings below may reference images not displayed]

FINDINGS: Pharynx and larynx: Swelling of lingual, palatini, and adenoid
tonsils. Left-sided palatini tonsil abscess measuring 17 x 11 x 17
mm (AP x ML x CC series 3, image 30 and series 48, image 107).
Contiguous hypoattenuation extending from the abscess into the left
parapharyngeal fat (series 6, image 49) and the left aspect of
adenoid tonsils (series 7, image [DATE] represent abscess
loculation/ phlegmon. Additionally, there is extensive oropharyngeal
mucosal edema extending into the hypopharynx greater on the left and
posteriorly. No significant retropharyngeal fluid collection.

Salivary glands: Mild enlargement of the bilateral submandibular
gland ducts without obstructing lesion or stone. The no significant
inflammatory changes.

Thyroid: Normal.

Lymph nodes: Bilateral upper cervical lymphadenopathy greater on the
left is likely reactive. No necrotic changes.

Vascular: Negative.

Limited intracranial: Negative.

Visualized orbits: Negative.

Mastoids and visualized paranasal sinuses: Maxillary and anterior
ethmoid air cell mucosal thickening. Normally aerated visualized
mastoid air cells.

Skeleton: No acute or aggressive process.

Upper chest: Negative.

Other: None.
IMPRESSION: 1. Tonsil swelling and mucosal swelling of oropharynx and
hypopharynx greater on the left and posteriorly compatible with
pharyngitis.
2. Left-sided palatini tonsil loculated abscess measuring up to 17
mm. There appears to be loculations or phlegmon extending from the
abscess into the left lateral adenoid tonsil and the left-sided
parapharyngeal fat.
3. Upper cervical lymphadenopathy is likely reactive. No necrotic
changes.
4. Mild paranasal sinus disease.

By: HJANIN M.D.

## 2016-04-21 MED ORDER — KETOROLAC TROMETHAMINE 30 MG/ML IJ SOLN
30.0000 mg | Freq: Once | INTRAMUSCULAR | Status: AC
  Administered 2016-04-21: 30 mg via INTRAVENOUS
  Filled 2016-04-21: qty 1

## 2016-04-21 MED ORDER — SODIUM CHLORIDE 0.9 % IV BOLUS (SEPSIS)
1000.0000 mL | Freq: Once | INTRAVENOUS | Status: AC
  Administered 2016-04-21: 1000 mL via INTRAVENOUS

## 2016-04-21 MED ORDER — IBUPROFEN 100 MG/5ML PO SUSP: 800.0000 mg | Freq: Four times a day (QID) | ORAL | 0 refills | Status: DC | PRN

## 2016-04-21 MED ORDER — AMOXICILLIN 250 MG/5ML PO SUSR
1000.0000 mg | Freq: Once | ORAL | Status: AC
  Administered 2016-04-21: 1000 mg via ORAL
  Filled 2016-04-21 (×2): qty 20

## 2016-04-21 MED ORDER — AMOXICILLIN 250 MG/5ML PO SUSR: ORAL | 0 refills | Status: DC

## 2016-04-21 MED ORDER — DEXAMETHASONE SODIUM PHOSPHATE 10 MG/ML IJ SOLN
10.0000 mg | Freq: Once | INTRAMUSCULAR | Status: AC
  Administered 2016-04-21: 10 mg via INTRAVENOUS
  Filled 2016-04-21: qty 1

## 2016-04-21 MED ORDER — IOPAMIDOL (ISOVUE-300) INJECTION 61%
INTRAVENOUS | Status: AC
  Administered 2016-04-21: 75 mL
  Filled 2016-04-21: qty 75

## 2016-04-21 NOTE — ED Triage Notes (Signed)
Pt reports sore throat x2days. Tonsils appear red and swollen.

## 2016-04-21 NOTE — ED Provider Notes (Signed)
[facility]-EMERGENCY DEPT Provider Note   CSN: [id number] Arrival date & time: 04/21/16  1257   By signing my name below, I, Freida Busmaniana Omoyeni, attest that this documentation has been prepared under the direction and in the presence of Lyndal Pulleyaniel Shuntell Foody, MD . Electronically Signed: Freida Busmaniana Omoyeni, Scribe. 04/21/2016. 1:37 PM.  History   Chief Complaint Chief Complaint  Patient presents with  . Sore Throat    The history is provided by the patient. No language interpreter was used.     HPI Comments:  Brittany [name] is a 26 y.o. female who presents to the Emergency Department complaining of sore throat since yesterday with 10/10 pain. Pain is worse on the left side of her throat. She reports difficulty swallowing and decrease PO intake due to pain. She also reports associated chills. Pt has a h/o strep. Denies cough. No alleviating factors noted.   Past Medical History:  Diagnosis Date  . Asthma   . Chlamydia   . Gonorrhea   . Hx MRSA infection 2005  . NVD (normal vaginal delivery) 08/30/2010  . Trichimoniasis     Patient Active Problem List   Diagnosis Date Noted  . Status post normal vaginal delivery 09/05/2013  . Other specified indication for care or intervention related to labor and delivery, unspecified as to episode of care 09/03/2013  . Elevated blood pressure complicating pregnancy, antepartum 12/01/2011    Past Surgical History:  Procedure Laterality Date  . MULTIPLE TOOTH EXTRACTIONS    . NO PAST SURGERIES      OB History    Gravida Para Term Preterm AB Living   4 4 4     4    SAB TAB Ectopic Multiple Live Births           4       Home Medications    Prior to Admission medications   Medication Sig Start Date End Date Taking? Authorizing Provider  albuterol (PROVENTIL HFA;VENTOLIN HFA) 108 (90 BASE) MCG/ACT inhaler Inhale 2 puffs into the lungs every 4 (four) hours as needed for wheezing or shortness of breath. 03/21/14   Tilden FossaElizabeth Rees, MD  albuterol (PROVENTIL)  (2.5 MG/3ML) 0.083% nebulizer solution Inhale 3 mLs into the lungs every 6 (six) hours as needed for wheezing or shortness of breath. For wheezing  10/23/14   Historical Provider, MD  amoxicillin (AMOXIL) 250 MG/5ML suspension Take 1000 mg 3 times a day for 3 days followed by 500 mg 3 times a day for 7 days for total of 10 days of therapy 04/21/16   Lyndal Pulleyaniel Aolani Piggott, MD  beclomethasone (QVAR) 40 MCG/ACT inhaler Inhale 1 puff into the lungs 2 (two) times daily. 12/15/14   Leta BaptistEmily Roe Nguyen, MD  cephALEXin (KEFLEX) 500 MG capsule Take 1 capsule (500 mg total) by mouth 4 (four) times daily. Patient not taking: Reported on 01/04/2016 07/23/15   Jerre SimonJessica L Focht, PA  cyclobenzaprine (FLEXERIL) 10 MG tablet Take 1 tablet (10 mg total) by mouth 2 (two) times daily as needed for muscle spasms. 03/24/16   Dorena BodoLawrence Kennard, NP  diclofenac (VOLTAREN) 75 MG EC tablet Take 1 tablet (75 mg total) by mouth 2 (two) times daily. 03/24/16   Dorena BodoLawrence Kennard, NP  ibuprofen (ADVIL,MOTRIN) 100 MG/5ML suspension Take 40 mLs (800 mg total) by mouth every 6 (six) hours as needed for moderate pain (sore throat). 04/21/16   Lyndal Pulleyaniel Aarya Robinson, MD  medroxyPROGESTERone (DEPO-PROVERA) 150 MG/ML injection Inject 1 mL (150 mg total) into the muscle every 3 (three) months. Patient not  taking: Reported on 01/04/2016 09/06/13   Carrington Clamp, MD  naproxen (NAPROSYN) 500 MG tablet Take 1 tablet (500 mg total) by mouth 2 (two) times daily with a meal. 01/04/16   Derwood Kaplan, MD  predniSONE (DELTASONE) 10 MG tablet Take 2 tablets (20 mg total) by mouth daily. Patient not taking: Reported on 01/04/2016 11/19/15   Earley Favor, NP    Family History Family History  Problem Relation Age of Onset  . Diabetes Maternal Grandmother   . Hypertension Father   . Heart disease Father   . Lung disease Father   . Anesthesia problems Neg Hx   . Other Neg Hx     Social History Social History  Substance Use Topics  . Smoking status: Current Some Day Smoker     Packs/day: 0.25    Years: 5.00    Types: Cigarettes    Last attempt to quit: 12/27/2012  . Smokeless tobacco: Never Used  . Alcohol use No     Allergies   Sulfonamide derivatives   Review of Systems Review of Systems  Constitutional: Positive for chills.  HENT: Positive for sore throat.   Respiratory: Negative for cough.   All other systems reviewed and are negative.    Physical Exam Updated Vital Signs BP 138/100 (BP Location: Right Arm)   Pulse 96   Temp 99.5 F (37.5 C) (Oral)   Resp 16   Ht 5\' 5"  (1.651 m)   Wt 200 lb (90.7 kg)   SpO2 99%   BMI 33.28 kg/m   Physical Exam  Constitutional: She is oriented to person, place, and time. She appears well-developed and well-nourished. No distress.  HENT:  Head: Normocephalic.  Nose: Nose normal.  Mouth/Throat: Posterior oropharyngeal edema present. No oropharyngeal exudate. Tonsils are 2+ on the right. Tonsils are 3+ on the left. No tonsillar exudate.  Muffled voice with 2+ right tonsil, 3+ left tonsil  Mild diffuse oropharyngeal edema No exudate  No stridor   Eyes: Conjunctivae are normal.  Neck: Neck supple. No tracheal deviation present.  Cardiovascular: Normal rate and regular rhythm.   Pulmonary/Chest: Effort normal. No stridor. No respiratory distress.  Abdominal: Soft. She exhibits no distension.  Neurological: She is alert and oriented to person, place, and time.  Skin: Skin is warm and dry.  Psychiatric: She has a normal mood and affect.  Nursing note and vitals reviewed.    ED Treatments / Results  DIAGNOSTIC STUDIES:  Oxygen Saturation is 99% on RA, normal by my interpretation.    COORDINATION OF CARE:  1:32 PM Will order rapid strep.  Discussed treatment plan with pt at bedside and pt agreed to plan.   Labs (all labs ordered are listed, but only abnormal results are displayed) Labs Reviewed  I-STAT CHEM 8, ED - Abnormal; Notable for the following:       Result Value   Potassium 3.4 (*)      BUN 4 (*)    Glucose, Bld 106 (*)    Calcium, Ion 1.08 (*)    All other components within normal limits  RAPID STREP SCREEN (NOT AT [facility])  CULTURE, GROUP A STREP ([facility])  POC URINE PREG, ED    EKG  EKG Interpretation None       Radiology Ct Soft Tissue Neck W Contrast  Result Date: 04/21/2016 CLINICAL DATA:  26 y/o F; probe pain with onset yesterday. Concern for peritonsillar abscess. EXAM: CT NECK WITH CONTRAST TECHNIQUE: Multidetector CT imaging of the neck was performed using  the standard protocol following the bolus administration of intravenous contrast. CONTRAST:  1 ISOVUE-300 IOPAMIDOL (ISOVUE-300) INJECTION 61% COMPARISON:  None. FINDINGS: Pharynx and larynx: Swelling of lingual, palatini, and adenoid tonsils. Left-sided palatini tonsil abscess measuring 17 x 11 x 17 mm (AP x ML x CC series 3, image 30 and series 48, image 107). Contiguous hypoattenuation extending from the abscess into the left parapharyngeal fat (series 6, image 49) and the left aspect of adenoid tonsils (series 7, image 60) may represent abscess loculation/ phlegmon. Additionally, there is extensive oropharyngeal mucosal edema extending into the hypopharynx greater on the left and posteriorly. No significant retropharyngeal fluid collection. Salivary glands: Mild enlargement of the bilateral submandibular gland ducts without obstructing lesion or stone. The no significant inflammatory changes. Thyroid: Normal. Lymph nodes: Bilateral upper cervical lymphadenopathy greater on the left is likely reactive. No necrotic changes. Vascular: Negative. Limited intracranial: Negative. Visualized orbits: Negative. Mastoids and visualized paranasal sinuses: Maxillary and anterior ethmoid air cell mucosal thickening. Normally aerated visualized mastoid air cells. Skeleton: No acute or aggressive process. Upper chest: Negative. Other: None. IMPRESSION: 1. Tonsil swelling and mucosal swelling of oropharynx and hypopharynx greater on  the left and posteriorly compatible with pharyngitis. 2. Left-sided palatini tonsil loculated abscess measuring up to 17 mm. There appears to be loculations or phlegmon extending from the abscess into the left lateral adenoid tonsil and the left-sided parapharyngeal fat. 3. Upper cervical lymphadenopathy is likely reactive. No necrotic changes. 4. Mild paranasal sinus disease. Electronically Signed   By: Mitzi Hansen M.D.   On: 04/21/2016 16:51    Procedures Procedures (including critical care time)  Medications Ordered in ED Medications  amoxicillin (AMOXIL) 250 MG/5ML suspension 1,000 mg (not administered)  sodium chloride 0.9 % bolus 1,000 mL (0 mLs Intravenous Stopped 04/21/16 1702)  dexamethasone (DECADRON) injection 10 mg (10 mg Intravenous Given 04/21/16 1352)  ketorolac (TORADOL) 30 MG/ML injection 30 mg (30 mg Intravenous Given 04/21/16 1352)  iopamidol (ISOVUE-300) 61 % injection (75 mLs  Contrast Given 04/21/16 1559)     Initial Impression / Assessment and Plan / ED Course  I have reviewed the triage vital signs and the nursing notes.  Pertinent labs & imaging results that were available during my care of the patient were reviewed by me and considered in my medical decision making (see chart for details).     Patient presents with 2 days of ongoing sore throat with upper respiratory infection symptoms and low-grade fever. Pt with negative strep. Asymmetry noted in the oropharynx concerning for possible early PTA. CT ordered to evaluate soft tissues. Mild dehydration evident and provided IV fluids. Decadron and Toradol provided for supportive care and symptom control. Pt is tolerating secretions. Patent airway.   The patient had an allergy to iodine listed on the chart. When asked about this the patient states that she had an abscess on her face after swimming at the beach and was told that she had a shellfish/iodine allergy because of this. This appears to have been  misclassified and was taken off of the chart as it is not an iodine allergy and is in no way related to iodine exposure.  Small abscess noted in tonsil on CT on the left as clinically suspected. Discussed with Dr. Lazarus Salines who is on-call for ENT and he recommended high-dose amoxicillin therapy and offered to follow the patient in clinic tomorrow if she does not feel that she has made progress with the steroid therapy from today and antibiotic therapy.  Plan  to follow up with PCP as needed and return precautions discussed for worsening or new concerning symptoms.    Final Clinical Impressions(s) / ED Diagnoses   Final diagnoses:  Peritonsillar abscess  Sore throat    New Prescriptions New Prescriptions   AMOXICILLIN (AMOXIL) 250 MG/5ML SUSPENSION    Take 1000 mg 3 times a day for 3 days followed by 500 mg 3 times a day for 7 days for total of 10 days of therapy   IBUPROFEN (ADVIL,MOTRIN) 100 MG/5ML SUSPENSION    Take 40 mLs (800 mg total) by mouth every 6 (six) hours as needed for moderate pain (sore throat).   I personally performed the services described in this documentation, which was scribed in my presence. The recorded information has been reviewed and is accurate.     Lyndal Pulley, MD 04/21/16 [phone]

## 2016-04-22 LAB — CULTURE, GROUP A STREP (THRC)

## 2016-04-23 ENCOUNTER — Telehealth: Payer: Self-pay | Admitting: *Deleted

## 2016-04-23 NOTE — Telephone Encounter (Signed)
Post ED Visit - Positive Culture Follow-up  Culture report reviewed by antimicrobial stewardship pharmacist:  []  Enzo BiNathan Batchelder, Pharm.D. []  Celedonio MiyamotoJeremy Frens, Pharm.D., BCPS []  Garvin FilaMike Maccia, Pharm.D. []  Georgina PillionElizabeth Martin, Pharm.D., BCPS []  FlemingtonMinh Pham, [address]Ph[address]D., BCPS, AAHIVP [x]  Estella HuskMichelle Turner, Pharm.D., BCPS, AAHIVP []  Tennis Mustassie Stewart, Pharm.D. []  Rob Oswaldo DoneVincent, [address]Pharm[address]  Positive strep culture Treated with Amoxicillin, organism sensitive to the same and no further patient follow-up is required at this time.  Virl AxeRobertson, Betty Brooks [facility] 04/23/2016, 1:24 PM

## 2016-06-16 ENCOUNTER — Ambulatory Visit (HOSPITAL_COMMUNITY)
Admission: EM | Admit: 2016-06-16 | Discharge: 2016-06-16 | Disposition: A | Discharge status: Home / Self Care | Payer: Self-pay | Attending: Emergency Medicine | Admitting: Emergency Medicine

## 2016-06-16 ENCOUNTER — Encounter (HOSPITAL_COMMUNITY): Payer: Self-pay | Admitting: Family Medicine

## 2016-06-16 DIAGNOSIS — M76892 Other specified enthesopathies of left lower limb, excluding foot: Secondary | ICD-10-CM

## 2016-06-16 DIAGNOSIS — M25562 Pain in left knee: Secondary | ICD-10-CM

## 2016-06-16 DIAGNOSIS — K047 Periapical abscess without sinus: Secondary | ICD-10-CM

## 2016-06-16 DIAGNOSIS — K029 Dental caries, unspecified: Secondary | ICD-10-CM

## 2016-06-16 DIAGNOSIS — K0889 Other specified disorders of teeth and supporting structures: Secondary | ICD-10-CM

## 2016-06-16 MED ORDER — AMOXICILLIN 500 MG PO CAPS
1000.0000 mg | ORAL_CAPSULE | Freq: Two times a day (BID) | ORAL | 0 refills | Status: DC
Start: 2016-06-16 — End: 2017-07-09

## 2016-06-16 MED ORDER — HYDROCODONE-ACETAMINOPHEN 5-325 MG PO TABS: 1.0000 | ORAL_TABLET | ORAL | 0 refills | Status: DC | PRN

## 2016-06-16 MED ORDER — NAPROXEN 375 MG PO TABS: 375.0000 mg | ORAL_TABLET | Freq: Two times a day (BID) | ORAL | 0 refills | Status: DC

## 2016-06-16 NOTE — ED Triage Notes (Signed)
Pt here for abscessed tooth and dental pain, sts also left knee pain.

## 2016-06-16 NOTE — Discharge Instructions (Signed)
Apply ice to the areas of pain along her knee. No running, jumping or rapid walking for the next week or so. Follow-up with dentist as soon as possible. Obtain a primary care provider as soon as possible. Make called immediately health and wellness to establish a primary care provider.

## 2016-06-16 NOTE — ED Provider Notes (Signed)
CSN: [id number]     Arrival date & time 06/16/16  7829 History   None    Chief Complaint  Patient presents with  . Dental Pain  . Knee Pain   (Consider location/radiation/quality/duration/timing/severity/associated sxs/prior Treatment) 26 year old female complaining of toothache in the right upper teeth for one week. States she has a history of poor dental repair, multiple cavities and dental fractures.  Second complaint is that she was running yesterday chasing children playing and doing other things and a few hours later she started to develop soreness in her knee. She points to the femoral and tibial condyles as the point of tenderness. No fall or twisting of the knee and no direct trauma.      Past Medical History:  Diagnosis Date  . Asthma   . Chlamydia   . Gonorrhea   . Hx MRSA infection 2005  . NVD (normal vaginal delivery) 08/30/2010  . Trichimoniasis    Past Surgical History:  Procedure Laterality Date  . MULTIPLE TOOTH EXTRACTIONS    . NO PAST SURGERIES     Family History  Problem Relation Age of Onset  . Diabetes Maternal Grandmother   . Hypertension Father   . Heart disease Father   . Lung disease Father   . Anesthesia problems Neg Hx   . Other Neg Hx    Social History  Substance Use Topics  . Smoking status: Current Some Day Smoker    Packs/day: 0.25    Years: 5.00    Types: Cigarettes    Last attempt to quit: 12/27/2012  . Smokeless tobacco: Never Used  . Alcohol use No   OB History    Gravida Para Term Preterm AB Living   SAB TAB Ectopic Multiple Live Births           4     Review of Systems  Constitutional: Negative for activity change, chills and fever.  HENT: Positive for dental problem. Negative for facial swelling.   Respiratory: Negative.   Musculoskeletal:       As per HPI, left knee pain.  Skin: Negative for color change, pallor and rash.  Neurological: Negative.   All other systems reviewed and are  negative.   Allergies  Sulfonamide derivatives  Home Medications   Prior to Admission medications   Medication Sig Start Date End Date Taking? Authorizing Provider  albuterol (PROVENTIL HFA;VENTOLIN HFA) 108 (90 BASE) MCG/ACT inhaler Inhale 2 puffs into the lungs every 4 (four) hours as needed for wheezing or shortness of breath. 03/21/14   Tilden Fossa, MD  albuterol (PROVENTIL) (2.5 MG/3ML) 0.083% nebulizer solution Inhale 3 mLs into the lungs every 6 (six) hours as needed for wheezing or shortness of breath. For wheezing  10/23/14   Historical Provider, MD  amoxicillin (AMOXIL) 500 MG capsule Take 2 capsules (1,000 mg total) by mouth 2 (two) times daily. 06/16/16   Hayden Rasmussen, NP  beclomethasone (QVAR) 40 MCG/ACT inhaler Inhale 1 puff into the lungs 2 (two) times daily. 12/15/14   Leta Baptist, MD  cyclobenzaprine (FLEXERIL) 10 MG tablet Take 1 tablet (10 mg total) by mouth 2 (two) times daily as needed for muscle spasms. 03/24/16   Dorena Bodo, NP  diclofenac (VOLTAREN) 75 MG EC tablet Take 1 tablet (75 mg total) by mouth 2 (two) times daily. 03/24/16   Dorena Bodo, NP  HYDROcodone-acetaminophen (NORCO/VICODIN) 5-325 MG tablet Take 1 tablet by mouth every 4 (four) hours as  needed. 06/16/16   Hayden Rasmussen, NP  ibuprofen (ADVIL,MOTRIN) 100 MG/5ML suspension Take 40 mLs (800 mg total) by mouth every 6 (six) hours as needed for moderate pain (sore throat). 04/21/16   Lyndal Pulley, MD  medroxyPROGESTERone (DEPO-PROVERA) 150 MG/ML injection Inject 1 mL (150 mg total) into the muscle every 3 (three) months. Patient not taking: Reported on 01/04/2016 09/06/13   Carrington Clamp, MD  naproxen (NAPROSYN) 375 MG tablet Take 1 tablet (375 mg total) by mouth 2 (two) times daily. 06/16/16   Hayden Rasmussen, NP   Meds Ordered and Administered this Visit  Medications - No data to display  BP (!) 174/94   Pulse 72   Temp 98.6 F (37 C)   Resp 18   SpO2 98%  No data found.   Physical Exam   Constitutional: She is oriented to person, place, and time. She appears well-developed and well-nourished. No distress.  HENT:  Head: Normocephalic and atraumatic.  Mouth/Throat: Oropharynx is clear and moist. No oropharyngeal exudate.  Poor dental repair. Multiple cavernous teeth. The right upper second molar is denoted below the gumline. The third molar tender, the bicuspid tender. Mild swelling of the surrounding gingiva and tenderness to the involved teeth. No abscess directly seen.  Eyes: EOM are normal.  Neck: Neck supple.  Pulmonary/Chest: Effort normal.  Musculoskeletal: She exhibits tenderness. She exhibits no edema or deformity.  Left knee with no apparent swelling, discoloration or deformity. Tenderness to the medial and lateral femoral and tibial condyles. No joint line tenderness. Flexion and extension is complete. Negative drawer. No laxity appreciated.  Lymphadenopathy:    She has no cervical adenopathy.  Neurological: She is alert and oriented to person, place, and time. No cranial nerve deficit.  Skin: Skin is warm and dry. No rash noted.  Psychiatric: She has a normal mood and affect.  Nursing note and vitals reviewed.   Urgent Care Course     Procedures (including critical care time)  Labs Review Labs Reviewed - No data to display  Imaging Review No results found.   Visual Acuity Review  Right Eye Distance:   Left Eye Distance:   Bilateral Distance:    Right Eye Near:   Left Eye Near:    Bilateral Near:         MDM   1. Acute pain of left knee   2. Tendinitis of left knee   3. Pain, dental   4. Dental caries   5. Dental infection    Apply ice to the areas of pain along her knee. No running, jumping or rapid walking for the next week or so. Follow-up with dentist as soon as possible. Obtain a primary care provider as soon as possible. Make called immediately health and wellness to establish a primary care provider. Meds ordered this encounter   Medications  . amoxicillin (AMOXIL) 500 MG capsule    Sig: Take 2 capsules (1,000 mg total) by mouth 2 (two) times daily.    Dispense:  40 capsule    Refill:  0    Order Specific Question:   Supervising Provider    Answer:   Domenick Gong [[id number]]  . naproxen (NAPROSYN) 375 MG tablet    Sig: Take 1 tablet (375 mg total) by mouth 2 (two) times daily.    Dispense:  20 tablet    Refill:  0    Order Specific Question:   Supervising Provider    Answer:   Domenick Gong [[id number]]  . HYDROcodone-acetaminophen (NORCO/VICODIN)  5-325 MG tablet    Sig: Take 1 tablet by mouth every 4 (four) hours as needed.    Dispense:  12 tablet    Refill:  0    Order Specific Question:   Supervising Provider    Answer:   Domenick Gong [[id number]]       Hayden Rasmussen, NP 06/16/16 1043

## 2017-04-06 ENCOUNTER — Other Ambulatory Visit: Payer: Self-pay

## 2017-04-06 ENCOUNTER — Ambulatory Visit (HOSPITAL_COMMUNITY)
Admission: EM | Admit: 2017-04-06 | Discharge: 2017-04-06 | Disposition: A | Discharge status: Home / Self Care | Payer: Self-pay | Attending: Family Medicine | Admitting: Family Medicine

## 2017-04-06 ENCOUNTER — Encounter (HOSPITAL_COMMUNITY): Payer: Self-pay | Admitting: Emergency Medicine

## 2017-04-06 DIAGNOSIS — J45909 Unspecified asthma, uncomplicated: Secondary | ICD-10-CM | POA: Insufficient documentation

## 2017-04-06 DIAGNOSIS — Z882 Allergy status to sulfonamides status: Secondary | ICD-10-CM | POA: Insufficient documentation

## 2017-04-06 DIAGNOSIS — F17210 Nicotine dependence, cigarettes, uncomplicated: Secondary | ICD-10-CM | POA: Insufficient documentation

## 2017-04-06 DIAGNOSIS — R1012 Left upper quadrant pain: Secondary | ICD-10-CM | POA: Insufficient documentation

## 2017-04-06 LAB — COMPREHENSIVE METABOLIC PANEL
ALBUMIN: 3.2 g/dL — AB (ref 3.5–5.0)
ALK PHOS: 58 U/L (ref 38–126)
ALT: 11 U/L — ABNORMAL LOW (ref 14–54)
ANION GAP: 8 (ref 5–15)
AST: 18 U/L (ref 15–41)
BILIRUBIN TOTAL: 0.7 mg/dL (ref 0.3–1.2)
BUN: 8 mg/dL (ref 6–20)
CALCIUM: 8.4 mg/dL — AB (ref 8.9–10.3)
CO2: 24 mmol/L (ref 22–32)
CREATININE: 0.85 mg/dL (ref 0.44–1.00)
Chloride: 109 mmol/L (ref 101–111)
GFR calc Af Amer: >60 mL/min (ref >60)
GFR calc non Af Amer: >60 mL/min (ref >60)
GLUCOSE: 107 mg/dL — AB (ref 65–99)
Potassium: 3.8 mmol/L (ref 3.5–5.1)
Sodium: 141 mmol/L (ref 135–145)
TOTAL PROTEIN: 6 g/dL — AB (ref 6.5–8.1)

## 2017-04-06 LAB — CBC
HEMATOCRIT: 37.3 % (ref 36.0–46.0)
HEMOGLOBIN: 12.9 g/dL (ref 12.0–15.0)
MCH: 28.6 pg (ref 26.0–34.0)
MCHC: 34.6 g/dL (ref 30.0–36.0)
MCV: 82.7 fL (ref 78.0–100.0)
Platelets: 245 10*3/uL (ref 150–400)
RBC: 4.51 MIL/uL (ref 3.87–5.11)
RDW: 12.9 % (ref 11.5–15.5)
WBC: 6.9 10*3/uL (ref 4.0–10.5)

## 2017-04-06 LAB — POCT URINALYSIS DIP (DEVICE)
BILIRUBIN URINE: NEGATIVE
Glucose, UA: NEGATIVE mg/dL
Hgb urine dipstick: NEGATIVE
KETONES UR: NEGATIVE mg/dL
Leukocytes, UA: NEGATIVE
Nitrite: NEGATIVE
PH: 6 (ref 5.0–8.0)
Protein, ur: NEGATIVE mg/dL
Specific Gravity, Urine: >=1.03 (ref 1.005–1.030)
Urobilinogen, UA: 0.2 mg/dL (ref 0.0–1.0)

## 2017-04-06 LAB — POCT PREGNANCY, URINE: PREG TEST UR: NEGATIVE

## 2017-04-06 MED ORDER — DICLOFENAC SODIUM 75 MG PO TBEC: 75.0000 mg | DELAYED_RELEASE_TABLET | Freq: Two times a day (BID) | ORAL | 0 refills | Status: DC

## 2017-04-06 NOTE — Discharge Instructions (Addendum)
Please return here or to the Emergency Department immediately should you feel worse in any way or have any of the following symptoms: increasing or different abdominal pain, persistent vomiting, fevers, or shaking chills. Please follow for a recheck in 24 hours in order to ensure that you are not developing a problem that might require surgery or hospitalization. ° ° °

## 2017-04-06 NOTE — ED Triage Notes (Signed)
Pt c/o LUQ abdominal pain since last night. Mildly tender to palpation. Denies n/v, states her stool is loose.

## 2017-04-07 NOTE — ED Provider Notes (Signed)
[facility]   [id number] 04/06/17 Arrival Time: 1103  ASSESSMENT & PLAN:  1. Left upper quadrant pain    Question abdominal muscular strain.  Meds ordered this encounter  Medications  . diclofenac (VOLTAREN) 75 MG EC tablet    Sig: Take 1 tablet (75 mg total) by mouth 2 (two) times daily.    Dispense:  14 tablet    Refill:  0   Discussed that I am unsure of the exact etiology of her current symptoms but this does not appear to be anything dangerous at this Abdominal pain precautions given.  She may f/u with her PCP or here if not showing improvement over the next 24-48 hours.  Reviewed expectations re: course of current medical issues. Questions answered. Outlined signs and symptoms indicating need for more acute intervention. Patient verbalized understanding. After Visit Summary given.   SUBJECTIVE: History from: patient.  Brittany [name] is a 27 y.o. female who presents with complaint of intermittent LUQ abdominal discomfort. Onset abrupt, yesterday evening. Abdominal discomfort: mild and colicky. Symptoms are stable since beginning. Aggravating factors: none. Alleviating factors: none. Associated symptoms: "some loose stools but that's all". She denies anorexia, belching, chills, dysuria, fever, nausea, sweats and vomiting. Appetite: normal. PO intake: normal. Ambulatory without assistance. Urinary symptoms: none. Last bowel movement this mornign without blood. OTC treatment: none.  Patient's last menstrual period was 02/17/2017.  Past Surgical History:  Procedure Laterality Date  . MULTIPLE TOOTH EXTRACTIONS    . NO PAST SURGERIES     ROS: As per HPI.  OBJECTIVE:  Vitals:   04/06/17 1132  BP: 133/78  Pulse: 84  Resp: 16  Temp: 98.4 F (36.9 C)  SpO2: 100%    General appearance: alert; no distress Lungs: clear to auscultation bilaterally Heart: regular rate and rhythm Abdomen: soft; non-distended; no significant abdominal tenderness, "just  a cramping feeling" around LUQ; bowel sounds present; no masses or organomegaly; no guarding or rebound tenderness Back: no CVA tenderness Extremities: no edema; symmetrical with no gross deformities Skin: warm and dry Neurologic: normal gait Psychological: alert and cooperative; normal mood and affect  Labs: Results for orders placed or performed during the hospital encounter of 04/06/17  CBC  Result Value Ref Range   WBC 6.9 4.0 - 10.5 K/uL   RBC 4.51 3.87 - 5.11 MIL/uL   Hemoglobin 12.9 12.0 - 15.0 g/dL   HCT 40.937.3 81.136.0 - 91.446.0 %   MCV 82.7 78.0 - 100.0 fL   MCH 28.6 26.0 - 34.0 pg   MCHC 34.6 30.0 - 36.0 g/dL   RDW 78.212.9 95.611.5 - 21.315.5 %   Platelets 245 150 - 400 K/uL  Comprehensive metabolic panel  Result Value Ref Range   Sodium 141 135 - 145 mmol/L   Potassium 3.8 3.5 - 5.1 mmol/L   Chloride 109 101 - 111 mmol/L   CO2 24 22 - 32 mmol/L   Glucose, Bld 107 (H) 65 - 99 mg/dL   BUN 8 6 - 20 mg/dL   Creatinine, Ser 0.860.85 0.44 - 1.00 mg/dL   Calcium 8.4 (L) 8.9 - 10.3 mg/dL   Total Protein 6.0 (L) 6.5 - 8.1 g/dL   Albumin 3.2 (L) 3.5 - 5.0 g/dL   AST 18 15 - 41 U/L   ALT 11 (L) 14 - 54 U/L   Alkaline Phosphatase 58 38 - 126 U/L   Total Bilirubin 0.7 0.3 - 1.2 mg/dL   GFR calc non Af Amer >60 >60 mL/min   GFR  calc Af Amer >60 >60 mL/min   Anion gap 8 5 - 15  POCT urinalysis dip (device)  Result Value Ref Range   Glucose, UA NEGATIVE NEGATIVE mg/dL   Bilirubin Urine NEGATIVE NEGATIVE   Ketones, ur NEGATIVE NEGATIVE mg/dL   Specific Gravity, Urine >=1.030 1.005 - 1.030   Hgb urine dipstick NEGATIVE NEGATIVE   pH 6.0 5.0 - 8.0   Protein, ur NEGATIVE NEGATIVE mg/dL   Urobilinogen, UA 0.2 0.0 - 1.0 mg/dL   Nitrite NEGATIVE NEGATIVE   Leukocytes, UA NEGATIVE NEGATIVE  Pregnancy, urine POC  Result Value Ref Range   Preg Test, Ur NEGATIVE NEGATIVE    Allergies  Allergen Reactions  . Sulfonamide Derivatives Other (See Comments)    blistering                                                Past Medical History:  Diagnosis Date  . Asthma   . Chlamydia   . Gonorrhea   . Hx MRSA infection 2005  . NVD (normal vaginal delivery) 08/30/2010  . Trichimoniasis    Social History   Socioeconomic History  . Marital status: Single    Spouse name: Not on file  . Number of children: Not on file  . Years of education: Not on file  . Highest education level: Not on file  Social Needs  . Financial resource strain: Not on file  . Food insecurity - worry: Not on file  . Food insecurity - inability: Not on file  . Transportation needs - medical: Not on file  . Transportation needs - non-medical: Not on file  Occupational History  . Not on file  Tobacco Use  . Smoking status: Current Some Day Smoker    Packs/day: 0.25    Years: 5.00    Pack years: 1.25    Types: Cigarettes    Last attempt to quit: 12/27/2012    Years since quitting: 4.2  . Smokeless tobacco: Never Used  Substance and Sexual Activity  . Alcohol use: No  . Drug use: No  . Sexual activity: Yes    Birth control/protection: None    Comment: last intercourse 4 days ago  Other Topics Concern  . Not on file  Social History Narrative  . Not on file   Family History  Problem Relation Age of Onset  . Diabetes Maternal Grandmother   . Hypertension Father   . Heart disease Father   . Lung disease Father   . Anesthesia problems Neg Hx   . Other Neg Hx      Mardella Layman, MD 04/07/17 [phone]

## 2017-07-08 ENCOUNTER — Other Ambulatory Visit: Payer: Self-pay

## 2017-07-08 ENCOUNTER — Encounter (HOSPITAL_COMMUNITY): Payer: Self-pay | Admitting: Emergency Medicine

## 2017-07-08 DIAGNOSIS — F17210 Nicotine dependence, cigarettes, uncomplicated: Secondary | ICD-10-CM | POA: Insufficient documentation

## 2017-07-08 DIAGNOSIS — R42 Dizziness and giddiness: Secondary | ICD-10-CM | POA: Insufficient documentation

## 2017-07-08 DIAGNOSIS — R05 Cough: Secondary | ICD-10-CM | POA: Insufficient documentation

## 2017-07-08 DIAGNOSIS — R112 Nausea with vomiting, unspecified: Secondary | ICD-10-CM | POA: Insufficient documentation

## 2017-07-08 DIAGNOSIS — Z79899 Other long term (current) drug therapy: Secondary | ICD-10-CM | POA: Insufficient documentation

## 2017-07-08 DIAGNOSIS — J9801 Acute bronchospasm: Secondary | ICD-10-CM | POA: Insufficient documentation

## 2017-07-08 NOTE — ED Triage Notes (Signed)
Pt states she has had dizziness for the past 4 days and when she gets dizzy she sweats profusely as to where she has to change clothes  States her asthma has been acting up for the past 2 days where she has tightness in her chest and starts coughing then wheezing   Pt is also c/o pelvic pain   Denies discharge  States had vomiting once yesterday

## 2017-07-09 ENCOUNTER — Emergency Department (HOSPITAL_COMMUNITY)
Admission: EM | Admit: 2017-07-09 | Discharge: 2017-07-09 | Disposition: A | Discharge status: Home / Self Care | Payer: Self-pay | Attending: Emergency Medicine | Admitting: Emergency Medicine

## 2017-07-09 DIAGNOSIS — J9801 Acute bronchospasm: Secondary | ICD-10-CM

## 2017-07-09 DIAGNOSIS — R42 Dizziness and giddiness: Secondary | ICD-10-CM

## 2017-07-09 LAB — URINALYSIS, ROUTINE W REFLEX MICROSCOPIC
Bacteria, UA: NONE SEEN
Bilirubin Urine: NEGATIVE
Glucose, UA: NEGATIVE mg/dL
Hgb urine dipstick: NEGATIVE
Ketones, ur: NEGATIVE mg/dL
Leukocytes, UA: NEGATIVE
Nitrite: NEGATIVE
Protein, ur: NEGATIVE mg/dL
Specific Gravity, Urine: 1.02 (ref 1.005–1.030)
pH: 6 (ref 5.0–8.0)

## 2017-07-09 LAB — PREGNANCY, URINE: Preg Test, Ur: NEGATIVE

## 2017-07-09 MED ORDER — ALBUTEROL SULFATE HFA 108 (90 BASE) MCG/ACT IN AERS
2.0000 | INHALATION_SPRAY | RESPIRATORY_TRACT | Status: DC | PRN
  Filled 2017-07-09: qty 6.7

## 2017-07-09 MED ORDER — DEXAMETHASONE SODIUM PHOSPHATE 10 MG/ML IJ SOLN
10.0000 mg | Freq: Once | INTRAMUSCULAR | Status: AC
  Administered 2017-07-09: 10 mg via INTRAVENOUS
  Filled 2017-07-09: qty 1

## 2017-07-09 MED ORDER — IPRATROPIUM-ALBUTEROL 0.5-2.5 (3) MG/3ML IN SOLN
3.0000 mL | RESPIRATORY_TRACT | Status: DC
  Administered 2017-07-09: 3 mL via RESPIRATORY_TRACT
  Filled 2017-07-09: qty 3

## 2017-07-09 MED ORDER — SODIUM CHLORIDE 0.9 % IV BOLUS
1000.0000 mL | Freq: Once | INTRAVENOUS | Status: AC
Start: 2017-07-09 — End: 2017-07-09
  Administered 2017-07-09: 1000 mL via INTRAVENOUS

## 2017-07-09 MED ORDER — KETOROLAC TROMETHAMINE 15 MG/ML IJ SOLN
15.0000 mg | Freq: Once | INTRAMUSCULAR | Status: AC
  Administered 2017-07-09: 15 mg via INTRAVENOUS
  Filled 2017-07-09: qty 1

## 2017-07-09 MED ORDER — ONDANSETRON HCL 4 MG/2ML IJ SOLN
4.0000 mg | Freq: Once | INTRAMUSCULAR | Status: AC
  Administered 2017-07-09: 4 mg via INTRAVENOUS
  Filled 2017-07-09: qty 2

## 2017-07-09 MED ORDER — ONDANSETRON 8 MG PO TBDP: 8.0000 mg | ORAL_TABLET | Freq: Three times a day (TID) | ORAL | 0 refills | Status: DC | PRN

## 2017-07-09 NOTE — ED Provider Notes (Signed)
[facility]-EMERGENCY DEPT Provider Note: Lowella Dell, MD, FACEP  CSN: [id number] MRN: [id number] ARRIVAL: 07/08/17 at 2246 ROOM: WA11/WA11   CHIEF COMPLAINT  Dizziness   HISTORY OF PRESENT ILLNESS  07/09/17 3:02 AM Brittany [name] is a 27 y.o. female who complains of a 4-day history of dizziness by which she means lightheadedness.  This is worse when standing.  When she gets lightheaded she has nausea and vomiting and has vomited 4 times in the past 4 days.  She has also had an exacerbation of her asthma and has used her inhaler multiple times over the past 4 days.  She feels short of breath at the present time and is requesting a neb treatment.  She has been coughing excessively and when she coughs she feels a mild pain in her lower abdomen.  She denies dysuria, vaginal bleeding or vaginal discharge.  She is not aware of having a fever.   Past Medical History:  Diagnosis Date  . Asthma   . Chlamydia   . Gonorrhea   . Hx MRSA infection 2005  . NVD (normal vaginal delivery) 08/30/2010  . Trichimoniasis     Past Surgical History:  Procedure Laterality Date  . MULTIPLE TOOTH EXTRACTIONS    . NO PAST SURGERIES      Family History  Problem Relation Age of Onset  . Diabetes Maternal Grandmother   . Hypertension Father   . Heart disease Father   . Lung disease Father   . Anesthesia problems Neg Hx   . Other Neg Hx     Social History   Tobacco Use  . Smoking status: Current Some Day Smoker    Packs/day: 0.25    Years: 5.00    Pack years: 1.25    Types: Cigarettes    Last attempt to quit: 12/27/2012    Years since quitting: 4.5  . Smokeless tobacco: Never Used  Substance Use Topics  . Alcohol use: No  . Drug use: No    Prior to Admission medications   Medication Sig Start Date End Date Taking? Authorizing Provider  albuterol (PROVENTIL HFA;VENTOLIN HFA) 108 (90 BASE) MCG/ACT inhaler Inhale 2 puffs into the lungs every 4 (four) hours as needed for wheezing or  shortness of breath. 03/21/14   Tilden Fossa, MD  albuterol (PROVENTIL) (2.5 MG/3ML) 0.083% nebulizer solution Inhale 3 mLs into the lungs every 6 (six) hours as needed for wheezing or shortness of breath. For wheezing  10/23/14   [provider]  amoxicillin (AMOXIL) 500 MG capsule Take 2 capsules (1,000 mg total) by mouth 2 (two) times daily. Patient not taking: Reported on 04/06/2017 06/16/16   Hayden Rasmussen, NP  beclomethasone (QVAR) 40 MCG/ACT inhaler Inhale 1 puff into the lungs 2 (two) times daily. 12/15/14   Leta Baptist, MD  cyclobenzaprine (FLEXERIL) 10 MG tablet Take 1 tablet (10 mg total) by mouth 2 (two) times daily as needed for muscle spasms. Patient not taking: Reported on 04/06/2017 03/24/16   Dorena Bodo, NP  diclofenac (VOLTAREN) 75 MG EC tablet Take 1 tablet (75 mg total) by mouth 2 (two) times daily. 04/06/17   Mardella Layman, MD  HYDROcodone-acetaminophen (NORCO/VICODIN) 5-325 MG tablet Take 1 tablet by mouth every 4 (four) hours as needed. Patient not taking: Reported on 04/06/2017 06/16/16   Hayden Rasmussen, NP  ibuprofen (ADVIL,MOTRIN) 100 MG/5ML suspension Take 40 mLs (800 mg total) by mouth every 6 (six) hours as needed for moderate pain (sore throat). Patient not taking: Reported on  04/06/2017 04/21/16   Lyndal Pulley, MD  medroxyPROGESTERone (DEPO-PROVERA) 150 MG/ML injection Inject 1 mL (150 mg total) into the muscle every 3 (three) months. Patient not taking: Reported on 01/04/2016 09/06/13   Carrington Clamp, MD  naproxen (NAPROSYN) 375 MG tablet Take 1 tablet (375 mg total) by mouth 2 (two) times daily. Patient not taking: Reported on 04/06/2017 06/16/16   Hayden Rasmussen, NP    Allergies Sulfonamide derivatives   REVIEW OF SYSTEMS  Negative except as noted here or in the History of Present Illness.   PHYSICAL EXAMINATION  Initial Vital Signs Blood pressure 123/86, pulse 83, temperature 98.3 F (36.8 C), temperature source Oral, resp. rate 16, SpO2 99  %.  Examination General: Well-developed, well-nourished female in no acute distress; appearance consistent with age of record HENT: normocephalic; atraumatic Eyes: pupils equal, round and reactive to light; extraocular muscles intact Neck: supple Heart: regular rate and rhythm Lungs: Raspy cough; decreased air movement bilaterally Abdomen: soft; nondistended; mild suprapubic tenderness; no masses or hepatosplenomegaly; bowel sounds present Extremities: No deformity; full range of motion; pulses normal Neurologic: Awake, alert and oriented; motor function intact in all extremities and symmetric; no facial droop Skin: Warm and dry Psychiatric: Normal mood and affect   RESULTS  Summary of this visit's results, reviewed by myself:   EKG Interpretation  Date/Time:    Ventricular Rate:    PR Interval:    QRS Duration:   QT Interval:    QTC Calculation:   R Axis:     Text Interpretation:        Laboratory Studies: Results for orders placed or performed during the hospital encounter of 07/09/17 (from the past 24 hour(s))  Urinalysis, Routine w reflex microscopic     Status: Abnormal   Collection Time: 07/09/17  3:08 AM  Result Value Ref Range   Color, Urine YELLOW (A) YELLOW   APPearance CLEAR (A) CLEAR   Specific Gravity, Urine 1.020 1.005 - 1.030   pH 6.0 5.0 - 8.0   Glucose, UA NEGATIVE NEGATIVE mg/dL   Hgb urine dipstick NEGATIVE NEGATIVE   Bilirubin Urine NEGATIVE NEGATIVE   Ketones, ur NEGATIVE NEGATIVE mg/dL   Protein, ur NEGATIVE NEGATIVE mg/dL   Nitrite NEGATIVE NEGATIVE   Leukocytes, UA NEGATIVE NEGATIVE   WBC, UA 0-5 0 - 5 WBC/hpf   Bacteria, UA NONE SEEN NONE SEEN   Squamous Epithelial / LPF 0-5 0 - 5   Mucus PRESENT   Pregnancy, urine     Status: None   Collection Time: 07/09/17  3:08 AM  Result Value Ref Range   Preg Test, Ur NEGATIVE NEGATIVE   Imaging Studies: No results found.  ED COURSE and MDM  Nursing notes and initial vitals signs, including  pulse oximetry, reviewed.  Vitals:   07/08/17 2309 07/09/17 0252 07/09/17 0354  BP: 126/88 123/86   Pulse: 86 83   Resp: 20 16   Temp: 98.3 F (36.8 C)    TempSrc: Oral    SpO2: 99% 99% 98%   4:43 AM Patient given IV fluid bolus.  Her urinalysis does not show significant dehydration.  The cause of her lightheadedness may be due to decreased oral intake recently, especially food.  Air movement is improved after DuoNeb treatment but she has wheezing now.  She is requesting a new inhaler as she believes her home inhaler is not working properly.  We will also give a dose of dexamethasone for her persistent bronchospasm.   PROCEDURES    ED DIAGNOSES  ICD-10-CM   1. Lightheadedness R42   2. Acute bronchospasm J98.01        Daxton Nydam, Jonny Ruiz, MD 07/09/17 [phone]

## 2017-08-24 ENCOUNTER — Encounter (HOSPITAL_COMMUNITY): Payer: Self-pay | Admitting: *Deleted

## 2017-08-24 ENCOUNTER — Other Ambulatory Visit: Payer: Self-pay

## 2017-08-24 ENCOUNTER — Emergency Department (HOSPITAL_COMMUNITY)
Admission: EM | Admit: 2017-08-24 | Discharge: 2017-08-24 | Disposition: A | Discharge status: Home / Self Care | Payer: Self-pay | Attending: Emergency Medicine | Admitting: Emergency Medicine

## 2017-08-24 DIAGNOSIS — Z79899 Other long term (current) drug therapy: Secondary | ICD-10-CM | POA: Insufficient documentation

## 2017-08-24 DIAGNOSIS — F17210 Nicotine dependence, cigarettes, uncomplicated: Secondary | ICD-10-CM | POA: Insufficient documentation

## 2017-08-24 DIAGNOSIS — J4521 Mild intermittent asthma with (acute) exacerbation: Secondary | ICD-10-CM | POA: Insufficient documentation

## 2017-08-24 DIAGNOSIS — J209 Acute bronchitis, unspecified: Secondary | ICD-10-CM | POA: Insufficient documentation

## 2017-08-24 MED ORDER — PSEUDOEPHEDRINE HCL ER 120 MG PO TB12: 120.0000 mg | ORAL_TABLET | Freq: Two times a day (BID) | ORAL | 0 refills | Status: DC

## 2017-08-24 MED ORDER — AEROCHAMBER PLUS FLO-VU MEDIUM MISC
1.0000 | Freq: Once | Status: AC
  Administered 2017-08-24: 1
  Filled 2017-08-24: qty 1

## 2017-08-24 MED ORDER — ACETAMINOPHEN 500 MG PO TABS: 1000.0000 mg | ORAL_TABLET | Freq: Four times a day (QID) | ORAL | 0 refills | Status: DC | PRN

## 2017-08-24 MED ORDER — ALBUTEROL SULFATE HFA 108 (90 BASE) MCG/ACT IN AERS
2.0000 | INHALATION_SPRAY | RESPIRATORY_TRACT | Status: DC
  Administered 2017-08-24: 2 via RESPIRATORY_TRACT
  Filled 2017-08-24: qty 6.7

## 2017-08-24 MED ORDER — IPRATROPIUM-ALBUTEROL 0.5-2.5 (3) MG/3ML IN SOLN
3.0000 mL | Freq: Once | RESPIRATORY_TRACT | Status: AC
  Administered 2017-08-24: 3 mL via RESPIRATORY_TRACT
  Filled 2017-08-24: qty 3

## 2017-08-24 MED ORDER — ALBUTEROL SULFATE (2.5 MG/3ML) 0.083% IN NEBU
5.0000 mg | INHALATION_SOLUTION | Freq: Once | RESPIRATORY_TRACT | Status: AC
  Administered 2017-08-24: 5 mg via RESPIRATORY_TRACT
  Filled 2017-08-24: qty 6

## 2017-08-24 NOTE — ED Notes (Signed)
Bed: WA05 Expected date:  Expected time:  Means of arrival:  Comments: 

## 2017-08-24 NOTE — ED Provider Notes (Signed)
[facility]-EMERGENCY DEPT Provider Note   CSN: [id number] Arrival date & time: 08/24/17  [id number]     History   Chief Complaint Chief Complaint  Patient presents with  . Cough    HPI Brittany [name] is a 27 y.o. female.  HPI Has history of asthma.  She reports she was at work last night she started to get chills and coughing.  Did not have any more of her inhaler.  She reports that she started to feel short of breath and like her chest was tight.  Symptoms all started last night.  She reports also she felt nasal congestion and scratchy throat. Past Medical History:  Diagnosis Date  . Asthma   . Chlamydia   . Gonorrhea   . Hx MRSA infection 2005  . NVD (normal vaginal delivery) 08/30/2010  . Trichimoniasis     Patient Active Problem List   Diagnosis Date Noted  . Status post normal vaginal delivery 09/05/2013  . Other specified indication for care or intervention related to labor and delivery, unspecified as to episode of care 09/03/2013  . Elevated blood pressure complicating pregnancy, antepartum 12/01/2011    Past Surgical History:  Procedure Laterality Date  . MULTIPLE TOOTH EXTRACTIONS    . NO PAST SURGERIES       OB History    Gravida  4   Para  4   Term  4   Preterm      AB      Living  4     SAB      TAB      Ectopic      Multiple      Live Births  4            Home Medications    Prior to Admission medications   Medication Sig Start Date End Date Taking? Authorizing Provider  albuterol (PROVENTIL HFA;VENTOLIN HFA) 108 (90 Base) MCG/ACT inhaler Inhale 1-2 puffs into the lungs every 6 (six) hours as needed for wheezing or shortness of breath.   Yes [provider]  acetaminophen (TYLENOL) 500 MG tablet Take 2 tablets (1,000 mg total) by mouth every 6 (six) hours as needed. 08/24/17   Arby BarrettePfeiffer, Lavarr President, MD  diclofenac (VOLTAREN) 75 MG EC tablet Take 1 tablet (75 mg total) by mouth 2 (two) times  daily. Patient not taking: Reported on 08/24/2017 04/06/17   Mardella LaymanHagler, Brian, MD  ondansetron (ZOFRAN ODT) 8 MG disintegrating tablet Take 1 tablet (8 mg total) by mouth every 8 (eight) hours as needed for nausea or vomiting. Patient not taking: Reported on 08/24/2017 07/09/17   Molpus, Jonny RuizJohn, MD  pseudoephedrine (SUDAFED 12 HOUR) 120 MG 12 hr tablet Take 1 tablet (120 mg total) by mouth 2 (two) times daily. 08/24/17   Arby BarrettePfeiffer, Arin Vanosdol, MD    Family History Family History  Problem Relation Age of Onset  . Diabetes Maternal Grandmother   . Hypertension Father   . Heart disease Father   . Lung disease Father   . Anesthesia problems Neg Hx   . Other Neg Hx     Social History Social History   Tobacco Use  . Smoking status: Current Some Day Smoker    Packs/day: 0.25    Years: 5.00    Pack years: 1.25    Types: Cigarettes    Last attempt to quit: 12/27/2012    Years since quitting: 4.6  . Smokeless tobacco: Never Used  Substance Use Topics  . Alcohol use:  No  . Drug use: No     Allergies   Iodine and Sulfonamide derivatives   Review of Systems Review of Systems 10 Systems reviewed and are negative for acute change except as noted in the HPI.  Physical Exam Updated Vital Signs BP (!) 140/91   Pulse 91   Temp 98.8 F (37.1 C) (Oral)   Resp 20   SpO2 96%   Physical Exam  Constitutional: She is oriented to person, place, and time. She appears well-developed and well-nourished.  HENT:  Head: Normocephalic and atraumatic.  Nose: Nose normal.  Mouth/Throat: Oropharynx is clear and moist.  Bilateral TMs normal.  Eyes: Pupils are equal, round, and reactive to light. EOM are normal.  Neck: Neck supple.  Cardiovascular: Normal rate, regular rhythm, normal heart sounds and intact distal pulses.  Pulmonary/Chest: Effort normal.  Diminished breath sounds to mid lung fields with slight expiratory wheeze.  No respiratory distress at rest.  Abdominal: Soft. Bowel sounds are normal.  She exhibits no distension. There is no tenderness. There is no guarding.  Musculoskeletal: Normal range of motion. She exhibits no edema or tenderness.  Neurological: She is alert and oriented to person, place, and time. She has normal strength. She exhibits normal muscle tone. Coordination normal. GCS eye subscore is 4. GCS verbal subscore is 5. GCS motor subscore is 6.  Skin: Skin is warm, dry and intact.  Psychiatric: She has a normal mood and affect.     ED Treatments / Results  Labs (all labs ordered are listed, but only abnormal results are displayed) Labs Reviewed - No data to display  EKG None  Radiology No results found.  Procedures Procedures (including critical care time)  Medications Ordered in ED Medications  albuterol (PROVENTIL HFA;VENTOLIN HFA) 108 (90 Base) MCG/ACT inhaler 2 puff (2 puffs Inhalation Given 08/24/17 0941)  albuterol (PROVENTIL) (2.5 MG/3ML) 0.083% nebulizer solution 5 mg (5 mg Nebulization Given 08/24/17 0710)  ipratropium-albuterol (DUONEB) 0.5-2.5 (3) MG/3ML nebulizer solution 3 mL (3 mLs Nebulization Given 08/24/17 0810)  AEROCHAMBER PLUS FLO-VU MEDIUM MISC 1 each (1 each Other Given 08/24/17 0941)     Initial Impression / Assessment and Plan / ED Course  I have reviewed the triage vital signs and the nursing notes.  Pertinent labs & imaging results that were available during my care of the patient were reviewed by me and considered in my medical decision making (see chart for details).    Recheck after second nebulizer therapy: Patient is resting quietly with no distress.  Much better airflow with forced inspiratory and expiratory exhalation.  Flow to the bases with wheeze nearly resolved.  Final Clinical Impressions(s) / ED Diagnoses   Final diagnoses:  Acute bronchitis, unspecified organism  Mild intermittent asthma with exacerbation   Patient presents with asthma exacerbation.  She also describes URI symptoms.  Symptoms started last  night.  as I entered the room for recheck after 2nd nebulizer therapy, patient had a companion in the room whom I had just also seen as a patient for URI type symptoms.  Most likely patient has viral URI with asthma exacerbation.  She is clinically well in appearance.  Exam is normal except wheezing which resolved with 2 treatments.  At this time, patient stable for discharge.  She is given instructions on using albuterol for the next 2 days then as needed.  Return precautions reviewed. ED Discharge Orders        Ordered    acetaminophen (TYLENOL) 500 MG tablet  Every 6 hours PRN     08/24/17 0936    pseudoephedrine (SUDAFED 12 HOUR) 120 MG 12 hr tablet  2 times daily     08/24/17 0936       Arby Barrette, MD 08/24/17 [phone]

## 2017-08-24 NOTE — ED Triage Notes (Signed)
Pt says that she was at work and has had a cough, when she felt like her chest was getting tight and felt SOB. She is out of her inhaler. She is also having right ear pain.

## 2017-08-24 NOTE — Discharge Instructions (Addendum)
1.  You should have a recheck with your family doctor within 3 to 5 days.  If you do not have a family doctor, use the referral number in your discharge instructions to find one.  It is very important that people with asthma have regular medical care and routine checkups for asthma maintenance. 2.  Return to the emergency department if your symptoms are worsening. 3.  Take Sudafed for the next several days for congestion, take acetaminophen every 4-6 hours for aches or fever.  Use your inhaler every 4 hours for the next 2 days then as needed.

## 2017-09-20 ENCOUNTER — Inpatient Hospital Stay (HOSPITAL_COMMUNITY)
Admission: AD | Admit: 2017-09-20 | Discharge: 2017-09-21 | Disposition: A | Discharge status: Home / Self Care | Payer: Self-pay | Source: Ambulatory Visit | Attending: Family Medicine | Admitting: Family Medicine

## 2017-09-20 ENCOUNTER — Encounter (HOSPITAL_COMMUNITY): Payer: Self-pay | Admitting: *Deleted

## 2017-09-20 DIAGNOSIS — R102 Pelvic and perineal pain: Secondary | ICD-10-CM | POA: Insufficient documentation

## 2017-09-20 DIAGNOSIS — R1030 Lower abdominal pain, unspecified: Secondary | ICD-10-CM | POA: Insufficient documentation

## 2017-09-20 LAB — URINALYSIS, ROUTINE W REFLEX MICROSCOPIC
Bilirubin Urine: NEGATIVE
GLUCOSE, UA: NEGATIVE mg/dL
HGB URINE DIPSTICK: NEGATIVE
Ketones, ur: NEGATIVE mg/dL
Leukocytes, UA: NEGATIVE
Nitrite: NEGATIVE
PH: 5 (ref 5.0–8.0)
Protein, ur: NEGATIVE mg/dL
SPECIFIC GRAVITY, URINE: 1.023 (ref 1.005–1.030)

## 2017-09-20 LAB — POCT PREGNANCY, URINE: PREG TEST UR: NEGATIVE

## 2017-09-20 NOTE — MAU Note (Signed)
PT SAYS  HAS LOWER  ABD PAIN- STARTED AT 2 PM-  TOOK  800MG  IBUPROFEN - NO RELIEF.  NO BIRTH CONTROL. LAST SEX- YESTERDAY- HURTS.   NO PROBLEMS TO VOID.   NO HPT.

## 2017-09-28 ENCOUNTER — Other Ambulatory Visit: Payer: Self-pay

## 2017-09-28 ENCOUNTER — Emergency Department (HOSPITAL_COMMUNITY)
Admission: EM | Admit: 2017-09-28 | Discharge: 2017-09-28 | Disposition: A | Discharge status: Home / Self Care | Payer: Self-pay | Attending: Emergency Medicine | Admitting: Emergency Medicine

## 2017-09-28 DIAGNOSIS — F17210 Nicotine dependence, cigarettes, uncomplicated: Secondary | ICD-10-CM | POA: Insufficient documentation

## 2017-09-28 DIAGNOSIS — K029 Dental caries, unspecified: Secondary | ICD-10-CM | POA: Insufficient documentation

## 2017-09-28 DIAGNOSIS — K0889 Other specified disorders of teeth and supporting structures: Secondary | ICD-10-CM | POA: Insufficient documentation

## 2017-09-28 MED ORDER — OXYCODONE-ACETAMINOPHEN 5-325 MG PO TABS
2.0000 | ORAL_TABLET | Freq: Once | ORAL | Status: AC
  Administered 2017-09-28: 2 via ORAL
  Filled 2017-09-28: qty 2

## 2017-09-28 MED ORDER — PENICILLIN V POTASSIUM 500 MG PO TABS
500.0000 mg | ORAL_TABLET | Freq: Once | ORAL | Status: AC
  Administered 2017-09-28: 500 mg via ORAL
  Filled 2017-09-28: qty 1

## 2017-09-28 MED ORDER — PENICILLIN V POTASSIUM 500 MG PO TABS: 500.0000 mg | ORAL_TABLET | Freq: Four times a day (QID) | ORAL | 0 refills | Status: AC

## 2017-09-28 NOTE — Discharge Instructions (Signed)
Take Penicillin as prescribed until finished. Continue 600mg  ibuprofen every 6 hours for pain control. Follow up with a dentist.

## 2017-09-28 NOTE — ED Provider Notes (Signed)
[facility]-EMERGENCY DEPT Provider Note   CSN: [id number] Arrival date & time: 09/28/17  0346     History   Chief Complaint Chief Complaint  Patient presents with  . Dental Pain    HPI Brittany [name] is a 27 y.o. female.  The history is provided by the patient. No language interpreter was used.  Dental Pain   This is a new problem. The current episode started yesterday. The problem occurs constantly. The problem has been gradually worsening. The pain is at a severity of 10/10. The pain is moderate. Treatments tried: NSAIDs. The treatment provided no relief.    Past Medical History:  Diagnosis Date  . Asthma   . Chlamydia   . Gonorrhea   . Hx MRSA infection 2005  . NVD (normal vaginal delivery) 08/30/2010  . Trichimoniasis     Patient Active Problem List   Diagnosis Date Noted  . Status post normal vaginal delivery 09/05/2013  . Other specified indication for care or intervention related to labor and delivery, unspecified as to episode of care 09/03/2013  . Elevated blood pressure complicating pregnancy, antepartum 12/01/2011    Past Surgical History:  Procedure Laterality Date  . MULTIPLE TOOTH EXTRACTIONS    . NO PAST SURGERIES       OB History    Gravida  4   Para  4   Term  4   Preterm      AB      Living  4     SAB      TAB      Ectopic      Multiple      Live Births  4            Home Medications    Prior to Admission medications   Medication Sig Start Date End Date Taking? Authorizing Provider  acetaminophen (TYLENOL) 500 MG tablet Take 2 tablets (1,000 mg total) by mouth every 6 (six) hours as needed. 08/24/17   Arby BarrettePfeiffer, Marcy, MD  albuterol (PROVENTIL HFA;VENTOLIN HFA) 108 (90 Base) MCG/ACT inhaler Inhale 1-2 puffs into the lungs every 6 (six) hours as needed for wheezing or shortness of breath.    [provider]  diclofenac (VOLTAREN) 75 MG EC tablet Take 1 tablet (75 mg total) by mouth 2  (two) times daily. Patient not taking: Reported on 08/24/2017 04/06/17   Mardella LaymanHagler, Brian, MD  ondansetron (ZOFRAN ODT) 8 MG disintegrating tablet Take 1 tablet (8 mg total) by mouth every 8 (eight) hours as needed for nausea or vomiting. Patient not taking: Reported on 08/24/2017 07/09/17   Molpus, Jonny RuizJohn, MD  penicillin v potassium (VEETID) 500 MG tablet Take 1 tablet (500 mg total) by mouth 4 (four) times daily for 7 days. 09/28/17 10/05/17  Antony MaduraHumes, Liberty Stead, PA-C  pseudoephedrine (SUDAFED 12 HOUR) 120 MG 12 hr tablet Take 1 tablet (120 mg total) by mouth 2 (two) times daily. 08/24/17   Arby BarrettePfeiffer, Marcy, MD    Family History Family History  Problem Relation Age of Onset  . Diabetes Maternal Grandmother   . Hypertension Father   . Heart disease Father   . Lung disease Father   . Anesthesia problems Neg Hx   . Other Neg Hx     Social History Social History   Tobacco Use  . Smoking status: Current Some Day Smoker    Packs/day: 0.25    Years: 5.00    Pack years: 1.25    Types: Cigarettes  Last attempt to quit: 12/27/2012    Years since quitting: 4.7  . Smokeless tobacco: Never Used  Substance Use Topics  . Alcohol use: No  . Drug use: No     Allergies   Iodine and Sulfonamide derivatives   Review of Systems Review of Systems Ten systems reviewed and are negative for acute change, except as noted in the HPI.    Physical Exam Updated Vital Signs BP (!) 160/88 (BP Location: Left Arm)   Pulse 74   Temp 98.5 F (36.9 C) (Oral)   Resp 14   SpO2 100%   Physical Exam  Constitutional: She is oriented to person, place, and time. She appears well-developed and well-nourished. No distress.  Nontoxic appearing and in NAD  HENT:  Head: Normocephalic and atraumatic.  Mouth/Throat:    No gingival fluctuance or purulent drainage in the mouth. Symmetric jaw opening. No trismus. Tolerating secretions without difficulty.  Eyes: Conjunctivae and EOM are normal. No scleral icterus.  Neck:  Normal range of motion.  No nuchal rigidity or meningismus  Pulmonary/Chest: Effort normal. No respiratory distress.  Respirations even and unlabored  Musculoskeletal: Normal range of motion.  Neurological: She is alert and oriented to person, place, and time. She exhibits normal muscle tone. Coordination normal.  Skin: Skin is warm and dry. No rash noted. She is not diaphoretic. No erythema. No pallor.  Psychiatric: She has a normal mood and affect. Her behavior is normal.  Nursing note and vitals reviewed.    ED Treatments / Results  Labs (all labs ordered are listed, but only abnormal results are displayed) Labs Reviewed - No data to display  EKG None  Radiology No results found.  Procedures Procedures (including critical care time)  Medications Ordered in ED Medications  oxyCODONE-acetaminophen (PERCOCET/ROXICET) 5-325 MG per tablet 2 tablet (has no administration in time range)  penicillin v potassium (VEETID) tablet 500 mg (has no administration in time range)     Initial Impression / Assessment and Plan / ED Course  I have reviewed the triage vital signs and the nursing notes.  Pertinent labs & imaging results that were available during my care of the patient were reviewed by me and considered in my medical decision making (see chart for details).     Patient with toothache.  No gross abscess.  Exam unconcerning for Ludwig's angina or spread of infection.  Will treat with penicillin and pain medicine.  Urged patient to follow-up with dentist.  Return precautions discussed and provided. Patient discharged in stable condition with no unaddressed concerns.   Final Clinical Impressions(s) / ED Diagnoses   Final diagnoses:  Toothache    ED Discharge Orders         Ordered    penicillin v potassium (VEETID) 500 MG tablet  4 times daily     09/28/17 0441           Antony MaduraHumes, Izabela Ow, PA-C 09/28/17 0448    Palumbo, April, MD 09/28/17 [phone]

## 2017-09-28 NOTE — ED Triage Notes (Signed)
Pt arriving with pain in the top right back tooth. Pt reports feeling as if she may have an abscess forming

## 2018-01-02 ENCOUNTER — Emergency Department (HOSPITAL_COMMUNITY)
Admission: EM | Admit: 2018-01-02 | Discharge: 2018-01-02 | Disposition: A | Discharge status: Home / Self Care | Payer: Self-pay | Attending: Emergency Medicine | Admitting: Emergency Medicine

## 2018-01-02 ENCOUNTER — Encounter (HOSPITAL_COMMUNITY): Payer: Self-pay

## 2018-01-02 DIAGNOSIS — R0981 Nasal congestion: Secondary | ICD-10-CM | POA: Insufficient documentation

## 2018-01-02 DIAGNOSIS — F17210 Nicotine dependence, cigarettes, uncomplicated: Secondary | ICD-10-CM | POA: Insufficient documentation

## 2018-01-02 DIAGNOSIS — J069 Acute upper respiratory infection, unspecified: Secondary | ICD-10-CM | POA: Insufficient documentation

## 2018-01-02 DIAGNOSIS — Z79899 Other long term (current) drug therapy: Secondary | ICD-10-CM | POA: Insufficient documentation

## 2018-01-02 DIAGNOSIS — J9801 Acute bronchospasm: Secondary | ICD-10-CM | POA: Insufficient documentation

## 2018-01-02 DIAGNOSIS — R0789 Other chest pain: Secondary | ICD-10-CM | POA: Insufficient documentation

## 2018-01-02 MED ORDER — ALBUTEROL SULFATE (2.5 MG/3ML) 0.083% IN NEBU
5.0000 mg | INHALATION_SOLUTION | Freq: Once | RESPIRATORY_TRACT | Status: AC
  Administered 2018-01-02: 5 mg via RESPIRATORY_TRACT
  Filled 2018-01-02: qty 6

## 2018-01-02 MED ORDER — IPRATROPIUM BROMIDE 0.02 % IN SOLN
0.5000 mg | Freq: Once | RESPIRATORY_TRACT | Status: AC
  Administered 2018-01-02: 0.5 mg via RESPIRATORY_TRACT
  Filled 2018-01-02: qty 2.5

## 2018-01-02 MED ORDER — ACETAMINOPHEN 325 MG PO TABS
650.0000 mg | ORAL_TABLET | Freq: Once | ORAL | Status: AC
  Administered 2018-01-02: 650 mg via ORAL
  Filled 2018-01-02: qty 2

## 2018-01-02 MED ORDER — PREDNISONE 20 MG PO TABS
60.0000 mg | ORAL_TABLET | Freq: Once | ORAL | Status: AC
  Administered 2018-01-02: 60 mg via ORAL
  Filled 2018-01-02: qty 3

## 2018-01-02 MED ORDER — PREDNISONE 20 MG PO TABS: 40.0000 mg | ORAL_TABLET | Freq: Every day | ORAL | 0 refills | Status: DC

## 2018-01-02 NOTE — ED Triage Notes (Signed)
Pt arrived from home with complaints of increased shortness of breath over the last two days. Pt attempted an albuterol treatment with no relief. Wheezing noted in all fields noted. Pt on duoneb.

## 2018-01-02 NOTE — Discharge Instructions (Signed)
Please read and follow all provided instructions.  Your diagnoses today include:  1. Bronchospasm   2. Upper respiratory tract infection, unspecified type     Tests performed today include:  Vital signs. See below for your results today.   Medications prescribed:   Prednisone - steroid medicine   It is best to take this medication in the morning to prevent sleeping problems. If you are diabetic, monitor your blood sugar closely and stop taking Prednisone if blood sugar is over 300. Take with food to prevent stomach upset.   Take any prescribed medications only as directed.  Home care instructions:  Follow any educational materials contained in this packet.  Follow-up instructions: Please follow-up with your primary care provider in the next 3 days for further evaluation of your symptoms and management of your asthma.  Return instructions:   Please return to the Emergency Department if you experience worsening symptoms.  Please return with worsening wheezing, shortness of breath, or difficulty breathing.  Return with persistent fever above 101F.   Please return if you have any other emergent concerns.  Additional Information:  Your vital signs today were: BP 121/76    Pulse 99    Temp (!) 97.5 F (36.4 C) (Axillary)    Resp 20    Ht 5\' 5"  (1.651 m)    Wt 99.8 kg    SpO2 96%    BMI 36.61 kg/m  If your blood pressure (BP) was elevated above 135/85 this visit, please have this repeated by your doctor within one month. --------------

## 2018-01-02 NOTE — ED Notes (Signed)
Bed: WA17 Expected date:  Expected time:  Means of arrival:  Comments: 27 yo F/ shortness of breath

## 2018-01-02 NOTE — ED Provider Notes (Addendum)
[facility]-EMERGENCY DEPT Provider Note   CSN: [id number] Arrival date & time: 01/02/18  7564     History   Chief Complaint Chief Complaint  Patient presents with  . Shortness of Breath    HPI Brittany [name] is a 27 y.o. female.  Patient with history of asthma presents with complaint of wheezing and shortness of breath.  Patient states that her breathing has been worse over the past 2 days.  She has had associated nasal congestion, chest tightness.  She states that her 18-year-old child at home has been sick with cold-like symptoms recently.  Patient has an albuterol inhaler which she has been using without improvement.  No fevers or chest pain.  She denies any nausea, vomiting, or diarrhea.  States this feels like a typical asthma attack for her.  Patient denies risk factors for pulmonary embolism including: unilateral leg swelling, history of DVT/PE/other blood clots, use of exogenous hormones, recent immobilizations, recent surgery, recent travel (>4hr segment), malignancy, hemoptysis. The onset of this condition was acute. The course is constant. Aggravating factors: none. Alleviating factors: none.       Past Medical History:  Diagnosis Date  . Asthma   . Chlamydia   . Gonorrhea   . Hx MRSA infection 2005  . NVD (normal vaginal delivery) 08/30/2010  . Trichimoniasis     Patient Active Problem List   Diagnosis Date Noted  . Status post normal vaginal delivery 09/05/2013  . Other specified indication for care or intervention related to labor and delivery, unspecified as to episode of care 09/03/2013  . Elevated blood pressure complicating pregnancy, antepartum 12/01/2011    Past Surgical History:  Procedure Laterality Date  . MULTIPLE TOOTH EXTRACTIONS    . NO PAST SURGERIES       OB History    Gravida  4   Para  4   Term  4   Preterm      AB      Living  4     SAB      TAB      Ectopic      Multiple      Live Births    4            Home Medications    Prior to Admission medications   Medication Sig Start Date End Date Taking? Authorizing Provider  acetaminophen (TYLENOL) 500 MG tablet Take 2 tablets (1,000 mg total) by mouth every 6 (six) hours as needed. 08/24/17   Arby Barrette, MD  albuterol (PROVENTIL HFA;VENTOLIN HFA) 108 (90 Base) MCG/ACT inhaler Inhale 1-2 puffs into the lungs every 6 (six) hours as needed for wheezing or shortness of breath.    [provider]  diclofenac (VOLTAREN) 75 MG EC tablet Take 1 tablet (75 mg total) by mouth 2 (two) times daily. Patient not taking: Reported on 08/24/2017 04/06/17   Mardella Layman, MD  ondansetron (ZOFRAN ODT) 8 MG disintegrating tablet Take 1 tablet (8 mg total) by mouth every 8 (eight) hours as needed for nausea or vomiting. Patient not taking: Reported on 08/24/2017 07/09/17   Molpus, Jonny Ruiz, MD  pseudoephedrine (SUDAFED 12 HOUR) 120 MG 12 hr tablet Take 1 tablet (120 mg total) by mouth 2 (two) times daily. 08/24/17   Arby Barrette, MD    Family History Family History  Problem Relation Age of Onset  . Diabetes Maternal Grandmother   . Hypertension Father   . Heart disease Father   . Lung disease  Father   . Anesthesia problems Neg Hx   . Other Neg Hx     Social History Social History   Tobacco Use  . Smoking status: Current Some Day Smoker    Packs/day: 0.25    Years: 5.00    Pack years: 1.25    Types: Cigarettes    Last attempt to quit: 12/27/2012    Years since quitting: 5.0  . Smokeless tobacco: Never Used  Substance Use Topics  . Alcohol use: No  . Drug use: No     Allergies   Iodine and Sulfonamide derivatives   Review of Systems Review of Systems  Constitutional: Negative for fever.  HENT: Positive for congestion and rhinorrhea. Negative for sore throat.   Eyes: Negative for redness.  Respiratory: Positive for chest tightness, shortness of breath and wheezing. Negative for cough.   Cardiovascular: Negative  for chest pain.  Gastrointestinal: Negative for abdominal pain, diarrhea, nausea and vomiting.  Genitourinary: Negative for dysuria.  Musculoskeletal: Negative for myalgias.  Skin: Negative for rash.  Neurological: Negative for headaches.     Physical Exam Updated Vital Signs BP 127/86 (BP Location: Left Arm)   Pulse 91   Temp (!) 97.5 F (36.4 C) (Axillary)   Resp 16   Ht 5\' 5"  (1.651 m)   Wt 99.8 kg   SpO2 100% Comment: Breathing treatment   BMI 36.61 kg/m   Physical Exam  Constitutional: She appears well-developed and well-nourished.  Odor of marijuana  HENT:  Head: Normocephalic and atraumatic.  Eyes: Conjunctivae are normal. Right eye exhibits no discharge. Left eye exhibits no discharge.  Neck: Normal range of motion. Neck supple.  Cardiovascular: Normal rate, regular rhythm and normal heart sounds.  Pulmonary/Chest: Effort normal. She has wheezes (Slight scattered end expiratory wheeze). She has no rhonchi. She has no rales.  Abdominal: Soft. There is no tenderness.  Neurological: She is alert.  Skin: Skin is warm and dry.  Psychiatric: She has a normal mood and affect.  Nursing note and vitals reviewed.    ED Treatments / Results  Labs (all labs ordered are listed, but only abnormal results are displayed) Labs Reviewed - No data to display  EKG None  Radiology No results found.  Procedures Procedures (including critical care time)  Medications Ordered in ED Medications  predniSONE (DELTASONE) tablet 60 mg (60 mg Oral Given 01/02/18 0636)  albuterol (PROVENTIL) (2.5 MG/3ML) 0.083% nebulizer solution 5 mg (5 mg Nebulization Given 01/02/18 0848)  ipratropium (ATROVENT) nebulizer solution 0.5 mg (0.5 mg Nebulization Given 01/02/18 0849)  acetaminophen (TYLENOL) tablet 650 mg (650 mg Oral Given 01/02/18 0849)  albuterol (PROVENTIL) (2.5 MG/3ML) 0.083% nebulizer solution 5 mg (5 mg Nebulization Given 01/02/18 1007)  ipratropium (ATROVENT) nebulizer  solution 0.5 mg (0.5 mg Nebulization Given 01/02/18 1007)     Initial Impression / Assessment and Plan / ED Course  I have reviewed the triage vital signs and the nursing notes.  Pertinent labs & imaging results that were available during my care of the patient were reviewed by me and considered in my medical decision making (see chart for details).     Patient seen and examined.   Vital signs reviewed and are as follows: BP 127/86 (BP Location: Left Arm)   Pulse 91   Temp (!) 97.5 F (36.4 C) (Axillary)   Resp 16   Ht 5\' 5"  (1.651 m)   Wt 99.8 kg   SpO2 100% Comment: Breathing treatment   BMI 36.61 kg/m  Well-appearing patient on breathing treatment.  States that she is already starting to feel better.  Overall good air movement with scattered expiratory wheezing noted on exam.  Will reassess.  Will give prednisone.  7:21 AM additional breathing treatment ordered.  On reassessment, patient continues to have mild expiratory wheezing.  Good air movement.  She appears in no distress.  Patient was reassessed.  She did not yet receive her second breathing treatment here.  Symptoms remain the same.  After second treatment, patient was reassessed.  She has near resolution of her wheezing.  She feels comfortable to go home at this time.  She requests albuterol inhaler.  Encouraged the patient to continue to use this every 4 hours for the next 24 hours.  Discussed return to the emergency department if her symptoms were to worsen at home and do not respond to her home medications.  This includes worsening persistent wheezing, increased shortness of breath, difficulty breathing, if he develops new symptoms such as fever or if she has any other concerns.  Patient verbalizes understanding and agrees with plan.  Encouraged her to follow-up with a primary care doctor when able to discuss control of her asthma.  Final Clinical Impressions(s) / ED Diagnoses   Final diagnoses:  Bronchospasm   Upper respiratory tract infection, unspecified type   Patient with asthma with likely mild upper respiratory tract infection leading to poor control of her symptoms.  Patient was treated in the emergency department with several nebulizer treatments with improvement.  She was also started on prednisone.  Patient was not hypoxic.  She tachycardic with her breathing treatments however this improved.  She has no clinical signs of DVT or PE and is not at high risk for this.  She is also not having any chest pain to suggest PE.  ED Discharge Orders         Ordered    predniSONE (DELTASONE) 20 MG tablet  Daily     01/02/18 1131             Renne CriglerGeiple, Maryse Brierley, PA-C 01/02/18 1457    Dione BoozeGlick, David, MD 01/02/18 2241

## 2018-02-23 ENCOUNTER — Encounter (HOSPITAL_COMMUNITY): Payer: Self-pay | Admitting: Emergency Medicine

## 2018-02-23 ENCOUNTER — Other Ambulatory Visit: Payer: Self-pay

## 2018-02-23 ENCOUNTER — Ambulatory Visit (HOSPITAL_COMMUNITY)
Admission: EM | Admit: 2018-02-23 | Discharge: 2018-02-23 | Disposition: A | Discharge status: Home / Self Care | Payer: Medicaid Other | Attending: Family Medicine | Admitting: Family Medicine

## 2018-02-23 DIAGNOSIS — J029 Acute pharyngitis, unspecified: Secondary | ICD-10-CM

## 2018-02-23 LAB — POCT RAPID STREP A: STREPTOCOCCUS, GROUP A SCREEN (DIRECT): NEGATIVE

## 2018-02-23 MED ORDER — AMOXICILLIN 875 MG PO TABS: 875.0000 mg | ORAL_TABLET | Freq: Two times a day (BID) | ORAL | 0 refills | Status: DC

## 2018-02-23 NOTE — ED Triage Notes (Signed)
Sore throat for 4 days. OTC remedies not helping.

## 2018-02-23 NOTE — ED Provider Notes (Signed)
[facility]   [id number] 02/23/18 Arrival Time: 1122  ASSESSMENT & PLAN:  1. Sore throat    Rapid strep negative. But, given her exam, will empirically treat with: Meds ordered this encounter  Medications  . amoxicillin (AMOXIL) 875 MG tablet    Sig: Take 1 tablet (875 mg total) by mouth 2 (two) times daily for 10 days.    Dispense:  20 tablet    Refill:  0   No sign of peritonsillar abscess. Discussed.  Results for orders placed or performed during the hospital encounter of 02/23/18  POCT rapid strep A [facility])  Result Value Ref Range   Streptococcus, Group A Screen (Direct) NEGATIVE NEGATIVE   Labs Reviewed  CULTURE, GROUP A STREP [facility])  POCT RAPID STREP A    OTC analgesics and throat care as needed  Instructed to finish full 10 day course of antibiotics. Will follow up if not showing significant improvement over the next 24-48 hours, sooner if needed.    Discharge Instructions      You may use over the counter ibuprofen or acetaminophen as needed.   For a sore throat, over the counter products such as [employer] Peroxyl Mouth Sore Rinse or Chloraseptic Sore Throat Spray may provide some temporary relief.  Your rapid strep test was negative today. We have sent your throat swab for culture and will let you know of any positive results.   Reviewed expectations re: course of current medical issues. Questions answered. Outlined signs and symptoms indicating need for more acute intervention. Patient verbalized understanding. After Visit Summary given.   SUBJECTIVE:  Brittany [name] is a 28 y.o. female who reports a sore throat. Describes as pain, especially with swallowing; L>>R. Onset gradual beginning 3-4 days ago. No respiratory symptoms except for a very mild dry cough. Decreased PO intake secondary to discomfort with swallowing. No change in voice reported. Fever reported: unsure; "have felt chilled". No neck pain or swelling. No  associated n/v/abdominal symptoms. Sick contacts: none known. OTC treatment: Tylenol without much help.  ROS: As per HPI.   OBJECTIVE:  Vitals:   02/23/18 1142  BP: 122/85  Pulse: 90  Resp: 16  Temp: 98.3 F (36.8 C)  TempSrc: Oral  SpO2: 98%     General appearance: alert; no distress HEENT: throat with tonsillar hypertrophy or L tonsil and marked erythema; uvula midline: yes Neck: supple with FROM; small cervical LAD on left; mildly tender CV: RRR Lungs: clear to auscultation bilaterally Abd: soft; non-tender Skin: reveals no rash; warm and dry Psychological: alert and cooperative; normal mood and affect  Allergies  Allergen Reactions  . Iodine Other (See Comments)    blistering  . Sulfonamide Derivatives Other (See Comments)    blistering    Past Medical History:  Diagnosis Date  . Asthma   . Chlamydia   . Gonorrhea   . Hx MRSA infection 2005  . NVD (normal vaginal delivery) 08/30/2010  . Trichimoniasis    Social History   Socioeconomic History  . Marital status: Single    Spouse name: Not on file  . Number of children: Not on file  . Years of education: Not on file  . Highest education level: Not on file  Occupational History  . Not on file  Social Needs  . Financial resource strain: Not on file  . Food insecurity:    Worry: Not on file    Inability: Not on file  . Transportation needs:    Medical:  Not on file    Non-medical: Not on file  Tobacco Use  . Smoking status: Current Some Day Smoker    Packs/day: 0.25    Years: 5.00    Pack years: 1.25    Types: Cigarettes    Last attempt to quit: 12/27/2012    Years since quitting: 5.1  . Smokeless tobacco: Never Used  Substance and Sexual Activity  . Alcohol use: No  . Drug use: No  . Sexual activity: Yes    Birth control/protection: None    Comment: last intercourse 4 days ago  Lifestyle  . Physical activity:    Days per week: Not on file    Minutes per session: Not on file  . Stress:  Not on file  Relationships  . Social connections:    Talks on phone: Not on file    Gets together: Not on file    Attends religious service: Not on file    Active member of club or organization: Not on file    Attends meetings of clubs or organizations: Not on file    Relationship status: Not on file  . Intimate partner violence:    Fear of current or ex partner: Not on file    Emotionally abused: Not on file    Physically abused: Not on file    Forced sexual activity: Not on file  Other Topics Concern  . Not on file  Social History Narrative  . Not on file   Family History  Problem Relation Age of Onset  . Diabetes Maternal Grandmother   . Hypertension Father   . Heart disease Father   . Lung disease Father   . Anesthesia problems Neg Hx   . Other Neg Hx           Mardella LaymanHagler, Mahrukh Seguin, MD 02/23/18 1257

## 2018-02-23 NOTE — Discharge Instructions (Addendum)
You may use over the counter ibuprofen or acetaminophen as needed.  For a sore throat, over the counter products such as [employer] Peroxyl Mouth Sore Rinse or Chloraseptic Sore Throat Spray may provide some temporary relief. Your rapid strep test was negative today. We have sent your throat swab for culture and will let you know of any positive results. 

## 2018-02-24 ENCOUNTER — Other Ambulatory Visit: Payer: Self-pay

## 2018-02-24 ENCOUNTER — Observation Stay (HOSPITAL_COMMUNITY)
Admission: EM | Admit: 2018-02-24 | Discharge: 2018-02-26 | Disposition: A | Discharge status: Home / Self Care | Payer: Medicaid Other | Attending: Family Medicine | Admitting: Family Medicine

## 2018-02-24 ENCOUNTER — Encounter (HOSPITAL_COMMUNITY): Payer: Self-pay

## 2018-02-24 DIAGNOSIS — Z79899 Other long term (current) drug therapy: Secondary | ICD-10-CM | POA: Diagnosis not present

## 2018-02-24 DIAGNOSIS — B9689 Other specified bacterial agents as the cause of diseases classified elsewhere: Secondary | ICD-10-CM | POA: Insufficient documentation

## 2018-02-24 DIAGNOSIS — J45909 Unspecified asthma, uncomplicated: Secondary | ICD-10-CM

## 2018-02-24 DIAGNOSIS — Z882 Allergy status to sulfonamides status: Secondary | ICD-10-CM | POA: Insufficient documentation

## 2018-02-24 DIAGNOSIS — Z888 Allergy status to other drugs, medicaments and biological substances status: Secondary | ICD-10-CM | POA: Diagnosis not present

## 2018-02-24 DIAGNOSIS — F17210 Nicotine dependence, cigarettes, uncomplicated: Secondary | ICD-10-CM | POA: Insufficient documentation

## 2018-02-24 DIAGNOSIS — R130 Aphagia: Secondary | ICD-10-CM

## 2018-02-24 DIAGNOSIS — Z8249 Family history of ischemic heart disease and other diseases of the circulatory system: Secondary | ICD-10-CM | POA: Insufficient documentation

## 2018-02-24 DIAGNOSIS — J36 Peritonsillar abscess: Secondary | ICD-10-CM | POA: Diagnosis present

## 2018-02-24 DIAGNOSIS — O99511 Diseases of the respiratory system complicating pregnancy, first trimester: Secondary | ICD-10-CM | POA: Diagnosis not present

## 2018-02-24 DIAGNOSIS — H9202 Otalgia, left ear: Secondary | ICD-10-CM | POA: Diagnosis not present

## 2018-02-24 DIAGNOSIS — O99331 Smoking (tobacco) complicating pregnancy, first trimester: Secondary | ICD-10-CM | POA: Diagnosis not present

## 2018-02-24 DIAGNOSIS — Z3A01 Less than 8 weeks gestation of pregnancy: Secondary | ICD-10-CM | POA: Insufficient documentation

## 2018-02-24 DIAGNOSIS — Z3490 Encounter for supervision of normal pregnancy, unspecified, unspecified trimester: Secondary | ICD-10-CM

## 2018-02-24 DIAGNOSIS — R030 Elevated blood-pressure reading, without diagnosis of hypertension: Secondary | ICD-10-CM | POA: Diagnosis not present

## 2018-02-24 DIAGNOSIS — O26891 Other specified pregnancy related conditions, first trimester: Principal | ICD-10-CM | POA: Insufficient documentation

## 2018-02-24 LAB — POC URINE PREG, ED: PREG TEST UR: POSITIVE — AB

## 2018-02-24 MED ORDER — BENZOCAINE 20 % MT AERO
INHALATION_SPRAY | Freq: Once | OROMUCOSAL | Status: AC
  Administered 2018-02-24: 22:00:00 via OROMUCOSAL

## 2018-02-24 MED ORDER — LIDOCAINE-EPINEPHRINE (PF) 2 %-1:200000 IJ SOLN
10.0000 mL | Freq: Once | INTRAMUSCULAR | Status: AC
  Administered 2018-02-24: 10 mL
  Filled 2018-02-24: qty 20

## 2018-02-24 MED ORDER — CLINDAMYCIN HCL 300 MG PO CAPS
300.0000 mg | ORAL_CAPSULE | Freq: Once | ORAL | Status: AC
  Administered 2018-02-24: 300 mg via ORAL
  Filled 2018-02-24: qty 1

## 2018-02-24 MED ORDER — LIDOCAINE VISCOUS HCL 2 % MT SOLN
15.0000 mL | Freq: Once | OROMUCOSAL | Status: AC
  Administered 2018-02-24: 15 mL via OROMUCOSAL
  Filled 2018-02-24: qty 15

## 2018-02-24 NOTE — ED Triage Notes (Deleted)
Patient presents with flu like symptoms that she as seen for at [facility] yesterday. Patient states she has been taking OTC alkaseltzer cold, robutussin, and ibuprofen with no relief. Patient complaining of fever, malaise, N/V. Patient states 6 episodes of vomiting in 24 hours. Patient has been able to tolerate 4 saltines.

## 2018-02-24 NOTE — ED Provider Notes (Signed)
[facility]-EMERGENCY DEPT Provider Note   CSN: [id number] Arrival date & time: 02/24/18  1942     History   Chief Complaint Chief Complaint  Patient presents with  . Sore Throat    HPI Brittany [name] is a 28 y.o. female with history of peritonsillar abscess presenting for a 3-day history of sore throat.  Patient seen and evaluated at urgent care yesterday who began patient on amoxicillin.  Patient has been using medication without relief.  She states that today her pain acutely worsened, primarily left-sided burning pain constant and severe in severity.  Pain worse with swallowing.  Patient states that she has been spitting her saliva out into her back due to inability to swallow due to pain.  HPI  Past Medical History:  Diagnosis Date  . Asthma   . Chlamydia   . Gonorrhea   . Hx MRSA infection 2005  . NVD (normal vaginal delivery) 08/30/2010  . Trichimoniasis     Patient Active Problem List   Diagnosis Date Noted  . Status post normal vaginal delivery 09/05/2013  . Other specified indication for care or intervention related to labor and delivery, unspecified as to episode of care 09/03/2013  . Elevated blood pressure complicating pregnancy, antepartum 12/01/2011    Past Surgical History:  Procedure Laterality Date  . MULTIPLE TOOTH EXTRACTIONS    . NO PAST SURGERIES       OB History    Gravida  4   Para  4   Term  4   Preterm      AB      Living  4     SAB      TAB      Ectopic      Multiple      Live Births  4            Home Medications    Prior to Admission medications   Medication Sig Start Date End Date Taking? Authorizing Provider  albuterol (PROVENTIL HFA;VENTOLIN HFA) 108 (90 Base) MCG/ACT inhaler Inhale 1-2 puffs into the lungs every 6 (six) hours as needed for wheezing or shortness of breath.   Yes [provider]  amoxicillin (AMOXIL) 875 MG tablet Take 1 tablet (875 mg total) by mouth 2  (two) times daily for 10 days. 02/23/18 03/05/18 Yes Hagler, Arlys John, MD  predniSONE (DELTASONE) 20 MG tablet Take 2 tablets (40 mg total) by mouth daily. Patient not taking: Reported on 02/24/2018 01/02/18   Renne Crigler, PA-C    Family History Family History  Problem Relation Age of Onset  . Diabetes Maternal Grandmother   . Hypertension Father   . Heart disease Father   . Lung disease Father   . Anesthesia problems Neg Hx   . Other Neg Hx     Social History Social History   Tobacco Use  . Smoking status: Current Some Day Smoker    Packs/day: 0.25    Years: 5.00    Pack years: 1.25    Types: Cigarettes    Last attempt to quit: 12/27/2012    Years since quitting: 5.1  . Smokeless tobacco: Never Used  Substance Use Topics  . Alcohol use: No  . Drug use: No     Allergies   Iodine and Sulfonamide derivatives   Review of Systems Review of Systems  Constitutional: Negative.  Negative for chills and fever.  HENT: Positive for drooling, sore throat, trouble swallowing and voice change. Negative for rhinorrhea.  Respiratory: Negative.  Negative for cough and shortness of breath.   Gastrointestinal: Negative.  Negative for abdominal pain, diarrhea, nausea and vomiting.  Genitourinary: Negative.  Negative for dysuria and hematuria.  Neurological: Negative.  Negative for dizziness, weakness and headaches.  All other systems reviewed and are negative.  Physical Exam Updated Vital Signs BP 128/89 (BP Location: Left Arm)   Pulse 87   Temp 99 F (37.2 C) (Oral)   Resp 18   Ht 5\' 4"  (1.626 m)   Wt 96.2 kg   LMP 01/26/2018   SpO2 98%   BMI 36.39 kg/m   Physical Exam Constitutional:      General: She is not in acute distress.    Appearance: She is well-developed. She is not ill-appearing.  HENT:     Head: Normocephalic and atraumatic.     Jaw: There is normal jaw occlusion.     Right Ear: External ear normal.     Left Ear: External ear normal.     Nose: Nose normal.      Mouth/Throat:     Lips: Pink.     Mouth: Mucous membranes are moist.     Pharynx: Uvula midline. No uvula swelling.     Tonsils: Tonsillar abscess present. No tonsillar exudate. Swelling: 1+ on the right. 2+ on the left.   Eyes:     General: Vision grossly intact. Gaze aligned appropriately.     Extraocular Movements: Extraocular movements intact.     Conjunctiva/sclera: Conjunctivae normal.     Pupils: Pupils are equal, round, and reactive to light.  Neck:     Musculoskeletal: Full passive range of motion without pain, normal range of motion and neck supple.     Trachea: Trachea normal. No tracheal tenderness or tracheal deviation.  Cardiovascular:     Rate and Rhythm: Normal rate and regular rhythm.     Heart sounds: Normal heart sounds.  Pulmonary:     Effort: Pulmonary effort is normal. No respiratory distress.     Breath sounds: Normal breath sounds and air entry.  Abdominal:     Palpations: Abdomen is soft.     Tenderness: There is no abdominal tenderness. There is no guarding or rebound.  Musculoskeletal: Normal range of motion.  Lymphadenopathy:     Cervical: Cervical adenopathy present.  Skin:    General: Skin is warm and dry.     Capillary Refill: Capillary refill takes less than 2 seconds.  Neurological:     Mental Status: She is alert and oriented to person, place, and time.     GCS: GCS eye subscore is 4. GCS verbal subscore is 5. GCS motor subscore is 6.     Comments: Speech is clear and goal oriented, follows commands Major Cranial nerves without deficit, no facial droop Normal strength in upper and lower extremities bilaterally including dorsiflexion and plantar flexion, strong and equal grip strength Sensation normal to light touch Moves extremities without ataxia, coordination intact Normal gait  Psychiatric:        Behavior: Behavior normal.    ED Treatments / Results  Labs (all labs ordered are listed, but only abnormal results are displayed) Labs  Reviewed  POC URINE PREG, ED - Abnormal; Notable for the following components:      Result Value   Preg Test, Ur POSITIVE (*)    All other components within normal limits  AEROBIC CULTURE (SUPERFICIAL SPECIMEN)  GC/CHLAMYDIA PROBE AMP ([place]) NOT AT [facility]    EKG None  Radiology  No results found.  Procedures Procedures (including critical care time)  Medications Ordered in ED Medications  lidocaine (XYLOCAINE) 2 % viscous mouth solution 15 mL (15 mLs Mouth/Throat Given 02/24/18 2127)  lidocaine-EPINEPHrine (XYLOCAINE W/EPI) 2 %-1:200000 (PF) injection 10 mL (10 mLs Infiltration Given 02/24/18 2336)  Benzocaine (HURRCAINE) 20 % mouth spray ( Mouth/Throat Given 02/24/18 2223)  clindamycin (CLEOCIN) capsule 300 mg (300 mg Oral Given 02/24/18 2343)     Initial Impression / Assessment and Plan / ED Course  I have reviewed the triage vital signs and the nursing notes.  Pertinent labs & imaging results that were available during my care of the patient were reviewed by me and considered in my medical decision making (see chart for details).  Clinical Course as of Feb 25 5  Caleen Essex Feb 24, 2018  2323 Discussed Clindamyicn use with Laney Potash, house pharmacist, agrees that use clindamycin may be used in first trimester.   [BM]    Clinical Course User Index [BM] Elizabeth Palau   8:35 PM: Patient assessment patient is well-appearing no acute distress.  Talking with her family member and laughing. Uvula is midline.  Patient with emesis bag full of saliva at bedside.  Airway is grossly intact. -------------------- Positive pregnancy test, patient allowed results to be relayed to her boyfriend at bedside.  This is patient's fifth child, she states her last menstrual cycle was in the beginning of December. --------------------- 9:15 PM: Patient seen and evaluated by Dr. Rush Landmark.  Concern for inferior left-sided peritonsillar abscess.  Consult called to ENT for further guidance and  potential draining and imaging in pregnant female. ----------------------- Patient's peritonsillar abscess has been drained by ENT.  Patient is unable to swallow due to pain.  Per ENT since patient is unable to tolerate p.o. she requires overnight observation and pain control/antibiotics.  Patient has been observed for greater than 1 hour since source control and is still unable to swallow water secondary to pain.  Case rediscussed with Dr. Rush Landmark who agrees with ENT recommendations.  Consult is been called to hospitalist for admission. -------- Patient has been admitted to hospitalist service by Dr. Ashok Pall for observation regarding her inability to swallow.  Per Hospitalist, GC chlamydia swab of her throat has been ordered.   Note: Portions of this report may have been transcribed using voice recognition software. Every effort was made to ensure accuracy; however, inadvertent computerized transcription errors may still be present. Final Clinical Impressions(s) / ED Diagnoses   Final diagnoses:  Peritonsillar abscess  Inability to swallow    ED Discharge Orders    None       Elizabeth Palau 02/25/18 0021    Tegeler, Canary Brim, MD 02/25/18 [id number]

## 2018-02-24 NOTE — Consult Note (Signed)
[facility] OTOLARYNGOLOGY CONSULTATION  Primary Care Physician: Patient, [name] Patient Location at Initial Consult: Emergency Department Chief Complaint/Reason for Consult: Left peritonsillar abscess  History of Presenting Illness:  History obtained from the patient Brittany [name] is a  28 y.o. female presenting with swelling of the left side of her throat.  She has had some minor throat pain for 3 to 5 days.  This is worsened acutely in the last 3 days, worse in the left.  There is some mild associated left-sided ear pain.  She has had a peritonsillar abscess in the past, unsure which side, couple of years ago.  This spontaneously drained.  She is never had to have an abscess drained.  She has been able to stay hydrated.  There are no airway concerns.  There is no trismus.  Strep testing was negative.  She was seen a couple of days ago and prescribed amoxicillin.  Her throat pain has continued to worsen.  She has been spitting out her secretions as they are too painful to swallow.  She is not currently eating.  She has had subjective fever and chills.  She is approximately [redacted] weeks pregnant and diagnosed here in the emergency department.  Therefore no imaging was done.  She has 4 young children at home.  Here with her significant other.  Past Medical History:  Diagnosis Date  . Asthma   . Chlamydia   . Gonorrhea   . Hx MRSA infection 2005  . NVD (normal vaginal delivery) 08/30/2010  . Trichimoniasis     Past Surgical History:  Procedure Laterality Date  . MULTIPLE TOOTH EXTRACTIONS    . NO PAST SURGERIES      Family History  Problem Relation Age of Onset  . Diabetes Maternal Grandmother   . Hypertension Father   . Heart disease Father   . Lung disease Father   . Anesthesia problems Neg Hx   . Other Neg Hx     Social History   Socioeconomic History  . Marital status: Single    Spouse name: Not on file  . Number of children: Not on file  .  Years of education: Not on file  . Highest education level: Not on file  Occupational History  . Not on file  Social Needs  . Financial resource strain: Not on file  . Food insecurity:    Worry: Not on file    Inability: Not on file  . Transportation needs:    Medical: Not on file    Non-medical: Not on file  Tobacco Use  . Smoking status: Current Some Day Smoker    Packs/day: 0.25    Years: 5.00    Pack years: 1.25    Types: Cigarettes    Last attempt to quit: 12/27/2012    Years since quitting: 5.1  . Smokeless tobacco: Never Used  Substance and Sexual Activity  . Alcohol use: No  . Drug use: No  . Sexual activity: Yes    Birth control/protection: None    Comment: last intercourse 4 days ago  Lifestyle  . Physical activity:    Days per week: Not on file    Minutes per session: Not on file  . Stress: Not on file  Relationships  . Social connections:    Talks on phone: Not on file    Gets together: Not on file    Attends religious service: Not on file    Active member of club or  organization: Not on file    Attends meetings of clubs or organizations: Not on file    Relationship status: Not on file  Other Topics Concern  . Not on file  Social History Narrative  . Not on file    No current facility-administered medications on file prior to encounter.    Current Outpatient Medications on File Prior to Encounter  Medication Sig Dispense Refill  . albuterol (PROVENTIL HFA;VENTOLIN HFA) 108 (90 Base) MCG/ACT inhaler Inhale 1-2 puffs into the lungs every 6 (six) hours as needed for wheezing or shortness of breath.    Marland Kitchen. amoxicillin (AMOXIL) 875 MG tablet Take 1 tablet (875 mg total) by mouth 2 (two) times daily for 10 days. 20 tablet 0  . predniSONE (DELTASONE) 20 MG tablet Take 2 tablets (40 mg total) by mouth daily. (Patient not taking: Reported on 02/24/2018) 8 tablet 0    Allergies  Allergen Reactions  . Iodine Other (See Comments)    blistering  . Sulfonamide  Derivatives Other (See Comments)    blistering     Review of Systems: ROS was completed and negative except the above OBJECTIVE: Vital Signs: Vitals:   02/24/18 1945  BP: 135/82  Pulse: (!) 101  Resp: 18  Temp: 99 F (37.2 C)  SpO2: 99%    I&O No intake or output data in the 24 hours ending 02/24/18 2204  Physical Exam General: Well developed, well nourished. No acute distress. Voice strong, no hot potato quality.  In no distress.  Head/Face: Normocephalic, atraumatic. No scars or lesions. No sinus tenderness. Facial nerve intact and equal bilaterally.  No facial lacerations. Salivary glands non tender and without palpable masses  Eyes: Globes well positioned, no proptosis Lids: No periorbital edema/ecchymosis. No lid laceration Conjunctiva: No chemosis, hemorrhage ED RRL  Ears: No gross deformity. Normal external canal. Tympanic membrane tact bilaterally, left tympanic membrane cloudy with reactive middle ear effusion  Hearing:  Normal speech reception.  Nose: No gross deformity or lesions. No purulent discharge. Septum midline. No turbinate hypertrophy.  Mouth/Oropharynx: Lips without any lesions. Dentition fair. No mucosal lesions within the oropharynx.  Left tonsillar swelling with left peritonsillar fluctuance palpable.  There is no trismus.  Base of tongue is normal.  Voice is normal.  Neck: Trachea midline. No masses. No thyromegaly or nodules palpated. No crepitus.  Lymphatic: No lymphadenopathy in the neck.  Respiratory: No stridor or distress.  Cardiovascular: Regular rate and rhythm.  Extremities: No edema or cyanosis. Warm and well-perfused.  Skin: No scars or lesions on face or neck.  Neurologic: CN II-XII intact. Moving all extremities without gross abnormality.  Other:      Labs: Lab Results  Component Value Date   WBC 6.9 04/06/2017   HGB 12.9 04/06/2017   HCT 37.3 04/06/2017   PLT 245 04/06/2017   ALT 11 (L) 04/06/2017   AST 18 04/06/2017   NA 141  04/06/2017   K 3.8 04/06/2017   CL 109 04/06/2017   CREATININE 0.85 04/06/2017   BUN 8 04/06/2017   CO2 24 04/06/2017     Review of Ancillary Data / Diagnostic Tests: None  Procedure: Incision and drainage of left peritonsillar abscess Pre-Op Dx:  Left  Peritonsillar Abscess Post-op Dx: same  Procedure details: With the patient in a comfortable seated position, Hurricaine spray was utilized to anesthetize the left peritonsillar pillar.  1% lidocaine with 1 100,000 epinephrine was injected submucosally.  Incision was made in the anterior tonsillar pillar.  Blunt dissection  was carried posterior to the tonsil into the peritonsillar space.  Frank purulence was identified.  Wound culture was obtained.  All loculations were open with blunt dissection.  The wound bed was then copiously irrigated with normal saline.  The patient tolerated the procedure well and all instrumentation was removed.      ASSESSMENT:  28 y.o. female with left peritonsillar abscess.  RECOMMENDATIONS: Clindamycin 300mg  TID x 7 day course Prednisone dose in ED Follow up with Dr. Doran HeaterMarcellino in 7 days for wound recheck.    Misty StanleyAmanda Jo Marcellino, MD  [facility] Office phone [phone]

## 2018-02-24 NOTE — ED Triage Notes (Signed)
Pt reports that she was diagnosed with an inflamed L tonsil 2 days ago. She states that it has worsened. She is able to speak, but states that it is becoming difficult to swallow her own secretions. She also endorses cough. Pt has a hx of a tonsillar abscess.

## 2018-02-24 NOTE — Discharge Instructions (Signed)
RINSE INSTRUCTIONS  Irrigate your mouth with the following mixture 3 times daily for the next 5 days:  1/2 cup of warm water  1 teaspoon of salt  1/2 teaspoon of honey  Eat a soft diet for the next few days. No hard/crunchy foods or foods with sharp edges.  Your pain should continue to improve over the next couple of days.  Take tylenol as needed for pain. Follow the package directions.  Take a probiotic, such as yogurt with active cultures, while taking your antibiotic. Take your antibiotic until the treatment is complete. Do not stop early.   Return to the ER if you experience any difficulty breathing or a significant bleeding event. Call the clinic if you have other concerns.

## 2018-02-25 DIAGNOSIS — J45909 Unspecified asthma, uncomplicated: Secondary | ICD-10-CM

## 2018-02-25 DIAGNOSIS — J36 Peritonsillar abscess: Secondary | ICD-10-CM

## 2018-02-25 LAB — DIFFERENTIAL
Abs Immature Granulocytes: 0.03 10*3/uL (ref 0.00–0.07)
BASOS PCT: 0 %
Basophils Absolute: 0 10*3/uL (ref 0.0–0.1)
Eosinophils Absolute: 0.7 10*3/uL — ABNORMAL HIGH (ref 0.0–0.5)
Eosinophils Relative: 6 %
Immature Granulocytes: 0 %
Lymphocytes Relative: 32 %
Lymphs Abs: 3.8 10*3/uL (ref 0.7–4.0)
Monocytes Absolute: 1 10*3/uL (ref 0.1–1.0)
Monocytes Relative: 8 %
NEUTROS PCT: 54 %
Neutro Abs: 6.5 10*3/uL (ref 1.7–7.7)

## 2018-02-25 LAB — RPR: RPR Ser Ql: NONREACTIVE

## 2018-02-25 LAB — CBC
HCT: 35.1 % — ABNORMAL LOW (ref 36.0–46.0)
Hemoglobin: 12 g/dL (ref 12.0–15.0)
MCH: 27.2 pg (ref 26.0–34.0)
MCHC: 34.2 g/dL (ref 30.0–36.0)
MCV: 79.6 fL — ABNORMAL LOW (ref 80.0–100.0)
NRBC: 0 % (ref 0.0–0.2)
Platelets: 348 10*3/uL (ref 150–400)
RBC: 4.41 MIL/uL (ref 3.87–5.11)
RDW: 13.3 % (ref 11.5–15.5)
WBC: 12.2 10*3/uL — ABNORMAL HIGH (ref 4.0–10.5)

## 2018-02-25 LAB — HIV ANTIBODY (ROUTINE TESTING W REFLEX): HIV Screen 4th Generation wRfx: NONREACTIVE

## 2018-02-25 MED ORDER — ALBUTEROL SULFATE HFA 108 (90 BASE) MCG/ACT IN AERS
1.0000 | INHALATION_SPRAY | Freq: Four times a day (QID) | RESPIRATORY_TRACT | Status: DC | PRN
Start: 2018-02-25 — End: 2018-02-25

## 2018-02-25 MED ORDER — ALBUTEROL SULFATE (2.5 MG/3ML) 0.083% IN NEBU: 2.5000 mg | INHALATION_SOLUTION | Freq: Four times a day (QID) | RESPIRATORY_TRACT | Status: DC | PRN

## 2018-02-25 MED ORDER — CLINDAMYCIN PHOSPHATE 600 MG/50ML IV SOLN
600.0000 mg | Freq: Three times a day (TID) | INTRAVENOUS | Status: DC
  Administered 2018-02-25 – 2018-02-26 (×5): 600 mg via INTRAVENOUS
  Filled 2018-02-25 (×6): qty 50

## 2018-02-25 MED ORDER — PRENATAL MULTIVITAMIN CH
1.0000 | ORAL_TABLET | Freq: Every day | ORAL | Status: DC
  Administered 2018-02-25 – 2018-02-26 (×2): 1 via ORAL
  Filled 2018-02-25 (×2): qty 1

## 2018-02-25 MED ORDER — ONDANSETRON 4 MG PO TBDP
4.0000 mg | ORAL_TABLET | Freq: Once | ORAL | Status: AC
  Administered 2018-02-25: 4 mg via ORAL
  Filled 2018-02-25: qty 1

## 2018-02-25 MED ORDER — ONDANSETRON HCL 4 MG/2ML IJ SOLN
4.0000 mg | Freq: Four times a day (QID) | INTRAMUSCULAR | Status: DC | PRN
  Administered 2018-02-25 – 2018-02-26 (×4): 4 mg via INTRAVENOUS
  Filled 2018-02-25 (×4): qty 2

## 2018-02-25 MED ORDER — CLINDAMYCIN PHOSPHATE 300 MG/50ML IV SOLN: 300.0000 mg | Freq: Three times a day (TID) | INTRAVENOUS | Status: DC

## 2018-02-25 MED ORDER — METHYLPREDNISOLONE SODIUM SUCC 125 MG IJ SOLR
125.0000 mg | Freq: Every day | INTRAMUSCULAR | Status: DC
  Administered 2018-02-25 – 2018-02-26 (×2): 125 mg via INTRAVENOUS
  Filled 2018-02-25 (×2): qty 2

## 2018-02-25 MED ORDER — HYDROMORPHONE HCL 1 MG/ML IJ SOLN
1.0000 mg | INTRAMUSCULAR | Status: DC | PRN
  Administered 2018-02-25 – 2018-02-26 (×10): 1 mg via INTRAVENOUS
  Filled 2018-02-25 (×10): qty 1

## 2018-02-25 MED ORDER — DIPHENHYDRAMINE HCL 25 MG PO CAPS
25.0000 mg | ORAL_CAPSULE | Freq: Four times a day (QID) | ORAL | Status: DC | PRN
  Administered 2018-02-25 – 2018-02-26 (×3): 25 mg via ORAL
  Filled 2018-02-25 (×3): qty 1

## 2018-02-25 NOTE — H&P (Signed)
History and Physical    Brittany [name]:[id number] DOB: 1990-05-16 DOA: 02/24/2018  PCP: Patient, [name]  Patient coming from: home   Chief Complaint: throat pain  HPI: Brittany [name] is a 28 y.o. female with medical history significant for asthma presents with above.  About 5 days of symptoms. Progressively worsening throat pain, including pain with swallowing. Unable to eat much at all. No difficulty breathing or lip or tongue swelling. Hx peritonsillar abscess x1. Subjective fevers. No drooling.   First day of lmp 12/9. No pelvic pain or vaginal bleeding.  ED Course: ENT consult  Review of Systems: As per HPI otherwise 10 point review of systems negative.    Past Medical History:  Diagnosis Date  . Asthma   . Chlamydia   . Gonorrhea   . Hx MRSA infection 2005  . NVD (normal vaginal delivery) 08/30/2010  . Trichimoniasis     Past Surgical History:  Procedure Laterality Date  . MULTIPLE TOOTH EXTRACTIONS    . NO PAST SURGERIES       reports that she has been smoking cigarettes. She has a 1.25 pack-year smoking history. She has never used smokeless tobacco. She reports that she does not drink alcohol or use drugs.  Allergies  Allergen Reactions  . Iodine Other (See Comments)    blistering  . Sulfonamide Derivatives Other (See Comments)    blistering    Family History  Problem Relation Age of Onset  . Diabetes Maternal Grandmother   . Hypertension Father   . Heart disease Father   . Lung disease Father   . Anesthesia problems Neg Hx   . Other Neg Hx     Prior to Admission medications   Medication Sig Start Date End Date Taking? Authorizing Provider  albuterol (PROVENTIL HFA;VENTOLIN HFA) 108 (90 Base) MCG/ACT inhaler Inhale 1-2 puffs into the lungs every 6 (six) hours as needed for wheezing or shortness of breath.   Yes [provider]  amoxicillin (AMOXIL) 875 MG tablet Take 1 tablet (875 mg total) by mouth 2 (two) times daily  for 10 days. 02/23/18 03/05/18 Yes Hagler, Arlys John, MD  predniSONE (DELTASONE) 20 MG tablet Take 2 tablets (40 mg total) by mouth daily. Patient not taking: Reported on 02/24/2018 01/02/18   Renne Crigler, PA-C    Physical Exam: Vitals:   02/24/18 1945 02/24/18 2317  BP: 135/82 128/89  Pulse: (!) 101 87  Resp: 18 18  Temp: 99 F (37.2 C)   TempSrc: Oral   SpO2: 99% 98%  Weight: 96.2 kg   Height: 5\' 4"  (1.626 m)     Constitutional: No acute distress Head: Atraumatic Eyes: Conjunctiva clear ENM: incisiln left tonsilar pillar. Uvula midline. Swelling mild left tonsilar pillar Neck: Supple, no induration or tenderness under mandible Respiratory: Clear to auscultation bilaterally, no wheezing/rales/rhonchi. Normal respiratory effort. No accessory muscle use. . Cardiovascular: Regular rate and rhythm. No murmurs/rubs/gallops. Abdomen: Non-tender, non-distended. No masses. No rebound or guarding. Positive bowel sounds. Musculoskeletal: No joint deformity upper and lower extremities. Normal ROM, no contractures. Normal muscle tone.  Skin: No rashes, lesions, or ulcers.  Extremities: No peripheral edema. Palpable peripheral pulses. Neurologic: Alert, moving all 4 extremities. Psychiatric: Normal insight and judgement.   Labs on Admission: I have personally reviewed following labs and imaging studies  CBC: No results for input(s): WBC, NEUTROABS, HGB, HCT, MCV, PLT in the last 168 hours. Basic Metabolic Panel: No results for input(s): NA, K, CL, CO2, GLUCOSE, BUN, CREATININE, CALCIUM,  MG, PHOS in the last 168 hours. GFR: CrCl cannot be calculated (Patient's most recent lab result is older than the maximum 21 days allowed.). Liver Function Tests: No results for input(s): AST, ALT, ALKPHOS, BILITOT, PROT, ALBUMIN in the last 168 hours. No results for input(s): LIPASE, AMYLASE in the last 168 hours. No results for input(s): AMMONIA in the last 168 hours. Coagulation Profile: No results  for input(s): INR, PROTIME in the last 168 hours. Cardiac Enzymes: No results for input(s): CKTOTAL, CKMB, CKMBINDEX, TROPONINI in the last 168 hours. BNP (last 3 results) No results for input(s): PROBNP in the last 8760 hours. HbA1C: No results for input(s): HGBA1C in the last 72 hours. CBG: No results for input(s): GLUCAP in the last 168 hours. Lipid Profile: No results for input(s): CHOL, HDL, LDLCALC, TRIG, CHOLHDL, LDLDIRECT in the last 72 hours. Thyroid Function Tests: No results for input(s): TSH, T4TOTAL, FREET4, T3FREE, THYROIDAB in the last 72 hours. Anemia Panel: No results for input(s): VITAMINB12, FOLATE, FERRITIN, TIBC, IRON, RETICCTPCT in the last 72 hours. Urine analysis:    Component Value Date/Time   COLORURINE YELLOW 09/20/2017 2328   APPEARANCEUR CLEAR 09/20/2017 2328   LABSPEC 1.023 09/20/2017 2328   PHURINE 5.0 09/20/2017 2328   GLUCOSEU NEGATIVE 09/20/2017 2328   HGBUR NEGATIVE 09/20/2017 2328   BILIRUBINUR NEGATIVE 09/20/2017 2328   KETONESUR NEGATIVE 09/20/2017 2328   PROTEINUR NEGATIVE 09/20/2017 2328   UROBILINOGEN 0.2 04/06/2017 1144   NITRITE NEGATIVE 09/20/2017 2328   LEUKOCYTESUR NEGATIVE 09/20/2017 2328    Radiological Exams on Admission: No results found.    Assessment/Plan Principal Problem:   Peritonsillar abscess Active Problems:   Pregnant   Asthma  # Peritonsilar abscess - s/p I and D. Unable to tolerate PO so admitted for pain control and IV meds. No s/s respiratory comprimise. - clindamycin - solumedrol - appreciate ENT recs - f/u cultures, including g/c  # Pregnancy - new dx, unplanned. [redacted]w[redacted]d by sure lmp. No pelvic pain or bleeding - start pnv - avoid pregnancy-toxic medications - outpt f/u  # Asthma - cont home proair prn  DVT prophylaxis: early ambulation Code Status: full  Family Communication: mother detist gilmer [phone]  Disposition Plan: tbd  Consults called: ENT  Admission status: obs  Silvano Bilis  MD [facility] Pager [phone]  If 7PM-7AM, please contact night-coverage [email or web address] Password [facility]  02/25/2018, 12:18 AM

## 2018-02-25 NOTE — Progress Notes (Signed)
PROGRESS NOTE  Brittany [name] ZOX:[id number] DOB: 1990/11/25 DOA: 02/24/2018 PCP: Patient, [name]  HPI/Recap of past 6324 hours: 28 year old African-American female with past medical history significant for asthma who presented with inability to keep anything down sore throat was found with peritonsillar abscess.  She was admitted for IV antibiotics.  Subjective: Patient is doing a lot better today she was able to tolerate full liquid.  She thinks she is ready to try something more solid.  Assessment/Plan: Principal Problem:   Peritonsillar abscess Active Problems:   Pregnant   Asthma   1.  Peritonsillar abscess.  Status post I&D.  Continue IV clindamycin and Solu-Medrol  2.  Pregnancy new onset will avoid pregnancy toxic medication continue IV hydration patient started on prenatal vitamin  3.  Bronchial asthma stable continue home meds as needed   Code Status: Full  Severity of Illness: The appropriate patient status for this patient is OBSERVATION. Observation status is judged to be reasonable and necessary in order to provide the required intensity of service to ensure the patient's safety. The patient's presenting symptoms, physical exam findings, and initial radiographic and laboratory data in the context of their medical condition is felt to place them at decreased risk for further clinical deterioration. Furthermore, it is anticipated that the patient will be medically stable for discharge from the hospital within 2 midnights of admission. The following factors support the patient status of observation.   " The patient's presenting symptoms include peritonsillar abscess requiring IV antibiotics. " The physical exam findings include tonsillar abscess.  P.o. intake reduced. " The initial radiographic and laboratory data are leukocytosis.     Family Communication: None at bedside  Disposition Plan: Home in the next 24  hours   Consultants:  ENT  Procedures:  I&D  Antimicrobials:  Clindamycin  DVT prophylaxis: Early ambulation   Objective: Vitals:   02/25/18 0103 02/25/18 0142 02/25/18 0614 02/25/18 1409  BP:  125/89 132/89 (!) 126/94  Pulse:  90 79 65  Resp:  18 16 16   Temp: 98.9 F (37.2 C) 98.2 F (36.8 C) (!) 97.5 F (36.4 C) 97.8 F (36.6 C)  TempSrc: Oral Oral Oral   SpO2:  100% 99% 100%  Weight:      Height:        Intake/Output Summary (Last 24 hours) at 02/25/2018 1755 Last data filed at 02/25/2018 1500 Gross per 24 hour  Intake 1026.62 ml  Output -  Net 1026.62 ml   Filed Weights   02/24/18 1945  Weight: 96.2 kg   Body mass index is 36.39 kg/m.  Exam:  . General: 28 y.o. year-old female well developed well nourished in no acute distress.  Alert and oriented x3. . Cardiovascular: Regular rate and rhythm with no rubs or gallops.  No thyromegaly or JVD noted.   Marland Kitchen. Respiratory: Clear to auscultation with no wheezes or rales. Good inspiratory effort. . Abdomen: Soft nontender nondistended with normal bowel sounds x4 quadrants. . Musculoskeletal: No lower extremity edema. 2/4 pulses in all 4 extremities. . Skin: No ulcerative lesions noted or rashes, . Psychiatry: Mood is appropriate for condition and setting    Data Reviewed: CBC: Recent Labs  Lab 02/25/18 0251  WBC 12.2*  NEUTROABS 6.5  HGB 12.0  HCT 35.1*  MCV 79.6*  PLT 348   Basic Metabolic Panel: No results for input(s): NA, K, CL, CO2, GLUCOSE, BUN, CREATININE, CALCIUM, MG, PHOS in the last 168 hours. GFR: CrCl cannot be calculated (Patient's  most recent lab result is older than the maximum 21 days allowed.). Liver Function Tests: No results for input(s): AST, ALT, ALKPHOS, BILITOT, PROT, ALBUMIN in the last 168 hours. No results for input(s): LIPASE, AMYLASE in the last 168 hours. No results for input(s): AMMONIA in the last 168 hours. Coagulation Profile: No results for input(s): INR,  PROTIME in the last 168 hours. Cardiac Enzymes: No results for input(s): CKTOTAL, CKMB, CKMBINDEX, TROPONINI in the last 168 hours. BNP (last 3 results) No results for input(s): PROBNP in the last 8760 hours. HbA1C: No results for input(s): HGBA1C in the last 72 hours. CBG: No results for input(s): GLUCAP in the last 168 hours. Lipid Profile: No results for input(s): CHOL, HDL, LDLCALC, TRIG, CHOLHDL, LDLDIRECT in the last 72 hours. Thyroid Function Tests: No results for input(s): TSH, T4TOTAL, FREET4, T3FREE, THYROIDAB in the last 72 hours. Anemia Panel: No results for input(s): VITAMINB12, FOLATE, FERRITIN, TIBC, IRON, RETICCTPCT in the last 72 hours. Urine analysis:    Component Value Date/Time   COLORURINE YELLOW 09/20/2017 2328   APPEARANCEUR CLEAR 09/20/2017 2328   LABSPEC 1.023 09/20/2017 2328   PHURINE 5.0 09/20/2017 2328   GLUCOSEU NEGATIVE 09/20/2017 2328   HGBUR NEGATIVE 09/20/2017 2328   BILIRUBINUR NEGATIVE 09/20/2017 2328   KETONESUR NEGATIVE 09/20/2017 2328   PROTEINUR NEGATIVE 09/20/2017 2328   UROBILINOGEN 0.2 04/06/2017 1144   NITRITE NEGATIVE 09/20/2017 2328   LEUKOCYTESUR NEGATIVE 09/20/2017 2328   Sepsis Labs: @LABRCNTIP (procalcitonin:4,lacticidven:4)  ) Recent Results (from the past 240 hour(s))  Culture, group A strep (throat)     Status: None (Preliminary result)   Collection Time: 02/23/18 12:23 PM  Result Value Ref Range Status   Specimen Description THROAT  Final   Special Requests NONE  Final   Culture   Final    CULTURE REINCUBATED FOR BETTER GROWTH Performed at [facility], [address] [place], [place] [address]    Report Status PENDING  Incomplete  Wound or Superficial Culture     Status: None (Preliminary result)   Collection Time: 02/24/18 10:43 PM  Result Value Ref Range Status   Specimen Description   Final    TONSIL Performed at [facility], [address] [place], [place] [address]    Special  Requests   Final    Normal Performed at [facility], [address], [place], [place] [address]    Gram Stain   Final    FEW WBC PRESENT, PREDOMINANTLY MONONUCLEAR ABUNDANT GRAM VARIABLE ROD ABUNDANT GRAM POSITIVE COCCI Performed at [facility], [address] [place], [place] [address]    Culture PENDING  Incomplete   Report Status PENDING  Incomplete      Studies: No results found.  Scheduled Meds: . methylPREDNISolone (SOLU-MEDROL) injection  125 mg Intravenous Daily  . prenatal multivitamin  1 tablet Oral Q1200    Continuous Infusions: . clindamycin (CLEOCIN) IV 600 mg (02/25/18 1710)     LOS: 0 days     Myrtie Neither, MD [facility]  To reach me or the doctor on call, go to: [email or web address] Password [email or web address]  02/25/2018, 5:55 PM

## 2018-02-26 LAB — CBC WITH DIFFERENTIAL/PLATELET
ABS IMMATURE GRANULOCYTES: 0.06 10*3/uL (ref 0.00–0.07)
Basophils Absolute: 0 10*3/uL (ref 0.0–0.1)
Basophils Relative: 0 %
Eosinophils Absolute: 0 10*3/uL (ref 0.0–0.5)
Eosinophils Relative: 0 %
HCT: 37.9 % (ref 36.0–46.0)
Hemoglobin: 12.9 g/dL (ref 12.0–15.0)
Immature Granulocytes: 0 %
LYMPHS ABS: 1.7 10*3/uL (ref 0.7–4.0)
Lymphocytes Relative: 13 %
MCH: 27.3 pg (ref 26.0–34.0)
MCHC: 34 g/dL (ref 30.0–36.0)
MCV: 80.1 fL (ref 80.0–100.0)
Monocytes Absolute: 1 10*3/uL (ref 0.1–1.0)
Monocytes Relative: 7 %
Neutro Abs: 10.8 10*3/uL — ABNORMAL HIGH (ref 1.7–7.7)
Neutrophils Relative %: 80 %
Platelets: 360 10*3/uL (ref 150–400)
RBC: 4.73 MIL/uL (ref 3.87–5.11)
RDW: 13.1 % (ref 11.5–15.5)
WBC: 13.6 10*3/uL — ABNORMAL HIGH (ref 4.0–10.5)
nRBC: 0 % (ref 0.0–0.2)

## 2018-02-26 LAB — BASIC METABOLIC PANEL
ANION GAP: 10 (ref 5–15)
BUN: 7 mg/dL (ref 6–20)
CO2: 23 mmol/L (ref 22–32)
Calcium: 9.3 mg/dL (ref 8.9–10.3)
Chloride: 102 mmol/L (ref 98–111)
Creatinine, Ser: 0.61 mg/dL (ref 0.44–1.00)
GFR calc Af Amer: >60 mL/min (ref >60)
GFR calc non Af Amer: >60 mL/min (ref >60)
Glucose, Bld: 146 mg/dL — ABNORMAL HIGH (ref 70–99)
Potassium: 3.9 mmol/L (ref 3.5–5.1)
Sodium: 135 mmol/L (ref 135–145)

## 2018-02-26 LAB — CULTURE, GROUP A STREP (THRC)

## 2018-02-26 LAB — RUBELLA SCREEN: Rubella: 1.41 index (ref >0.99)

## 2018-02-26 MED ORDER — ACETAMINOPHEN 325 MG PO TABS: 650.0000 mg | ORAL_TABLET | Freq: Four times a day (QID) | ORAL | 0 refills | Status: DC | PRN

## 2018-02-26 MED ORDER — PREDNISONE 20 MG PO TABS: 40.0000 mg | ORAL_TABLET | Freq: Every day | ORAL | 0 refills | Status: DC

## 2018-02-26 MED ORDER — ACETAMINOPHEN 325 MG PO TABS
650.0000 mg | ORAL_TABLET | Freq: Four times a day (QID) | ORAL | Status: DC | PRN
  Administered 2018-02-26: 650 mg via ORAL
  Filled 2018-02-26: qty 2

## 2018-02-26 MED ORDER — SALINE SPRAY 0.65 % NA SOLN
1.0000 | NASAL | Status: DC | PRN
  Filled 2018-02-26: qty 44

## 2018-02-26 MED ORDER — CLINDAMYCIN HCL 300 MG PO CAPS: 300.0000 mg | ORAL_CAPSULE | Freq: Three times a day (TID) | ORAL | 0 refills | Status: AC

## 2018-02-26 MED ORDER — SALINE SPRAY 0.65 % NA SOLN: 1.0000 | NASAL | 0 refills | Status: DC | PRN

## 2018-02-26 MED ORDER — LIDOCAINE VISCOUS HCL 2 % MT SOLN: 15.0000 mL | OROMUCOSAL | 0 refills | Status: DC | PRN

## 2018-02-26 MED ORDER — PRENATAL MULTIVITAMIN CH: 1.0000 | ORAL_TABLET | Freq: Every day | ORAL | 0 refills | Status: DC

## 2018-02-26 NOTE — Care Management Note (Signed)
Case Management Note  Patient Details  Name: ZAHRIYA MARSCHNER MRN: [id number] Date of Birth: 11/13/1990  Subjective/Objective:   Peritonsillar abscess, pregnant, asthma                 Action/Plan:  Spoke to pt and states she did follow up with at [facility] for her last pregnancy and has contact number to arrange appt. State she needs a letter for work and a letter with time of last menstrual cycle and expected due date to apply for her Medicaid. Provided pt with MATCH with once per year use and $3 copay. Also info on [facility] to scheduled follow up to see a PCP. Contacted attending for letters.     Expected Discharge Date:  02/26/18               Expected Discharge Plan:  Home/Self Care  In-House Referral:  NA  Discharge planning Services  CM Consult, Medication Assistance, Indigent Health Clinic  Post Acute Care Choice:  NA Choice offered to:  NA  DME Arranged:  N/A DME Agency:  NA  HH Arranged:  NA HH Agency:  NA  Status of Service:  Completed, signed off  If discussed at Long Length of Stay Meetings, dates discussed:    Additional Comments:  Elliot Cousin, RN 02/26/2018, 12:54 PM

## 2018-02-26 NOTE — Discharge Summary (Signed)
Discharge Summary  Brittany [name]:[id number] DOB: [birth year]  PCP: Patient, [name]  Admit date: 02/24/2018 Discharge date: 02/26/2018  Time spent: 25 minutes  Recommendations for Outpatient Follow-up:  1. ENT in 3 to 7 days 2. Primary care provider in 3 to 7 days  Discharge Diagnoses:  Active Hospital Problems   Diagnosis Date Noted  . Peritonsillar abscess 02/25/2018  . Asthma 02/25/2018  . Pregnant 12/01/2011    Resolved Hospital Problems  No resolved problems to display.    Discharge Condition: Improved stable patient tolerating feeds and taking her food well on medication  Diet recommendation: Regular  Vitals:   02/25/18 2035 02/26/18 0428  BP: (!) 141/97 115/81  Pulse: 87 61  Resp: 20 18  Temp: 97.9 F (36.6 C) (!) 97.5 F (36.4 C)  SpO2: 100% 96%    History of present illness:   Brittany [name] is a  28 y.o. female presenting with swelling of the left side of her throat.  She has had some minor throat pain for 3 to 5 days.  This is worsened acutely in the last 3 days, worse in the left.  There is some mild associated left-sided ear pain  Hospital Course:  Principal Problem:   Peritonsillar abscess Active Problems:   Pregnant   Asthma  Patient was admitted for several days of sore throat.  She was treated as outpatient but with antibiotics which failed antibiotic treatment so she presented to the emergency room with worsening symptoms.  She was noted to have peritonsillar abscess.  ENT was consulted who did a bedside I&D.  The plan was to discharge patient home if she is able to swallow and tolerate secretions however she was not able to do that during her ED stay she continued to have difficulty swallowing and thus could not take her steroid and p.o. antibiotics by mouth so she was admitted for IV antibiotics and IV steroids.  During her hospital stay she remained afebrile, she is able to tolerate food now.  Her white count is slightly up  but that will need to be monitored as outpatient.  She will also follow-up with She was also found to be pregnant.  She was started on prenatal vitamin.  She will follow-up with her primary care provider who will refer her to [facility]:  ENT Dr. Billy Fischer  Procedures:  I&D  Antimicrobials:  Clindamycin  Discharge Exam: BP 115/81 (BP Location: Right Arm)   Pulse 61   Temp (!) 97.5 F (36.4 C)   Resp 18   Ht 5\' 4"  (1.626 m)   Wt 96.2 kg   SpO2 96%   BMI 36.39 kg/m   General: Retroverted x3 no distress HEENT: Tonsillar bed on the left is slightly erythematous, no edema noted.  Expected exudates noted no lymphadenopathy Cardiovascular: The rate and rhythm no murmur Respiratory: CTA bilaterally  Discharge Instructions You were cared for by a hospitalist during your hospital stay. If you have any questions about your discharge medications or the care you received while you were in the hospital after you are discharged, you can call the unit and asked to speak with the hospitalist on call if the hospitalist that took care of you is not available. Once you are discharged, your primary care physician will handle any further medical issues. Please note that NO REFILLS for any discharge medications will be authorized once you are discharged, as it is imperative that you return to your primary care physician (or  establish a relationship with a primary care physician if you do not have one) for your aftercare needs so that they can reassess your need for medications and monitor your lab values.  Discharge Instructions    Call MD for:  persistant nausea and vomiting   Complete by:  As directed    Call MD for:  persistant nausea and vomiting   Complete by:  As directed    Call MD for:  severe uncontrolled pain   Complete by:  As directed    Call MD for:  severe uncontrolled pain   Complete by:  As directed    Call MD for:  temperature >100.4   Complete by:  As directed     Call MD for:  temperature >100.4   Complete by:  As directed    Diet - low sodium heart healthy   Complete by:  As directed    Diet - low sodium heart healthy   Complete by:  As directed    Discharge instructions   Complete by:  As directed    Follow-up primary care provider within 1 week.  Also primary care provider to refer patient to [facility] for care Increase fluid intake by mouth   Discharge instructions   Complete by:  As directed    Follow up with Dr. Doran Heater in 3-7 days for wound recheck. Follow-up with primary care provider in 3 days Primary care provider to refer to OB/GYN for pregnancy care Increase fluid intake by mouth   Increase activity slowly   Complete by:  As directed    Increase activity slowly   Complete by:  As directed      Allergies as of 02/26/2018      Reactions   Iodine Other (See Comments)   blistering   Sulfonamide Derivatives Other (See Comments)   blistering      Medication List    STOP taking these medications   amoxicillin 875 MG tablet Commonly known as:  AMOXIL     TAKE these medications   acetaminophen 325 MG tablet Commonly known as:  TYLENOL Take 2 tablets (650 mg total) by mouth every 6 (six) hours as needed for moderate pain, fever or headache.   albuterol 108 (90 Base) MCG/ACT inhaler Commonly known as:  PROVENTIL HFA;VENTOLIN HFA Inhale 1-2 puffs into the lungs every 6 (six) hours as needed for wheezing or shortness of breath.   clindamycin 300 MG capsule Commonly known as:  CLEOCIN Take 1 capsule (300 mg total) by mouth 3 (three) times daily for 7 days. Start  this afternoon, take 1 tablet this afternoon and the next tablet in the evening.  And starting from tomorrow 1 tablet 3 times a day   lidocaine 2 % solution Commonly known as:  XYLOCAINE Use as directed 15 mLs in the mouth or throat every 4 (four) hours as needed for mouth pain (Gargle for throat pain). Do not swallow   predniSONE 20 MG tablet Commonly known as:   DELTASONE Take 2 tablets (40 mg total) by mouth daily with breakfast. What changed:  when to take this   prenatal multivitamin Tabs tablet Take 1 tablet by mouth daily at 12 noon.   sodium chloride 0.65 % Soln nasal spray Commonly known as:  OCEAN Place 1 spray into both nostrils as needed for up to 5 days for congestion.      Allergies  Allergen Reactions  . Iodine Other (See Comments)    blistering  . Sulfonamide Derivatives Other (  See Comments)    blistering   Follow-up Information    Graylin Shiver, MD. Schedule an appointment as soon as possible for a visit in 1 week(s).   Specialty:  Otolaryngology Contact information: [address] [place] [address] [place] [address] [phone]            The results of significant diagnostics from this hospitalization (including imaging, microbiology, ancillary and laboratory) are listed below for reference.    Significant Diagnostic Studies: No results found.  Microbiology: Recent Results (from the past 240 hour(s))  Culture, group A strep (throat)     Status: None   Collection Time: 02/23/18 12:23 PM  Result Value Ref Range Status   Specimen Description THROAT  Final   Special Requests   Final    NONE Performed at [facility], [address], [place], [place] [address]    Culture NO GROUP A STREP (S.PYOGENES) ISOLATED  Final   Report Status 02/26/2018 FINAL  Final  Wound or Superficial Culture     Status: None (Preliminary result)   Collection Time: 02/24/18 10:43 PM  Result Value Ref Range Status   Specimen Description   Final    TONSIL Performed at [facility], [address] [place], [place] [address]    Special Requests   Final    Normal Performed at [facility], [address], [place], [place] [address]    Gram Stain   Final    FEW WBC PRESENT, PREDOMINANTLY MONONUCLEAR ABUNDANT GRAM VARIABLE ROD ABUNDANT GRAM POSITIVE COCCI Performed at [facility], [address], [place], [place] [address]    Culture PENDING  Incomplete   Report Status PENDING  Incomplete     Labs: Basic Metabolic Panel: Recent Labs  Lab 02/26/18 0539  NA 135  K 3.9  CL 102  CO2 23  GLUCOSE 146*  BUN 7  CREATININE 0.61  CALCIUM 9.3   Liver Function Tests: No results for input(s): AST, ALT, ALKPHOS, BILITOT, PROT, ALBUMIN in the last 168 hours. No results for input(s): LIPASE, AMYLASE in the last 168 hours. No results for input(s): AMMONIA in the last 168 hours. CBC: Recent Labs  Lab 02/25/18 0251 02/26/18 0539  WBC 12.2* 13.6*  NEUTROABS 6.5 10.8*  HGB 12.0 12.9  HCT 35.1* 37.9  MCV 79.6* 80.1  PLT 348 360   Cardiac Enzymes: No results for input(s): CKTOTAL, CKMB, CKMBINDEX, TROPONINI in the last 168 hours. BNP: BNP (last 3 results) No results for input(s): BNP in the last 8760 hours.  ProBNP (last 3 results) No results for input(s): PROBNP in the last 8760 hours.  CBG: No results for input(s): GLUCAP in the last 168 hours.     Signed:  Myrtie Neither, MD [facility] 02/26/2018, 11:10 AM

## 2018-02-27 LAB — AEROBIC CULTURE W GRAM STAIN (SUPERFICIAL SPECIMEN): Special Requests: NORMAL

## 2018-02-27 LAB — AEROBIC CULTURE  (SUPERFICIAL SPECIMEN): CULTURE: NORMAL

## 2018-02-28 LAB — HEPATITIS B SURFACE ANTIGEN: Hepatitis B Surface Ag: NEGATIVE

## 2018-03-01 ENCOUNTER — Inpatient Hospital Stay (HOSPITAL_COMMUNITY): Payer: Medicaid Other

## 2018-03-01 ENCOUNTER — Other Ambulatory Visit: Payer: Self-pay

## 2018-03-01 ENCOUNTER — Encounter (HOSPITAL_COMMUNITY): Payer: Self-pay | Admitting: *Deleted

## 2018-03-01 ENCOUNTER — Inpatient Hospital Stay (HOSPITAL_COMMUNITY)
Admission: AD | Admit: 2018-03-01 | Discharge: 2018-03-01 | Disposition: A | Discharge status: Home / Self Care | Payer: Medicaid Other | Attending: Obstetrics & Gynecology | Admitting: Obstetrics & Gynecology

## 2018-03-01 DIAGNOSIS — O26891 Other specified pregnancy related conditions, first trimester: Secondary | ICD-10-CM

## 2018-03-01 DIAGNOSIS — Z87891 Personal history of nicotine dependence: Secondary | ICD-10-CM | POA: Diagnosis not present

## 2018-03-01 DIAGNOSIS — Z3A01 Less than 8 weeks gestation of pregnancy: Secondary | ICD-10-CM | POA: Insufficient documentation

## 2018-03-01 DIAGNOSIS — Z8614 Personal history of Methicillin resistant Staphylococcus aureus infection: Secondary | ICD-10-CM | POA: Insufficient documentation

## 2018-03-01 DIAGNOSIS — O98811 Other maternal infectious and parasitic diseases complicating pregnancy, first trimester: Secondary | ICD-10-CM | POA: Diagnosis not present

## 2018-03-01 DIAGNOSIS — B373 Candidiasis of vulva and vagina: Secondary | ICD-10-CM | POA: Diagnosis not present

## 2018-03-01 DIAGNOSIS — R109 Unspecified abdominal pain: Secondary | ICD-10-CM | POA: Diagnosis present

## 2018-03-01 DIAGNOSIS — O209 Hemorrhage in early pregnancy, unspecified: Secondary | ICD-10-CM | POA: Diagnosis not present

## 2018-03-01 LAB — WET PREP, GENITAL
Clue Cells Wet Prep HPF POC: NONE SEEN
Sperm: NONE SEEN
Trich, Wet Prep: NONE SEEN

## 2018-03-01 LAB — HCG, QUANTITATIVE, PREGNANCY: HCG, BETA CHAIN, QUANT, S: 197 m[IU]/mL — AB (ref <5)

## 2018-03-01 IMAGING — US US OB TRANSVAGINAL
1 series · 15 of 28 positions shown · non-contrast
Comparison: None for this gestation

CLINICAL DATA: Vaginal bleeding; EGA 5 weeks 2 days by LMP of
[DATE]; quantitative beta HCG = 197

EXAM:
TRANSVAGINAL OB ULTRASOUND
TECHNIQUE: Transvaginal ultrasound was performed for complete evaluation of the
gestation as well as the maternal uterus, adnexal regions, and
pelvic cul-de-sac.

[Series 1: us ob transvaginal · 15 of 34 slices shown]
[im 1/34]
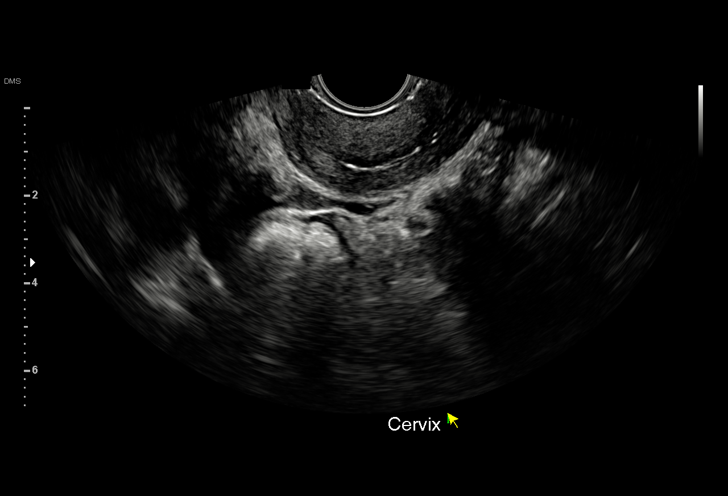
[im 3/34]
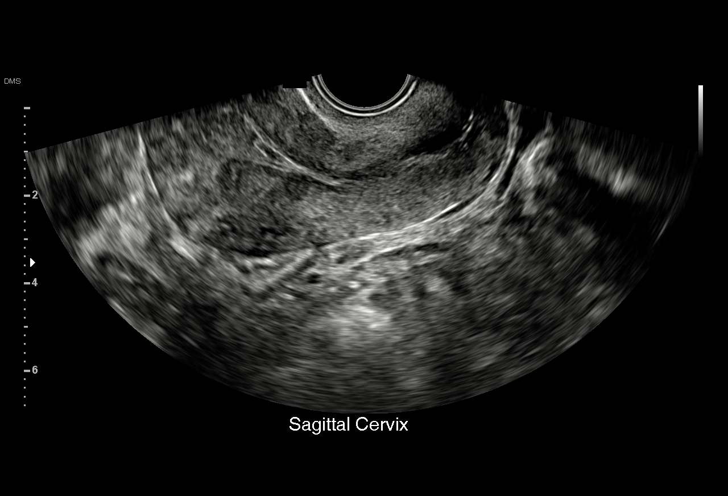
[im 5/34]
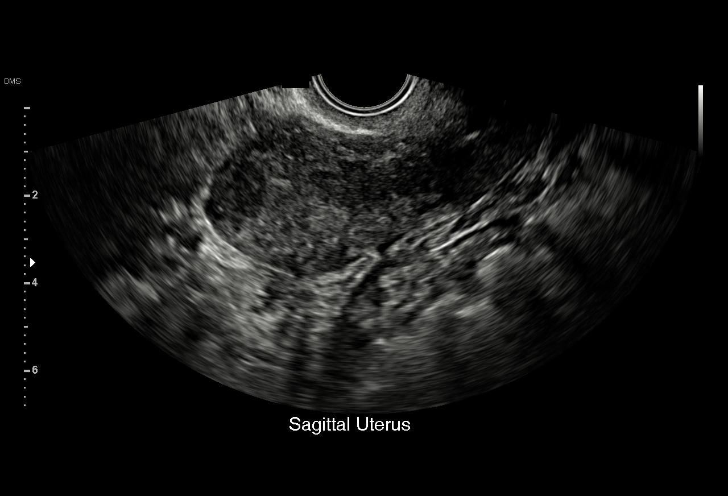
[im 8/34]
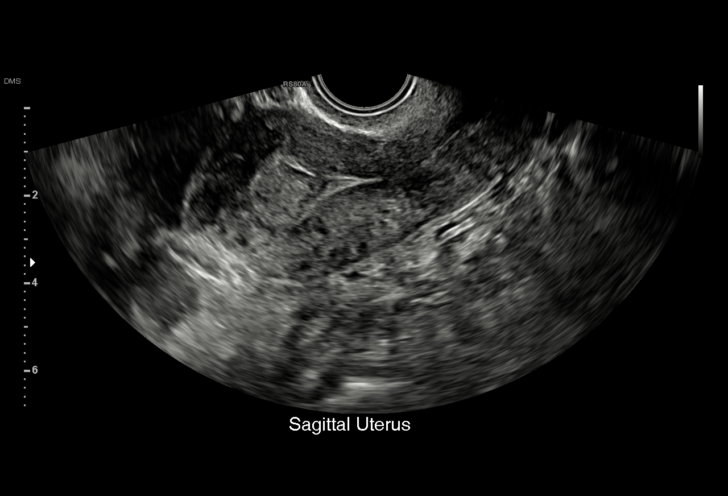
[im 10/34]
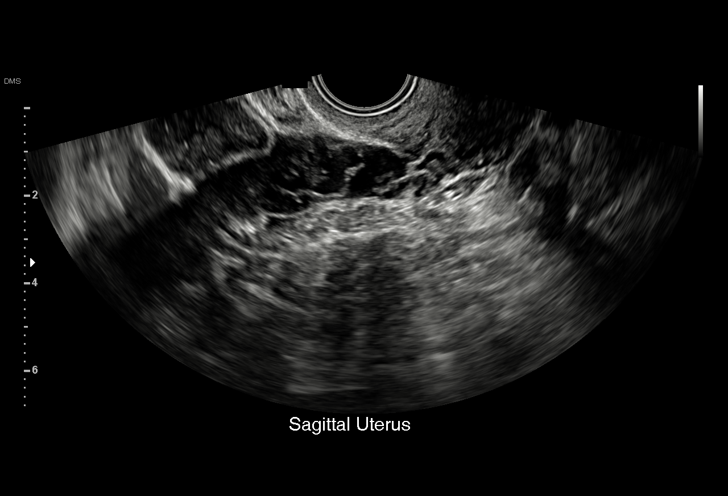
[im 13/34]
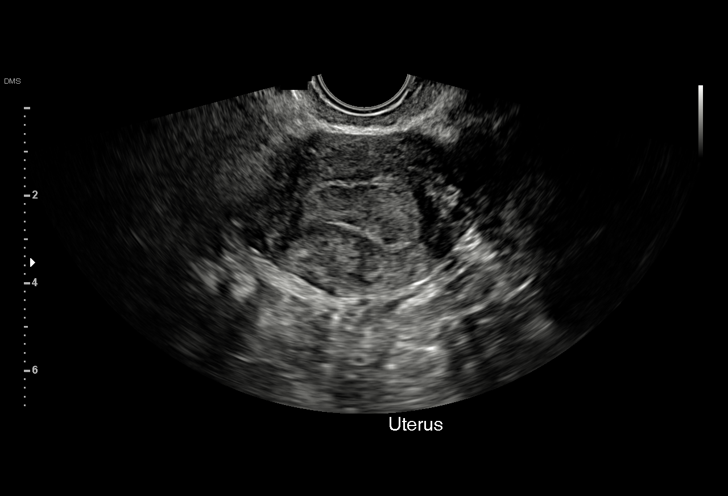
[im 15/34]
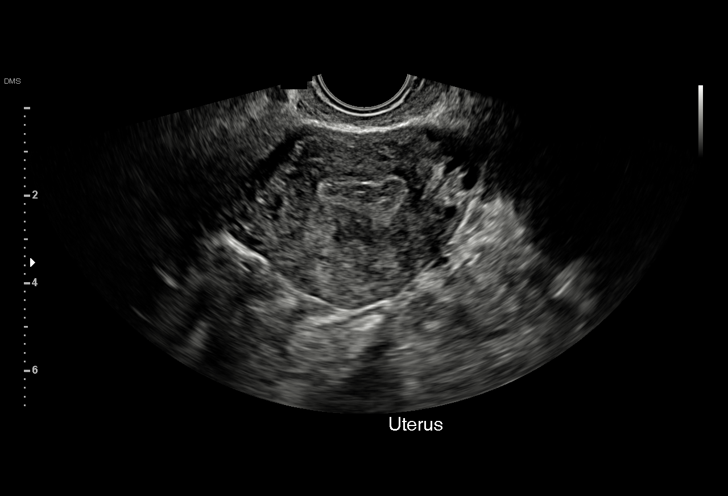
[im 18/34]
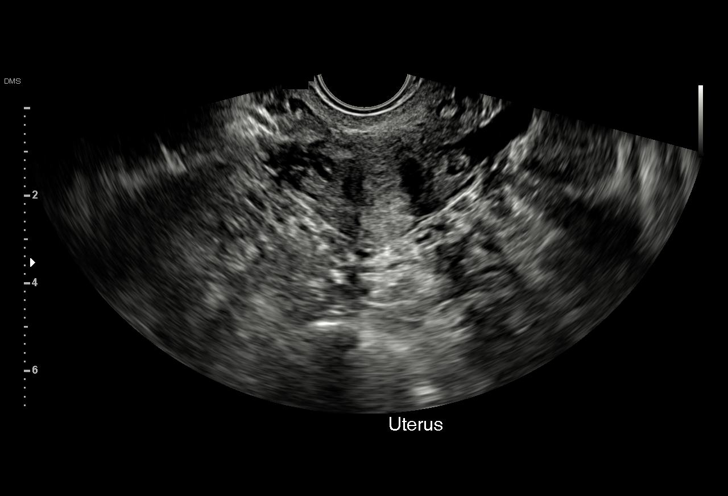
[im 19/34]
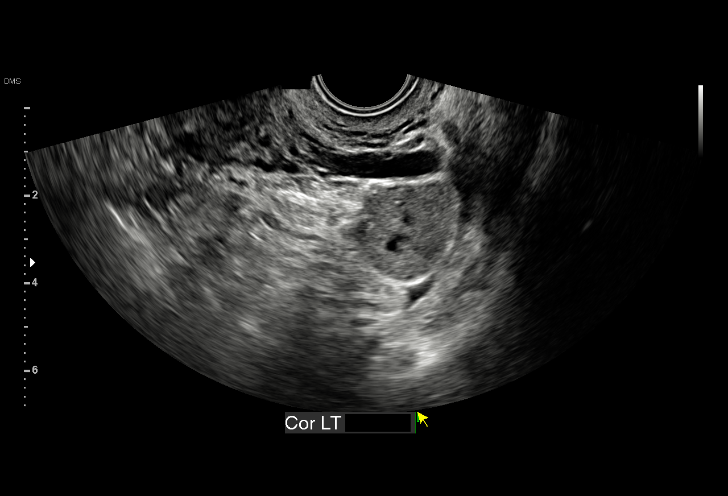
[im 21/34]
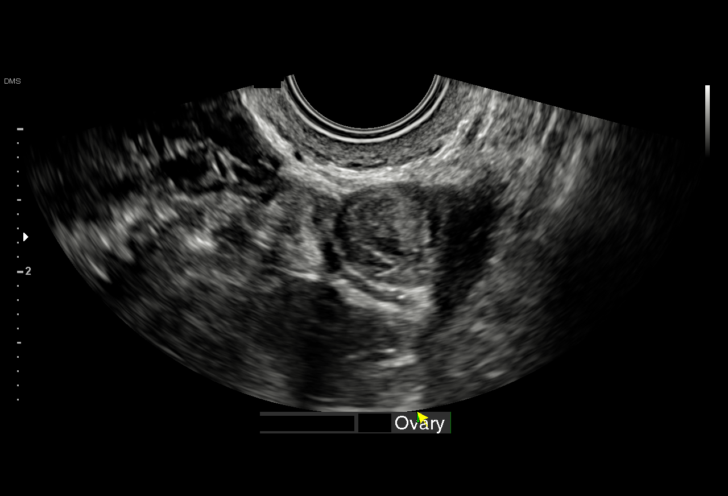
[im 24/34]
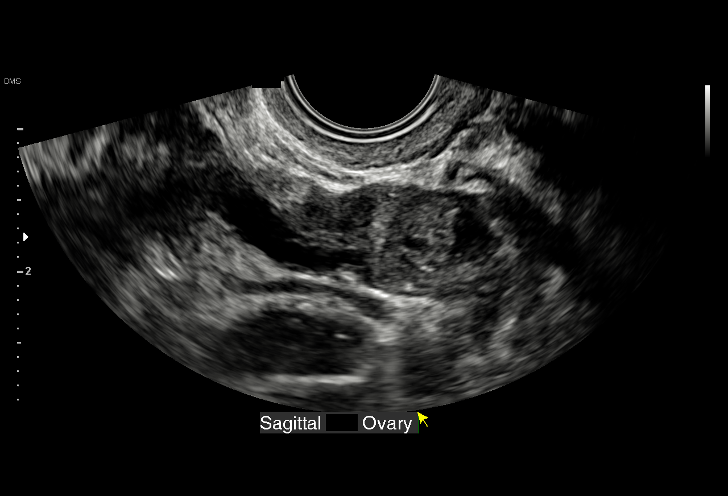
[im 26/34]
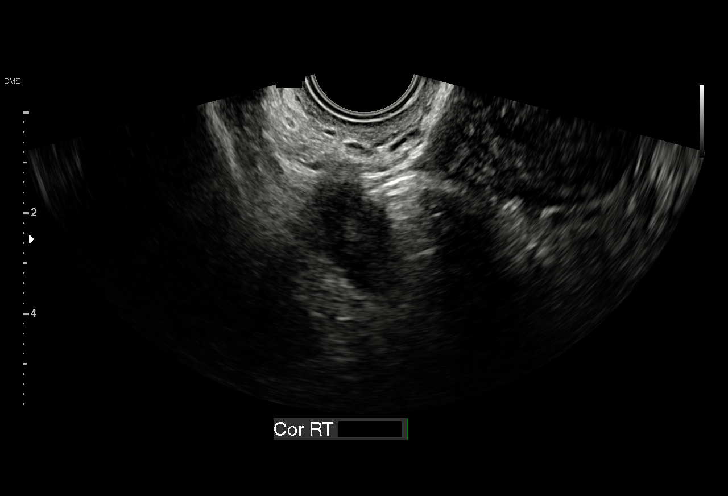
[im 29/34]
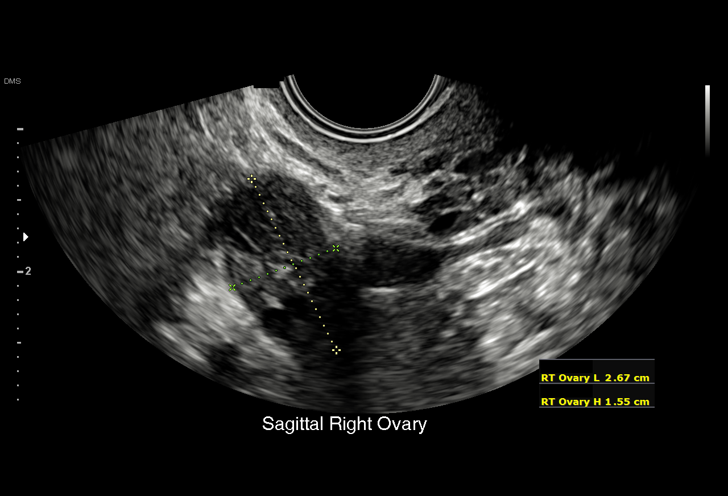
[im 31/34]
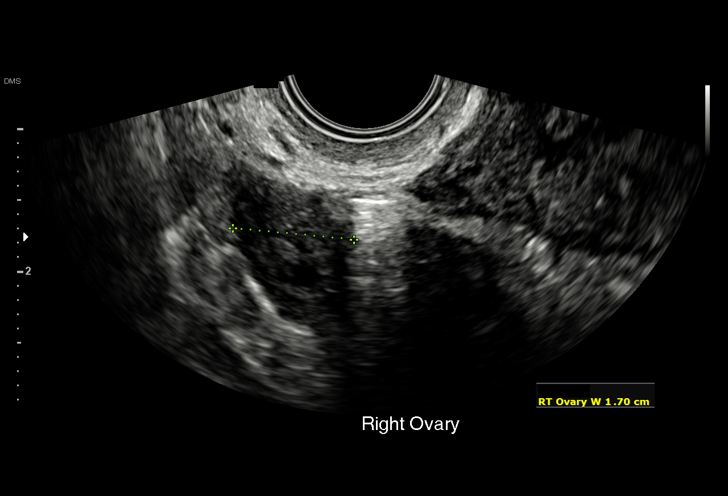
[im 34/34]
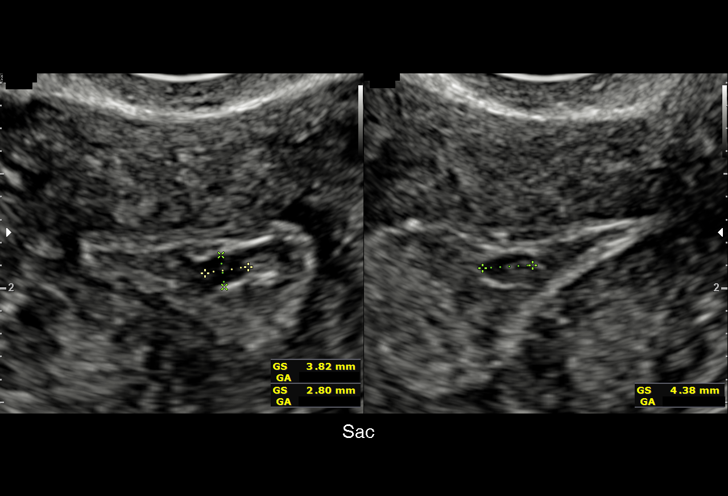

[15 of 28 positions shown; findings below may reference images not displayed]

FINDINGS: Intrauterine gestational sac: Questionable tiny gestational sac

Yolk sac:  Not identified

Embryo:  Not identified

Cardiac Activity: N/A

Heart Rate: N/A bpm

MSD: 3.7 mm   5 w   0 d

Subchorionic hemorrhage:  None visualized.

Maternal uterus/adnexae:

RIGHT ovary normal size morphology 1.7 x 2.7 x 1.6 cm.

LEFT ovary normal size and morphology, 1.7 x 2.5 x 1.6 cm.

Trace free pelvic fluid.

No adnexal masses.
IMPRESSION: Questionable tiny gestational sac within the uterus as above.

Otherwise negative pelvic ultrasound.

Consider follow-up ultrasound in 14 days to confirm viable
intrauterine pregnancy.

## 2018-03-01 MED ORDER — TERCONAZOLE 0.4 % VA CREA: 1.0000 | TOPICAL_CREAM | Freq: Every day | VAGINAL | 0 refills | Status: DC

## 2018-03-01 NOTE — MAU Note (Signed)
Yesterday was cramping, this morning woke up with bleeding. States periods have been irregular, unsure how far along she is.

## 2018-03-01 NOTE — Discharge Instructions (Signed)
Vaginal Bleeding During Pregnancy, First Trimester ° °A small amount of bleeding from the vagina (spotting) is relatively common during early pregnancy. It usually stops on its own. Various things may cause bleeding or spotting during early pregnancy. Some bleeding may be related to the pregnancy, and some may not. In many cases, the bleeding is normal and is not a problem. However, bleeding can also be a sign of something serious. Be sure to tell your health care provider about any vaginal bleeding right away. °Some possible causes of vaginal bleeding during the first trimester include: °· Infection or inflammation of the cervix. °· Growths (polyps) on the cervix. °· Miscarriage or threatened miscarriage. °· Pregnancy tissue developing outside of the uterus (ectopic pregnancy). °· A mass of tissue developing in the uterus due to an egg being fertilized incorrectly (molar pregnancy). °Follow these instructions at home: °Activity °· Follow instructions from your health care provider about limiting your activity. Ask what activities are safe for you. °· If needed, make plans for someone to help with your regular activities. °· Do not have sex or orgasms until your health care provider says that this is safe. °General instructions °· Take over-the-counter and prescription medicines only as told by your health care provider. °· Pay attention to any changes in your symptoms. °· Do not use tampons or douche. °· Write down how many pads you use each day, how often you change pads, and how soaked (saturated) they are. °· If you pass any tissue from your vagina, save the tissue so you can show it to your health care provider. °· Keep all follow-up visits as told by your health care provider. This is important. °Contact a health care provider if: °· You have vaginal bleeding during any part of your pregnancy. °· You have cramps or labor pains. °· You have a fever. °Get help right away if: °· You have severe cramps in your  back or abdomen. °· You pass large clots or a large amount of tissue from your vagina. °· Your bleeding increases. °· You feel light-headed or weak, or you faint. °· You have chills. °· You are leaking fluid or have a gush of fluid from your vagina. °Summary °· A small amount of bleeding (spotting) from the vagina is relatively common during early pregnancy. °· Various things may cause bleeding or spotting in early pregnancy. °· Be sure to tell your health care provider about any vaginal bleeding right away. °This information is not intended to replace advice given to you by your health care provider. Make sure you discuss any questions you have with your health care provider. °Document Released: 11/11/2004 Document Revised: 05/06/2016 Document Reviewed: 05/06/2016 °Elsevier Interactive Patient Education © 2019 [employer] ° °

## 2018-03-01 NOTE — MAU Provider Note (Addendum)
History     CSN: [id number]  Arrival date and time: 03/01/18 1147   First Provider Initiated Contact with Patient 03/01/18 1313      Chief Complaint  Patient presents with  . Abdominal Pain  . Vaginal Bleeding   Brittany [name] is a 28 y.o. [id number] at [redacted]w[redacted]d based on unsure LMP of 01/23/2018.  She presents today for Abdominal Pain and Vaginal Bleeding.  Patient reports that the cramping started about 3 days ago and has increased intensity with the onset of vaginal bleeding.  Patient states the bleeding started this morning and was bright red with small clots.  She states that she had to wear a sanitary napkin, but bleeding now appeared more brown than red.  Patient has not attempted any OTC medications for her cramping.  She states that this was an unplanned pregnancy and found out about the pregnancy during a recent ER visit for a tonsillar abscess. Patient denies sexual activity in the past 48 hours, problems with urination, or diarrhea.  She does report constipation and can not recall when she last had a bowel movement. Patient expresses a desire to complete her [facility], with Dr. Clearance Coots, if the pregnancy is found to be viable.      OB History    Gravida  5   Para  4   Term  4   Preterm      AB      Living  4     SAB      TAB      Ectopic      Multiple      Live Births  4           Past Medical History:  Diagnosis Date  . Asthma   . Chlamydia   . Gonorrhea   . Hx MRSA infection 2005  . NVD (normal vaginal delivery) 08/30/2010  . Trichimoniasis     Past Surgical History:  Procedure Laterality Date  . MULTIPLE TOOTH EXTRACTIONS    . NO PAST SURGERIES      Family History  Problem Relation Age of Onset  . Diabetes Maternal Grandmother   . Hypertension Father   . Heart disease Father   . Lung disease Father   . Anesthesia problems Neg Hx   . Other Neg Hx     Social History   Tobacco Use  . Smoking status: Former Smoker    Packs/day:  0.25    Years: 5.00    Pack years: 1.25    Types: Cigarettes    Last attempt to quit: 12/27/2012    Years since quitting: 5.1  . Smokeless tobacco: Never Used  Substance Use Topics  . Alcohol use: No  . Drug use: No    Allergies:  Allergies  Allergen Reactions  . Iodine Other (See Comments)    blistering  . Sulfonamide Derivatives Other (See Comments)    blistering    Medications Prior to Admission  Medication Sig Dispense Refill Last Dose  . acetaminophen (TYLENOL) 325 MG tablet Take 2 tablets (650 mg total) by mouth every 6 (six) hours as needed for moderate pain, fever or headache. 40 tablet 0   . albuterol (PROVENTIL HFA;VENTOLIN HFA) 108 (90 Base) MCG/ACT inhaler Inhale 1-2 puffs into the lungs every 6 (six) hours as needed for wheezing or shortness of breath.   02/24/2018 at Unknown time  . clindamycin (CLEOCIN) 300 MG capsule Take 1 capsule (300 mg total) by mouth 3 (three) times  daily for 7 days. Start  this afternoon, take 1 tablet this afternoon and the next tablet in the evening.  And starting from tomorrow 1 tablet 3 times a day 21 capsule 0   . lidocaine (XYLOCAINE) 2 % solution Use as directed 15 mLs in the mouth or throat every 4 (four) hours as needed for mouth pain (Gargle for throat pain). Do not swallow 100 mL 0   . predniSONE (DELTASONE) 20 MG tablet Take 2 tablets (40 mg total) by mouth daily with breakfast. 8 tablet 0   . Prenatal Vit-Fe Fumarate-FA (PRENATAL MULTIVITAMIN) TABS tablet Take 1 tablet by mouth daily at 12 noon. 30 tablet 0   . sodium chloride (OCEAN) 0.65 % SOLN nasal spray Place 1 spray into both nostrils as needed for up to 5 days for congestion. 30 mL 0     Review of Systems  Constitutional: Negative for chills and fever.  Gastrointestinal: Positive for nausea. Negative for abdominal pain, diarrhea and vomiting.  Genitourinary: Positive for vaginal bleeding. Negative for dysuria, urgency, vaginal discharge and vaginal pain.  Neurological:  Negative for dizziness, light-headedness and headaches.   Physical Exam   Blood pressure 139/87, pulse 81, temperature 98 F (36.7 C), temperature source Oral, resp. rate 17, height 5\' 4"  (1.626 m), weight 97.8 kg, last menstrual period 01/23/2018, SpO2 99 %.   Results for orders placed or performed during the hospital encounter of 03/01/18 (from the past 24 hour(s))  hCG, quantitative, pregnancy     Status: Abnormal   Collection Time: 03/01/18  1:04 PM  Result Value Ref Range   hCG, Beta Chain, Quant, S 197 (H) <5 mIU/mL  Wet prep, genital     Status: Abnormal   Collection Time: 03/01/18  1:12 PM  Result Value Ref Range   Yeast Wet Prep HPF POC PRESENT (A) NONE SEEN   Trich, Wet Prep NONE SEEN NONE SEEN   Clue Cells Wet Prep HPF POC NONE SEEN NONE SEEN   WBC, Wet Prep HPF POC FEW (A) NONE SEEN   Sperm NONE SEEN     Physical Exam  Constitutional: She is oriented to person, place, and time. She appears well-developed and well-nourished.  HENT:  Head: Normocephalic and atraumatic.  Eyes: Conjunctivae are normal.  Neck: Normal range of motion.  Cardiovascular: Normal rate, regular rhythm and normal heart sounds.  Respiratory: Effort normal and breath sounds normal.  GI: Soft. Bowel sounds are normal.  Genitourinary: Uterus is not enlarged. Cervix exhibits no motion tenderness and no discharge.    Vaginal tenderness and bleeding present.     No vaginal discharge.  There is tenderness and bleeding in the vagina.    Genitourinary Comments: Speculum Exam: -Vaginal Vault: Pink mucosa. Moderate amt brownish red discharge in vault -wet prep collected -Cervix: Pink, no lesions, cysts, or polyps.  Appears closed. No active bleeding from os-GC/CT collected -Bimanual Exam: Tenderness in posterior aspect of cervix.  Uterus size difficult to assess. Cervix closed.   Musculoskeletal: Normal range of motion.  Neurological: She is alert and oriented to person, place, and time.  Skin: Skin is  warm and dry.  Psychiatric: She has a normal mood and affect. Her behavior is normal.    MAU Course  Procedures US FINDINGS: Intrauterine gestational sac: Questionable tiny gestational sac  Yolk sac:  Not identified  Embryo:  Not identified  Cardiac Activity: N/A  Heart Rate: N/A bpm  MSD: 3.7 mm   5 w   0 d  Subchorionic  hemorrhage:  None visualized.  Maternal uterus/adnexae:  RIGHT ovary normal size morphology 1.7 x 2.7 x 1.6 cm.  LEFT ovary normal size and morphology, 1.7 x 2.5 x 1.6 cm.  Trace free pelvic fluid.  No adnexal masses.  IMPRESSION: Questionable tiny gestational sac within the uterus as above.  Otherwise negative pelvic ultrasound.  Consider follow-up ultrasound in 14 days to confirm viable intrauterine pregnancy.   MDM Pelvic Exam; Wet Prep and GC/CT Labs: UA, UPT, hCG Ultrasound  Assessment and Plan  IUP at 4.6wks  Vaginal Bleeding  -Exam findings discussed. -Reassurances that no active bleeding from cervix, but confirmed dark brown bloody discharge in vault. -Wet prep pending -Labs Pending  -Will send to [place] and review all results upon return   Follow Up (2:34 PM) Vaginal Candidiasis  Early Pregnancy   -Wet prep return significant for yeast infection. -Rx for Terazol 0.4% sent to pharmacy on file. -[place] returns as above and patient informed of need for follow up. -Will place order for outpatient [place] in ~2weeks. -Discussed pelvic rest until at least 2 days after discontinuation of bleeding. -Bleeding Precautions Given -Discussed that Dr. Clearance Coots is available to provider [facility], but deliveries would be performed by CNM staff or other MD.   -Patient verbalizes understanding and w/o q/c. -Encouraged to call or return to MAU if symptoms worsen or with the onset of new symptoms. -Discharged to home in stable condition  Cherre Robins MSN, CNM 03/01/2018, 1:13 PM   Addendum (7:53 PM) -After review of algorithm, patient  contacted and identity verified. -Informed of need for serial quants prior to repeat US. -Scheduled for Lab visit at 0900 on Friday Jan 17th. -Patient agreeable and without q/c.    Cherre Robins MSN, CNM 7:54 PM

## 2018-03-01 NOTE — MAU Note (Signed)
Urine sent to lab 

## 2018-03-02 LAB — GC/CHLAMYDIA PROBE AMP (~~LOC~~) NOT AT ARMC
Chlamydia: NEGATIVE
Neisseria Gonorrhea: NEGATIVE

## 2018-03-03 ENCOUNTER — Ambulatory Visit (INDEPENDENT_AMBULATORY_CARE_PROVIDER_SITE_OTHER): Payer: Self-pay

## 2018-03-03 ENCOUNTER — Encounter: Payer: Self-pay | Admitting: Family Medicine

## 2018-03-03 DIAGNOSIS — O3680X0 Pregnancy with inconclusive fetal viability, not applicable or unspecified: Secondary | ICD-10-CM

## 2018-03-03 LAB — HCG, QUANTITATIVE, PREGNANCY: hCG, Beta Chain, Quant, S: 183 m[IU]/mL — ABNORMAL HIGH (ref <5)

## 2018-03-03 NOTE — Progress Notes (Signed)
Pt here today for STAT Beta Lab.  Pt denies any pain or bleeding today.  Pt informed that she will be here for at least two hours for results and f/u.  Pt verbalized understanding.    Notified Dr. Alysia Penna, pt's results.  Provider recommendation to have her come back in 5 days for rpt non-stat beta.  Notified pt of providers recommendation to make sure that if she starts to have increase pain or bleeding to go to MAU.  Pt verbalized understanding.  Addison Naegeli, RN

## 2018-03-06 NOTE — Progress Notes (Signed)
Agree with A & P. 

## 2018-03-07 ENCOUNTER — Other Ambulatory Visit: Payer: Self-pay | Admitting: *Deleted

## 2018-03-07 DIAGNOSIS — O3680X0 Pregnancy with inconclusive fetal viability, not applicable or unspecified: Secondary | ICD-10-CM

## 2018-03-08 ENCOUNTER — Other Ambulatory Visit: Payer: Self-pay

## 2018-03-08 DIAGNOSIS — O3680X0 Pregnancy with inconclusive fetal viability, not applicable or unspecified: Secondary | ICD-10-CM

## 2018-03-09 ENCOUNTER — Telehealth: Payer: Self-pay | Admitting: *Deleted

## 2018-03-09 LAB — BETA HCG QUANT (REF LAB): hCG Quant: 19 m[IU]/mL

## 2018-03-09 NOTE — Telephone Encounter (Signed)
-----   Message from Hermina StaggersMichael L Ervin, MD sent at 03/09/2018  8:57 AM EST ----- Repeat BHCG non stat in 2 weeks Thanks Casimiro NeedleMichael

## 2018-03-09 NOTE — Telephone Encounter (Signed)
Called pt to inform her that her bhcg labs are decreasing like we expect them to and that the provider wants her to have another lab drawn in 2 weeks.  Informed pt that she would be contacted by front office staff to schedule that appointment.  Pt verbalized understanding.  Pt declined to have provider visit to discuss birth control.

## 2018-03-22 ENCOUNTER — Other Ambulatory Visit: Payer: Self-pay | Admitting: *Deleted

## 2018-03-22 DIAGNOSIS — O3680X0 Pregnancy with inconclusive fetal viability, not applicable or unspecified: Secondary | ICD-10-CM

## 2018-03-23 ENCOUNTER — Other Ambulatory Visit: Payer: Self-pay

## 2018-09-29 ENCOUNTER — Other Ambulatory Visit: Payer: Self-pay

## 2018-09-29 ENCOUNTER — Emergency Department
Admission: EM | Admit: 2018-09-29 | Discharge: 2018-09-29 | Disposition: A | Discharge status: Home / Self Care | Payer: Medicaid Other | Attending: Emergency Medicine | Admitting: Emergency Medicine

## 2018-09-29 ENCOUNTER — Encounter: Payer: Self-pay | Admitting: Emergency Medicine

## 2018-09-29 DIAGNOSIS — O26891 Other specified pregnancy related conditions, first trimester: Secondary | ICD-10-CM | POA: Diagnosis present

## 2018-09-29 DIAGNOSIS — Z3A01 Less than 8 weeks gestation of pregnancy: Secondary | ICD-10-CM | POA: Diagnosis not present

## 2018-09-29 DIAGNOSIS — K047 Periapical abscess without sinus: Secondary | ICD-10-CM | POA: Diagnosis not present

## 2018-09-29 DIAGNOSIS — Z87891 Personal history of nicotine dependence: Secondary | ICD-10-CM | POA: Insufficient documentation

## 2018-09-29 DIAGNOSIS — J45909 Unspecified asthma, uncomplicated: Secondary | ICD-10-CM | POA: Diagnosis not present

## 2018-09-29 LAB — PREGNANCY, URINE: Preg Test, Ur: POSITIVE — AB

## 2018-09-29 LAB — HCG, QUANTITATIVE, PREGNANCY: hCG, Beta Chain, Quant, S: 5356 m[IU]/mL — ABNORMAL HIGH (ref <5)

## 2018-09-29 LAB — POCT PREGNANCY, URINE: Preg Test, Ur: POSITIVE — AB

## 2018-09-29 MED ORDER — AMOXICILLIN 500 MG PO TABS: 500.0000 mg | ORAL_TABLET | Freq: Three times a day (TID) | ORAL | 0 refills | Status: DC

## 2018-09-29 NOTE — ED Notes (Signed)
Called lab and spoke with Nimisha and they will add on hcG quant

## 2018-09-29 NOTE — ED Provider Notes (Signed)
[facility] Emergency Department Provider Note ____________________________________________  Time seen: Approximately 12:24 PM  I have reviewed the triage vital signs and the nursing notes.   HISTORY  Chief Complaint Dental Pain   HPI Brittany [name] is a 28 y.o. female who presents to the emergency department for treatment and evaluation of dental abscess. She has had multiple dental issues in the past. She awakened 2-3 days ago with facial swelling. She also believes she is pregnant and wants a blood test.  Past Medical History:  Diagnosis Date  . Asthma   . Chlamydia   . Gonorrhea   . Hx MRSA infection 2005  . NVD (normal vaginal delivery) 08/30/2010  . Trichimoniasis     Patient Active Problem List   Diagnosis Date Noted  . Peritonsillar abscess 02/25/2018  . Asthma 02/25/2018  . Status post normal vaginal delivery 09/05/2013  . Other specified indication for care or intervention related to labor and delivery, unspecified as to episode of care 09/03/2013  . Pregnant 12/01/2011  . Elevated blood pressure complicating pregnancy, antepartum 12/01/2011    Past Surgical History:  Procedure Laterality Date  . MULTIPLE TOOTH EXTRACTIONS    . NO PAST SURGERIES      Prior to Admission medications   Medication Sig Start Date End Date Taking? Authorizing Provider  albuterol (PROVENTIL HFA;VENTOLIN HFA) 108 (90 Base) MCG/ACT inhaler Inhale 1-2 puffs into the lungs every 6 (six) hours as needed for wheezing or shortness of breath.    [provider]  amoxicillin (AMOXIL) 500 MG tablet Take 1 tablet (500 mg total) by mouth 3 (three) times daily. 09/29/18   Devereaux Grayson, Rulon Eisenmengerari B, FNP  terconazole (TERAZOL 7) 0.4 % vaginal cream Place 1 applicator vaginally at bedtime. 03/01/18   Gerrit HeckEmly, Jessica, CNM    Allergies Iodine and Sulfonamide derivatives  Family History  Problem Relation Age of Onset  . Diabetes Maternal Grandmother   . Hypertension  Father   . Heart disease Father   . Lung disease Father   . Anesthesia problems Neg Hx   . Other Neg Hx     Social History Social History   Tobacco Use  . Smoking status: Former Smoker    Packs/day: 0.25    Years: 5.00    Pack years: 1.25    Types: Cigarettes    Quit date: 12/27/2012    Years since quitting: 5.7  . Smokeless tobacco: Never Used  Substance Use Topics  . Alcohol use: No  . Drug use: No    Review of Systems Constitutional: Negative for fever or recent illness. ENT: Positive for dental pain. Musculoskeletal: Negative for trismus of the jaw.  Skin: Negative for wound or lesion. ____________________________________________   PHYSICAL EXAM:  VITAL SIGNS: ED Triage Vitals  Enc Vitals Group     BP 09/29/18 1106 113/64     Pulse Rate 09/29/18 1106 84     Resp 09/29/18 1106 18     Temp 09/29/18 1106 98.5 F (36.9 C)     Temp Source 09/29/18 1106 Oral     SpO2 09/29/18 1106 96 %     Weight 09/29/18 1107 222 lb (100.7 kg)     Height 09/29/18 1107 5\' 5"  (1.651 m)     Head Circumference --      Peak Flow --      Pain Score 09/29/18 1109 6     Pain Loc --      Pain Edu? --  Excl. in [place]? --     Constitutional: Alert and oriented. Well appearing and in no acute distress. Eyes: Conjunctiva are clear without discharge or drainage. Mouth/Throat: Widespread dental decay in very poor oral hygiene Periodontal Exam    Hematological/Lymphatic/Immunilogical: No anterior cervical lymphadenopathy. Respiratory: Respirations even and unlabored. Musculoskeletal: Full ROM of the jaw. Neurologic: Awake, alert, oriented.  Skin: No facial swelling noted Psychiatric: Affect and behavior intact.  ____________________________________________   LABS (all labs ordered are listed, but only abnormal results are displayed)  Labs Reviewed  PREGNANCY, URINE - Abnormal; Notable for the following components:      Result Value   Preg Test, Ur POSITIVE (*)    All other  components within normal limits  HCG, QUANTITATIVE, PREGNANCY - Abnormal; Notable for the following components:   hCG, Beta Chain, Quant, S 5,356 (*)    All other components within normal limits  POCT PREGNANCY, URINE - Abnormal; Notable for the following components:   Preg Test, Ur POSITIVE (*)    All other components within normal limits  POC URINE PREG, ED   ____________________________________________   RADIOLOGY  Not indicated. ____________________________________________   PROCEDURES  Procedure(s) performed:   Procedures  Critical Care performed: No ____________________________________________   INITIAL IMPRESSION / ASSESSMENT AND PLAN / ED COURSE  Brittany [name] is a 28 y.o. female presenting to the emergency department for treatment and evaluation of dental pain and requesting a pregnancy test.  For the dental pain, she will be treated with amoxicillin and advised to take Tylenol if needed for pain.  Pregnancy test is positive.  She is unsure when her last menstrual cycle was since she is so irregular.  She is a gravida para 4 1 spontaneous abortion January. She is to follow-up with the OB/GYN of her choice.  She was also given information for [facility] dental clinic and was advised to go ahead and call to schedule an appointment.  Pertinent labs & imaging results that were available during my care of the patient were reviewed by me and considered in my medical decision making (see chart for details).  ____________________________________________   FINAL CLINICAL IMPRESSION(S) / ED DIAGNOSES  Final diagnoses:  Dental abscess  Less than [redacted] weeks gestation of pregnancy    New Prescriptions   AMOXICILLIN (AMOXIL) 500 MG TABLET    Take 1 tablet (500 mg total) by mouth 3 (three) times daily.    If controlled substance prescribed during this visit, 12 month history viewed on the [facility] prior to issuing an initial prescription for Schedule II or III opiod.   Note:  This document was prepared using Dragon voice recognition software and may include unintentional dictation errors.   Victorino Dike, FNP 09/29/18 1450    Delman Kitten, MD 09/29/18 [phone]

## 2018-09-29 NOTE — ED Notes (Signed)
Green and lav sent to lab 

## 2018-09-29 NOTE — ED Triage Notes (Signed)
Presents with possible dental abscess   States she noticed swelling to gumline couple of days ago  Also thinks that she might be pregnant

## 2018-09-29 NOTE — Discharge Instructions (Signed)
Schedule an appointment with the OB GYN of your choice. Begin prenatal vitamins.  Follow up with the dentist as well.  Return to the ER for symptoms that change or worsen if unable to schedule an appointment.

## 2018-10-13 ENCOUNTER — Telehealth: Payer: Self-pay | Admitting: Obstetrics and Gynecology

## 2018-10-13 NOTE — Telephone Encounter (Signed)
Pt is coming in 10/17/18 for nurse intake . Pt is experiencing severe nausea unable to keep fluid and solid food down. Pt is also experiencing side/kidney pain, and feeling light headed at times. Pt would like to speak to a nurse. Please advise.

## 2018-10-13 NOTE — Telephone Encounter (Signed)
LM for patient to return call.

## 2018-10-16 NOTE — Telephone Encounter (Signed)
LM for patient to return call.

## 2018-10-19 ENCOUNTER — Other Ambulatory Visit: Payer: Self-pay

## 2018-10-19 ENCOUNTER — Ambulatory Visit (INDEPENDENT_AMBULATORY_CARE_PROVIDER_SITE_OTHER): Payer: Medicaid Other | Admitting: Obstetrics and Gynecology

## 2018-10-19 ENCOUNTER — Other Ambulatory Visit (INDEPENDENT_AMBULATORY_CARE_PROVIDER_SITE_OTHER): Payer: Medicaid Other

## 2018-10-19 VITALS — BP 86/65 | HR 74 | Ht 65.0 in | Wt 223.8 lb

## 2018-10-19 DIAGNOSIS — Z3481 Encounter for supervision of other normal pregnancy, first trimester: Secondary | ICD-10-CM

## 2018-10-19 DIAGNOSIS — Z113 Encounter for screening for infections with a predominantly sexual mode of transmission: Secondary | ICD-10-CM

## 2018-10-19 DIAGNOSIS — Z0283 Encounter for blood-alcohol and blood-drug test: Secondary | ICD-10-CM

## 2018-10-19 DIAGNOSIS — R638 Other symptoms and signs concerning food and fluid intake: Secondary | ICD-10-CM

## 2018-10-19 MED ORDER — PROMETHAZINE HCL 25 MG PO TABS: 25.0000 mg | ORAL_TABLET | Freq: Four times a day (QID) | ORAL | 1 refills | Status: DC | PRN

## 2018-10-19 MED ORDER — BONJESTA 20-20 MG PO TBCR: 20.0000 mg | EXTENDED_RELEASE_TABLET | Freq: Two times a day (BID) | ORAL | 2 refills | Status: DC | PRN

## 2018-10-19 NOTE — Patient Instructions (Signed)
WHAT OB PATIENTS CAN EXPECT   Confirmation of pregnancy and ultrasound ordered if medically indicated-[redacted] weeks gestation  New OB (NOB) intake with nurse and New OB (NOB) labs- [redacted] weeks gestation  New OB (NOB) physical examination with provider- 11/[redacted] weeks gestation  Flu vaccine-[redacted] weeks gestation  Anatomy scan-[redacted] weeks gestation  Glucose tolerance test, blood work to test for anemia, T-dap vaccine-[redacted] weeks gestation  Vaginal swabs/cultures-STD/Group B strep-[redacted] weeks gestation  Appointments every 4 weeks until 28 weeks  Every 2 weeks from 28 weeks until 36 weeks  Weekly visits from 36 weeks until delivery  Prenatal Care Prenatal care is health care during pregnancy. It helps you and your unborn baby (fetus) stay as healthy as possible. Prenatal care may be provided by a midwife, a family practice health care provider, or a childbirth and pregnancy specialist (obstetrician). How does this affect me? During pregnancy, you will be closely monitored for any new conditions that might develop. To lower your risk of pregnancy complications, you and your health care provider will talk about any underlying conditions you have. How does this affect my baby? Early and consistent prenatal care increases the chance that your baby will be healthy during pregnancy. Prenatal care lowers the risk that your baby will be:  Born early (prematurely).  Smaller than expected at birth (small for gestational age). What can I expect at the first prenatal care visit? Your first prenatal care visit will likely be the longest. You should schedule your first prenatal care visit as soon as you know that you are pregnant. Your first visit is a good time to talk about any questions or concerns you have about pregnancy. At your visit, you and your health care provider will talk about:  Your medical history, including: ? Any past pregnancies. ? Your family's medical history. ? The baby's father's medical  history. ? Any long-term (chronic) health conditions you have and how you manage them. ? Any surgeries or procedures you have had. ? Any current over-the-counter or prescription medicines, herbs, or supplements you are taking.  Other factors that could pose a risk to your baby, including:  Your home setting and your stress levels, including: ? Exposure to abuse or violence. ? Household financial strain. ? Mental health conditions you have.  Your daily health habits, including diet and exercise. Your health care provider will also:  Measure your weight, height, and blood pressure.  Do a physical exam, including a pelvic and breast exam.  Perform blood tests and urine tests to check for: ? Urinary tract infection. ? Sexually transmitted infections (STIs). ? Low iron levels in your blood (anemia). ? Blood type and certain proteins on red blood cells (Rh antibodies). ? Infections and immunity to viruses, such as hepatitis B and rubella. ? HIV (human immunodeficiency virus).  Do an ultrasound to confirm your baby's growth and development and to help predict your estimated due date (EDD). This ultrasound is done with a probe that is inserted into the vagina (transvaginal ultrasound).  Discuss your options for genetic screening.  Give you information about how to keep yourself and your baby healthy, including: ? Nutrition and taking vitamins. ? Physical activity. ? How to manage pregnancy symptoms such as nausea and vomiting (morning sickness). ? Infections and substances that may be harmful to your baby and how to avoid them. ? Food safety. ? Dental care. ? Working. ? Travel. ? Warning signs to watch for and when to call your health care provider. How often will  I have prenatal care visits? After your first prenatal care visit, you will have regular visits throughout your pregnancy. The visit schedule is often as follows:  Up to week 28 of pregnancy: once every 4 weeks.  28-36  weeks: once every 2 weeks.  After 36 weeks: every week until delivery. Some women may have visits more or less often depending on any underlying health conditions and the health of the baby. Keep all follow-up and prenatal care visits as told by your health care provider. This is important. What happens during routine prenatal care visits? Your health care provider will:  Measure your weight and blood pressure.  Check for fetal heart sounds.  Measure the height of your uterus in your abdomen (fundal height). This may be measured starting around week 20 of pregnancy.  Check the position of your baby inside your uterus.  Ask questions about your diet, sleeping patterns, and whether you can feel the baby move.  Review warning signs to watch for and signs of labor.  Ask about any pregnancy symptoms you are having and how you are dealing with them. Symptoms may include: ? Headaches. ? Nausea and vomiting. ? Vaginal discharge. ? Swelling. ? Fatigue. ? Constipation. ? Any discomfort, including back or pelvic pain. Make a list of questions to ask your health care provider at your routine visits. What tests might I have during prenatal care visits? You may have blood, urine, and imaging tests throughout your pregnancy, such as:  Urine tests to check for glucose, protein, or signs of infection.  Glucose tests to check for a form of diabetes that can develop during pregnancy (gestational diabetes mellitus). This is usually done around week 24 of pregnancy.  An ultrasound to check your baby's growth and development and to check for birth defects. This is usually done around week 20 of pregnancy.  A test to check for group B strep (GBS) infection. This is usually done around week 36 of pregnancy.  Genetic testing. This may include blood or imaging tests, such as an ultrasound. Some genetic tests are done during the first trimester and some are done during the second trimester. What else  can I expect during prenatal care visits? Your health care provider may recommend getting certain vaccines during pregnancy. These may include:  A yearly flu shot (annual influenza vaccine). This is especially important if you will be pregnant during flu season.  Tdap (tetanus, diphtheria, pertussis) vaccine. Getting this vaccine during pregnancy can protect your baby from whooping cough (pertussis) after birth. This vaccine may be recommended between weeks 27 and 36 of pregnancy. Later in your pregnancy, your health care provider may give you information about:  Childbirth and breastfeeding classes.  Choosing a health care provider for your baby.  Umbilical cord banking.  Breastfeeding.  Birth control after your baby is born.  The hospital labor and delivery unit and how to tour it.  Registering at the hospital before you go into labor. Where to find more information  Office on Women's Health: LegalWarrants.gl  American Pregnancy Association: americanpregnancy.org  March of Dimes: marchofdimes.org Summary  Prenatal care helps you and your baby stay as healthy as possible during pregnancy.  Your first prenatal care visit will most likely be the longest.  You will have visits and tests throughout your pregnancy to monitor your health and your baby's health.  Bring a list of questions to your visits to ask your health care provider.  Make sure to keep all follow-up and prenatal  care visits with your health care provider. This information is not intended to replace advice given to you by your health care provider. Make sure you discuss any questions you have with your health care provider. Document Released: 02/04/2003 Document Revised: 05/24/2018 Document Reviewed: 01/31/2017 Elsevier Patient Education  [place]. Morning Sickness  Morning sickness is when you feel sick to your stomach (nauseous) during pregnancy. You may feel sick to your stomach and throw up  (vomit). You may feel sick in the morning, but you can feel this way at any time of day. Some women feel very sick to their stomach and cannot stop throwing up (hyperemesis gravidarum). Follow these instructions at home: Medicines  Take over-the-counter and prescription medicines only as told by your doctor. Do not take any medicines until you talk with your doctor about them first.  Taking multivitamins before getting pregnant can stop or lessen the harshness of morning sickness. Eating and drinking  Eat dry toast or crackers before getting out of bed.  Eat 5 or 6 small meals a day.  Eat dry and bland foods like rice and baked potatoes.  Do not eat greasy, fatty, or spicy foods.  Have someone cook for you if the smell of food causes you to feel sick or throw up.  If you feel sick to your stomach after taking prenatal vitamins, take them at night or with a snack.  Eat protein when you need a snack. Nuts, yogurt, and cheese are good choices.  Drink fluids throughout the day.  Try ginger ale made with real ginger, ginger tea made from fresh grated ginger, or ginger candies. General instructions  Do not use any products that have nicotine or tobacco in them, such as cigarettes and e-cigarettes. If you need help quitting, ask your doctor.  Use an air purifier to keep the air in your house free of smells.  Get lots of fresh air.  Try to avoid smells that make you feel sick.  Try: ? Wearing a bracelet that is used for seasickness (acupressure wristband). ? Going to a doctor who puts thin needles into certain body points (acupuncture) to improve how you feel. Contact a doctor if:  You need medicine to feel better.  You feel dizzy or light-headed.  You are losing weight. Get help right away if:  You feel very sick to your stomach and cannot stop throwing up.  You pass out (faint).  You have very bad pain in your belly. Summary  Morning sickness is when you feel sick to  your stomach (nauseous) during pregnancy.  You may feel sick in the morning, but you can feel this way at any time of day.  Making some changes to what you eat may help your symptoms go away. This information is not intended to replace advice given to you by your health care provider. Make sure you discuss any questions you have with your health care provider. Document Released: 03/11/2004 Document Revised: 01/14/2017 Document Reviewed: 03/04/2016 Elsevier Patient Education  2020 [employer]. How a Baby Grows During Pregnancy  Pregnancy begins when a female's sperm enters a female's egg (fertilization). Fertilization usually happens in one of the tubes (fallopian tubes) that connect the ovaries to the womb (uterus). The fertilized egg moves down the fallopian tube to the uterus. Once it reaches the uterus, it implants into the lining of the uterus and begins to grow. For the first 10 weeks, the fertilized egg is called an embryo. After 10 weeks, it  is called a fetus. As the fetus continues to grow, it receives oxygen and nutrients through tissue (placenta) that grows to support the developing baby. The placenta is the life support system for the baby. It provides oxygen and nutrition and removes waste. Learning as much as you can about your pregnancy and how your baby is developing can help you enjoy the experience. It can also make you aware of when there might be a problem and when to ask questions. How long does a typical pregnancy last? A pregnancy usually lasts 280 days, or about 40 weeks. Pregnancy is divided into three periods of growth, also called trimesters:  First trimester: 0-12 weeks.  Second trimester: 13-27 weeks.  Third trimester: 28-40 weeks. The day when your baby is ready to be born (full term) is your estimated date of delivery. How does my baby develop month by month? First month  The fertilized egg attaches to the inside of the uterus.  Some cells will form the  placenta. Others will form the fetus.  The arms, legs, brain, spinal cord, lungs, and heart begin to develop.  At the end of the first month, the heart begins to beat. Second month  The bones, inner ear, eyelids, hands, and feet form.  The genitals develop.  By the end of 8 weeks, all major organs are developing. Third month  All of the internal organs are forming.  Teeth develop below the gums.  Bones and muscles begin to grow. The spine can flex.  The skin is transparent.  Fingernails and toenails begin to form.  Arms and legs continue to grow longer, and hands and feet develop.  The fetus is about 3 inches (7.6 cm) long. Fourth month  The placenta is completely formed.  The external sex organs, neck, outer ear, eyebrows, eyelids, and fingernails are formed.  The fetus can hear, swallow, and move its arms and legs.  The kidneys begin to produce urine.  The skin is covered with a white, waxy coating (vernix) and very fine hair (lanugo). Fifth month  The fetus moves around more and can be felt for the first time (quickening).  The fetus starts to sleep and wake up and may begin to suck its finger.  The nails grow to the end of the fingers.  The organ in the digestive system that makes bile (gallbladder) functions and helps to digest nutrients.  If your baby is a girl, eggs are present in her ovaries. If your baby is a boy, testicles start to move down into his scrotum. Sixth month  The lungs are formed.  The eyes open. The brain continues to develop.  Your baby has fingerprints and toe prints. Your baby's hair grows thicker.  At the end of the second trimester, the fetus is about 9 inches (22.9 cm) long. Seventh month  The fetus kicks and stretches.  The eyes are developed enough to sense changes in light.  The hands can make a grasping motion.  The fetus responds to sound. Eighth month  All organs and body systems are fully developed and  functioning.  Bones harden, and taste buds develop. The fetus may hiccup.  Certain areas of the brain are still developing. The skull remains soft. Ninth month  The fetus gains about  lb (0.23 kg) each week.  The lungs are fully developed.  Patterns of sleep develop.  The fetus's head typically moves into a head-down position (vertex) in the uterus to prepare for birth.  The fetus weighs  6-9 lb (2.72-4.08 kg) and is 19-20 inches (48.26-50.8 cm) long. What can I do to have a healthy pregnancy and help my baby develop? General instructions  Take prenatal vitamins as directed by your health care provider. These include vitamins such as folic acid, iron, calcium, and vitamin D. They are important for healthy development.  Take medicines only as directed by your health care provider. Read labels and ask a pharmacist or your health care provider whether over-the-counter medicines, supplements, and prescription drugs are safe to take during pregnancy.  Keep all follow-up visits as directed by your health care provider. This is important. Follow-up visits include prenatal care and screening tests. How do I know if my baby is developing well? At each prenatal visit, your health care provider will do several different tests to check on your health and keep track of your baby's development. These include:  Fundal height and position. ? Your health care provider will measure your growing belly from your pubic bone to the top of the uterus using a tape measure. ? Your health care provider will also feel your belly to determine your baby's position.  Heartbeat. ? An ultrasound in the first trimester can confirm pregnancy and show a heartbeat, depending on how far along you are. ? Your health care provider will check your baby's heart rate at every prenatal visit.  Second trimester ultrasound. ? This ultrasound checks your baby's development. It also may show your baby's gender. What should I  do if I have concerns about my baby's development? Always talk with your health care provider about any concerns that you may have about your pregnancy and your baby. Summary  A pregnancy usually lasts 280 days, or about 40 weeks. Pregnancy is divided into three periods of growth, also called trimesters.  Your health care provider will monitor your baby's growth and development throughout your pregnancy.  Follow your health care provider's recommendations about taking prenatal vitamins and medicines during your pregnancy.  Talk with your health care provider if you have any concerns about your pregnancy or your developing baby. This information is not intended to replace advice given to you by your health care provider. Make sure you discuss any questions you have with your health care provider. Document Released: 07/21/2007 Document Revised: 05/25/2018 Document Reviewed: 12/15/2016 Elsevier Patient Education  2020 [place] of Pregnancy  The first trimester of pregnancy is from week 1 until the end of week 13 (months 1 through 3). During this time, your baby will begin to develop inside you. At 6-8 weeks, the eyes and face are formed, and the heartbeat can be seen on ultrasound. At the end of 12 weeks, all the baby's organs are formed. Prenatal care is all the medical care you receive before the birth of your baby. Make sure you get good prenatal care and follow all of your doctor's instructions. Follow these instructions at home: Medicines  Take over-the-counter and prescription medicines only as told by your doctor. Some medicines are safe and some medicines are not safe during pregnancy.  Take a prenatal vitamin that contains at least 600 micrograms (mcg) of folic acid.  If you have trouble pooping (constipation), take medicine that will make your stool soft (stool softener) if your doctor approves. Eating and drinking   Eat regular, healthy meals.  Your doctor  will tell you the amount of weight gain that is right for you.  Avoid raw meat and uncooked cheese.  If you feel sick to  your stomach (nauseous) or throw up (vomit): ? Eat 4 or 5 small meals a day instead of 3 large meals. ? Try eating a few soda crackers. ? Drink liquids between meals instead of during meals.  To prevent constipation: ? Eat foods that are high in fiber, like fresh fruits and vegetables, whole grains, and beans. ? Drink enough fluids to keep your pee (urine) clear or pale yellow. Activity  Exercise only as told by your doctor. Stop exercising if you have cramps or pain in your lower belly (abdomen) or low back.  Do not exercise if it is too hot, too humid, or if you are in a place of great height (high altitude).  Try to avoid standing for long periods of time. Move your legs often if you must stand in one place for a long time.  Avoid heavy lifting.  Wear low-heeled shoes. Sit and stand up straight.  You can have sex unless your doctor tells you not to. Relieving pain and discomfort  Wear a good support bra if your breasts are sore.  Take warm water baths (sitz baths) to soothe pain or discomfort caused by hemorrhoids. Use hemorrhoid cream if your doctor says it is okay.  Rest with your legs raised if you have leg cramps or low back pain.  If you have puffy, bulging veins (varicose veins) in your legs: ? Wear support hose or compression stockings as told by your doctor. ? Raise (elevate) your feet for 15 minutes, 3-4 times a day. ? Limit salt in your food. Prenatal care  Schedule your prenatal visits by the twelfth week of pregnancy.  Write down your questions. Take them to your prenatal visits.  Keep all your prenatal visits as told by your doctor. This is important. Safety  Wear your seat belt at all times when driving.  Make a list of emergency phone numbers. The list should include numbers for family, friends, the hospital, and police and fire  departments. General instructions  Ask your doctor for a referral to a local prenatal class. Begin classes no later than at the start of month 6 of your pregnancy.  Ask for help if you need counseling or if you need help with nutrition. Your doctor can give you advice or tell you where to go for help.  Do not use hot tubs, steam rooms, or saunas.  Do not douche or use tampons or scented sanitary pads.  Do not cross your legs for long periods of time.  Avoid all herbs and alcohol. Avoid drugs that are not approved by your doctor.  Do not use any tobacco products, including cigarettes, chewing tobacco, and electronic cigarettes. If you need help quitting, ask your doctor. You may get counseling or other support to help you quit.  Avoid cat litter boxes and soil used by cats. These carry germs that can cause birth defects in the baby and can cause a loss of your baby (miscarriage) or stillbirth.  Visit your dentist. At home, brush your teeth with a soft toothbrush. Be gentle when you floss. Contact a doctor if:  You are dizzy.  You have mild cramps or pressure in your lower belly.  You have a nagging pain in your belly area.  You continue to feel sick to your stomach, you throw up, or you have watery poop (diarrhea).  You have a bad smelling fluid coming from your vagina.  You have pain when you pee (urinate).  You have increased puffiness (swelling) in your  face, hands, legs, or ankles. Get help right away if:  You have a fever.  You are leaking fluid from your vagina.  You have spotting or bleeding from your vagina.  You have very bad belly cramping or pain.  You gain or lose weight rapidly.  You throw up blood. It may look like coffee grounds.  You are around people who have [place] measles, fifth disease, or chickenpox.  You have a very bad headache.  You have shortness of breath.  You have any kind of trauma, such as from a fall or a car  accident. Summary  The first trimester of pregnancy is from week 1 until the end of week 13 (months 1 through 3).  To take care of yourself and your unborn baby, you will need to eat healthy meals, take medicines only if your doctor tells you to do so, and do activities that are safe for you and your baby.  Keep all follow-up visits as told by your doctor. This is important as your doctor will have to ensure that your baby is healthy and growing well. This information is not intended to replace advice given to you by your health care provider. Make sure you discuss any questions you have with your health care provider. Document Released: 07/21/2007 Document Revised: 05/25/2018 Document Reviewed: 02/10/2016 Elsevier Patient Education  2020 [employer]. Commonly Asked Questions During Pregnancy  Cats: A parasite can be excreted in cat feces.  To avoid exposure you need to have another person empty the little box.  If you must empty the litter box you will need to wear gloves.  Wash your hands after handling your cat.  This parasite can also be found in raw or undercooked meat so this should also be avoided.  Colds, Sore Throats, Flu: Please check your medication sheet to see what you can take for symptoms.  If your symptoms are unrelieved by these medications please call the office.  Dental Work: Most any dental work [employer] recommends is permitted.  X-rays should only be taken during the first trimester if absolutely necessary.  Your abdomen should be shielded with a lead apron during all x-rays.  Please notify your provider prior to receiving any x-rays.  Novocaine is fine; gas is not recommended.  If your dentist requires a note from [place] prior to dental work please call the office and we will provide one for you.  Exercise: Exercise is an important part of staying healthy during your pregnancy.  You may continue most exercises you were accustomed to prior to pregnancy.  Later in your  pregnancy you will most likely notice you have difficulty with activities requiring balance like riding a bicycle.  It is important that you listen to your body and avoid activities that put you at a higher risk of falling.  Adequate rest and staying well hydrated are a must!  If you have questions about the safety of specific activities ask your provider.    Exposure to Children with illness: Try to avoid obvious exposure; report any symptoms to [place] when noted,  If you have chicken pos, red measles or mumps, you should be immune to these diseases.   Please do not take any vaccines while pregnant unless you have checked with your OB provider.  Fetal Movement: After 28 weeks we recommend you do "kick counts" twice daily.  Lie or sit down in a calm quiet environment and count your baby movements "kicks".  You should feel your baby  should feel your baby at least 10 times per hour.  If you have not felt 10 kicks within the first hour get up, walk around and have something sweet to eat or drink then repeat for an additional hour.  If count remains less than 10 per hour notify your provider.  Fumigating: Follow your pest control agent's advice as to how long to stay out of your home.  Ventilate the area well before re-entering.  Hemorrhoids:   Most over-the-counter preparations can be used during pregnancy.  Check your medication to see what is safe to use.  It is important to use a stool softener or fiber in your diet and to drink lots of liquids.  If hemorrhoids seem to be getting worse please call the office.   Hot Tubs:  Hot tubs Jacuzzis and saunas are not recommended while pregnant.  These increase your internal body temperature and should be avoided.  Intercourse:  Sexual intercourse is safe during pregnancy as long as you are comfortable, unless otherwise advised by your provider.  Spotting may occur after intercourse; report any bright red bleeding that is heavier than spotting.  Labor:  If you know that you are in labor,  please go to the hospital.  If you are unsure, please call the office and let us help you decide what to do.  Lifting, straining, etc:  If your job requires heavy lifting or straining please check with your provider for any limitations.  Generally, you should not lift items heavier than that you can lift simply with your hands and arms (no back muscles)  Painting:  Paint fumes do not harm your pregnancy, but may make you ill and should be avoided if possible.  Latex or water based paints have less odor than oils.  Use adequate ventilation while painting.  Permanents & Hair Color:  Chemicals in hair dyes are not recommended as they cause increase hair dryness which can increase hair loss during pregnancy.  " Highlighting" and permanents are allowed.  Dye may be absorbed differently and permanents may not hold as well during pregnancy.  Sunbathing:  Use a sunscreen, as skin burns easily during pregnancy.  Drink plenty of fluids; avoid over heating.  Tanning Beds:  Because their possible side effects are still unknown, tanning beds are not recommended.  Ultrasound Scans:  Routine ultrasounds are performed at approximately 20 weeks.  You will be able to see your baby's general anatomy an if you would like to know the gender this can usually be determined as well.  If it is questionable when you conceived you may also receive an ultrasound early in your pregnancy for dating purposes.  Otherwise ultrasound exams are not routinely performed unless there is a medical necessity.  Although you can request a scan we ask that you pay for it when conducted because insurance does not cover " patient request" scans.  Work: If your pregnancy proceeds without complications you may work until your due date, unless your physician or employer advises otherwise.  Round Ligament Pain/Pelvic Discomfort:  Sharp, shooting pains not associated with bleeding are fairly common, usually occurring in the second trimester of  pregnancy.  They tend to be worse when standing up or when you remain standing for long periods of time.  These are the result of pressure of certain pelvic ligaments called "round ligaments".  Rest, Tylenol and heat seem to be the most effective relief.  As the womb and fetus grow, they rise out of the pelvis   Please notify the office if your pain seems different than that described.  It may represent a more serious condition.  Common Medications Safe in Pregnancy  Acne:      Constipation:  Benzoyl Peroxide     Colace  Clindamycin      Dulcolax Suppository  Topica Erythromycin     Fibercon  Salicylic Acid      Metamucil         Miralax AVOID:        Senakot   Accutane    Cough:  Retin-A       Cough Drops  Tetracycline      Phenergan w/ Codeine if Rx  Minocycline      Robitussin (Plain & DM)  Antibiotics:     Crabs/Lice:  Ceclor       RID  Cephalosporins    AVOID:  E-Mycins      Kwell  Keflex  Macrobid/Macrodantin   Diarrhea:  Penicillin      Kao-Pectate  Zithromax      Imodium AD         PUSH FLUIDS AVOID:       Cipro     Fever:  Tetracycline      Tylenol (Regular or Extra  Minocycline       Strength)  Levaquin      Extra Strength-Do not          Exceed 8 tabs/24 hrs Caffeine:        <258m/day (equiv. To 1 cup of coffee or  approx. 3 12 oz sodas)         Gas: Cold/Hayfever:       Gas-X  Benadryl      Mylicon  Claritin       Phazyme  **Claritin-D        Chlor-Trimeton    Headaches:  Dimetapp      ASA-Free Excedrin  Drixoral-Non-Drowsy     Cold Compress  Mucinex (Guaifenasin)     Tylenol (Regular or Extra  Sudafed/Sudafed-12 Hour     Strength)  **Sudafed PE Pseudoephedrine   Tylenol Cold & Sinus     Vicks Vapor Rub  Zyrtec  **AVOID if Problems With Blood Pressure         Heartburn: Avoid lying down for at least 1 hour after meals  Aciphex      Maalox     Rash:  Milk of Magnesia     Benadryl    Mylanta       1% Hydrocortisone  Cream  Pepcid  Pepcid Complete   Sleep Aids:  Prevacid      Ambien   Prilosec       Benadryl  Rolaids       Chamomile Tea  Tums (Limit 4/day)     Unisom  Zantac       Tylenol PM         Warm milk-add vanilla or  Hemorrhoids:       Sugar for taste  Anusol/Anusol H.C.  (RX: Analapram 2.5%)  Sugar Substitutes:  Hydrocortisone OTC     Ok in moderation  Preparation H      Tucks        Vaseline lotion applied to tissue with wiping    Herpes:     Throat:  Acyclovir      Oragel  Famvir  Valtrex     Vaccines:         Flu Shot Leg Cramps:       *Gardasil  Benadryl      Hepatitis A         Hepatitis B Nasal Spray:       Pneumovax  Saline Nasal Spray     Polio Booster         Tetanus Nausea:       Tuberculosis test or PPD  Vitamin B6 25 mg TID   AVOID:    Dramamine      *Gardasil  Emetrol       Live Poliovirus  Ginger Root 250 mg QID    MMR (measles, mumps &  High Complex Carbs @ Bedtime    rebella)  Sea Bands-Accupressure    Varicella (Chickenpox)  Unisom 1/2 tab TID     *No known complications           If received before Pain:         Known pregnancy;   Darvocet       Resume series after  Lortab        Delivery  Percocet    Yeast:   Tramadol      Femstat  Tylenol 3      Gyne-lotrimin  Ultram       Monistat  Vicodin           MISC:         All Sunscreens           Hair Coloring/highlights          Insect Repellant's          (Including DEET)         Mystic Tans

## 2018-10-19 NOTE — Progress Notes (Signed)
      Brittany [name] presents for NOB nurse intake visit. Pregnancy confirmation done at [facility] ED,  09/29/18, with Margaretann Loveless.  G6.  P 4014.  LMP 08/25/18.  EDD 06/01/19.  Ga [redacted]w[redacted]d. Pregnancy education material explained and given.  0 cats in the home.  NOB labs ordered. BMI greater than 30. TSH/HbgA1c ordered. Sickle cell order due to race. HIV and drug screen explained and ordered. Genetic screening discussed. Genetic testing; Unsure. Pt to discuss genetic testing with provider. PNV encouraged. Pt to follow up with provider in 4 weeks for NOB physical.    BP (!) 86/65   Pulse 74   Ht 5\' 5"  (1.651 m)   Wt 223 lb 12.8 oz (101.5 kg)   LMP 08/25/2018 Comment: short only 1-2 days  Breastfeeding Unknown   BMI 37.24 kg/m

## 2018-10-20 LAB — ABO AND RH: Rh Factor: POSITIVE

## 2018-10-20 LAB — TOXOPLASMA ANTIBODIES- IGG AND  IGM
Toxoplasma Antibody- IgM: <3 AU/mL (ref 0.0–7.9)
Toxoplasma IgG Ratio: <3 IU/mL (ref 0.0–7.1)

## 2018-10-20 LAB — ANTIBODY SCREEN: Antibody Screen: NEGATIVE

## 2018-10-20 LAB — HEPATITIS B SURFACE ANTIGEN: Hepatitis B Surface Ag: NEGATIVE

## 2018-10-20 LAB — RPR: RPR Ser Ql: NONREACTIVE

## 2018-10-20 LAB — HIV ANTIBODY (ROUTINE TESTING W REFLEX): HIV Screen 4th Generation wRfx: NONREACTIVE

## 2018-10-20 LAB — RUBELLA SCREEN: Rubella Antibodies, IGG: 1.46 index (ref >0.99)

## 2018-10-20 LAB — VARICELLA ZOSTER ANTIBODY, IGG: Varicella zoster IgG: 2019 index (ref >165)

## 2018-10-20 LAB — HEMOGLOBIN A1C
Est. average glucose Bld gHb Est-mCnc: 103 mg/dL
Hgb A1c MFr Bld: 5.2 % (ref 4.8–5.6)

## 2018-10-20 LAB — TSH: TSH: 1.19 u[IU]/mL (ref 0.450–4.500)

## 2018-10-20 LAB — HGB SOLU + RFLX FRAC: Sickle Solubility Test - HGBRFX: NEGATIVE

## 2018-11-09 ENCOUNTER — Other Ambulatory Visit: Payer: Self-pay

## 2018-11-09 ENCOUNTER — Emergency Department
Admission: EM | Admit: 2018-11-09 | Discharge: 2018-11-09 | Disposition: A | Discharge status: Home / Self Care | Payer: Medicaid Other | Attending: Emergency Medicine | Admitting: Emergency Medicine

## 2018-11-09 ENCOUNTER — Encounter: Payer: Self-pay | Admitting: Emergency Medicine

## 2018-11-09 DIAGNOSIS — J45909 Unspecified asthma, uncomplicated: Secondary | ICD-10-CM | POA: Insufficient documentation

## 2018-11-09 DIAGNOSIS — Z87891 Personal history of nicotine dependence: Secondary | ICD-10-CM | POA: Insufficient documentation

## 2018-11-09 DIAGNOSIS — L0291 Cutaneous abscess, unspecified: Secondary | ICD-10-CM

## 2018-11-09 DIAGNOSIS — L0231 Cutaneous abscess of buttock: Secondary | ICD-10-CM | POA: Diagnosis not present

## 2018-11-09 MED ORDER — CLINDAMYCIN HCL 300 MG PO CAPS: 300.0000 mg | ORAL_CAPSULE | Freq: Three times a day (TID) | ORAL | 0 refills | Status: AC

## 2018-11-09 MED ORDER — LIDOCAINE HCL (PF) 1 % IJ SOLN
5.0000 mL | Freq: Once | INTRAMUSCULAR | Status: AC
  Administered 2018-11-09: 5 mL via INTRADERMAL
  Filled 2018-11-09: qty 5

## 2018-11-09 NOTE — ED Notes (Signed)
See triage note  Presents with possible abscess area to buttocks  States she noticed a small area about 1 week ago  Has been using warm compresses but area not any better

## 2018-11-09 NOTE — ED Provider Notes (Signed)
[facility] Emergency Department Provider Note  ____________________________________________  Time seen: Approximately 10:44 AM  I have reviewed the triage vital signs and the nursing notes.   HISTORY  Chief Complaint Abscess    HPI Brittany [name] is a 28 y.o. female G3P4 that presents to the emergency department for evaluation of abscess to left buttocks this week.  Patient is [redacted] weeks pregnant.  She follows up with obstetrics next week.  No vaginal bleeding, abdominal pain.  She has no pregnancy concerns today.  She has had abscesses in the past.  Abscess is not draining.  No fevers.   Past Medical History:  Diagnosis Date  . Asthma   . Chlamydia   . Gonorrhea   . Hx MRSA infection 2005  . NVD (normal vaginal delivery) 08/30/2010  . Trichimoniasis     Patient Active Problem List   Diagnosis Date Noted  . Peritonsillar abscess 02/25/2018  . Asthma 02/25/2018  . Status post normal vaginal delivery 09/05/2013  . Other specified indication for care or intervention related to labor and delivery, unspecified as to episode of care 09/03/2013  . Pregnant 12/01/2011  . Elevated blood pressure complicating pregnancy, antepartum 12/01/2011    Past Surgical History:  Procedure Laterality Date  . MULTIPLE TOOTH EXTRACTIONS    . NO PAST SURGERIES      Prior to Admission medications   Medication Sig Start Date End Date Taking? Authorizing Provider  Prenatal Multivit-Min-Fe-FA (PRE-NATAL PO) Take by mouth.   Yes [provider]  albuterol (PROVENTIL HFA;VENTOLIN HFA) 108 (90 Base) MCG/ACT inhaler Inhale 1-2 puffs into the lungs every 6 (six) hours as needed for wheezing or shortness of breath.    [provider]  clindamycin (CLEOCIN) 300 MG capsule Take 1 capsule (300 mg total) by mouth 3 (three) times daily for 10 days. 11/09/18 11/19/18  Laban Emperor, PA-C  Doxylamine-Pyridoxine ER (BONJESTA) 20-20 MG TBCR Take 20 mg by mouth 2  (two) times daily as needed. 10/19/18   Harlin Heys, MD    Allergies Iodine and Sulfonamide derivatives  Family History  Problem Relation Age of Onset  . Diabetes Maternal Grandmother   . Hypertension Father   . Heart disease Father   . Lung disease Father   . Anesthesia problems Neg Hx   . Other Neg Hx     Social History Social History   Tobacco Use  . Smoking status: Former Smoker    Packs/day: 0.25    Years: 5.00    Pack years: 1.25    Types: Cigarettes    Quit date: 12/27/2012    Years since quitting: 5.8  . Smokeless tobacco: Never Used  Substance Use Topics  . Alcohol use: No  . Drug use: Not Currently    Types: Marijuana     Review of Systems  Constitutional: No fever/chills Cardiovascular: No chest pain. Respiratory: No SOB. Gastrointestinal: No abdominal pain.  No nausea, no vomiting.  Musculoskeletal: Negative for musculoskeletal pain. Skin: Negative for rash, abrasions, lacerations, ecchymosis.   ____________________________________________   PHYSICAL EXAM:  VITAL SIGNS: ED Triage Vitals  Enc Vitals Group     BP 11/09/18 1040 106/75     Pulse Rate 11/09/18 1040 100     Resp 11/09/18 1040 16     Temp 11/09/18 1040 97.8 F (36.6 C)     Temp Source 11/09/18 1040 Oral     SpO2 11/09/18 1040 99 %     Weight 11/09/18 1033 223 lb (101.2  kg)     Height 11/09/18 1033 5\' 5"  (1.651 m)     Head Circumference --      Peak Flow --      Pain Score 11/09/18 1033 10     Pain Loc --      Pain Edu? --      Excl. in GC? --      Constitutional: Alert and oriented. Well appearing and in no acute distress. Eyes: Conjunctivae are normal. PERRL. EOMI. Head: Atraumatic. ENT:      Ears:      Nose: No congestion/rhinnorhea.      Mouth/Throat: Mucous membranes are moist.  Neck: No stridor.   Cardiovascular: Normal rate, regular rhythm.  Good peripheral circulation. Respiratory: Normal respiratory effort without tachypnea or retractions. Lungs CTAB.  Good air entry to the bases with no decreased or absent breath sounds. Musculoskeletal: Full range of motion to all extremities. No gross deformities appreciated.  Neurologic:  Normal speech and language. No gross focal neurologic deficits are appreciated.  Skin:  Skin is warm, dry and intact.  1 cm x 1 cm area of swelling and fluctuance to left buttocks between vaginal opening and rectum.  Abscess does not extend into vaginal opening or rectum. Psychiatric: Mood and affect are normal. Speech and behavior are normal. Patient exhibits appropriate insight and judgement.   ____________________________________________   LABS (all labs ordered are listed, but only abnormal results are displayed)  Labs Reviewed - No data to display ____________________________________________  EKG   ____________________________________________  RADIOLOGY   No results found.  ____________________________________________    PROCEDURES  Procedure(s) performed:    Procedures  INCISION AND DRAINAGE Performed by: Enid DerryAshley Kimberlye Dilger Consent: Verbal consent obtained. Risks and benefits: risks, benefits and alternatives were discussed Type: abscess  Body area: left buttocks  Anesthesia: local infiltration  Incision was made with a scalpel.  Local anesthetic: lidocaine 1 % without epinephrine  Anesthetic total: 2 ml  Complexity: complex Blunt dissection to break up loculations  Drainage: purulent  Drainage amount: moderate  Packing material: 1/4 in iodoform gauze  Patient tolerance: Patient tolerated the procedure well with no immediate complications.     Medications  lidocaine (PF) (XYLOCAINE) 1 % injection 5 mL (5 mLs Intradermal Given by Other 11/09/18 1118)     ____________________________________________   INITIAL IMPRESSION / ASSESSMENT AND PLAN / ED COURSE  Pertinent labs & imaging results that were available during my care of the patient were reviewed by me and considered  in my medical decision making (see chart for details).  Review of the [place] CSRS was performed in accordance of the [facility] prior to dispensing any controlled drugs.     Patient's diagnosis is consistent with abscess.  Vital signs and exam are reassuring.  Risk and benefits of incision and drainage were discussed with patient.  She elects to have abscess drained.  Abscess is close but does not extend into rectum or sphincter.  Abscess was drained as distal to the sphincter as possible.  Abscess was packed in the emergency department.  Antibiotics during pregnancy were discussed with patient.  Clindamycin is rated as a category B in pregnancy.  Patient has no pregnancy concerns.  She follows up with obstetrics next week.  No abdominal pain, vaginal bleeding.  Patient will be discharged home with prescriptions for clindamycin. Patient is to follow up with obstetrics and emergency department as directed. Patient is given ED precautions to return to the ED for any worsening or new symptoms.  Brittany [name] was evaluated in Emergency Department on 11/09/2018 for the symptoms described in the history of present illness. She was evaluated in the context of the global COVID-19 pandemic, which necessitated consideration that the patient might be at risk for infection with the SARS-CoV-2 virus that causes COVID-19. Institutional protocols and algorithms that pertain to the evaluation of patients at risk for COVID-19 are in a state of rapid change based on information released by regulatory bodies including the CDC and federal and state organizations. These policies and algorithms were followed during the patient's care in the ED.   ____________________________________________  FINAL CLINICAL IMPRESSION(S) / ED DIAGNOSES  Final diagnoses:  Abscess      NEW MEDICATIONS STARTED DURING THIS VISIT:  ED Discharge Orders         Ordered    clindamycin (CLEOCIN) 300 MG capsule  3 times daily     11/09/18  1111              This chart was dictated using voice recognition software/Dragon. Despite best efforts to proofread, errors can occur which can change the meaning. Any change was purely unintentional.    Enid Derry, PA-C 11/09/18 1544    Phineas Semen, MD 11/09/18 [phone]

## 2018-11-09 NOTE — ED Triage Notes (Signed)
Pt with abscess on left buttocks for a week. Pt reports using hot compresses with no relief.

## 2018-11-16 ENCOUNTER — Encounter: Payer: Medicaid Other | Admitting: Obstetrics and Gynecology

## 2018-11-22 ENCOUNTER — Other Ambulatory Visit (HOSPITAL_COMMUNITY)
Admission: RE | Admit: 2018-11-22 | Discharge: 2018-11-22 | Disposition: A | Discharge status: Home / Self Care | Payer: Medicaid Other | Source: Ambulatory Visit | Attending: Obstetrics and Gynecology | Admitting: Obstetrics and Gynecology

## 2018-11-22 ENCOUNTER — Other Ambulatory Visit: Payer: Self-pay

## 2018-11-22 ENCOUNTER — Ambulatory Visit (INDEPENDENT_AMBULATORY_CARE_PROVIDER_SITE_OTHER): Payer: Medicaid Other | Admitting: Obstetrics and Gynecology

## 2018-11-22 ENCOUNTER — Encounter: Payer: Self-pay | Admitting: Obstetrics and Gynecology

## 2018-11-22 VITALS — BP 100/69 | HR 96 | Wt 218.9 lb

## 2018-11-22 DIAGNOSIS — Z124 Encounter for screening for malignant neoplasm of cervix: Secondary | ICD-10-CM | POA: Diagnosis present

## 2018-11-22 DIAGNOSIS — Z3A13 13 weeks gestation of pregnancy: Secondary | ICD-10-CM

## 2018-11-22 DIAGNOSIS — Z3481 Encounter for supervision of other normal pregnancy, first trimester: Secondary | ICD-10-CM | POA: Diagnosis not present

## 2018-11-22 LAB — POCT URINALYSIS DIPSTICK OB
Bilirubin, UA: NEGATIVE
Blood, UA: NEGATIVE
Glucose, UA: NEGATIVE
Leukocytes, UA: NEGATIVE
Nitrite, UA: NEGATIVE
Spec Grav, UA: 1.01 (ref 1.010–1.025)
Urobilinogen, UA: 0.2 E.U./dL
pH, UA: 6.5 (ref 5.0–8.0)

## 2018-11-22 NOTE — Progress Notes (Signed)
Patient comes in today for new Ob physical. She states that she is have some left arm numbness when she lays on it. She has had some nausea.

## 2018-11-22 NOTE — Progress Notes (Signed)
NOB: Patient with complaint of persistent nausea and vomiting.  She is keeping some food down and some liquids but not as much as she would like.  She has tried promethazine without success.  Diclegis previously prescribed by patient did not pick up her prescription yet.  Strategies for nausea vomiting discussed in detail.  Breast-feeding teaching and ready set baby discussed. Patient describes a history of postpartum hemorrhage with one of her pregnancies and possible retained placenta at that same pregnancy.  Rest of her pregnancies were uncomplicated vaginal deliveries.  Patient desires genetic testing-blood drawn today AFP next visit -early 1 hour GCT as patient had gestational diabetes with 1 of her prior pregnancies  Physical examination General NAD, Conversant  HEENT Atraumatic; Op clear with mmm.  Normo-cephalic. Pupils reactive. Anicteric sclerae  Thyroid/Neck Smooth without nodularity or enlargement. Normal ROM.  Neck Supple.  Skin No rashes, lesions or ulceration. Normal palpated skin turgor. No nodularity.  Breasts: No masses or discharge.  Symmetric.  No axillary adenopathy.  Lungs: Clear to auscultation.No rales or wheezes. Normal Respiratory effort, no retractions.  Heart: NSR.  No murmurs or rubs appreciated. No periferal edema  Abdomen: Soft.  Non-tender.  No masses.  No HSM. No hernia  Extremities: Moves all appropriately.  Normal ROM for age. No lymphadenopathy.  Neuro: Oriented to PPT.  Normal mood. Normal affect.     Pelvic:   Vulva: Normal appearance.  No lesions.  Vagina: No lesions or abnormalities noted.  Support: Normal pelvic support.  Urethra No masses tenderness or scarring.  Meatus Normal size without lesions or prolapse.  Cervix: Normal appearance.  No lesions.  Anus: Normal exam.  No lesions.  Perineum: Normal exam.  No lesions.        Bimanual   Adnexae: No masses.  Non-tender to palpation.  Uterus: Enlarged. 13 wks  POS FHT's  Non-tender.  Mobile.  AV.   Adnexae: No masses.  Non-tender to palpation.  Cul-de-sac: Negative for abnormality.  Adnexae: No masses.  Non-tender to palpation.         Pelvimetry   Diagonal: Reached.  Spines: Average.  Sacrum: Concave.  Pubic Arch: Normal.

## 2018-11-23 LAB — CBC WITH DIFFERENTIAL/PLATELET
Basophils Absolute: 0 10*3/uL (ref 0.0–0.2)
Basos: 0 %
EOS (ABSOLUTE): 0.5 10*3/uL — ABNORMAL HIGH (ref 0.0–0.4)
Eos: 5 %
Hematocrit: 33.8 % — ABNORMAL LOW (ref 34.0–46.6)
Hemoglobin: 11 g/dL — ABNORMAL LOW (ref 11.1–15.9)
Immature Grans (Abs): 0 10*3/uL (ref 0.0–0.1)
Immature Granulocytes: 0 %
Lymphocytes Absolute: 2.8 10*3/uL (ref 0.7–3.1)
Lymphs: 27 %
MCH: 27.2 pg (ref 26.6–33.0)
MCHC: 32.5 g/dL (ref 31.5–35.7)
MCV: 84 fL (ref 79–97)
Monocytes Absolute: 0.7 10*3/uL (ref 0.1–0.9)
Monocytes: 7 %
Neutrophils Absolute: 6.3 10*3/uL (ref 1.4–7.0)
Neutrophils: 61 %
Platelets: 260 10*3/uL (ref 150–450)
RBC: 4.04 x10E6/uL (ref 3.77–5.28)
RDW: 13 % (ref 11.7–15.4)
WBC: 10.2 10*3/uL (ref 3.4–10.8)

## 2018-11-24 LAB — URINALYSIS, ROUTINE W REFLEX MICROSCOPIC
Bilirubin, UA: NEGATIVE
Glucose, UA: NEGATIVE
Nitrite, UA: NEGATIVE
RBC, UA: NEGATIVE
Specific Gravity, UA: >=1.03 — AB (ref 1.005–1.030)
Urobilinogen, Ur: 1 mg/dL (ref 0.2–1.0)
pH, UA: 6 (ref 5.0–7.5)

## 2018-11-24 LAB — MICROSCOPIC EXAMINATION
Casts: NONE SEEN /lpf
Epithelial Cells (non renal): >10 /hpf — AB (ref 0–10)

## 2018-11-25 LAB — CULTURE, OB URINE

## 2018-11-25 LAB — URINE CULTURE, OB REFLEX

## 2018-11-25 LAB — GC/CHLAMYDIA PROBE AMP
Chlamydia trachomatis, NAA: NEGATIVE
Neisseria Gonorrhoeae by PCR: NEGATIVE

## 2018-11-26 LAB — MATERNIT21  PLUS CORE+ESS+SCA, BLOOD
11q23 deletion (Jacobsen): NOT DETECTED
15q11 deletion (PW Angelman): NOT DETECTED
1p36 deletion syndrome: NOT DETECTED
22q11 deletion (DiGeorge): NOT DETECTED
4p16 deletion(Wolf-Hirschhorn): NOT DETECTED
5p15 deletion (Cri-du-chat): NOT DETECTED
8q24 deletion (Langer-Giedion): NOT DETECTED
Fetal Fraction: 9
Monosomy X (Turner Syndrome): NOT DETECTED
Result (T21): NEGATIVE
Trisomy 13 (Patau syndrome): NEGATIVE
Trisomy 16: NOT DETECTED
Trisomy 18 (Edwards syndrome): NEGATIVE
Trisomy 21 (Down syndrome): NEGATIVE
Trisomy 22: NOT DETECTED
XXX (Triple X Syndrome): NOT DETECTED
XXY (Klinefelter Syndrome): NOT DETECTED
XYY (Jacobs Syndrome): NOT DETECTED

## 2018-11-27 LAB — DRUG PROFILE, UR, 9 DRUGS (LABCORP)
Amphetamines, Urine: NEGATIVE ng/mL
Barbiturate Quant, Ur: NEGATIVE ng/mL
Benzodiazepine Quant, Ur: NEGATIVE ng/mL
Cannabinoid Quant, Ur: POSITIVE — AB
Cocaine (Metab.): NEGATIVE ng/mL
Methadone Screen, Urine: NEGATIVE ng/mL
Opiate Quant, Ur: NEGATIVE ng/mL
PCP Quant, Ur: NEGATIVE ng/mL
Propoxyphene: NEGATIVE ng/mL

## 2018-11-27 LAB — NICOTINE SCREEN, URINE: Cotinine Ql Scrn, Ur: POSITIVE ng/mL — AB

## 2018-11-28 LAB — CYTOLOGY - PAP
Diagnosis: NEGATIVE
Diagnosis: REACTIVE

## 2018-12-04 ENCOUNTER — Encounter: Payer: Self-pay | Admitting: Surgical

## 2018-12-04 ENCOUNTER — Other Ambulatory Visit: Payer: Self-pay | Admitting: Surgical

## 2018-12-04 MED ORDER — ALBUTEROL SULFATE HFA 108 (90 BASE) MCG/ACT IN AERS: 2.0000 | INHALATION_SPRAY | Freq: Four times a day (QID) | RESPIRATORY_TRACT | 3 refills | Status: DC | PRN

## 2018-12-04 MED ORDER — NITROFURANTOIN MONOHYD MACRO 100 MG PO CAPS: 100.0000 mg | ORAL_CAPSULE | Freq: Two times a day (BID) | ORAL | 0 refills | Status: DC

## 2018-12-05 ENCOUNTER — Other Ambulatory Visit: Payer: Self-pay | Admitting: Surgical

## 2018-12-05 MED ORDER — DOXYLAMINE-PYRIDOXINE 10-10 MG PO TBEC: 2.0000 | DELAYED_RELEASE_TABLET | Freq: Every day | ORAL | 1 refills | Status: DC

## 2018-12-21 ENCOUNTER — Encounter: Payer: Medicaid Other | Admitting: Obstetrics and Gynecology

## 2019-01-31 ENCOUNTER — Encounter: Payer: Self-pay | Admitting: Obstetrics and Gynecology

## 2019-01-31 ENCOUNTER — Ambulatory Visit (INDEPENDENT_AMBULATORY_CARE_PROVIDER_SITE_OTHER): Payer: Medicaid Other | Admitting: Obstetrics and Gynecology

## 2019-01-31 ENCOUNTER — Other Ambulatory Visit: Payer: Self-pay

## 2019-01-31 ENCOUNTER — Other Ambulatory Visit: Payer: Self-pay | Admitting: Obstetrics and Gynecology

## 2019-01-31 ENCOUNTER — Ambulatory Visit (INDEPENDENT_AMBULATORY_CARE_PROVIDER_SITE_OTHER): Payer: Medicaid Other

## 2019-01-31 VITALS — BP 100/70 | HR 91 | Wt 231.1 lb

## 2019-01-31 DIAGNOSIS — Z8632 Personal history of gestational diabetes: Secondary | ICD-10-CM

## 2019-01-31 DIAGNOSIS — Z131 Encounter for screening for diabetes mellitus: Secondary | ICD-10-CM

## 2019-01-31 DIAGNOSIS — O26892 Other specified pregnancy related conditions, second trimester: Secondary | ICD-10-CM

## 2019-01-31 DIAGNOSIS — Z3482 Encounter for supervision of other normal pregnancy, second trimester: Secondary | ICD-10-CM

## 2019-01-31 DIAGNOSIS — Z3492 Encounter for supervision of normal pregnancy, unspecified, second trimester: Secondary | ICD-10-CM

## 2019-01-31 DIAGNOSIS — Z130 Encounter for screening for diseases of the blood and blood-forming organs and certain disorders involving the immune mechanism: Secondary | ICD-10-CM

## 2019-01-31 DIAGNOSIS — O09292 Supervision of pregnancy with other poor reproductive or obstetric history, second trimester: Secondary | ICD-10-CM

## 2019-01-31 DIAGNOSIS — O0932 Supervision of pregnancy with insufficient antenatal care, second trimester: Secondary | ICD-10-CM

## 2019-01-31 DIAGNOSIS — Z3A22 22 weeks gestation of pregnancy: Secondary | ICD-10-CM

## 2019-01-31 DIAGNOSIS — G5600 Carpal tunnel syndrome, unspecified upper limb: Secondary | ICD-10-CM | POA: Insufficient documentation

## 2019-01-31 DIAGNOSIS — O26899 Other specified pregnancy related conditions, unspecified trimester: Secondary | ICD-10-CM

## 2019-01-31 LAB — POCT URINALYSIS DIPSTICK OB
Bilirubin, UA: NEGATIVE
Blood, UA: NEGATIVE
Glucose, UA: NEGATIVE
Ketones, UA: NEGATIVE
Leukocytes, UA: NEGATIVE
Nitrite, UA: NEGATIVE
POC,PROTEIN,UA: NEGATIVE
Spec Grav, UA: 1.01 (ref 1.010–1.025)
Urobilinogen, UA: 0.2 E.U./dL
pH, UA: 7 (ref 5.0–8.0)

## 2019-01-31 NOTE — Patient Instructions (Signed)
Second Trimester of Pregnancy The second trimester is from week 14 through week 27 (months 4 through 6). The second trimester is often a time when you feel your best. Your body has adjusted to being pregnant, and you begin to feel better physically. Usually, morning sickness has lessened or quit completely, you may have more energy, and you may have an increase in appetite. The second trimester is also a time when the fetus is growing rapidly. At the end of the sixth month, the fetus is about 9 inches long and weighs about 1 pounds. You will likely begin to feel the baby move (quickening) between 16 and 20 weeks of pregnancy. Body changes during your second trimester Your body continues to go through many changes during your second trimester. The changes vary from woman to woman.  Your weight will continue to increase. You will notice your lower abdomen bulging out.  You may begin to get stretch marks on your hips, abdomen, and breasts.  You may develop headaches that can be relieved by medicines. The medicines should be approved by your health care provider.  You may urinate more often because the fetus is pressing on your bladder.  You may develop or continue to have heartburn as a result of your pregnancy.  You may develop constipation because certain hormones are causing the muscles that push waste through your intestines to slow down.  You may develop hemorrhoids or swollen, bulging veins (varicose veins).  You may have back pain. This is caused by: ? Weight gain. ? Pregnancy hormones that are relaxing the joints in your pelvis. ? A shift in weight and the muscles that support your balance.  Your breasts will continue to grow and they will continue to become tender.  Your gums may bleed and may be sensitive to brushing and flossing.  Dark spots or blotches (chloasma, mask of pregnancy) may develop on your face. This will likely fade after the baby is born.  A dark line from your  belly button to the pubic area (linea nigra) may appear. This will likely fade after the baby is born.  You may have changes in your hair. These can include thickening of your hair, rapid growth, and changes in texture. Some women also have hair loss during or after pregnancy, or hair that feels dry or thin. Your hair will most likely return to normal after your baby is born. What to expect at prenatal visits During a routine prenatal visit:  You will be weighed to make sure you and the fetus are growing normally.  Your blood pressure will be taken.  Your abdomen will be measured to track your baby's growth.  The fetal heartbeat will be listened to.  Any test results from the previous visit will be discussed. Your health care provider may ask you:  How you are feeling.  If you are feeling the baby move.  If you have had any abnormal symptoms, such as leaking fluid, bleeding, severe headaches, or abdominal cramping.  If you are using any tobacco products, including cigarettes, chewing tobacco, and electronic cigarettes.  If you have any questions. Other tests that may be performed during your second trimester include:  Blood tests that check for: ? Low iron levels (anemia). ? High blood sugar that affects pregnant women (gestational diabetes) between 24 and 28 weeks. ? Rh antibodies. This is to check for a protein on red blood cells (Rh factor).  Urine tests to check for infections, diabetes, or protein in the   urine.  An ultrasound to confirm the proper growth and development of the baby.  An amniocentesis to check for possible genetic problems.  Fetal screens for spina bifida and Down syndrome.  HIV (human immunodeficiency virus) testing. Routine prenatal testing includes screening for HIV, unless you choose not to have this test. Follow these instructions at home: Medicines  Follow your health care provider's instructions regarding medicine use. Specific medicines may be  either safe or unsafe to take during pregnancy.  Take a prenatal vitamin that contains at least 600 micrograms (mcg) of folic acid.  If you develop constipation, try taking a stool softener if your health care provider approves. Eating and drinking   Eat a balanced diet that includes fresh fruits and vegetables, whole grains, good sources of protein such as meat, eggs, or tofu, and low-fat dairy. Your health care provider will help you determine the amount of weight gain that is right for you.  Avoid raw meat and uncooked cheese. These carry germs that can cause birth defects in the baby.  If you have low calcium intake from food, talk to your health care provider about whether you should take a daily calcium supplement.  Limit foods that are high in fat and processed sugars, such as fried and sweet foods.  To prevent constipation: ? Drink enough fluid to keep your urine clear or pale yellow. ? Eat foods that are high in fiber, such as fresh fruits and vegetables, whole grains, and beans. Activity  Exercise only as directed by your health care provider. Most women can continue their usual exercise routine during pregnancy. Try to exercise for 30 minutes at least 5 days a week. Stop exercising if you experience uterine contractions.  Avoid heavy lifting, wear low heel shoes, and practice good posture.  A sexual relationship may be continued unless your health care provider directs you otherwise. Relieving pain and discomfort  Wear a good support bra to prevent discomfort from breast tenderness.  Take warm sitz baths to soothe any pain or discomfort caused by hemorrhoids. Use hemorrhoid cream if your health care provider approves.  Rest with your legs elevated if you have leg cramps or low back pain.  If you develop varicose veins, wear support hose. Elevate your feet for 15 minutes, 3-4 times a day. Limit salt in your diet. Prenatal Care  Write down your questions. Take them to  your prenatal visits.  Keep all your prenatal visits as told by your health care provider. This is important. Safety  Wear your seat belt at all times when driving.  Make a list of emergency phone numbers, including numbers for family, friends, the hospital, and police and fire departments. General instructions  Ask your health care provider for a referral to a local prenatal education class. Begin classes no later than the beginning of month 6 of your pregnancy.  Ask for help if you have counseling or nutritional needs during pregnancy. Your health care provider can offer advice or refer you to specialists for help with various needs.  Do not use hot tubs, steam rooms, or saunas.  Do not douche or use tampons or scented sanitary pads.  Do not cross your legs for long periods of time.  Avoid cat litter boxes and soil used by cats. These carry germs that can cause birth defects in the baby and possibly loss of the fetus by miscarriage or stillbirth.  Avoid all smoking, herbs, alcohol, and unprescribed drugs. Chemicals in these products can affect the formation   and growth of the baby.  Do not use any products that contain nicotine or tobacco, such as cigarettes and e-cigarettes. If you need help quitting, ask your health care provider.  Visit your dentist if you have not gone yet during your pregnancy. Use a soft toothbrush to brush your teeth and be gentle when you floss. Contact a health care provider if:  You have dizziness.  You have mild pelvic cramps, pelvic pressure, or nagging pain in the abdominal area.  You have persistent nausea, vomiting, or diarrhea.  You have a bad smelling vaginal discharge.  You have pain when you urinate. Get help right away if:  You have a fever.  You are leaking fluid from your vagina.  You have spotting or bleeding from your vagina.  You have severe abdominal cramping or pain.  You have rapid weight gain or weight loss.  You have  shortness of breath with chest pain.  You notice sudden or extreme swelling of your face, hands, ankles, feet, or legs.  You have not felt your baby move in over an hour.  You have severe headaches that do not go away when you take medicine.  You have vision changes. Summary  The second trimester is from week 14 through week 27 (months 4 through 6). It is also a time when the fetus is growing rapidly.  Your body goes through many changes during pregnancy. The changes vary from woman to woman.  Avoid all smoking, herbs, alcohol, and unprescribed drugs. These chemicals affect the formation and growth your baby.  Do not use any tobacco products, such as cigarettes, chewing tobacco, and e-cigarettes. If you need help quitting, ask your health care provider.  Contact your health care provider if you have any questions. Keep all prenatal visits as told by your health care provider. This is important. This information is not intended to replace advice given to you by your health care provider. Make sure you discuss any questions you have with your health care provider. Document Released: 01/26/2001 Document Revised: 05/26/2018 Document Reviewed: 03/09/2016 Elsevier Patient Education  2020 [employer]   Carpal Tunnel Syndrome  Carpal tunnel syndrome is a condition that causes pain in your hand and arm. The carpal tunnel is a narrow area located on the palm side of your wrist. Repeated wrist motion or certain diseases may cause swelling within the tunnel. This swelling pinches the main nerve in the wrist (median nerve). What are the causes? This condition may be caused by:  Repeated wrist motions.  Wrist injuries.  Arthritis.  A cyst or tumor in the carpal tunnel.  Fluid buildup during pregnancy. Sometimes the cause of this condition is not known. What increases the risk? The following factors may make you more likely to develop this condition:  Having a job, such as being a  Haematologist, that requires you to repeatedly move your wrist in the same motion.  Being a woman.  Having certain conditions, such as: ? Diabetes. ? Obesity. ? An underactive thyroid (hypothyroidism). ? Kidney failure. What are the signs or symptoms? Symptoms of this condition include:  A tingling feeling in your fingers, especially in your thumb, index, and middle fingers.  Tingling or numbness in your hand.  An aching feeling in your entire arm, especially when your wrist and elbow are bent for a long time.  Wrist pain that goes up your arm to your shoulder.  Pain that goes down into your palm or fingers.  A  weak feeling in your hands. You may have trouble grabbing and holding items. Your symptoms may feel worse during the night. How is this diagnosed? This condition is diagnosed with a medical history and physical exam. You may also have tests, including:  Electromyogram (EMG). This test measures electrical signals sent by your nerves into the muscles.  Nerve conduction study. This test measures how well electrical signals pass through your nerves.  Imaging tests, such as X-rays, ultrasound, and MRI. These tests check for possible causes of your condition. How is this treated? This condition may be treated with:  Lifestyle changes. It is important to stop or change the activity that caused your condition.  Doing exercise and activities to strengthen your muscles and bones (physical therapy).  Learning how to use your hand again after diagnosis (occupational therapy).  Medicines for pain and inflammation. This may include medicine that is injected into your wrist.  A wrist splint.  Surgery. Follow these instructions at home: If you have a splint:  Wear the splint as told by your health care provider. Remove it only as told by your health care provider.  Loosen the splint if your fingers tingle, become numb, or turn cold and blue.  Keep the splint  clean.  If the splint is not waterproof: ? Do not let it get wet. ? Cover it with a watertight covering when you take a bath or shower. Managing pain, stiffness, and swelling   If directed, put ice on the painful area: ? If you have a removable splint, remove it as told by your health care provider. ? Put ice in a plastic bag. ? Place a towel between your skin and the bag. ? Leave the ice on for 20 minutes, 2-3 times per day. General instructions  Take over-the-counter and prescription medicines only as told by your health care provider.  Rest your wrist from any activity that may be causing your pain. If your condition is work related, talk with your employer about changes that can be made, such as getting a wrist pad to use while typing.  Do any exercises as told by your health care provider, physical therapist, or occupational therapist.  Keep all follow-up visits as told by your health care provider. This is important. Contact a health care provider if:  You have new symptoms.  Your pain is not controlled with medicines.  Your symptoms get worse. Get help right away if:  You have severe numbness or tingling in your wrist or hand. Summary  Carpal tunnel syndrome is a condition that causes pain in your hand and arm.  It is usually caused by repeated wrist motions.  Lifestyle changes and medicines are used to treat carpal tunnel syndrome. Surgery may be recommended.  Follow your health care provider's instructions about wearing a splint, resting from activity, keeping follow-up visits, and calling for help. This information is not intended to replace advice given to you by your health care provider. Make sure you discuss any questions you have with your health care provider. Document Released: 01/30/2000 Document Revised: 06/10/2017 Document Reviewed: 06/10/2017 Elsevier Patient Education  2020 [employer].

## 2019-01-31 NOTE — Progress Notes (Signed)
ROB-Pt present for prenatal care. Pt stated that is doing well other than having pelvic pain off and on. Pt declined flu vaccine.

## 2019-01-31 NOTE — Progress Notes (Signed)
ROB: Patient with insufficient PNC, has only had 1 visit since start of [place] at 12 weeks.  Notes having a COVID test but took over 1 month to get results so could not schedule an appointment, however test was negative.  Also noting carpal tunnel in both hands, but worse in left. Occurred in a previous pregnancy as well. Discussed use of hand brace. H/o gestational DM, has not done early glucola, cannot perform today, so will schedule for next visit. Normal anatomy scan today. Declined flu vaccine. Notes not sleeping due to fetal hyperactivity at night.  Sleeps during the day. Discussed use of Benadryl and warm baths. Also noting worsening headaches, not relieved by tylenol.  Does think it may be stress related (notes kids are stressing her out at home). Discussed stress relievers, can increase Tylenol dosing.

## 2019-02-06 ENCOUNTER — Other Ambulatory Visit: Payer: Medicaid Other

## 2019-02-06 ENCOUNTER — Encounter: Payer: Medicaid Other | Admitting: Obstetrics and Gynecology

## 2019-02-09 ENCOUNTER — Encounter: Payer: Self-pay | Admitting: Emergency Medicine

## 2019-02-09 ENCOUNTER — Other Ambulatory Visit: Payer: Self-pay

## 2019-02-09 ENCOUNTER — Emergency Department
Admission: EM | Admit: 2019-02-09 | Discharge: 2019-02-09 | Disposition: A | Discharge status: Home / Self Care | Payer: Medicaid Other | Attending: Emergency Medicine | Admitting: Emergency Medicine

## 2019-02-09 DIAGNOSIS — Z87891 Personal history of nicotine dependence: Secondary | ICD-10-CM | POA: Diagnosis not present

## 2019-02-09 DIAGNOSIS — J45901 Unspecified asthma with (acute) exacerbation: Secondary | ICD-10-CM | POA: Insufficient documentation

## 2019-02-09 DIAGNOSIS — R0789 Other chest pain: Secondary | ICD-10-CM | POA: Diagnosis present

## 2019-02-09 MED ORDER — ACETAMINOPHEN 325 MG PO TABS
650.0000 mg | ORAL_TABLET | Freq: Once | ORAL | Status: DC
  Filled 2019-02-09: qty 2

## 2019-02-09 MED ORDER — ONDANSETRON HCL 4 MG/2ML IJ SOLN
INTRAMUSCULAR | Status: AC
  Filled 2019-02-09: qty 2

## 2019-02-09 MED ORDER — ONDANSETRON HCL 4 MG/2ML IJ SOLN
4.0000 mg | Freq: Once | INTRAMUSCULAR | Status: AC
  Administered 2019-02-09: 4 mg via INTRAVENOUS

## 2019-02-09 MED ORDER — LEVALBUTEROL HCL 1.25 MG/0.5ML IN NEBU: 1.2500 mg | INHALATION_SOLUTION | Freq: Once | RESPIRATORY_TRACT | Status: DC

## 2019-02-09 MED ORDER — IPRATROPIUM-ALBUTEROL 0.5-2.5 (3) MG/3ML IN SOLN
3.0000 mL | Freq: Once | RESPIRATORY_TRACT | Status: AC
  Administered 2019-02-09: 3 mL via RESPIRATORY_TRACT
  Filled 2019-02-09: qty 3

## 2019-02-09 MED ORDER — METHYLPREDNISOLONE SODIUM SUCC 125 MG IJ SOLR
125.0000 mg | Freq: Once | INTRAMUSCULAR | Status: AC
  Administered 2019-02-09: 125 mg via INTRAVENOUS
  Filled 2019-02-09: qty 2

## 2019-02-09 MED ORDER — ALBUTEROL SULFATE HFA 108 (90 BASE) MCG/ACT IN AERS: 2.0000 | INHALATION_SPRAY | RESPIRATORY_TRACT | 0 refills | Status: DC | PRN

## 2019-02-09 NOTE — ED Notes (Signed)
Pt sats are stable. Pt does not have a primary and her OBGYN per pt is not RX the correct inhaler. Advised pt to try [facility] for follow up so she can receive her correct asthma meds. Pt has never been RX neb tx at home.

## 2019-02-09 NOTE — ED Notes (Signed)
Pt suddenly felt nauseated after receiving solumedrol. PA notified. New order received.

## 2019-02-09 NOTE — ED Provider Notes (Signed)
[facility] Emergency Department Provider Note ____________________________________________  Time seen: 1527  I have reviewed the triage vital signs and the nursing notes.  HISTORY  Chief Complaint  Asthma and Abscess  HPI Brittany [name] is a 28 y.o. female presents to the ED for evaluation of 2-day complaint of chest tightness and wheezing.  Patient denies any fevers, cough, congestion.  She has been using her albuterol inhaler consistently over the last 2 days, but but denies any significant benefit.  She called her OB provider, as she is 6 months gestation with a single IUP, and requested a Flovent prescription, she was instead sent a another proair protection at the time.  She presents to the ED for wheezing and mild shortness of breath.   Past Medical History:  Diagnosis Date  . Asthma   . Chlamydia   . Gonorrhea   . Hx MRSA infection 2005  . NVD (normal vaginal delivery) 08/30/2010  . Trichimoniasis     Patient Active Problem List   Diagnosis Date Noted  . History of gestational diabetes in prior pregnancy, currently pregnant in second trimester 01/31/2019  . Carpal tunnel syndrome during pregnancy 01/31/2019  . Insufficient prenatal care in second trimester 01/31/2019  . Peritonsillar abscess 02/25/2018  . Asthma 02/25/2018  . Status post normal vaginal delivery 09/05/2013  . Other specified indication for care or intervention related to labor and delivery, unspecified as to episode of care 09/03/2013  . Pregnant 12/01/2011  . Elevated blood pressure complicating pregnancy, antepartum 12/01/2011    Past Surgical History:  Procedure Laterality Date  . MULTIPLE TOOTH EXTRACTIONS    . NO PAST SURGERIES      Prior to Admission medications   Medication Sig Start Date End Date Taking? Authorizing Provider  albuterol (PROVENTIL HFA;VENTOLIN HFA) 108 (90 Base) MCG/ACT inhaler Inhale 1-2 puffs into the lungs every 6 (six) hours as needed for  wheezing or shortness of breath.    [provider]  albuterol (VENTOLIN HFA) 108 (90 Base) MCG/ACT inhaler Inhale 2 puffs into the lungs every 6 (six) hours as needed for wheezing or shortness of breath. 12/04/18   Harlin Heys, MD  albuterol (VENTOLIN HFA) 108 (90 Base) MCG/ACT inhaler Inhale 2 puffs into the lungs every 4 (four) hours as needed. 02/09/19   Darin Redmann, Dannielle Karvonen, PA-C  Doxylamine-Pyridoxine (DICLEGIS) 10-10 MG TBEC Take 2 tablets by mouth at bedtime. If symptoms persist, add one tablet in the morning and one in the afternoon 12/05/18   Harlin Heys, MD  Prenatal Multivit-Min-Fe-FA (PRE-NATAL PO) Take by mouth.    [provider]    Allergies Iodine and Sulfonamide derivatives  Family History  Problem Relation Age of Onset  . Diabetes Maternal Grandmother   . Hypertension Father   . Heart disease Father   . Lung disease Father   . Healthy Mother   . Anesthesia problems Neg Hx   . Other Neg Hx     Social History Social History   Tobacco Use  . Smoking status: Former Smoker    Packs/day: 0.25    Years: 5.00    Pack years: 1.25    Types: Cigarettes    Quit date: 12/27/2012    Years since quitting: 6.1  . Smokeless tobacco: Never Used  Substance Use Topics  . Alcohol use: No  . Drug use: Not Currently    Types: Marijuana    Review of Systems  Constitutional: Negative for fever. Eyes: Negative for visual  changes. ENT: Negative for sore throat. Cardiovascular: Negative for chest pain. Respiratory: Positive for shortness of breath. Gastrointestinal: Negative for abdominal pain, vomiting and diarrhea. Genitourinary: Negative for dysuria.  Negative for vaginal bleeding or pelvic pain. Musculoskeletal: Negative for back pain. Skin: Negative for rash. Neurological: Negative for headaches, focal weakness or numbness. ____________________________________________  PHYSICAL EXAM:  VITAL SIGNS: ED Triage Vitals [02/09/19 1338]   Enc Vitals Group     BP 139/69     Pulse Rate (!) 119     Resp 18     Temp 98.6 F (37 C)     Temp Source Oral     SpO2 98 %     Weight      Height      Head Circumference      Peak Flow      Pain Score      Pain Loc      Pain Edu?      Excl. in GC?     Constitutional: Alert and oriented. Well appearing and in no distress. Head: Normocephalic and atraumatic. Eyes: Conjunctivae are normal. Normal extraocular movements Cardiovascular: Normal rate, regular rhythm. Normal distal pulses. Respiratory: Normal respiratory effort.  Patient with audible inspiratory and expiratory wheeze noted bilaterally.  No rales/rhonchi. Gastrointestinal: Soft, gravid, and nontender. No distention. Musculoskeletal: Nontender with normal range of motion in all extremities.  Neurologic:  Normal gait without ataxia. Normal speech and language. No gross focal neurologic deficits are appreciated. Skin:  Skin is warm, dry and intact. No rash noted. ____________________________________________  PROCEDURES  DuoNeb x 3 Solumedrol 125 mg IVP Procedures ____________________________________________  INITIAL IMPRESSION / ASSESSMENT AND PLAN / ED COURSE  Patient with a history of moderate asthma controlled with bronchodilators.  Patient is reported over the last 2 days she has used her remaining bronchodilator, but has limited benefit.  She presents today with wheezing and chest tightness on presentation.  She did have significant provement of her symptoms both subjectively and objectively, after 3 duo nebs and ultimately a single IV dose of Solu-Medrol.  Patient is reporting improvement and is otherwise stable for discharge.  She will be given a prescription for albuterol inhaler.  She is advised to follow-up with primary provider or OB provider for ongoing symptoms.  Return precautions have been reviewed.  Brittany [name] was evaluated in Emergency Department on 02/09/2019 for the symptoms described in  the history of present illness. She was evaluated in the context of the global COVID-19 pandemic, which necessitated consideration that the patient might be at risk for infection with the SARS-CoV-2 virus that causes COVID-19. Institutional protocols and algorithms that pertain to the evaluation of patients at risk for COVID-19 are in a state of rapid change based on information released by regulatory bodies including the CDC and federal and state organizations. These policies and algorithms were followed during the patient's care in the ED. ____________________________________________  FINAL CLINICAL IMPRESSION(S) / ED DIAGNOSES  Final diagnoses:  Moderate asthma with exacerbation, unspecified whether persistent      Keyia Moretto, Charlesetta Ivory, PA-C 02/09/19 [id number]    Dionne Bucy, MD 02/09/19 [phone]

## 2019-02-09 NOTE — Discharge Instructions (Addendum)
You have been treated for an asthma exacerbation. You should use your inhaler as directed. Follow-up with your primary provider or OB provider as scheduled. Return to the ED as needed.

## 2019-02-09 NOTE — ED Notes (Addendum)
Pt states she has used an entire inhaler in the last 2 days. Pt has a hx of asthma. Pt states she is having trouble breathing and its worse and she has been waiting for 2 hours. Pt denies exposure to covid. Pt denies fever, Pt states she is pregnant. Pt unhappy about wearing a mask for safety.

## 2019-02-09 NOTE — ED Notes (Signed)
Pt given neb and instructions. Pt states "this aint gonna be enough as bad as my chest hurts".

## 2019-02-09 NOTE — ED Triage Notes (Signed)
Presents with some SOB and wheezing  States hx of asthma and states inhalers are not working  Also has an abscess

## 2019-02-09 NOTE — ED Notes (Signed)
Pt requested a wheelchair from the lobby. Told ED tech she was unable to walk. Pt then gets out of chair on entering tx room and ambulated to the bathroom with no difficulty.

## 2019-02-09 NOTE — ED Notes (Signed)
Reviewed discharge instructions, follow-up care, and prescriptions with patient. Patient verbalized understanding of all information reviewed. Patient stable, with no distress noted at this time.    

## 2019-02-16 NOTE — L&D Delivery Note (Signed)
Delivery Summary for Brittany [name]  Labor Events:   Preterm labor: No data found  Rupture date: 05/17/2019  Rupture time: 11:00 PM  Rupture type: Spontaneous Bulging bag of water Possible ROM - for evaluation  Fluid Color: Clear Pink  Induction: No data found  Augmentation: No data found  Complications: No data found  Cervical ripening: No data found No data found   No data found     Delivery:   Episiotomy: No data found  Lacerations: No data found  Repair suture: No data found  Repair # of packets: No data found  Blood loss (ml): 1000   Information for the patient's newborn:  Rylann, Munford [[id number]]    Delivery 05/18/2019 9:05 AM by  Vaginal, Spontaneous Sex:  female Gestational Age: [redacted]w[redacted]d Delivery Clinician:   Living?:         APGARS  One minute Five minutes Ten minutes  Skin color:        Heart rate:        Grimace:        Muscle tone:        Breathing:        Totals: 9  9      Presentation/position:      Resuscitation:   Cord information:    Disposition of cord blood:     Blood gases sent?  Complications:   Placenta: Delivered:       appearance Newborn Measurements: Weight: 7 lb 1.9 oz (3230 g)  Height: 19.29"  Head circumference:    Chest circumference:    Other providers:    Additional  information: Forceps:   Vacuum:   Breech:   Observed anomalies       Delivery Note At 9:05 AM a viable and healthy female was delivered via Vaginal, Spontaneous (Presentation: Left Occiput Anterior).  APGAR: 9, 9; weight  3230 grams.  Copious amount of clear fluid at delivery. Placenta status: Spontaneously removed, Intact.  Cord: 3 vessels with the following complications: None.  Cord pH: not obtained.  Cord blood collected.   Anesthesia: Epidural Episiotomy: None Lacerations:  None. Bilateral periurethral abrasions Suture Repair: None Est. Blood Loss (mL): 1000  Mom to postpartum.  Baby to Couplet care / Skin to Skin.  Hildred Laser,  MD 05/18/2019, 9:37 AM

## 2019-03-01 ENCOUNTER — Other Ambulatory Visit: Payer: Medicaid Other

## 2019-03-01 ENCOUNTER — Encounter: Payer: Medicaid Other | Admitting: Obstetrics and Gynecology

## 2019-03-08 ENCOUNTER — Telehealth: Payer: Self-pay | Admitting: Obstetrics and Gynecology

## 2019-03-08 NOTE — Telephone Encounter (Signed)
Called pt to schedule an appt lmtrc

## 2019-03-20 DIAGNOSIS — O99013 Anemia complicating pregnancy, third trimester: Secondary | ICD-10-CM | POA: Diagnosis present

## 2019-03-21 ENCOUNTER — Other Ambulatory Visit: Payer: Self-pay

## 2019-03-21 ENCOUNTER — Ambulatory Visit (INDEPENDENT_AMBULATORY_CARE_PROVIDER_SITE_OTHER): Payer: Medicaid Other | Admitting: Obstetrics and Gynecology

## 2019-03-21 ENCOUNTER — Other Ambulatory Visit: Payer: Medicaid Other

## 2019-03-21 ENCOUNTER — Encounter: Payer: Self-pay | Admitting: Obstetrics and Gynecology

## 2019-03-21 VITALS — BP 111/73 | HR 90 | Wt 236.0 lb

## 2019-03-21 DIAGNOSIS — Z23 Encounter for immunization: Secondary | ICD-10-CM

## 2019-03-21 DIAGNOSIS — Z3482 Encounter for supervision of other normal pregnancy, second trimester: Secondary | ICD-10-CM

## 2019-03-21 DIAGNOSIS — Z131 Encounter for screening for diabetes mellitus: Secondary | ICD-10-CM

## 2019-03-21 DIAGNOSIS — Z3483 Encounter for supervision of other normal pregnancy, third trimester: Secondary | ICD-10-CM | POA: Diagnosis not present

## 2019-03-21 DIAGNOSIS — Z130 Encounter for screening for diseases of the blood and blood-forming organs and certain disorders involving the immune mechanism: Secondary | ICD-10-CM

## 2019-03-21 NOTE — Lactation Note (Signed)
Lactation Consultation Note  Patient Name: Brittany [name] Date: 03/21/2019     Maternal Data    Feeding    LATCH Score                   Interventions    Lactation Tools Discussed/Used     Consult Status   [id number], with prior breastfeeding experience. Lactation student discussed benefits of breastfeeding per the Ready, Set, Baby curriculum. Kalman Drape Hollen encouraged to review breastfeeding information on Ready, set, [employer] site and given information for virtual breastfeeding classes. No further questions at this time.    Satira Anis Student 03/21/2019, 10:47 AM

## 2019-03-21 NOTE — Progress Notes (Signed)
ROB: Continues to have carpal tunnel symptoms but states that she is "dealing with it".  1 hour GCT today.  Tubal papers signed today but she is "not sure".

## 2019-03-22 LAB — CBC
Hematocrit: 31.6 % — ABNORMAL LOW (ref 34.0–46.6)
Hemoglobin: 10.5 g/dL — ABNORMAL LOW (ref 11.1–15.9)
MCH: 27.9 pg (ref 26.6–33.0)
MCHC: 33.2 g/dL (ref 31.5–35.7)
MCV: 84 fL (ref 79–97)
Platelets: 237 10*3/uL (ref 150–450)
RBC: 3.76 x10E6/uL — ABNORMAL LOW (ref 3.77–5.28)
RDW: 13.4 % (ref 11.7–15.4)
WBC: 11.1 10*3/uL — ABNORMAL HIGH (ref 3.4–10.8)

## 2019-03-22 LAB — RPR: RPR Ser Ql: NONREACTIVE

## 2019-03-22 LAB — GLUCOSE, 1 HOUR GESTATIONAL: Gestational Diabetes Screen: 165 mg/dL — ABNORMAL HIGH (ref 65–139)

## 2019-03-22 NOTE — Addendum Note (Signed)
Addended by: Dorian Pod on: 03/22/2019 09:05 AM   Modules accepted: Orders

## 2019-04-11 ENCOUNTER — Other Ambulatory Visit: Payer: Medicaid Other

## 2019-04-11 ENCOUNTER — Encounter: Payer: Medicaid Other | Admitting: Obstetrics and Gynecology

## 2019-04-11 ENCOUNTER — Telehealth: Payer: Self-pay | Admitting: Obstetrics and Gynecology

## 2019-04-11 NOTE — Telephone Encounter (Signed)
Called patient to touch base with her about her appointment she had missed. I was unable to reach the patient, so I left a voicemail for her to return my call so that we can reschedule if she wishes to do so.  -TC

## 2019-04-19 ENCOUNTER — Other Ambulatory Visit: Payer: Self-pay

## 2019-04-19 ENCOUNTER — Other Ambulatory Visit (HOSPITAL_COMMUNITY)
Admission: RE | Admit: 2019-04-19 | Discharge: 2019-04-19 | Disposition: A | Discharge status: Home / Self Care | Payer: Medicaid Other | Source: Ambulatory Visit | Attending: Obstetrics and Gynecology | Admitting: Obstetrics and Gynecology

## 2019-04-19 ENCOUNTER — Encounter: Payer: Self-pay | Admitting: Obstetrics and Gynecology

## 2019-04-19 ENCOUNTER — Ambulatory Visit (INDEPENDENT_AMBULATORY_CARE_PROVIDER_SITE_OTHER): Payer: Medicaid Other | Admitting: Obstetrics and Gynecology

## 2019-04-19 VITALS — BP 115/77 | HR 91 | Wt 246.7 lb

## 2019-04-19 DIAGNOSIS — Z3483 Encounter for supervision of other normal pregnancy, third trimester: Secondary | ICD-10-CM

## 2019-04-19 DIAGNOSIS — O0933 Supervision of pregnancy with insufficient antenatal care, third trimester: Secondary | ICD-10-CM

## 2019-04-19 LAB — POCT URINALYSIS DIPSTICK OB
Bilirubin, UA: NEGATIVE
Blood, UA: NEGATIVE
Glucose, UA: NEGATIVE
Ketones, UA: NEGATIVE
Leukocytes, UA: NEGATIVE
Nitrite, UA: NEGATIVE
POC,PROTEIN,UA: NEGATIVE
Spec Grav, UA: 1.01 (ref 1.010–1.025)
Urobilinogen, UA: 0.2 E.U./dL
pH, UA: 7 (ref 5.0–8.0)

## 2019-04-19 NOTE — Addendum Note (Signed)
Addended by: Dorian Pod on: 04/19/2019 03:51 PM   Modules accepted: Orders

## 2019-04-19 NOTE — Addendum Note (Signed)
Addended by: Dorian Pod on: 04/19/2019 03:41 PM   Modules accepted: Orders

## 2019-04-19 NOTE — Progress Notes (Signed)
ROB: Patient missing appointments and did not come for her 3-hour GTT.  Stressed the importance of the 3-hour GTT and she says that she can get it tomorrow.  Complains of worsening pelvic pressure symptoms denies contractions vaginal bleeding leakage of fluid.  Urine negative  Speculum examination shows long closed cervix.  New swab sent.

## 2019-04-20 ENCOUNTER — Other Ambulatory Visit: Payer: Medicaid Other

## 2019-04-20 DIAGNOSIS — O09292 Supervision of pregnancy with other poor reproductive or obstetric history, second trimester: Secondary | ICD-10-CM

## 2019-04-21 LAB — GESTATIONAL GLUCOSE TOLERANCE
Glucose, Fasting: 95 mg/dL — ABNORMAL HIGH (ref 65–94)
Glucose, GTT - 1 Hour: 175 mg/dL (ref 65–179)
Glucose, GTT - 2 Hour: 128 mg/dL (ref 65–154)
Glucose, GTT - 3 Hour: 90 mg/dL (ref 65–139)

## 2019-04-23 LAB — CERVICOVAGINAL ANCILLARY ONLY
Chlamydia: NEGATIVE
Comment: NEGATIVE
Comment: NEGATIVE
Comment: NORMAL
Neisseria Gonorrhea: NEGATIVE
Trichomonas: NEGATIVE

## 2019-05-02 LAB — MONITOR DRUG PROFILE 14(MW)
Amphetamine Scrn, Ur: NEGATIVE ng/mL
BARBITURATE SCREEN URINE: NEGATIVE ng/mL
BENZODIAZEPINE SCREEN, URINE: NEGATIVE ng/mL
Buprenorphine, Urine: NEGATIVE ng/mL
Cocaine (Metab) Scrn, Ur: NEGATIVE ng/mL
Creatinine(Crt), U: 30.5 mg/dL (ref 20.0–300.0)
Fentanyl, Urine: NEGATIVE pg/mL
Meperidine Screen, Urine: NEGATIVE ng/mL
Methadone Screen, Urine: NEGATIVE ng/mL
OXYCODONE+OXYMORPHONE UR QL SCN: NEGATIVE ng/mL
Opiate Scrn, Ur: NEGATIVE ng/mL
Ph of Urine: 7.1 (ref 4.5–8.9)
Phencyclidine Qn, Ur: NEGATIVE ng/mL
Propoxyphene Scrn, Ur: NEGATIVE ng/mL
SPECIFIC GRAVITY: 1.008
Tramadol Screen, Urine: NEGATIVE ng/mL

## 2019-05-02 LAB — CANNABINOID (GC/MS), URINE
Cannabinoid: POSITIVE — AB
Carboxy THC (GC/MS): 196 ng/mL

## 2019-05-02 NOTE — Progress Notes (Signed)
ROB-Pt present for routine prenatal care and 36 week cultures. Pt c/o a lot of pressure and pain in the lower abd and vaginal area.

## 2019-05-03 ENCOUNTER — Other Ambulatory Visit: Payer: Self-pay

## 2019-05-03 ENCOUNTER — Ambulatory Visit (INDEPENDENT_AMBULATORY_CARE_PROVIDER_SITE_OTHER): Payer: Medicaid Other | Admitting: Obstetrics and Gynecology

## 2019-05-03 ENCOUNTER — Encounter: Payer: Self-pay | Admitting: Obstetrics and Gynecology

## 2019-05-03 VITALS — BP 106/71 | HR 81 | Wt 247.3 lb

## 2019-05-03 DIAGNOSIS — O26899 Other specified pregnancy related conditions, unspecified trimester: Secondary | ICD-10-CM | POA: Diagnosis not present

## 2019-05-03 DIAGNOSIS — O26843 Uterine size-date discrepancy, third trimester: Secondary | ICD-10-CM

## 2019-05-03 DIAGNOSIS — R102 Pelvic and perineal pain: Secondary | ICD-10-CM

## 2019-05-03 DIAGNOSIS — O99210 Obesity complicating pregnancy, unspecified trimester: Secondary | ICD-10-CM | POA: Diagnosis not present

## 2019-05-03 DIAGNOSIS — Z3A35 35 weeks gestation of pregnancy: Secondary | ICD-10-CM

## 2019-05-03 DIAGNOSIS — Z3483 Encounter for supervision of other normal pregnancy, third trimester: Secondary | ICD-10-CM

## 2019-05-03 LAB — POCT URINALYSIS DIPSTICK OB
Bilirubin, UA: NEGATIVE
Blood, UA: NEGATIVE
Glucose, UA: NEGATIVE
Ketones, UA: NEGATIVE
Nitrite, UA: NEGATIVE
Spec Grav, UA: 1.015 (ref 1.010–1.025)
Urobilinogen, UA: 0.2 E.U./dL
pH, UA: 7 (ref 5.0–8.0)

## 2019-05-03 NOTE — Progress Notes (Signed)
ROB: Patient complains of back pain and pelvic pain.  Ready for pregnancy to be over. Discussed stretches for pain. 36 week labs done today. Growth scan ordered due to size >dates. Patient desires to discuss IOL due to childcare issues (does not have much family in town) and is concerned for going into spontaneous labor in the middle of the night with 4 small children. Discussed that she is a candidate for IOL due to BMI > 40.  Would need IOL by 40th week. Patient desires April 9th. Will schedule closer to time.

## 2019-05-05 LAB — STREP GP B NAA: Strep Gp B NAA: POSITIVE — AB

## 2019-05-06 LAB — GC/CHLAMYDIA PROBE AMP
Chlamydia trachomatis, NAA: NEGATIVE
Neisseria Gonorrhoeae by PCR: NEGATIVE

## 2019-05-10 ENCOUNTER — Other Ambulatory Visit: Payer: Self-pay

## 2019-05-10 ENCOUNTER — Encounter: Payer: Self-pay | Admitting: Obstetrics and Gynecology

## 2019-05-10 ENCOUNTER — Ambulatory Visit (INDEPENDENT_AMBULATORY_CARE_PROVIDER_SITE_OTHER): Payer: Medicaid Other | Admitting: Obstetrics and Gynecology

## 2019-05-10 ENCOUNTER — Ambulatory Visit (INDEPENDENT_AMBULATORY_CARE_PROVIDER_SITE_OTHER): Payer: Medicaid Other

## 2019-05-10 VITALS — BP 119/85 | HR 89 | Wt 246.8 lb

## 2019-05-10 DIAGNOSIS — Z3483 Encounter for supervision of other normal pregnancy, third trimester: Secondary | ICD-10-CM

## 2019-05-10 DIAGNOSIS — Z3A36 36 weeks gestation of pregnancy: Secondary | ICD-10-CM

## 2019-05-10 DIAGNOSIS — O26843 Uterine size-date discrepancy, third trimester: Secondary | ICD-10-CM

## 2019-05-10 LAB — POCT URINALYSIS DIPSTICK OB
Bilirubin, UA: NEGATIVE
Blood, UA: NEGATIVE
Glucose, UA: NEGATIVE
Ketones, UA: NEGATIVE
Leukocytes, UA: NEGATIVE
Nitrite, UA: NEGATIVE
Spec Grav, UA: 1.01 (ref 1.010–1.025)
Urobilinogen, UA: 0.2 E.U./dL
pH, UA: 7 (ref 5.0–8.0)

## 2019-05-10 NOTE — Progress Notes (Signed)
ROB: Patient says she is tired of being pregnant.  Says for childcare issues she has to deliver between the second and the ninth.  I explained to her about inductions before 39 weeks and she said"well if that is the case I will just have to force myself into labor."  Denies contractions.  Reports normal fetal movement.

## 2019-05-13 ENCOUNTER — Observation Stay
Admission: EM | Admit: 2019-05-13 | Discharge: 2019-05-13 | Disposition: A | Discharge status: Home / Self Care | Payer: Medicaid Other | Attending: Obstetrics and Gynecology | Admitting: Obstetrics and Gynecology

## 2019-05-13 ENCOUNTER — Other Ambulatory Visit: Payer: Self-pay

## 2019-05-13 ENCOUNTER — Encounter: Payer: Self-pay | Admitting: Obstetrics and Gynecology

## 2019-05-13 DIAGNOSIS — J45909 Unspecified asthma, uncomplicated: Secondary | ICD-10-CM | POA: Diagnosis not present

## 2019-05-13 DIAGNOSIS — Z888 Allergy status to other drugs, medicaments and biological substances status: Secondary | ICD-10-CM | POA: Diagnosis not present

## 2019-05-13 DIAGNOSIS — O471 False labor at or after 37 completed weeks of gestation: Secondary | ICD-10-CM | POA: Diagnosis not present

## 2019-05-13 DIAGNOSIS — O479 False labor, unspecified: Secondary | ICD-10-CM

## 2019-05-13 DIAGNOSIS — O4703 False labor before 37 completed weeks of gestation, third trimester: Secondary | ICD-10-CM

## 2019-05-13 DIAGNOSIS — O212 Late vomiting of pregnancy: Secondary | ICD-10-CM

## 2019-05-13 DIAGNOSIS — Z79899 Other long term (current) drug therapy: Secondary | ICD-10-CM | POA: Insufficient documentation

## 2019-05-13 DIAGNOSIS — Z3A37 37 weeks gestation of pregnancy: Secondary | ICD-10-CM | POA: Insufficient documentation

## 2019-05-13 DIAGNOSIS — R112 Nausea with vomiting, unspecified: Secondary | ICD-10-CM

## 2019-05-13 DIAGNOSIS — O99513 Diseases of the respiratory system complicating pregnancy, third trimester: Secondary | ICD-10-CM | POA: Diagnosis not present

## 2019-05-13 DIAGNOSIS — O418X30 Other specified disorders of amniotic fluid and membranes, third trimester, not applicable or unspecified: Secondary | ICD-10-CM

## 2019-05-13 DIAGNOSIS — Z3490 Encounter for supervision of normal pregnancy, unspecified, unspecified trimester: Secondary | ICD-10-CM

## 2019-05-13 LAB — RUPTURE OF MEMBRANE (ROM)PLUS: Rom Plus: NEGATIVE

## 2019-05-13 MED ORDER — ACETAMINOPHEN 500 MG PO TABS
1000.0000 mg | ORAL_TABLET | Freq: Four times a day (QID) | ORAL | Status: DC | PRN
  Administered 2019-05-13: 1000 mg via ORAL
  Filled 2019-05-13: qty 2

## 2019-05-13 MED ORDER — ONDANSETRON 4 MG PO TBDP
4.0000 mg | ORAL_TABLET | Freq: Once | ORAL | Status: AC
  Administered 2019-05-13: 4 mg via ORAL
  Filled 2019-05-13: qty 1

## 2019-05-13 MED ORDER — HYDROXYZINE HCL 25 MG PO TABS
25.0000 mg | ORAL_TABLET | Freq: Three times a day (TID) | ORAL | Status: DC | PRN
  Administered 2019-05-13: 25 mg via ORAL
  Filled 2019-05-13 (×2): qty 1

## 2019-05-13 NOTE — Discharge Instructions (Signed)

## 2019-05-13 NOTE — OB Triage Note (Signed)
Patient complaining of  leaking of fluid since breakfast time this morning. Contractions worsening and stronger today, no vaginal bleeding. Positive fetal movement. Nausea present, no vomiting. Monitors applied, will assess.

## 2019-05-13 NOTE — Final Progress Note (Signed)
L&D OB Triage Note  HPI:  Brittany [name] is a 29 y.o. [id number] female at [redacted]w[redacted]d. Estimated Date of Delivery: 06/01/19 who presents for complaints of possible leakage of fluids since this morning (around breakfast time).  She notes contractions increasing in intensity today after this time. Has had 1 episode of nausea/vomiting while in triage.    OB History  Gravida Para Term Preterm AB Living  6 4 4   1 4   SAB TAB Ectopic Multiple Live Births  1     1 4     # Outcome Date GA Lbr Len/2nd Weight Sex Delivery Anes PTL Lv  6 Current           5A SAB 2020          5B Gravida           4 Term 09/03/13 [redacted]w[redacted]d 02:13 / 00:22 3141 g F Vag-Spont EPI  LIV  3 Term 12/07/11 [redacted]w[redacted]d / 00:04 3104 g F Vag-Spont None  LIV  2 Term 08/30/10 [redacted]w[redacted]d 03:25 / 12:30 3090 g M Vag-Spont None  LIV     Birth Comments: diabetic- oral meds  1 Term 06/16/07    Jerilynn Mages Vag-Spont   LIV    Patient Active Problem List   Diagnosis Date Noted  . Irregular uterine contractions 05/13/2019  . History of gestational diabetes in prior pregnancy, currently pregnant in second trimester 01/31/2019  . Carpal tunnel syndrome during pregnancy 01/31/2019  . Insufficient prenatal care in second trimester 01/31/2019  . Peritonsillar abscess 02/25/2018  . Asthma 02/25/2018  . Status post normal vaginal delivery 09/05/2013  . Other specified indication for care or intervention related to labor and delivery, unspecified as to episode of care 09/03/2013  . Pregnant 12/01/2011  . Elevated blood pressure complicating pregnancy, antepartum 12/01/2011    Past Medical History:  Diagnosis Date  . Asthma   . Chlamydia   . Gonorrhea   . Hx MRSA infection 2005  . NVD (normal vaginal delivery) 08/30/2010  . Trichimoniasis     No current facility-administered medications on file prior to encounter.   Current Outpatient Medications on File Prior to Encounter  Medication Sig Dispense Refill  . albuterol (PROVENTIL HFA;VENTOLIN HFA) 108 (90  Base) MCG/ACT inhaler Inhale 1-2 puffs into the lungs every 6 (six) hours as needed for wheezing or shortness of breath.    Marland Kitchen albuterol (VENTOLIN HFA) 108 (90 Base) MCG/ACT inhaler Inhale 2 puffs into the lungs every 6 (six) hours as needed for wheezing or shortness of breath. 18 g 3  . Prenatal Multivit-Min-Fe-FA (PRE-NATAL PO) Take by mouth.      Allergies  Allergen Reactions  . Iodine Other (See Comments)    blistering  . Sulfonamide Derivatives Other (See Comments)    blistering     ROS:  Review of Systems - good fetal movement. Denies vaginal bleeding. Possible leaking fluids, and more intense contractions (see HPI)   Physical Exam:  Blood pressure 127/83, pulse 94, temperature 97.9 F (36.6 C), temperature source Oral, resp. rate 16, height 5\' 5"  (1.651 m), weight 111.6 kg, last menstrual period 08/25/2018, unknown if currently breastfeeding. General appearance: alert and no distress. Overall appears comfortable. Abdomen: soft, non-tender; bowel sounds normal; no masses,  no organomegaly. Gravid. Pelvic: cervix 1.5/30/-3/bulging bag of membranes (minimal change from prior nurse exam on initial presentation). No gross pooling.  Extremities: extremities normal, atraumatic, no cyanosis or edema   NST INTERPRETATION: Indications: rule out uterine contractions  Mode: External Baseline  Rate (A): 155 bpm Variability: Moderate Accelerations: 15 x 15 Decelerations: None     Contraction Frequency (min): 3-5  Impression: reactive   Labs:  Results for orders placed or performed during the hospital encounter of 05/13/19  ROM Plus ([facility] only)  Result Value Ref Range   Rom Plus NEGATIVE      Assessment:  29 y.o. [id number] at [redacted]w[redacted]d with:  1.  Uterine contractions 2. Intact membranes 3. Nausea/vomiting  Plan:  1. Patient with uterine contractions, may be in very early latent stage of labor. Discussed that this stage can last hours to days. Minimal change over 2.5 hrs. Is  is minimal distress with contractions. Will treat with PO Tylenol and Vistaril. Can d/c home with labor precautions.  2. Given PO Zofran for nausea/vomiting.   Hildred Laser, MD [facility]

## 2019-05-13 NOTE — Progress Notes (Signed)
05/13/2019 11:56 PM  BP 127/83 (BP Location: Right Arm)   Pulse 94   Temp 97.9 F (36.6 C) (Oral)   Resp 16   Ht 5\' 5"  (165.1 cm)   Wt 111.6 kg   LMP 08/25/2018 Comment: short only 1-2 days  BMI 40.94 kg/m  Patient discharged per MD orders. Discharge instructions reviewed with patient and patient verbalized understanding. Labor sypmtpms discussed and patient verbalized understanding. Discharged via wheelchair escorted by nursing staff.  10/26/2018, RN

## 2019-05-16 ENCOUNTER — Ambulatory Visit (INDEPENDENT_AMBULATORY_CARE_PROVIDER_SITE_OTHER): Payer: Medicaid Other | Admitting: Obstetrics and Gynecology

## 2019-05-16 ENCOUNTER — Other Ambulatory Visit: Payer: Self-pay

## 2019-05-16 ENCOUNTER — Encounter: Payer: Self-pay | Admitting: Obstetrics and Gynecology

## 2019-05-16 VITALS — BP 113/75 | HR 90 | Wt 247.6 lb

## 2019-05-16 DIAGNOSIS — O99210 Obesity complicating pregnancy, unspecified trimester: Secondary | ICD-10-CM

## 2019-05-16 DIAGNOSIS — R102 Pelvic and perineal pain unspecified side: Secondary | ICD-10-CM

## 2019-05-16 DIAGNOSIS — O26893 Other specified pregnancy related conditions, third trimester: Secondary | ICD-10-CM

## 2019-05-16 DIAGNOSIS — O26899 Other specified pregnancy related conditions, unspecified trimester: Secondary | ICD-10-CM

## 2019-05-16 DIAGNOSIS — O99213 Obesity complicating pregnancy, third trimester: Secondary | ICD-10-CM

## 2019-05-16 DIAGNOSIS — Z3483 Encounter for supervision of other normal pregnancy, third trimester: Secondary | ICD-10-CM

## 2019-05-16 DIAGNOSIS — Z3A37 37 weeks gestation of pregnancy: Secondary | ICD-10-CM

## 2019-05-16 DIAGNOSIS — O09893 Supervision of other high risk pregnancies, third trimester: Secondary | ICD-10-CM

## 2019-05-16 LAB — POCT URINALYSIS DIPSTICK OB
Bilirubin, UA: NEGATIVE
Blood, UA: NEGATIVE
Glucose, UA: NEGATIVE
Ketones, UA: NEGATIVE
Nitrite, UA: NEGATIVE
POC,PROTEIN,UA: NEGATIVE
Spec Grav, UA: 1.01 (ref 1.010–1.025)
Urobilinogen, UA: 0.2 E.U./dL
pH, UA: 7 (ref 5.0–8.0)

## 2019-05-16 NOTE — Progress Notes (Signed)
ROB: Patient reports feeling lots of vaginal pain. Frustrated as she is ready for pregnancy to be done. Desires membrane sweeping, discussed that it is not recommended to attempt before [redacted] weeks gestation. Seen in triage on Sunday for contractions, ruled out for labor. Discussed labor precautions. RTC in 1 week.

## 2019-05-16 NOTE — Progress Notes (Signed)
ROB-Pt present for routine prenatal care. Pt stated that she is having a lot of vaginal pain.

## 2019-05-17 ENCOUNTER — Inpatient Hospital Stay
Admission: EM | Admit: 2019-05-17 | Discharge: 2019-05-19 | DRG: 806 | Disposition: A | Discharge status: Home / Self Care | Payer: Medicaid Other | Attending: Obstetrics and Gynecology | Admitting: Obstetrics and Gynecology

## 2019-05-17 ENCOUNTER — Encounter: Payer: Self-pay | Admitting: Obstetrics and Gynecology

## 2019-05-17 ENCOUNTER — Other Ambulatory Visit: Payer: Self-pay

## 2019-05-17 DIAGNOSIS — O9952 Diseases of the respiratory system complicating childbirth: Secondary | ICD-10-CM | POA: Diagnosis present

## 2019-05-17 DIAGNOSIS — O4290 Premature rupture of membranes, unspecified as to length of time between rupture and onset of labor, unspecified weeks of gestation: Secondary | ICD-10-CM | POA: Diagnosis present

## 2019-05-17 DIAGNOSIS — O99824 Streptococcus B carrier state complicating childbirth: Secondary | ICD-10-CM | POA: Diagnosis present

## 2019-05-17 DIAGNOSIS — Z8632 Personal history of gestational diabetes: Secondary | ICD-10-CM

## 2019-05-17 DIAGNOSIS — D649 Anemia, unspecified: Secondary | ICD-10-CM | POA: Diagnosis present

## 2019-05-17 DIAGNOSIS — O0932 Supervision of pregnancy with insufficient antenatal care, second trimester: Secondary | ICD-10-CM

## 2019-05-17 DIAGNOSIS — Z3A38 38 weeks gestation of pregnancy: Secondary | ICD-10-CM

## 2019-05-17 DIAGNOSIS — O4292 Full-term premature rupture of membranes, unspecified as to length of time between rupture and onset of labor: Principal | ICD-10-CM | POA: Diagnosis present

## 2019-05-17 DIAGNOSIS — F1290 Cannabis use, unspecified, uncomplicated: Secondary | ICD-10-CM | POA: Diagnosis present

## 2019-05-17 DIAGNOSIS — Z20822 Contact with and (suspected) exposure to covid-19: Secondary | ICD-10-CM | POA: Diagnosis present

## 2019-05-17 DIAGNOSIS — O99013 Anemia complicating pregnancy, third trimester: Secondary | ICD-10-CM | POA: Diagnosis present

## 2019-05-17 DIAGNOSIS — Z8614 Personal history of Methicillin resistant Staphylococcus aureus infection: Secondary | ICD-10-CM

## 2019-05-17 DIAGNOSIS — J45909 Unspecified asthma, uncomplicated: Secondary | ICD-10-CM | POA: Diagnosis present

## 2019-05-17 DIAGNOSIS — O9902 Anemia complicating childbirth: Secondary | ICD-10-CM | POA: Diagnosis present

## 2019-05-17 DIAGNOSIS — O09292 Supervision of pregnancy with other poor reproductive or obstetric history, second trimester: Secondary | ICD-10-CM

## 2019-05-17 DIAGNOSIS — O99324 Drug use complicating childbirth: Secondary | ICD-10-CM | POA: Diagnosis present

## 2019-05-17 DIAGNOSIS — Z8759 Personal history of other complications of pregnancy, childbirth and the puerperium: Secondary | ICD-10-CM

## 2019-05-17 NOTE — OB Triage Note (Signed)
Pt arrived in triage with c/o contractions that have progressively gotten more intense around 2030 and leaking of pink/mucous discharge that started around 2300. Reports good fetal movement. Denies vaginal bleeding. Reports contractions q 2-5 mins, rating pain 8/10. Monitors applied and assessing.

## 2019-05-18 ENCOUNTER — Inpatient Hospital Stay: Payer: Medicaid Other | Admitting: Anesthesiology

## 2019-05-18 ENCOUNTER — Encounter: Payer: Self-pay | Admitting: Obstetrics and Gynecology

## 2019-05-18 DIAGNOSIS — O99324 Drug use complicating childbirth: Secondary | ICD-10-CM | POA: Diagnosis present

## 2019-05-18 DIAGNOSIS — Z8759 Personal history of other complications of pregnancy, childbirth and the puerperium: Secondary | ICD-10-CM

## 2019-05-18 DIAGNOSIS — O4202 Full-term premature rupture of membranes, onset of labor within 24 hours of rupture: Secondary | ICD-10-CM

## 2019-05-18 DIAGNOSIS — D649 Anemia, unspecified: Secondary | ICD-10-CM | POA: Diagnosis present

## 2019-05-18 DIAGNOSIS — O99824 Streptococcus B carrier state complicating childbirth: Secondary | ICD-10-CM | POA: Diagnosis present

## 2019-05-18 DIAGNOSIS — J45909 Unspecified asthma, uncomplicated: Secondary | ICD-10-CM | POA: Diagnosis present

## 2019-05-18 DIAGNOSIS — Z20822 Contact with and (suspected) exposure to covid-19: Secondary | ICD-10-CM | POA: Diagnosis present

## 2019-05-18 DIAGNOSIS — O4290 Premature rupture of membranes, unspecified as to length of time between rupture and onset of labor, unspecified weeks of gestation: Secondary | ICD-10-CM | POA: Diagnosis present

## 2019-05-18 DIAGNOSIS — O9952 Diseases of the respiratory system complicating childbirth: Secondary | ICD-10-CM | POA: Diagnosis present

## 2019-05-18 DIAGNOSIS — F1290 Cannabis use, unspecified, uncomplicated: Secondary | ICD-10-CM | POA: Diagnosis present

## 2019-05-18 DIAGNOSIS — Z3A38 38 weeks gestation of pregnancy: Secondary | ICD-10-CM | POA: Diagnosis not present

## 2019-05-18 DIAGNOSIS — Z8614 Personal history of Methicillin resistant Staphylococcus aureus infection: Secondary | ICD-10-CM | POA: Diagnosis not present

## 2019-05-18 DIAGNOSIS — O4292 Full-term premature rupture of membranes, unspecified as to length of time between rupture and onset of labor: Secondary | ICD-10-CM | POA: Diagnosis present

## 2019-05-18 DIAGNOSIS — O9902 Anemia complicating childbirth: Secondary | ICD-10-CM | POA: Diagnosis present

## 2019-05-18 HISTORY — DX: Personal history of other complications of pregnancy, childbirth and the puerperium: Z87.59

## 2019-05-18 LAB — URINE DRUG SCREEN, QUALITATIVE (ARMC ONLY)
Amphetamines, Ur Screen: NOT DETECTED
Barbiturates, Ur Screen: NOT DETECTED
Benzodiazepine, Ur Scrn: NOT DETECTED
Cannabinoid 50 Ng, Ur ~~LOC~~: POSITIVE — AB
Cocaine Metabolite,Ur ~~LOC~~: NOT DETECTED
MDMA (Ecstasy)Ur Screen: NOT DETECTED
Methadone Scn, Ur: NOT DETECTED
Opiate, Ur Screen: NOT DETECTED
Phencyclidine (PCP) Ur S: NOT DETECTED
Tricyclic, Ur Screen: NOT DETECTED

## 2019-05-18 LAB — RUPTURE OF MEMBRANE (ROM)PLUS: Rom Plus: POSITIVE

## 2019-05-18 LAB — CBC
HCT: 28.5 % — ABNORMAL LOW (ref 36.0–46.0)
Hemoglobin: 9.9 g/dL — ABNORMAL LOW (ref 12.0–15.0)
MCH: 27.9 pg (ref 26.0–34.0)
MCHC: 34.7 g/dL (ref 30.0–36.0)
MCV: 80.3 fL (ref 80.0–100.0)
Platelets: 254 10*3/uL (ref 150–400)
RBC: 3.55 MIL/uL — ABNORMAL LOW (ref 3.87–5.11)
RDW: 13.5 % (ref 11.5–15.5)
WBC: 12.1 10*3/uL — ABNORMAL HIGH (ref 4.0–10.5)
nRBC: 0 % (ref 0.0–0.2)

## 2019-05-18 LAB — RPR: RPR Ser Ql: NONREACTIVE

## 2019-05-18 LAB — TYPE AND SCREEN
ABO/RH(D): O POS
Antibody Screen: NEGATIVE

## 2019-05-18 LAB — RESPIRATORY PANEL BY RT PCR (FLU A&B, COVID)
Influenza A by PCR: NEGATIVE
Influenza B by PCR: NEGATIVE
SARS Coronavirus 2 by RT PCR: NEGATIVE

## 2019-05-18 MED ORDER — CARBOPROST TROMETHAMINE 250 MCG/ML IM SOLN
INTRAMUSCULAR | Status: AC
  Filled 2019-05-18: qty 1

## 2019-05-18 MED ORDER — LIDOCAINE-EPINEPHRINE (PF) 1.5 %-1:200000 IJ SOLN
INTRAMUSCULAR | Status: DC | PRN
  Administered 2019-05-18: 3 mL via EPIDURAL

## 2019-05-18 MED ORDER — METHYLERGONOVINE MALEATE 0.2 MG PO TABS: 0.2000 mg | ORAL_TABLET | ORAL | Status: DC | PRN

## 2019-05-18 MED ORDER — DIPHENHYDRAMINE HCL 50 MG/ML IJ SOLN: 12.5000 mg | INTRAMUSCULAR | Status: DC | PRN

## 2019-05-18 MED ORDER — LACTATED RINGERS IV SOLN: INTRAVENOUS | Status: DC

## 2019-05-18 MED ORDER — SOD CITRATE-CITRIC ACID 500-334 MG/5ML PO SOLN: 30.0000 mL | ORAL | Status: DC | PRN

## 2019-05-18 MED ORDER — PRENATAL MULTIVITAMIN CH
1.0000 | ORAL_TABLET | Freq: Every day | ORAL | Status: DC
  Administered 2019-05-18 – 2019-05-19 (×2): 1 via ORAL
  Filled 2019-05-18 (×2): qty 1

## 2019-05-18 MED ORDER — AMMONIA AROMATIC IN INHA
RESPIRATORY_TRACT | Status: AC
  Filled 2019-05-18: qty 10

## 2019-05-18 MED ORDER — DIPHENHYDRAMINE HCL 25 MG PO CAPS: 25.0000 mg | ORAL_CAPSULE | Freq: Four times a day (QID) | ORAL | Status: DC | PRN

## 2019-05-18 MED ORDER — SODIUM CHLORIDE 0.9 % IV SOLN
INTRAVENOUS | Status: DC | PRN
  Administered 2019-05-18: 8 mL via EPIDURAL

## 2019-05-18 MED ORDER — DIBUCAINE (PERIANAL) 1 % EX OINT
1.0000 "application " | TOPICAL_OINTMENT | CUTANEOUS | Status: DC | PRN
  Administered 2019-05-18: 1 via RECTAL
  Filled 2019-05-18: qty 28

## 2019-05-18 MED ORDER — WITCH HAZEL-GLYCERIN EX PADS
1.0000 "application " | MEDICATED_PAD | CUTANEOUS | Status: DC | PRN
  Administered 2019-05-18: 1 via TOPICAL
  Filled 2019-05-18: qty 100

## 2019-05-18 MED ORDER — EPHEDRINE 5 MG/ML INJ
10.0000 mg | INTRAVENOUS | Status: DC | PRN
  Filled 2019-05-18: qty 2

## 2019-05-18 MED ORDER — BUTORPHANOL TARTRATE 1 MG/ML IJ SOLN
1.0000 mg | INTRAMUSCULAR | Status: DC | PRN
  Filled 2019-05-18: qty 1

## 2019-05-18 MED ORDER — LACTATED RINGERS IV SOLN: 500.0000 mL | Freq: Once | INTRAVENOUS | Status: DC

## 2019-05-18 MED ORDER — ONDANSETRON HCL 4 MG/2ML IJ SOLN: 4.0000 mg | Freq: Four times a day (QID) | INTRAMUSCULAR | Status: DC | PRN

## 2019-05-18 MED ORDER — FENTANYL 2.5 MCG/ML W/ROPIVACAINE 0.15% IN NS 100 ML EPIDURAL (ARMC): 12.0000 mL/h | EPIDURAL | Status: DC

## 2019-05-18 MED ORDER — OXYTOCIN 40 UNITS IN NORMAL SALINE INFUSION - SIMPLE MED
1.0000 m[IU]/min | INTRAVENOUS | Status: DC
  Administered 2019-05-18: 07:00:00 4 m[IU]/min via INTRAVENOUS

## 2019-05-18 MED ORDER — OXYTOCIN 10 UNIT/ML IJ SOLN
INTRAMUSCULAR | Status: AC
  Filled 2019-05-18: qty 2

## 2019-05-18 MED ORDER — PHENYLEPHRINE 40 MCG/ML (10ML) SYRINGE FOR IV PUSH (FOR BLOOD PRESSURE SUPPORT)
80.0000 ug | PREFILLED_SYRINGE | INTRAVENOUS | Status: DC | PRN
  Filled 2019-05-18: qty 10

## 2019-05-18 MED ORDER — OXYTOCIN 40 UNITS IN NORMAL SALINE INFUSION - SIMPLE MED
2.5000 [IU]/h | INTRAVENOUS | Status: DC
  Filled 2019-05-18: qty 1000

## 2019-05-18 MED ORDER — LACTATED RINGERS IV SOLN: 500.0000 mL | INTRAVENOUS | Status: DC | PRN

## 2019-05-18 MED ORDER — SENNOSIDES-DOCUSATE SODIUM 8.6-50 MG PO TABS
2.0000 | ORAL_TABLET | ORAL | Status: DC
  Administered 2019-05-18: 2 via ORAL
  Filled 2019-05-18: qty 2

## 2019-05-18 MED ORDER — LIDOCAINE HCL (PF) 1 % IJ SOLN
INTRAMUSCULAR | Status: DC | PRN
  Administered 2019-05-18: 3 mL

## 2019-05-18 MED ORDER — SIMETHICONE 80 MG PO CHEW: 80.0000 mg | CHEWABLE_TABLET | ORAL | Status: DC | PRN

## 2019-05-18 MED ORDER — FENTANYL 2.5 MCG/ML W/ROPIVACAINE 0.15% IN NS 100 ML EPIDURAL (ARMC)
EPIDURAL | Status: DC | PRN
  Administered 2019-05-18: 12 mL/h via EPIDURAL

## 2019-05-18 MED ORDER — ACETAMINOPHEN 325 MG PO TABS
650.0000 mg | ORAL_TABLET | ORAL | Status: DC | PRN
  Administered 2019-05-18 – 2019-05-19 (×5): 650 mg via ORAL
  Filled 2019-05-18 (×6): qty 2

## 2019-05-18 MED ORDER — OXYCODONE HCL 5 MG PO TABS
5.0000 mg | ORAL_TABLET | Freq: Once | ORAL | Status: AC
  Administered 2019-05-18: 14:00:00 5 mg via ORAL
  Filled 2019-05-18: qty 1

## 2019-05-18 MED ORDER — METHYLERGONOVINE MALEATE 0.2 MG/ML IJ SOLN
INTRAMUSCULAR | Status: AC
  Filled 2019-05-18: qty 1

## 2019-05-18 MED ORDER — ONDANSETRON HCL 4 MG PO TABS: 4.0000 mg | ORAL_TABLET | ORAL | Status: DC | PRN

## 2019-05-18 MED ORDER — OXYTOCIN BOLUS FROM INFUSION: 500.0000 mL | Freq: Once | INTRAVENOUS | Status: DC

## 2019-05-18 MED ORDER — IBUPROFEN 800 MG PO TABS
800.0000 mg | ORAL_TABLET | Freq: Four times a day (QID) | ORAL | Status: DC
  Administered 2019-05-18 – 2019-05-19 (×5): 800 mg via ORAL
  Filled 2019-05-18 (×5): qty 1

## 2019-05-18 MED ORDER — FENTANYL 2.5 MCG/ML W/ROPIVACAINE 0.15% IN NS 100 ML EPIDURAL (ARMC)
EPIDURAL | Status: AC
  Filled 2019-05-18: qty 100

## 2019-05-18 MED ORDER — MISOPROSTOL 200 MCG PO TABS
ORAL_TABLET | ORAL | Status: AC
  Filled 2019-05-18: qty 4

## 2019-05-18 MED ORDER — ONDANSETRON HCL 4 MG/2ML IJ SOLN: 4.0000 mg | INTRAMUSCULAR | Status: DC | PRN

## 2019-05-18 MED ORDER — LIDOCAINE HCL (PF) 1 % IJ SOLN
30.0000 mL | INTRAMUSCULAR | Status: DC | PRN
  Filled 2019-05-18: qty 30

## 2019-05-18 MED ORDER — ZOLPIDEM TARTRATE 5 MG PO TABS: 5.0000 mg | ORAL_TABLET | Freq: Every evening | ORAL | Status: DC | PRN

## 2019-05-18 MED ORDER — SODIUM CHLORIDE 0.9 % IV SOLN
1.0000 g | INTRAVENOUS | Status: DC
  Administered 2019-05-18: 07:00:00 1 g via INTRAVENOUS
  Filled 2019-05-18 (×5): qty 1000

## 2019-05-18 MED ORDER — BENZOCAINE-MENTHOL 20-0.5 % EX AERO
1.0000 "application " | INHALATION_SPRAY | CUTANEOUS | Status: DC | PRN
  Administered 2019-05-18: 1 via TOPICAL
  Filled 2019-05-18: qty 56

## 2019-05-18 MED ORDER — ACETAMINOPHEN 325 MG PO TABS: 650.0000 mg | ORAL_TABLET | ORAL | Status: DC | PRN

## 2019-05-18 MED ORDER — COCONUT OIL OIL
1.0000 "application " | TOPICAL_OIL | Status: DC | PRN
  Administered 2019-05-18: 1 via TOPICAL
  Filled 2019-05-18: qty 120

## 2019-05-18 MED ORDER — METHYLERGONOVINE MALEATE 0.2 MG/ML IJ SOLN: 0.2000 mg | INTRAMUSCULAR | Status: DC | PRN

## 2019-05-18 MED ORDER — SODIUM CHLORIDE 0.9 % IV SOLN
2.0000 g | Freq: Once | INTRAVENOUS | Status: AC
  Administered 2019-05-18: 2 g via INTRAVENOUS
  Filled 2019-05-18: qty 2000

## 2019-05-18 NOTE — H&P (Signed)
History and Physical   HPI  Brittany [name] is a 29 y.o. [id number] at [redacted]w[redacted]d Estimated Date of Delivery: 06/01/19 who is being admitted for PROM at term.  Rare contractions.   OB History  OB History  Gravida Para Term Preterm AB Living  6 4 4  0 1 4  SAB TAB Ectopic Multiple Live Births  1 0 0 1 4    # Outcome Date GA Lbr Len/2nd Weight Sex Delivery Anes PTL Lv  6 Current           5A SAB 2020          5B Gravida           4 Term 09/03/13 [redacted]w[redacted]d 02:13 / 00:22 3141 g F Vag-Spont EPI  LIV     Name: LEE-ANN, GAL     Apgar1: 9  Apgar5: 9  3 Term 12/07/11 [redacted]w[redacted]d / 00:04 3104 g F Vag-Spont None  LIV     Name: [facility]     Apgar1: 8  Apgar5: 9  2 Term 08/30/10 [redacted]w[redacted]d 03:25 / 12:30 3090 g M Vag-Spont None  LIV     Birth Comments: diabetic- oral meds     Name: SAMIKSHA, PELLICANO     Apgar1: 9  Apgar5: 9  1 Term 06/16/07    M Vag-Spont   LIV    PROBLEM LIST  Pregnancy complications or risks: Patient Active Problem List   Diagnosis Date Noted  . PROM (premature rupture of membranes) 05/18/2019  . Irregular uterine contractions 05/13/2019  . Supervision of normal pregnancy 05/13/2019  . History of gestational diabetes in prior pregnancy, currently pregnant in second trimester 01/31/2019  . Carpal tunnel syndrome during pregnancy 01/31/2019  . Insufficient prenatal care in second trimester 01/31/2019  . Peritonsillar abscess 02/25/2018  . Asthma 02/25/2018  . Status post normal vaginal delivery 09/05/2013  . Other specified indication for care or intervention related to labor and delivery, unspecified as to episode of care 09/03/2013  . Pregnant 12/01/2011  . Elevated blood pressure complicating pregnancy, antepartum 12/01/2011    Prenatal labs and studies: ABO, Rh: O/Positive/-- (09/03 1516) Antibody: Negative (09/03 1516) Rubella: 1.46 (09/03 1516) RPR: Non Reactive (02/03 0928)  HBsAg: Negative (09/03 1516)  HIV: Non Reactive (09/03 1516)    04-30-1999-- (03/18 1150)   Past Medical History:  Diagnosis Date  . Asthma   . Chlamydia   . Gonorrhea   . Hx MRSA infection 2005  . NVD (normal vaginal delivery) 08/30/2010  . Trichimoniasis      Past Surgical History:  Procedure Laterality Date  . MULTIPLE TOOTH EXTRACTIONS    . NO PAST SURGERIES       Medications    Current Discharge Medication List    CONTINUE these medications which have NOT CHANGED   Details  !! albuterol (PROVENTIL HFA;VENTOLIN HFA) 108 (90 Base) MCG/ACT inhaler Inhale 1-2 puffs into the lungs every 6 (six) hours as needed for wheezing or shortness of breath.    Prenatal Multivit-Min-Fe-FA (PRE-NATAL PO) Take by mouth.    !! albuterol (VENTOLIN HFA) 108 (90 Base) MCG/ACT inhaler Inhale 2 puffs into the lungs every 6 (six) hours as needed for wheezing or shortness of breath. Qty: 18 g, Refills: 3     !! - Potential duplicate medications found. Please discuss with provider.       Allergies  Iodine and Sulfonamide derivatives  Review of Systems  Pertinent items are noted in HPI.  Physical Exam  BP 130/80 (BP Location: Left Arm)   Pulse 82   Temp 98.3 F (36.8 C) (Oral)   Resp 16   Ht 5\' 5"  (1.651 m)   Wt 112.3 kg   LMP 08/25/2018 Comment: short only 1-2 days  BMI 41.20 kg/m   Lungs:  CTA B Cardio: RRR without M/R/G Abd: Soft, gravid, NT Presentation: cephalic EXT: No C/C/ 1+ Edema DTRs: 2+ B CERVIX: Dilation: 1.5 Effacement (%): 60 Cervical Position: Posterior Station: -3 Exam by:: Ane Payment RN  See Prenatal records for more detailed PE.     FHR:  Variability: Good {> 6 bpm)  Toco: Uterine Contractions: Irreg.  Test Results  Results for orders placed or performed during the hospital encounter of 05/17/19 (from the past 24 hour(s))  ROM Plus ([place] only)     Status: None   Collection Time: 05/17/19 11:53 PM  Result Value Ref Range   Rom Plus POSITIVE    Group B Strep positive  Assessment   [id number]  at [redacted]w[redacted]d Estimated Date of Delivery: 06/01/19  The fetus is reassuring.   Patient Active Problem List   Diagnosis Date Noted  . PROM (premature rupture of membranes) 05/18/2019  . Irregular uterine contractions 05/13/2019  . Supervision of normal pregnancy 05/13/2019  . History of gestational diabetes in prior pregnancy, currently pregnant in second trimester 01/31/2019  . Carpal tunnel syndrome during pregnancy 01/31/2019  . Insufficient prenatal care in second trimester 01/31/2019  . Peritonsillar abscess 02/25/2018  . Asthma 02/25/2018  . Status post normal vaginal delivery 09/05/2013  . Other specified indication for care or intervention related to labor and delivery, unspecified as to episode of care 09/03/2013  . Pregnant 12/01/2011  . Elevated blood pressure complicating pregnancy, antepartum 12/01/2011    Plan  1. Admit to L&D :   2. EFM: -- Category 1 3. Stadol or Epidural if desired.   4. Admission labs  5. GBS  ABX  Finis Bud, M.D. 05/18/2019 12:48 AM

## 2019-05-18 NOTE — Anesthesia Preprocedure Evaluation (Signed)
Anesthesia Evaluation  Patient identified by MRN, date of birth, ID band Patient awake    Reviewed: Allergy & Precautions, NPO status , Patient's Chart, lab work & pertinent test results  History of Anesthesia Complications Negative for: history of anesthetic complications  Airway Mallampati: II  TM Distance: >3 FB Neck ROM: Full    Dental no notable dental hx. (+) Teeth Intact   Pulmonary asthma , neg sleep apnea, neg COPD, Patient abstained from smoking.Not current smoker, former smoker,    Pulmonary exam normal breath sounds clear to auscultation       Cardiovascular Exercise Tolerance: Good METS(-) hypertension(-) CAD and (-) Past MI negative cardio ROS  (-) dysrhythmias  Rhythm:Regular Rate:Normal - Systolic murmurs    Neuro/Psych negative neurological ROS  negative psych ROS   GI/Hepatic neg GERD  ,(+)     (-) substance abuse  ,   Endo/Other  neg diabetes  Renal/GU negative Renal ROS     Musculoskeletal   Abdominal   Peds  Hematology   Anesthesia Other Findings Past Medical History: No date: Asthma No date: Chlamydia No date: Gonorrhea 2005: Hx MRSA infection 08/30/2010: NVD (normal vaginal delivery) No date: Trichimoniasis  Reproductive/Obstetrics (+) Pregnancy                             Anesthesia Physical Anesthesia Plan  ASA: III  Anesthesia Plan: Epidural   Post-op Pain Management:    Induction:   PONV Risk Score and Plan: 3 and Treatment may vary due to age or medical condition and Ondansetron  Airway Management Planned: Natural Airway  Additional Equipment:   Intra-op Plan:   Post-operative Plan:   Informed Consent: I have reviewed the patients History and Physical, chart, labs and discussed the procedure including the risks, benefits and alternatives for the proposed anesthesia with the patient or authorized representative who has indicated his/her  understanding and acceptance.       Plan Discussed with: Surgeon  Anesthesia Plan Comments: (Discussed R/B/A of neuraxial anesthesia technique with patient: - rare risks of spinal/epidural hematoma, nerve damage, infection - Risk of PDPH - Risk of itching - Risk of nausea and vomiting - Risk of poor block necessitating replacement of epidural. Patient voiced understanding.)        Anesthesia Quick Evaluation

## 2019-05-18 NOTE — Anesthesia Procedure Notes (Signed)
Epidural Patient location during procedure: OB Start time: 05/18/2019 3:20 AM End time: 05/18/2019 3:45 AM  Staffing Anesthesiologist: Corinda Gubler, MD Performed: anesthesiologist   Preanesthetic Checklist Completed: patient identified, IV checked, site marked, risks and benefits discussed, surgical consent, monitors and equipment checked, pre-op evaluation and timeout performed  Epidural Patient position: sitting Prep: ChloraPrep Patient monitoring: heart rate, continuous pulse ox and blood pressure Approach: midline Location: L3-L4 Injection technique: LOR saline  Needle:  Needle type: Tuohy  Needle gauge: 17 G Needle length: 9 cm and 9 Needle insertion depth: 7 cm Catheter type: closed end flexible Catheter size: 19 Gauge Catheter at skin depth: 12 cm Test dose: negative and 1.5% lidocaine with Epi 1:200 K  Assessment Sensory level: T10 Events: blood not aspirated, injection not painful, no injection resistance, no paresthesia and negative IV test  Additional Notes 1st attempt Pt. Evaluated and documentation done after procedure finished. Patient identified. Risks/Benefits/Options discussed with patient including but not limited to bleeding, infection, nerve damage, paralysis, failed block, incomplete pain control, headache, blood pressure changes, nausea, vomiting, reactions to medication both or allergic, itching and postpartum back pain. Confirmed with bedside nurse the patient's most recent platelet count. Confirmed with patient that they are not currently taking any anticoagulation, have any bleeding history or any family history of bleeding disorders. Patient expressed understanding and wished to proceed. All questions were answered. Sterile technique was used throughout the entire procedure. Please see nursing notes for vital signs. Test dose was given through epidural catheter and negative prior to continuing to dose epidural or start infusion. Warning signs of high block  given to the patient including shortness of breath, tingling/numbness in hands, complete motor block, or any concerning symptoms with instructions to call for help. Patient was given instructions on fall risk and not to get out of bed. All questions and concerns addressed with instructions to call with any issues or inadequate analgesia.   Patient tolerated the insertion well without immediate complications.Reason for block:procedure for pain

## 2019-05-19 ENCOUNTER — Ambulatory Visit: Payer: Self-pay

## 2019-05-19 LAB — CBC
HCT: 25.4 % — ABNORMAL LOW (ref 36.0–46.0)
Hemoglobin: 8.7 g/dL — ABNORMAL LOW (ref 12.0–15.0)
MCH: 27.5 pg (ref 26.0–34.0)
MCHC: 34.3 g/dL (ref 30.0–36.0)
MCV: 80.4 fL (ref 80.0–100.0)
Platelets: 213 10*3/uL (ref 150–400)
RBC: 3.16 MIL/uL — ABNORMAL LOW (ref 3.87–5.11)
RDW: 13.6 % (ref 11.5–15.5)
WBC: 12.2 10*3/uL — ABNORMAL HIGH (ref 4.0–10.5)
nRBC: 0 % (ref 0.0–0.2)

## 2019-05-19 MED ORDER — FERROUS SULFATE 325 (65 FE) MG PO TABS: 325.0000 mg | ORAL_TABLET | Freq: Two times a day (BID) | ORAL | 1 refills | Status: DC

## 2019-05-19 MED ORDER — OXYCODONE HCL 5 MG PO TABS
10.0000 mg | ORAL_TABLET | Freq: Once | ORAL | Status: AC
  Administered 2019-05-19: 10 mg via ORAL
  Filled 2019-05-19: qty 2

## 2019-05-19 MED ORDER — IBUPROFEN 800 MG PO TABS: 800.0000 mg | ORAL_TABLET | Freq: Three times a day (TID) | ORAL | 1 refills | Status: DC | PRN

## 2019-05-19 MED ORDER — DOCUSATE SODIUM 100 MG PO CAPS: 100.0000 mg | ORAL_CAPSULE | Freq: Two times a day (BID) | ORAL | 2 refills | Status: DC | PRN

## 2019-05-19 NOTE — Clinical Social Work Maternal (Signed)
CLINICAL SOCIAL WORK MATERNAL/CHILD NOTE  Patient Details  Name: Brittany [name] MRN: [id number] Date of Birth: [birth year]  Date:  05/19/2019  Clinical Social Worker Initiating Note:  Brittany [name], MSW, LCSW-A Date/Time: Initiated:  05/19/19/0924     Child's Name:  Brittany [name]   Biological Parents:  Mother, Father   Need for Interpreter:  None   Reason for Referral:  Current Substance Use/Substance Use During Pregnancy    Address:  [place] [address]    Phone number:  [phone] (home)     Additional phone number:   Household Members/Support Persons (HM/SP):   Household Member/Support Person 1, Household Member/Support Person 2, Household Member/Support Person 3   HM/SP Name Relationship DOB or Age  HM/SP -1 Brittany [name] Daughter 09/03/2013  HM/SP -2 Brittany [name] Daughter 12/07/2011  HM/SP -3 Brittany [name] Son 08/30/2010  HM/SP -4        HM/SP -5        HM/SP -6        HM/SP -7        HM/SP -8          Natural Supports (not living in the home):  Spouse/significant other   Professional Supports: None   Employment: Unemployed   Type of Work:     Education:  Programmer, [name]   Homebound arranged:    Museum/gallery curator Resources:  [employer]   Other Resources:  [employer], [employer] , Physicist, medical    Cultural/Religious Considerations Which May Impact Care:  [employer]  Strengths:  Ability to meet basic needs , Engineer, materials, Home prepared for child    Psychotropic Medications:         Pediatrician:    [employer] area  Pediatrician List:   Lavella Hammock, [place]      Pediatrician Fax Number:    Risk Factors/Current Problems:  None   Cognitive State:  Alert    Mood/Affect:  Calm , Comfortable    CSW Assessment: CSW met with MOB and newborn at bedside to complete discussion. CSW explained hospital drug screening policies  to MOB due to her history of marijuana use and mandated reporting. CSW informed MOB that the newborn was positive for marijuana - MOB did not have questions or concerns regarding testing or report. MOB reports the newborn's name is Brittany [name]. MOB denies any history of CPS involvement. MOB reports she has chosen Brittany [name] in [place] for pediatric care. MOB reports having a car seat for newborn to utilize for safe transportation. MOB reports the newborn will sleep in a bassinet at home. Safe sleep and SIDS precautions were reviewed. MOB reports this is her 5th child and that she has physical custody of all of them. MOB reports she receives [facility], food stamps, and section 8 housing. MOB reports she is a high school graduate but is unemployed. MOB reports having all items at home needed for newborn care. MOB denies any domestic violence. MOB is currently bottle feeding the newborn. MOB reports that FOB is involved but does not live in the home.  CSW attempted to reach on call DSS worker twice without success. CSW will continue attempting to make report to [place] for substance exposure. No barriers to discharge, CPS will follow up with MOB after discharge.  CSW Plan/Description:  CSW Will Continue to Monitor Umbilical Cord Tissue Drug Screen  Results and Make Report if Brittany Counts, LCSW 05/19/2019, 9:32 AM

## 2019-05-19 NOTE — Discharge Summary (Signed)
Postpartum Discharge Summary     Patient Name: Brittany [name] DOB: Feb 12, 1991 MRN: [id number]  Date of admission: 05/17/2019 Delivering Provider: Rubie Maid   Date of discharge: 05/19/2019  Admitting diagnosis: PROM (premature rupture of membranes) [O42.90] Intrauterine pregnancy: [redacted]w[redacted]d     Secondary diagnosis:  Active Problems:   History of gestational diabetes in prior pregnancy, currently pregnant in second trimester   Insufficient prenatal care in second trimester   PROM (premature rupture of membranes)   History of postpartum hemorrhage   Anemia of pregnancy  Additional problems: Marijuana use in pregnancy     Discharge diagnosis: Term Pregnancy Delivered, Anemia and PPH                                                                                                Post partum procedures: None  Augmentation: Pitocin  Complications: [id number]  Hospital course:  Induction of Labor With Vaginal Delivery   29 y.o. yo [id number] at [redacted]w[redacted]d was admitted to the hospital 05/17/2019 for induction of labor.  Indication for induction: PROM.  Patient had an uncomplicated labor course as follows: Membrane Rupture Time/Date: 11:00 PM ,05/17/2019   Intrapartum Procedures: Episiotomy: None [1]                                         Lacerations:    Patient had delivery of a Viable infant.  Information for the patient's newborn:  Shalon, Salado [[id number]]  Delivery Method: Vag-Spont    05/18/2019  Details of delivery can be found in separate delivery note.  Patient had a routine postpartum course. Patient is discharged home 05/19/19. Delivery time: 9:05 AM     Physical exam  Vitals:   05/18/19 1604 05/18/19 2030 05/18/19 2352 05/19/19 0806  BP: (!) 136/97 122/71 (!) 147/96 124/87  Pulse: 77 63 61 72  Resp: 18 18 18 18   Temp: 98 F (36.7 C) 97.8 F (36.6 C) 98.8 F (37.1 C) 98 F (36.7 C)  TempSrc: Oral Oral Oral Oral  SpO2: 98% 100% 98% 99%  Weight:       Height:       General: alert and no distress Lochia: appropriate Uterine Fundus: firm Incision: N/A DVT Evaluation: No evidence of DVT seen on physical exam. Negative Homan's sign. No cords or calf tenderness. No significant calf/ankle edema. Labs: Lab Results  Component Value Date   WBC 12.2 (H) 05/19/2019   HGB 8.7 (L) 05/19/2019   HCT 25.4 (L) 05/19/2019   MCV 80.4 05/19/2019   PLT 213 05/19/2019   CMP Latest Ref Rng & Units 02/26/2018  Glucose 70 - 99 mg/dL 146(H)  BUN 6 - 20 mg/dL 7  Creatinine 0.44 - 1.00 mg/dL 0.61  Sodium 135 - 145 mmol/L 135  Potassium 3.5 - 5.1 mmol/L 3.9  Chloride 98 - 111 mmol/L 102  CO2 22 - 32 mmol/L 23  Calcium 8.9 - 10.3 mg/dL 9.3  Total Protein 6.5 - 8.1 g/dL -  Total Bilirubin 0.3 -  1.2 mg/dL -  Alkaline Phos 38 - 937 U/L -  AST 15 - 41 U/L -  ALT 14 - 54 U/L -   Edinburgh Score: Edinburgh Postnatal Depression Scale Screening Tool 05/19/2019  I have been able to laugh and see the funny side of things. 0  I have looked forward with enjoyment to things. 0  I have blamed myself unnecessarily when things went wrong. 0  I have been anxious or worried for no good reason. 0  I have felt scared or panicky for no good reason. 0  Things have been getting on top of me. 0  I have been so unhappy that I have had difficulty sleeping. 0  I have felt sad or miserable. 0  I have been so unhappy that I have been crying. 0  The thought of harming myself has occurred to me. 0  Edinburgh Postnatal Depression Scale Total 0    Discharge instruction: per After Visit Summary and "Baby and Me Booklet".  After visit meds:  Allergies as of 05/19/2019       Reactions   Iodine Other (See Comments)   blistering   Sulfonamide Derivatives Other (See Comments)   blistering        Medication List     TAKE these medications    albuterol 108 (90 Base) MCG/ACT inhaler Commonly known as: VENTOLIN HFA Inhale 1-2 puffs into the lungs every 6 (six) hours as  needed for wheezing or shortness of breath.   albuterol 108 (90 Base) MCG/ACT inhaler Commonly known as: VENTOLIN HFA Inhale 2 puffs into the lungs every 6 (six) hours as needed for wheezing or shortness of breath.   docusate sodium 100 MG capsule Commonly known as: COLACE Take 1 capsule (100 mg total) by mouth 2 (two) times daily as needed.   ferrous sulfate 325 (65 FE) MG tablet Commonly known as: FerrouSul Take 1 tablet (325 mg total) by mouth 2 (two) times daily.   ibuprofen 800 MG tablet Commonly known as: ADVIL Take 1 tablet (800 mg total) by mouth every 8 (eight) hours as needed.   PRE-NATAL PO Take by mouth.        Diet: routine diet  Activity: Advance as tolerated. Pelvic rest for 6 weeks.   Outpatient follow up:6 weeks Follow up Appt: Future Appointments  Date Time Provider Department Center  05/25/2019  9:30 AM Linzie Collin, MD [facility] None   Follow up Visit:    Please schedule this patient for Postpartum visit in: 6 weeks with the following provider: MD In-Person  Low risk pregnancy complicated by:  postpartum hemorrhage Delivery mode:  SVD Anticipated Birth Control:  Nexplanon PP Procedures needed:  None   Schedule Integrated BH visit: no     Newborn Data: Live born female  Birth Weight: 7 lb 1.9 oz (3230 g) APGAR: 9, 9  Newborn Delivery   Birth date/time: 05/18/2019 09:05:00 Delivery type: Vaginal, Spontaneous      Baby Feeding: Breast Disposition:home with mother   05/19/2019 Hildred Laser, MD

## 2019-05-19 NOTE — Progress Notes (Signed)
Post Partum Day # 1, s/p SVD. Postpartum hemorrhage.   Subjective: up ad lib, voiding and tolerating PO.  Still noting significant pelvic pain, not relieved by Tylenol or Motrin.  Objective: Temp:  [97.8 F (36.6 C)-98.8 F (37.1 C)] 98 F (36.7 C) (04/03 0806) Pulse Rate:  [61-80] 72 (04/03 0806) Resp:  [17-18] 18 (04/03 0806) BP: (122-147)/(71-97) 124/87 (04/03 0806) SpO2:  [98 %-100 %] 99 % (04/03 0806)  Physical Exam:  General: alert and no distress  Lungs: clear to auscultation bilaterally Breasts: normal appearance, no masses or tenderness Heart: regular rate and rhythm, S1, S2 normal, no murmur, click, rub or gallop Abdomen: soft, non-tender; bowel sounds normal; no masses,  no organomegaly Pelvis: Lochia: appropriate, Uterine Fundus: firm Extremities: DVT Evaluation: No evidence of DVT seen on physical exam. Negative Homan's sign. No cords or calf tenderness. No significant calf/ankle edema.   Recent Labs    05/18/19 0051 05/19/19 0546  HGB 9.9* 8.7*  HCT 28.5* 25.4*    Assessment/Plan: Breastfeeding, Lactation consult.  Social Work consult for h/o marijuana use in pregnancy Contraception: no longer desires BTL. Will get Nexplanon postpartum.  Anemia of pregnancy, now with postpartum hemorrhage. Patient with asymptomatic moderate anemia.  Will give 1 dose of oxycodone now.  Can d/c home later today, per patient preference.    LOS: 1 day   Hildred Laser, MD [facility] 05/19/2019 9:09 AM

## 2019-05-19 NOTE — Anesthesia Postprocedure Evaluation (Signed)
Anesthesia Post Note  Patient: ANAYIA EUGENE  Procedure(s) Performed: AN AD HOC LABOR EPIDURAL  Patient location during evaluation: Mother Baby Anesthesia Type: Epidural Level of consciousness: awake and alert Pain management: pain level controlled Vital Signs Assessment: post-procedure vital signs reviewed and stable Respiratory status: spontaneous breathing, nonlabored ventilation and respiratory function stable Cardiovascular status: stable Postop Assessment: no headache, no backache, able to ambulate, adequate PO intake and no apparent nausea or vomiting Anesthetic complications: no     Last Vitals:  Vitals:   05/18/19 2352 05/19/19 0806  BP: (!) 147/96 124/87  Pulse: 61 72  Resp: 18 18  Temp: 37.1 C 36.7 C  SpO2: 98% 99%    Last Pain:  Vitals:   05/19/19 0806  TempSrc: Oral  PainSc:                  Lenard Simmer

## 2019-05-19 NOTE — Progress Notes (Signed)
Discharge instructions complete and prescriptions sent to the pharmacy by the provider. Patient verbalizes understanding of teaching. Patient discharged home via wheelchair at 1445.

## 2019-05-19 NOTE — Lactation Note (Addendum)
This note was copied from a baby's chart. Lactation Consultation Note  Patient Name: Brittany [name] [id number] Date: 05/19/2019 Reason for consult: Follow-up assessment;Early term 37-38.6wks;Other (Comment)  Observed last breast feeding before being discharged.  [place] latched without assistance and begins strong, rhythmic sucking with audible swallows.  Mom reports breasts feeling fuller.  Mom had tried to give her a bottle earlier because she was cluster feeding.  Mom voiced that she did not like bottle.  Mom reports with her last baby that she could not get her to take a bottle either and that is why she ended up breast feeding for 16 months. This is mom's 7th baby under 35 years old.  Mom was positive for MJ 05/18/2019 on admission and baby tested positive as well.  Counseled on risks of MJ while breast feeding.  Mom had requested a DEBP kit in case she got a pump through [facility] later.  Mom expressed tender nipples since [facility] is cluster feeding.  Demonstrated hand expression and rubbing on nipples.  Coconut oil and comfort gels given and instructed in alternating use.  Discussed breast massage, hand expression, pumping, collection, storage, cleaning, labeling and handling of expressed milk.  Reviewed normal newborn stomach size, feeding cues, supply and demand, normal course of lactation and routine newborn feeding patterns.  Lactation Geophysicist/field seismologist given with contact numbers and reviewed encouraging mom to call with any questions, concerns or assistance.      Maternal Data Formula Feeding for Exclusion: No Has patient been taught Hand Expression?: Yes Does the patient have breastfeeding experience prior to this delivery?: Yes  Feeding Feeding Type: Breast Fed  LATCH Score Latch: Grasps breast easily, tongue down, lips flanged, rhythmical sucking.  Audible Swallowing: Spontaneous and intermittent  Type of Nipple: Everted at rest and after stimulation  Comfort  (Breast/Nipple): Filling, red/small blisters or bruises, mild/mod discomfort  Hold (Positioning): No assistance needed to correctly position infant at breast.  LATCH Score: 9  Interventions    Lactation Tools Discussed/Used WIC Program: Yes Pump Review: Setup, frequency, and cleaning;Milk Storage;Other (comment) Initiated by:: S.Maeli Spacek,RN,BSN,IBCLC Date initiated:: 05/18/19   Consult Status Consult Status: PRN    Jarold Motto 05/19/2019, 5:14 PM

## 2019-05-25 ENCOUNTER — Encounter: Payer: Medicaid Other | Admitting: Obstetrics and Gynecology

## 2019-06-28 ENCOUNTER — Other Ambulatory Visit: Payer: Self-pay

## 2019-06-28 ENCOUNTER — Encounter: Payer: Self-pay | Admitting: Obstetrics and Gynecology

## 2019-06-28 ENCOUNTER — Ambulatory Visit (INDEPENDENT_AMBULATORY_CARE_PROVIDER_SITE_OTHER): Payer: Medicaid Other | Admitting: Obstetrics and Gynecology

## 2019-06-28 DIAGNOSIS — N926 Irregular menstruation, unspecified: Secondary | ICD-10-CM

## 2019-06-28 DIAGNOSIS — O9081 Anemia of the puerperium: Secondary | ICD-10-CM

## 2019-06-28 LAB — POCT URINE PREGNANCY: Preg Test, Ur: NEGATIVE

## 2019-06-28 MED ORDER — NORETHINDRONE 0.35 MG PO TABS: 1.0000 | ORAL_TABLET | Freq: Every day | ORAL | 3 refills | Status: DC

## 2019-06-28 NOTE — Progress Notes (Signed)
   OBSTETRICS POSTPARTUM CLINIC PROGRESS NOTE  Subjective:     STEPHAIE [name] is a 29 y.o. [phone] female who presents for a postpartum visit. She is 6 weeks postpartum following a spontaneous vaginal delivery. I have fully reviewed the prenatal and intrapartum course. The delivery was at 38 gestational weeks.  Anesthesia: epidural. Postpartum course has been well. Baby's course has been well. Baby is feeding by breast and formula [facility]). Bleeding: patient has not resumed menses, with No LMP recorded.. Bowel function is normal. Bladder function is normal. Patient is sexually active. Contraception method desired is OCP (estrogen/progesterone), currently using condoms. Postpartum depression screening: negative, EDPS = 0.  The following portions of the patient's history were reviewed and updated as appropriate: allergies, current medications, past family history, past medical history, past social history, past surgical history and problem list.  Review of Systems Pertinent items noted in HPI and remainder of comprehensive ROS otherwise negative.   Objective:    BP 116/81   Pulse 80   Ht 5\' 5"  (1.651 m)   Wt 227 lb 9.6 oz (103.2 kg)   Breastfeeding Yes   BMI 37.87 kg/m   General:  alert and no distress   Breasts:  inspection negative, no nipple discharge or bleeding, no masses or nodularity palpable  Lungs: clear to auscultation bilaterally  Heart:  regular rate and rhythm, S1, S2 normal, no murmur, click, rub or gallop  Abdomen: soft, non-tender; bowel sounds normal; no masses,  no organomegaly.     Vulva:  normal  Vagina: normal vagina, small amount of thin discharge, exudate, lesion, or erythema  Cervix:  no cervical motion tenderness and no lesions  Corpus: normal size, contour, position, consistency, mobility, non-tender  Adnexa:  normal adnexa and no mass, fullness, tenderness  Rectal Exam: Not performed.         Labs:  Lab Results  Component Value Date   HGB  8.7 (L) 05/19/2019     Assessment:   1. Routine postpartum exam.  2. Postpartum anemia 3. Contraception management  Plan:    1. Contraception: oral progesterone-only contraceptive.   2. Will check Hgb for h/o anemia.  3. Follow up in: 6 months for annual exam, or sooner as needed.    07/19/2019, MD [facility]

## 2019-06-28 NOTE — Progress Notes (Signed)
   PT is present today for her postpartum visit. Pt stated that she is breastfeeding and have had sexually intercourse recently but used a condom. Pt stated that she is unsure about what type of birth control she wants. EPDS= 0.  Pt stated that she is doing well no complaints.

## 2019-06-28 NOTE — Patient Instructions (Signed)

## 2019-12-01 ENCOUNTER — Encounter (HOSPITAL_COMMUNITY): Payer: Self-pay | Admitting: Family Medicine

## 2019-12-01 ENCOUNTER — Inpatient Hospital Stay (HOSPITAL_COMMUNITY)
Admission: AD | Admit: 2019-12-01 | Discharge: 2019-12-01 | Disposition: A | Discharge status: Home / Self Care | Payer: Medicaid Other | Attending: Family Medicine | Admitting: Family Medicine

## 2019-12-01 ENCOUNTER — Other Ambulatory Visit: Payer: Self-pay

## 2019-12-01 DIAGNOSIS — Z3201 Encounter for pregnancy test, result positive: Secondary | ICD-10-CM | POA: Insufficient documentation

## 2019-12-01 NOTE — MAU Note (Signed)
Brittany [name] is a 29 y.o. at Unknown here in MAU reporting:  +HPT this am LMP: patient unsure Patient states that she is here to confirm that she is pregnant to see how far along she is. Vitals:   12/01/19 1411  BP: 115/77  Pulse: (!) 102  Resp: 18  Temp: 98.9 F (37.2 C)  SpO2: 99%    Lab orders placed from triage: none

## 2019-12-01 NOTE — Discharge Instructions (Signed)
First Trimester of Pregnancy  The first trimester of pregnancy is from week 1 until the end of week 13 (months 1 through 3). During this time, your baby will begin to develop inside you. At 6-8 weeks, the eyes and face are formed, and the heartbeat can be seen on ultrasound. At the end of 12 weeks, all the baby's organs are formed. Prenatal care is all the medical care you receive before the birth of your baby. Make sure you get good prenatal care and follow all of your doctor's instructions. Follow these instructions at home: Medicines  Take over-the-counter and prescription medicines only as told by your doctor. Some medicines are safe and some medicines are not safe during pregnancy.  Take a prenatal vitamin that contains at least 600 micrograms (mcg) of folic acid.  If you have trouble pooping (constipation), take medicine that will make your stool soft (stool softener) if your doctor approves. Eating and drinking   Eat regular, healthy meals.  Your doctor will tell you the amount of weight gain that is right for you.  Avoid raw meat and uncooked cheese.  If you feel sick to your stomach (nauseous) or throw up (vomit): ? Eat 4 or 5 small meals a day instead of 3 large meals. ? Try eating a few soda crackers. ? Drink liquids between meals instead of during meals.  To prevent constipation: ? Eat foods that are high in fiber, like fresh fruits and vegetables, whole grains, and beans. ? Drink enough fluids to keep your pee (urine) clear or pale yellow. Activity  Exercise only as told by your doctor. Stop exercising if you have cramps or pain in your lower belly (abdomen) or low back.  Do not exercise if it is too hot, too humid, or if you are in a place of great height (high altitude).  Try to avoid standing for long periods of time. Move your legs often if you must stand in one place for a long time.  Avoid heavy lifting.  Wear low-heeled shoes. Sit and stand up  straight.  You can have sex unless your doctor tells you not to. Relieving pain and discomfort  Wear a good support bra if your breasts are sore.  Take warm water baths (sitz baths) to soothe pain or discomfort caused by hemorrhoids. Use hemorrhoid cream if your doctor says it is okay.  Rest with your legs raised if you have leg cramps or low back pain.  If you have puffy, bulging veins (varicose veins) in your legs: ? Wear support hose or compression stockings as told by your doctor. ? Raise (elevate) your feet for 15 minutes, 3-4 times a day. ? Limit salt in your food. Prenatal care  Schedule your prenatal visits by the twelfth week of pregnancy.  Write down your questions. Take them to your prenatal visits.  Keep all your prenatal visits as told by your doctor. This is important. Safety  Wear your seat belt at all times when driving.  Make a list of emergency phone numbers. The list should include numbers for family, friends, the hospital, and police and fire departments. General instructions  Ask your doctor for a referral to a local prenatal class. Begin classes no later than at the start of month 6 of your pregnancy.  Ask for help if you need counseling or if you need help with nutrition. Your doctor can give you advice or tell you where to go for help.  Do not use hot tubs, steam   rooms, or saunas.  Do not douche or use tampons or scented sanitary pads.  Do not cross your legs for long periods of time.  Avoid all herbs and alcohol. Avoid drugs that are not approved by your doctor.  Do not use any tobacco products, including cigarettes, chewing tobacco, and electronic cigarettes. If you need help quitting, ask your doctor. You may get counseling or other support to help you quit.  Avoid cat litter boxes and soil used by cats. These carry germs that can cause birth defects in the baby and can cause a loss of your baby (miscarriage) or stillbirth.  Visit your dentist.  At home, brush your teeth with a soft toothbrush. Be gentle when you floss. Contact a doctor if:  You are dizzy.  You have mild cramps or pressure in your lower belly.  You have a nagging pain in your belly area.  You continue to feel sick to your stomach, you throw up, or you have watery poop (diarrhea).  You have a bad smelling fluid coming from your vagina.  You have pain when you pee (urinate).  You have increased puffiness (swelling) in your face, hands, legs, or ankles. Get help right away if:  You have a fever.  You are leaking fluid from your vagina.  You have spotting or bleeding from your vagina.  You have very bad belly cramping or pain.  You gain or lose weight rapidly.  You throw up blood. It may look like coffee grounds.  You are around people who have German measles, fifth disease, or chickenpox.  You have a very bad headache.  You have shortness of breath.  You have any kind of trauma, such as from a fall or a car accident. Summary  The first trimester of pregnancy is from week 1 until the end of week 13 (months 1 through 3).  To take care of yourself and your unborn baby, you will need to eat healthy meals, take medicines only if your doctor tells you to do so, and do activities that are safe for you and your baby.  Keep all follow-up visits as told by your doctor. This is important as your doctor will have to ensure that your baby is healthy and growing well. This information is not intended to replace advice given to you by your health care provider. Make sure you discuss any questions you have with your health care provider. Document Revised: 05/25/2018 Document Reviewed: 02/10/2016 Elsevier Patient Education  2020 Elsevier Inc.  

## 2019-12-01 NOTE — MAU Note (Signed)
@  1429 poc upt was positive.  Unable to transmit into epic.

## 2019-12-01 NOTE — MAU Provider Note (Signed)
  S:   29 y.o. [id number] @Unknown  presents to MAU for pregnancy confirmation.  She denies abdominal pain or vaginal bleeding today. Patient does not know when her last period was. Stopped taking OCPs sometime in August.   O: BP 115/77 (BP Location: Right Arm)   Pulse (!) 102   Temp 98.9 F (37.2 C) (Oral)   Resp 18   Wt 104.6 kg   LMP  (LMP Unknown)   SpO2 99% Comment: ra  BMI 38.36 kg/m  Physical Examination: General appearance - alert, well appearing, and in no distress, oriented to person, place, and time and acyanotic, in no respiratory distress  No results found for this or any previous visit (from the past 48 hour(s)).  A: 1. Positive pregnancy test      P: D/C home Instructed patient to call her ob in September to schedule an outpatient dating ultrasound Reviewed reasons to return to MAU  [place], NP 5:27 PM

## 2019-12-03 ENCOUNTER — Telehealth: Payer: Self-pay

## 2019-12-03 LAB — POCT PREGNANCY, URINE: Preg Test, Ur: POSITIVE — AB

## 2019-12-03 NOTE — Telephone Encounter (Signed)
Patient called in requesting to make an appointment for a dating ultrasound. I informed the patient that we would need her provider to place a order for an ultrasound as we can't schedule ultrasounds without an order placed. Patient became very hostile over the phone and started yelling. Patient stated that she had her pregnancy confirmed over at the hospital and that they didn't give her an estimated due date or anything, I asked the patient when her last period was and she stated that she did not know. Informed patient that I could send a message to her provider and see what we could do.

## 2019-12-04 NOTE — Addendum Note (Signed)
Addended by: Silvano Bilis on: 12/04/2019 04:59 PM   Modules accepted: Orders

## 2019-12-04 NOTE — Telephone Encounter (Signed)
Spoke to pt and got her schedule for an ultrasound and nurse intake.

## 2019-12-06 ENCOUNTER — Ambulatory Visit (INDEPENDENT_AMBULATORY_CARE_PROVIDER_SITE_OTHER): Payer: Medicaid Other

## 2019-12-06 ENCOUNTER — Other Ambulatory Visit: Payer: Self-pay

## 2019-12-06 ENCOUNTER — Other Ambulatory Visit: Payer: Medicaid Other

## 2019-12-06 VITALS — BP 112/81 | HR 73 | Ht 65.0 in | Wt 233.1 lb

## 2019-12-06 DIAGNOSIS — Z3481 Encounter for supervision of other normal pregnancy, first trimester: Secondary | ICD-10-CM

## 2019-12-06 NOTE — Progress Notes (Signed)
Brittany [name] presents for NOB nurse interview visit .LMP unknown. Pregnancy confirmation done 12/01/19 Dr Logan Bores.  G- 7.  P- 5 0 1 5. Pregnancy education material explained and given. 0 cats in the home. NOB labs ordered. TSH/HbgA1c due to Increased BMI, sickle cell. HIV labs and Drug screen were explained optional and she did not decline. PNV encouraged. Genetic screening options discussed. Genetic testing: will be ordered at U/S visit pending gestational age.  Pt may discuss with provider. Pt. To follow up with provider after U/S pending gestational age for NOB physical.  All questions answered.  Ultrasound rescheduled for 12/12/19.  FMLA paper explained and signed. Patient requested midwife for water birth.

## 2019-12-06 NOTE — Progress Notes (Signed)
I have reviewed the record and concur with patient management and plan of care.    Serafina Royals, CNM [facility], [facility] 12/06/19 10:54 AM

## 2019-12-07 LAB — URINALYSIS, ROUTINE W REFLEX MICROSCOPIC
Bilirubin, UA: NEGATIVE
Glucose, UA: NEGATIVE
Leukocytes,UA: NEGATIVE
Nitrite, UA: NEGATIVE
RBC, UA: NEGATIVE
Specific Gravity, UA: >=1.03 — AB (ref 1.005–1.030)
Urobilinogen, Ur: 1 mg/dL (ref 0.2–1.0)
pH, UA: 6 (ref 5.0–7.5)

## 2019-12-07 LAB — HEPATITIS B SURFACE ANTIGEN: Hepatitis B Surface Ag: NEGATIVE

## 2019-12-07 LAB — RPR: RPR Ser Ql: NONREACTIVE

## 2019-12-07 LAB — HGB SOLU + RFLX FRAC: Sickle Solubility Test - HGBRFX: NEGATIVE

## 2019-12-07 LAB — RUBELLA SCREEN: Rubella Antibodies, IGG: 1.44 index (ref >0.99)

## 2019-12-07 LAB — HEMOGLOBIN A1C
Est. average glucose Bld gHb Est-mCnc: 114 mg/dL
Hgb A1c MFr Bld: 5.6 % (ref 4.8–5.6)

## 2019-12-07 LAB — TSH: TSH: 1.47 u[IU]/mL (ref 0.450–4.500)

## 2019-12-07 LAB — ANTIBODY SCREEN: Antibody Screen: NEGATIVE

## 2019-12-07 LAB — HIV ANTIBODY (ROUTINE TESTING W REFLEX): HIV Screen 4th Generation wRfx: NONREACTIVE

## 2019-12-07 LAB — ABO AND RH: Rh Factor: POSITIVE

## 2019-12-07 LAB — VARICELLA ZOSTER ANTIBODY, IGG: Varicella zoster IgG: 1479 index (ref >165)

## 2019-12-08 LAB — GC/CHLAMYDIA PROBE AMP
Chlamydia trachomatis, NAA: NEGATIVE
Neisseria Gonorrhoeae by PCR: NEGATIVE

## 2019-12-08 LAB — URINE CULTURE

## 2019-12-10 ENCOUNTER — Telehealth: Payer: Self-pay

## 2019-12-10 DIAGNOSIS — Z3490 Encounter for supervision of normal pregnancy, unspecified, unspecified trimester: Secondary | ICD-10-CM

## 2019-12-10 NOTE — Telephone Encounter (Signed)
Called pt to move ultrasound appointment. The pt is requesting a call back about her Test results for her HCG. I told the pt I will send a message and to please allow 24- 48 hours for a reply. The pt verbally understood

## 2019-12-10 NOTE — Telephone Encounter (Signed)
Telephone call to patient, HIPPA approved message left of identified line asking patient to call back with further needs, questions, or concerns.    Brittany [name], CNM [facility], [facility] 12/10/19 5:11 PM

## 2019-12-12 ENCOUNTER — Other Ambulatory Visit: Payer: Medicaid Other

## 2019-12-14 ENCOUNTER — Other Ambulatory Visit: Payer: Self-pay

## 2019-12-14 ENCOUNTER — Ambulatory Visit (INDEPENDENT_AMBULATORY_CARE_PROVIDER_SITE_OTHER): Payer: Medicaid Other

## 2019-12-14 DIAGNOSIS — N926 Irregular menstruation, unspecified: Secondary | ICD-10-CM | POA: Diagnosis not present

## 2019-12-15 LAB — MONITOR DRUG PROFILE 14(MW)
Amphetamine Scrn, Ur: NEGATIVE ng/mL
BARBITURATE SCREEN URINE: NEGATIVE ng/mL
BENZODIAZEPINE SCREEN, URINE: NEGATIVE ng/mL
Buprenorphine, Urine: NEGATIVE ng/mL
Cocaine (Metab) Scrn, Ur: NEGATIVE ng/mL
Creatinine(Crt), U: 298.6 mg/dL (ref 20.0–300.0)
Fentanyl, Urine: NEGATIVE pg/mL
Meperidine Screen, Urine: NEGATIVE ng/mL
Methadone Screen, Urine: NEGATIVE ng/mL
OXYCODONE+OXYMORPHONE UR QL SCN: NEGATIVE ng/mL
Opiate Scrn, Ur: NEGATIVE ng/mL
Ph of Urine: 5.8 (ref 4.5–8.9)
Phencyclidine Qn, Ur: NEGATIVE ng/mL
Propoxyphene Scrn, Ur: NEGATIVE ng/mL
SPECIFIC GRAVITY: 1.029
Tramadol Screen, Urine: NEGATIVE ng/mL

## 2019-12-15 LAB — CANNABINOID (GC/MS), URINE
Cannabinoid: POSITIVE — AB
Carboxy THC (GC/MS): >750 ng/mL

## 2020-01-01 ENCOUNTER — Other Ambulatory Visit: Payer: Medicaid Other

## 2020-01-01 ENCOUNTER — Ambulatory Visit (INDEPENDENT_AMBULATORY_CARE_PROVIDER_SITE_OTHER): Payer: Medicaid Other

## 2020-01-01 ENCOUNTER — Other Ambulatory Visit: Payer: Self-pay

## 2020-01-01 DIAGNOSIS — N926 Irregular menstruation, unspecified: Secondary | ICD-10-CM | POA: Diagnosis not present

## 2020-01-02 ENCOUNTER — Emergency Department
Admission: EM | Admit: 2020-01-02 | Discharge: 2020-01-02 | Disposition: A | Discharge status: Home / Self Care | Payer: Medicaid Other | Attending: Emergency Medicine | Admitting: Emergency Medicine

## 2020-01-02 ENCOUNTER — Other Ambulatory Visit: Payer: Self-pay

## 2020-01-02 ENCOUNTER — Emergency Department: Payer: Medicaid Other

## 2020-01-02 DIAGNOSIS — Z87891 Personal history of nicotine dependence: Secondary | ICD-10-CM | POA: Diagnosis not present

## 2020-01-02 DIAGNOSIS — J45901 Unspecified asthma with (acute) exacerbation: Secondary | ICD-10-CM

## 2020-01-02 DIAGNOSIS — J189 Pneumonia, unspecified organism: Secondary | ICD-10-CM

## 2020-01-02 DIAGNOSIS — Z7951 Long term (current) use of inhaled steroids: Secondary | ICD-10-CM | POA: Diagnosis not present

## 2020-01-02 DIAGNOSIS — R0602 Shortness of breath: Secondary | ICD-10-CM | POA: Diagnosis present

## 2020-01-02 LAB — CBC WITH DIFFERENTIAL/PLATELET
Abs Immature Granulocytes: 0.03 10*3/uL (ref 0.00–0.07)
Basophils Absolute: 0 10*3/uL (ref 0.0–0.1)
Basophils Relative: 0 %
Eosinophils Absolute: 0.5 10*3/uL (ref 0.0–0.5)
Eosinophils Relative: 5 %
HCT: 34 % — ABNORMAL LOW (ref 36.0–46.0)
Hemoglobin: 11.7 g/dL — ABNORMAL LOW (ref 12.0–15.0)
Immature Granulocytes: 0 %
Lymphocytes Relative: 25 %
Lymphs Abs: 2.5 10*3/uL (ref 0.7–4.0)
MCH: 27 pg (ref 26.0–34.0)
MCHC: 34.4 g/dL (ref 30.0–36.0)
MCV: 78.3 fL — ABNORMAL LOW (ref 80.0–100.0)
Monocytes Absolute: 0.6 10*3/uL (ref 0.1–1.0)
Monocytes Relative: 6 %
Neutro Abs: 6.5 10*3/uL (ref 1.7–7.7)
Neutrophils Relative %: 64 %
Platelets: 285 10*3/uL (ref 150–400)
RBC: 4.34 MIL/uL (ref 3.87–5.11)
RDW: 15 % (ref 11.5–15.5)
WBC: 10.1 10*3/uL (ref 4.0–10.5)
nRBC: 0 % (ref 0.0–0.2)

## 2020-01-02 LAB — BASIC METABOLIC PANEL
Anion gap: 11 (ref 5–15)
BUN: 7 mg/dL (ref 6–20)
CO2: 23 mmol/L (ref 22–32)
Calcium: 9 mg/dL (ref 8.9–10.3)
Chloride: 104 mmol/L (ref 98–111)
Creatinine, Ser: 0.73 mg/dL (ref 0.44–1.00)
GFR, Estimated: >60 mL/min (ref >60)
Glucose, Bld: 145 mg/dL — ABNORMAL HIGH (ref 70–99)
Potassium: 3.4 mmol/L — ABNORMAL LOW (ref 3.5–5.1)
Sodium: 138 mmol/L (ref 135–145)

## 2020-01-02 LAB — MAGNESIUM: Magnesium: 2 mg/dL (ref 1.7–2.4)

## 2020-01-02 IMAGING — CR DG CHEST 2V
1 series · 2 of 2 positions shown · non-contrast
Comparison: [DATE]

CLINICAL DATA: Shortness of breath

EXAM:
CHEST - 2 VIEW

[Series 1: dg chest 2 view · 0.14mm/px · 2 of 2 slices shown]
[im 1/2]
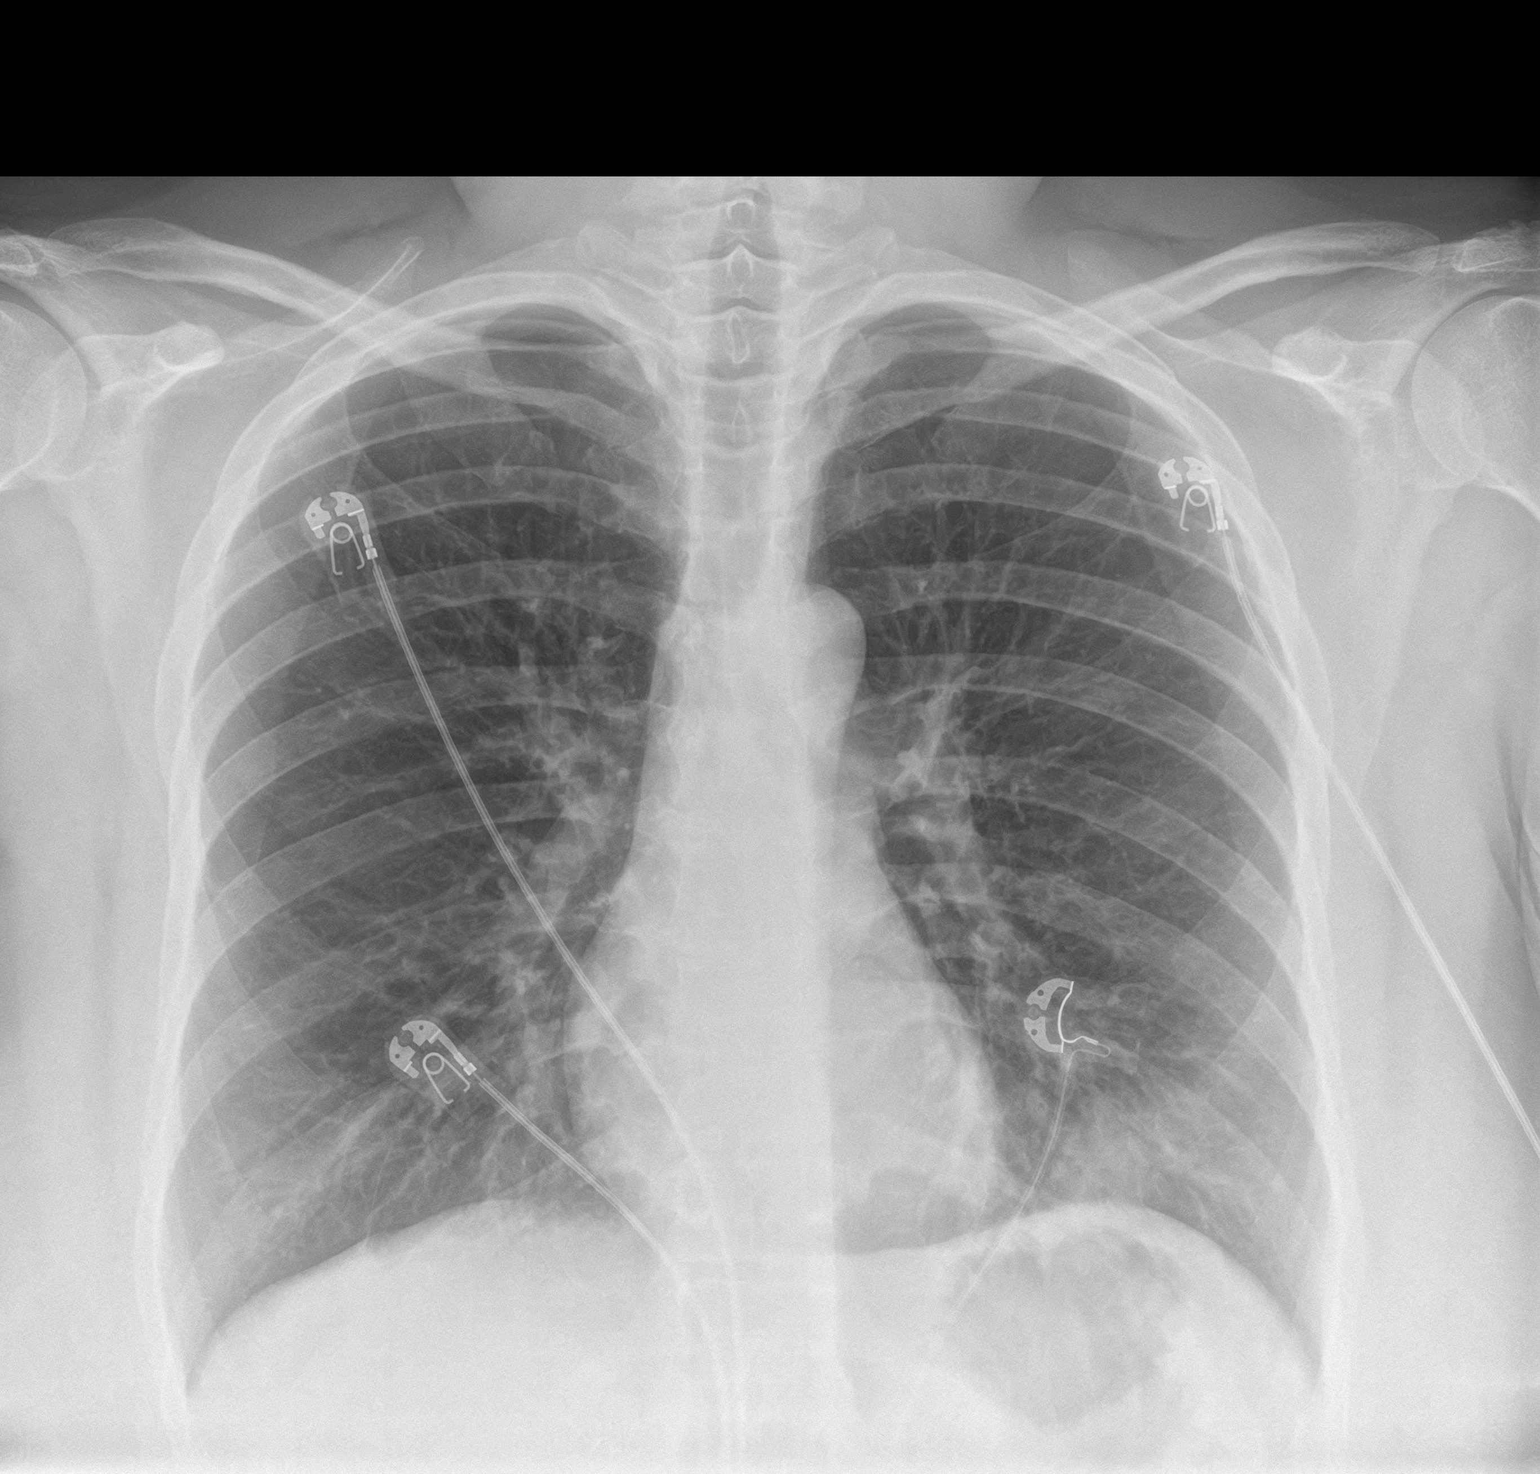
[im 2/2]
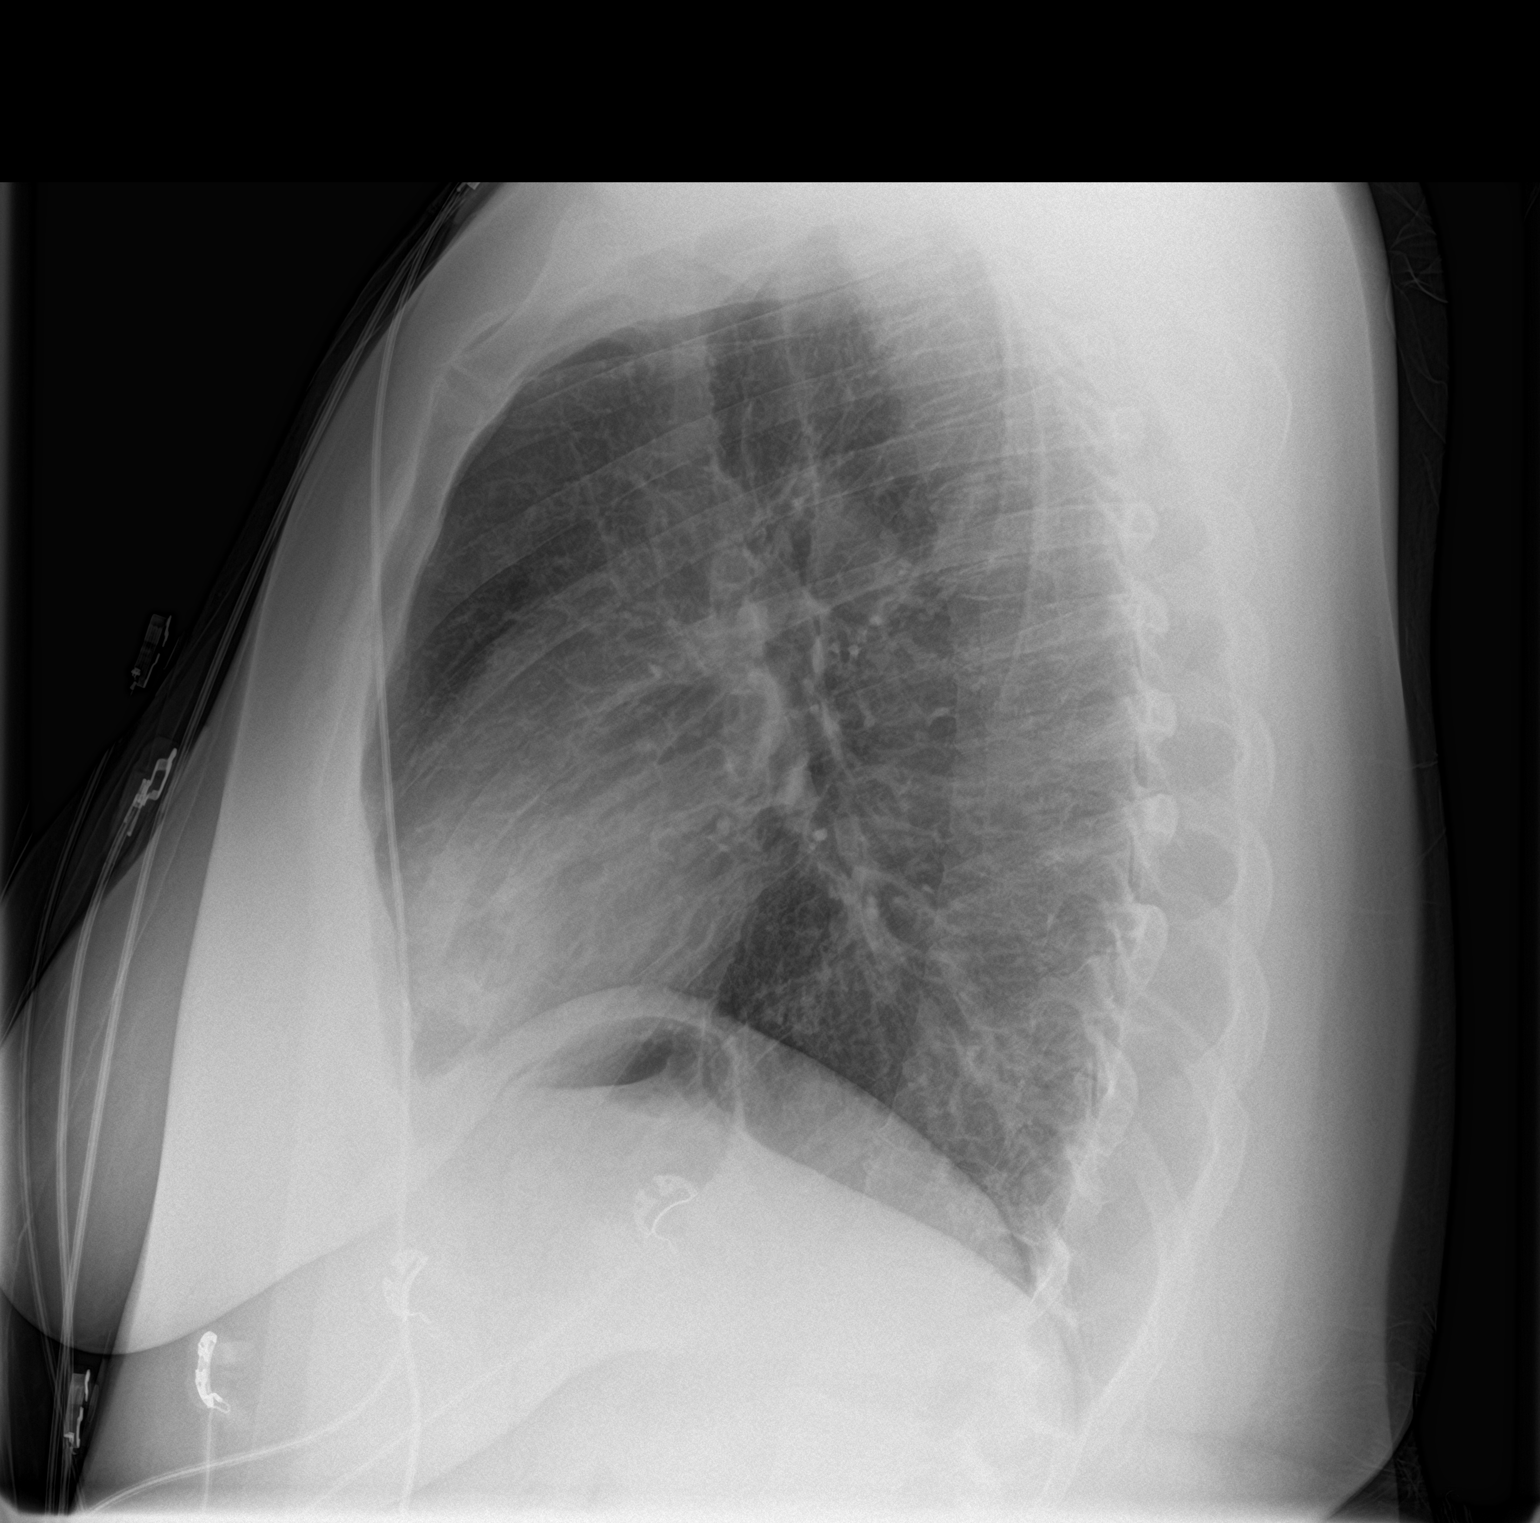

[2 of 2 positions shown; findings below may reference images not displayed]

FINDINGS: Lingular airspace disease. Probable airway cuffing at the hila. No
edema, effusion, or pneumothorax. Normal heart size.
IMPRESSION: Lingular pneumonia.

Probable bilateral airway thickening.

## 2020-01-02 MED ORDER — SODIUM CHLORIDE 0.9 % IV SOLN
500.0000 mg | Freq: Once | INTRAVENOUS | Status: AC
  Administered 2020-01-02: 500 mg via INTRAVENOUS
  Filled 2020-01-02: qty 500

## 2020-01-02 MED ORDER — ALBUTEROL SULFATE (2.5 MG/3ML) 0.083% IN NEBU
2.5000 mg | INHALATION_SOLUTION | Freq: Once | RESPIRATORY_TRACT | Status: AC
  Administered 2020-01-02: 2.5 mg via RESPIRATORY_TRACT
  Filled 2020-01-02: qty 3

## 2020-01-02 MED ORDER — SODIUM CHLORIDE 0.9 % IV BOLUS
500.0000 mL | Freq: Once | INTRAVENOUS | Status: AC
  Administered 2020-01-02: 500 mL via INTRAVENOUS

## 2020-01-02 MED ORDER — ALBUTEROL SULFATE HFA 108 (90 BASE) MCG/ACT IN AERS: 2.0000 | INHALATION_SPRAY | RESPIRATORY_TRACT | 1 refills | Status: DC | PRN

## 2020-01-02 MED ORDER — PREDNISONE 20 MG PO TABS: 60.0000 mg | ORAL_TABLET | Freq: Every day | ORAL | 0 refills | Status: AC

## 2020-01-02 MED ORDER — SODIUM CHLORIDE 0.9 % IV SOLN
1.0000 g | Freq: Once | INTRAVENOUS | Status: AC
  Administered 2020-01-02: 1 g via INTRAVENOUS
  Filled 2020-01-02: qty 10

## 2020-01-02 MED ORDER — FLUTICASONE PROPIONATE HFA 110 MCG/ACT IN AERO: 1.0000 | INHALATION_SPRAY | Freq: Two times a day (BID) | RESPIRATORY_TRACT | 1 refills | Status: DC

## 2020-01-02 MED ORDER — AZITHROMYCIN 250 MG PO TABS: 250.0000 mg | ORAL_TABLET | Freq: Every day | ORAL | 0 refills | Status: AC

## 2020-01-02 MED ORDER — ALBUTEROL SULFATE (2.5 MG/3ML) 0.083% IN NEBU: 2.5000 mg | INHALATION_SOLUTION | RESPIRATORY_TRACT | 1 refills | Status: DC | PRN

## 2020-01-02 NOTE — Discharge Instructions (Signed)
Take the antibiotic and steroid as prescribed.  You can use the albuterol up to every 4 hours either via inhaler or nebulizer.  Return to the ER for new, worsening, or persistent severe shortness of breath, fever, weakness, or any other new or worsening symptoms that concern you.

## 2020-01-02 NOTE — ED Provider Notes (Signed)
[facility] Emergency Department Provider Note ____________________________________________   First MD Initiated Contact with Patient 01/02/20 [phone]     (approximate)  I have reviewed the triage vital signs and the nursing notes.   HISTORY  Chief Complaint Shortness of Breath    HPI Brittany [name] is a 29 y.o. female with PMH as noted below including a history of asthma who presents with shortness of breath over the last several days, associated with chest tightness, and feeling similar to her prior asthma exacerbations.  The patient states that she works partially outdoors and the recent cold temperatures have exacerbated her asthma.  She states that she used her inhaler and home breathing treatment multiple times throughout the night without relief.  She states she previously was on Flovent, but this was stopped when she was pregnant.  She is pregnant again and recently had an ultrasound showing a 6-week gestation.  Past Medical History:  Diagnosis Date  . Asthma   . Chlamydia   . Gonorrhea   . Hx MRSA infection 2005  . NVD (normal vaginal delivery) 08/30/2010  . Trichimoniasis     Patient Active Problem List   Diagnosis Date Noted  . PROM (premature rupture of membranes) 05/18/2019  . History of postpartum hemorrhage 05/18/2019  . Postpartum hemorrhage 05/18/2019  . Irregular uterine contractions 05/13/2019  . Supervision of normal pregnancy 05/13/2019  . Anemia of pregnancy in third trimester 03/20/2019  . History of gestational diabetes in prior pregnancy, currently pregnant in second trimester 01/31/2019  . Carpal tunnel syndrome during pregnancy 01/31/2019  . Insufficient prenatal care in second trimester 01/31/2019  . Peritonsillar abscess 02/25/2018  . Asthma 02/25/2018  . Status post normal vaginal delivery 09/05/2013  . Other specified indication for care or intervention related to labor and delivery, unspecified as to episode of  care 09/03/2013  . Pregnant 12/01/2011  . Elevated blood pressure complicating pregnancy, antepartum 12/01/2011    Past Surgical History:  Procedure Laterality Date  . MULTIPLE TOOTH EXTRACTIONS      Prior to Admission medications   Medication Sig Start Date End Date Taking? Authorizing Provider  albuterol (PROVENTIL HFA;VENTOLIN HFA) 108 (90 Base) MCG/ACT inhaler Inhale 1-2 puffs into the lungs every 6 (six) hours as needed for wheezing or shortness of breath. Patient not taking: Reported on 12/06/2019    [provider]  albuterol (PROVENTIL) (2.5 MG/3ML) 0.083% nebulizer solution Take 3 mLs (2.5 mg total) by nebulization every 4 (four) hours as needed for wheezing or shortness of breath. 01/02/20   Dionne Bucy, MD  albuterol (VENTOLIN HFA) 108 (90 Base) MCG/ACT inhaler Inhale 2 puffs into the lungs every 6 (six) hours as needed for wheezing or shortness of breath. Patient not taking: Reported on 12/06/2019 12/04/18   Linzie Collin, MD  albuterol (VENTOLIN HFA) 108 (90 Base) MCG/ACT inhaler Inhale 2 puffs into the lungs every 4 (four) hours as needed for wheezing or shortness of breath. 01/02/20   Dionne Bucy, MD  azithromycin (ZITHROMAX) 250 MG tablet Take 1 tablet (250 mg total) by mouth daily for 4 days. Start the day after the ER visit 01/03/20 01/07/20  Dionne Bucy, MD  docusate sodium (COLACE) 100 MG capsule Take 1 capsule (100 mg total) by mouth 2 (two) times daily as needed. 05/19/19   Hildred Laser, MD  ferrous sulfate (FERROUSUL) 325 (65 FE) MG tablet Take 1 tablet (325 mg total) by mouth 2 (two) times daily. 05/19/19   Hildred Laser, MD  fluticasone (FLOVENT HFA) 110 MCG/ACT inhaler Inhale 1 puff into the lungs 2 (two) times daily. 01/02/20 03/02/20  Dionne Bucy, MD  predniSONE (DELTASONE) 20 MG tablet Take 3 tablets (60 mg total) by mouth daily for 4 days. Start the day after the ER visit 01/02/20 01/06/20  Dionne Bucy, MD   Prenatal Multivit-Min-Fe-FA (PRE-NATAL PO) Take by mouth.    [provider]    Allergies Iodine and Sulfonamide derivatives  Family History  Problem Relation Age of Onset  . Diabetes Maternal Grandmother   . Hypertension Father   . Heart disease Father   . Lung disease Father   . Healthy Mother   . Anesthesia problems Neg Hx   . Other Neg Hx     Social History Social History   Tobacco Use  . Smoking status: Former Smoker    Packs/day: 0.25    Years: 5.00    Pack years: 1.25    Types: Cigarettes    Quit date: 12/27/2012    Years since quitting: 7.0  . Smokeless tobacco: Never Used  Vaping Use  . Vaping Use: Never used  Substance Use Topics  . Alcohol use: No  . Drug use: Yes    Types: Marijuana    Review of Systems  Constitutional: No fever. Eyes: No redness. ENT: No sore throat. Cardiovascular: Positive for chest pain. Respiratory: Positive for shortness of breath. Gastrointestinal: No vomiting or diarrhea.  Genitourinary: Negative for dysuria.  Musculoskeletal: Negative for back pain. Skin: Negative for rash. Neurological: Negative for headache.   ____________________________________________   PHYSICAL EXAM:  VITAL SIGNS: ED Triage Vitals  Enc Vitals Group     BP 01/02/20 0821 (!) 149/95     Pulse Rate 01/02/20 0821 (!) 130     Resp 01/02/20 0821 (!) 24     Temp 01/02/20 0821 (!) 97.5 F (36.4 C)     Temp Source 01/02/20 0821 Axillary     SpO2 01/02/20 0821 98 %     Weight 01/02/20 0827 230 lb (104.3 kg)     Height 01/02/20 0827 5\' 5"  (1.651 m)     Head Circumference --      Peak Flow --      Pain Score 01/02/20 0827 8     Pain Loc --      Pain Edu? --      Excl. in GC? --     Constitutional: Alert and oriented.  Relatively well appearing and in no acute distress. Eyes: Conjunctivae are normal.  Head: Atraumatic. Nose: No congestion/rhinnorhea. Mouth/Throat: Mucous membranes are moist.   Neck: Normal range of motion.   Cardiovascular: Normal rate, regular rhythm. Grossly normal heart sounds.  Good peripheral circulation. Respiratory: Slightly increased respiratory effort.  Diffuse wheezing bilaterally with good air entry. Gastrointestinal: No distention.  Musculoskeletal: No lower extremity edema.  Extremities warm and well perfused.  Neurologic:  Normal speech and language. No gross focal neurologic deficits are appreciated.  Skin:  Skin is warm and dry. No rash noted. Psychiatric: Mood and affect are normal. Speech and behavior are normal.  ____________________________________________   LABS (all labs ordered are listed, but only abnormal results are displayed)  Labs Reviewed  BASIC METABOLIC PANEL - Abnormal; Notable for the following components:      Result Value   Potassium 3.4 (*)    Glucose, Bld 145 (*)    All other components within normal limits  CBC WITH DIFFERENTIAL/PLATELET - Abnormal; Notable for the following components:   Hemoglobin 11.7 (*)  HCT 34.0 (*)    MCV 78.3 (*)    All other components within normal limits  MAGNESIUM   ____________________________________________  EKG  ED ECG REPORT I, Dionne Bucy, the attending physician, personally viewed and interpreted this ECG.  Date: 01/02/2020 EKG Time: 0826 Rate: 145 Rhythm: Sinus tachycardia QRS Axis: normal Intervals: normal ST/T Wave abnormalities: normal Narrative Interpretation: no evidence of acute ischemia  ____________________________________________  RADIOLOGY  CXR interpreted by me shows lingular opacity consistent with pneumonia  ____________________________________________   PROCEDURES  Procedure(s) performed: No  Procedures  Critical Care performed: No ____________________________________________   INITIAL IMPRESSION / ASSESSMENT AND PLAN / ED COURSE  Pertinent labs & imaging results that were available during my care of the patient were reviewed by me and considered in my medical  decision making (see chart for details).  29 year old female with PMH as noted above including a history of asthma presents with gradual onset worsening shortness of breath over the last several days associated with some chest tightness.  She used her inhaler and nebulizer overnight without relief.  She was given further bronchodilators and Solu-Medrol by EMS.  On exam, the patient is overall well-appearing.  O2 saturation is in the high 90s on room air.  She is slightly tachycardic and has somewhat increased work of breathing, but afebrile.  There is diffuse wheezing bilaterally but good air entry.  Exam is otherwise unremarkable.  Overall presentation is consistent with an asthma exacerbation.  The patient has already received bronchodilators and steroids, and we will observe over the next few hours to see how she does.  At this time there is no indication for magnesium.  We will obtain a chest x-ray and basic labs, give a fluid bolus, and reassess.  ----------------------------------------- 1:42 PM on 01/02/2020 -----------------------------------------  The patient continued to have moderate wheezing after approximately 2 hours, so I gave additional albuterol.  Chest x-ray shows possible lingular pneumonia.  On reassessment now, the patient appears much more comfortable.  She is breathing without difficulty.  O2 saturation is 99% on room air.  Her other vital signs remained stable.  She has borderline tachycardia although I suspect that this is mostly due to the albuterol.  She is tolerating p.o.  Lab work-up is unremarkable.  I gave a dose of ceftriaxone and azithromycin in the ED.  At this time, the patient feels comfortable going home.  I counseled her on the results of the work-up.  I will prescribe azithromycin, prednisone, Flovent for maintenance, and additional albuterol.  For all of these medications, given her relatively severe asthma, the benefits for her outweigh minimal pregnancy  risks.  The patient is in agreement.  Return precautions given, and she expresses understanding.  ____________________________________________   FINAL CLINICAL IMPRESSION(S) / ED DIAGNOSES  Final diagnoses:  Community acquired pneumonia of left lung, unspecified part of lung  Exacerbation of asthma, unspecified asthma severity, unspecified whether persistent      NEW MEDICATIONS STARTED DURING THIS VISIT:  New Prescriptions   ALBUTEROL (PROVENTIL) (2.5 MG/3ML) 0.083% NEBULIZER SOLUTION    Take 3 mLs (2.5 mg total) by nebulization every 4 (four) hours as needed for wheezing or shortness of breath.   ALBUTEROL (VENTOLIN HFA) 108 (90 BASE) MCG/ACT INHALER    Inhale 2 puffs into the lungs every 4 (four) hours as needed for wheezing or shortness of breath.   AZITHROMYCIN (ZITHROMAX) 250 MG TABLET    Take 1 tablet (250 mg total) by mouth daily for 4 days. Start the  day after the ER visit   FLUTICASONE (FLOVENT HFA) 110 MCG/ACT INHALER    Inhale 1 puff into the lungs 2 (two) times daily.   PREDNISONE (DELTASONE) 20 MG TABLET    Take 3 tablets (60 mg total) by mouth daily for 4 days. Start the day after the ER visit     Note:  This document was prepared using Dragon voice recognition software and may include unintentional dictation errors.    Dionne BucySiadecki, Kripa Foskey, MD 01/02/20 1348

## 2020-01-02 NOTE — ED Notes (Signed)
D/C and new RX discussed with pt. Pt verbalized understanding. No distress noted. VSS.

## 2020-01-02 NOTE — ED Notes (Signed)
Pt at XRAY

## 2020-01-02 NOTE — ED Notes (Signed)
Pt presents to ED from home via EMS with c/o of SOB that has been increasing for the past week or so. Pt states chest pain started on 11/14 that pt states "it hurts when I move my arm". Pt states she can normally take breathing treatments and rescue inhaler at home and states it will normally relieve the SOB but states it did not this time. Pt states taking 3-4 breathing treatments at home prior to EMS arrival. EMS states giving 2 duonebs and 1 albuterol x PTA and also 125mg  of IVP solumedrol. Pt tachycardiac on arrival. Pt does appear to have increased work of breathing and SOB when speaking. Accessory muscle use noted. Pt states currently pregnancy and is unsure of how far along. Pt has a RA sat of 96-98%. Pt has a tight non productive cough and exp wheezing throughout all lobes. Pt is A&Ox4.

## 2020-01-02 NOTE — ED Notes (Signed)
Pt showing improvement at this time. Pt is able to speak in full sentences without becoming as dyspneic than on arrival. Pt is also eating at this time with no difficulty. Pt also endorses feeling better. Pt on RA.

## 2020-01-02 NOTE — ED Triage Notes (Signed)
Pt arrived via EMS with c/o of SOB, HX asthma.

## 2020-01-02 NOTE — ED Notes (Signed)
Pt resting quietly and comfortably at this time. Pt's work of breathing has seemed to improve and pt states she still feels tightness in chest but states overall breathing has improved. No distress noted. Pt on RA at this time. VSS. Call bell in reach. Warm blanket provided.

## 2020-01-08 ENCOUNTER — Emergency Department
Admission: EM | Admit: 2020-01-08 | Discharge: 2020-01-08 | Disposition: A | Discharge status: Home / Self Care | Payer: Medicaid Other | Attending: Emergency Medicine | Admitting: Emergency Medicine

## 2020-01-08 ENCOUNTER — Other Ambulatory Visit: Payer: Self-pay

## 2020-01-08 ENCOUNTER — Emergency Department: Payer: Medicaid Other

## 2020-01-08 DIAGNOSIS — Z87891 Personal history of nicotine dependence: Secondary | ICD-10-CM | POA: Diagnosis not present

## 2020-01-08 DIAGNOSIS — O039 Complete or unspecified spontaneous abortion without complication: Secondary | ICD-10-CM

## 2020-01-08 DIAGNOSIS — Z3A09 9 weeks gestation of pregnancy: Secondary | ICD-10-CM | POA: Insufficient documentation

## 2020-01-08 DIAGNOSIS — Z7951 Long term (current) use of inhaled steroids: Secondary | ICD-10-CM | POA: Diagnosis not present

## 2020-01-08 DIAGNOSIS — J45909 Unspecified asthma, uncomplicated: Secondary | ICD-10-CM | POA: Diagnosis not present

## 2020-01-08 DIAGNOSIS — O99511 Diseases of the respiratory system complicating pregnancy, first trimester: Secondary | ICD-10-CM | POA: Insufficient documentation

## 2020-01-08 DIAGNOSIS — O209 Hemorrhage in early pregnancy, unspecified: Secondary | ICD-10-CM | POA: Diagnosis present

## 2020-01-08 DIAGNOSIS — O4690 Antepartum hemorrhage, unspecified, unspecified trimester: Secondary | ICD-10-CM

## 2020-01-08 LAB — CBC WITH DIFFERENTIAL/PLATELET
Abs Immature Granulocytes: 0.03 10*3/uL (ref 0.00–0.07)
Basophils Absolute: 0 10*3/uL (ref 0.0–0.1)
Basophils Relative: 0 %
Eosinophils Absolute: 0.5 10*3/uL (ref 0.0–0.5)
Eosinophils Relative: 5 %
HCT: 31.9 % — ABNORMAL LOW (ref 36.0–46.0)
Hemoglobin: 11 g/dL — ABNORMAL LOW (ref 12.0–15.0)
Immature Granulocytes: 0 %
Lymphocytes Relative: 43 %
Lymphs Abs: 4.9 10*3/uL — ABNORMAL HIGH (ref 0.7–4.0)
MCH: 27 pg (ref 26.0–34.0)
MCHC: 34.5 g/dL (ref 30.0–36.0)
MCV: 78.2 fL — ABNORMAL LOW (ref 80.0–100.0)
Monocytes Absolute: 0.7 10*3/uL (ref 0.1–1.0)
Monocytes Relative: 6 %
Neutro Abs: 5 10*3/uL (ref 1.7–7.7)
Neutrophils Relative %: 46 %
Platelets: 315 10*3/uL (ref 150–400)
RBC: 4.08 MIL/uL (ref 3.87–5.11)
RDW: 14.8 % (ref 11.5–15.5)
WBC: 11.2 10*3/uL — ABNORMAL HIGH (ref 4.0–10.5)
nRBC: 0 % (ref 0.0–0.2)

## 2020-01-08 LAB — WET PREP, GENITAL
Clue Cells Wet Prep HPF POC: NONE SEEN
Sperm: NONE SEEN
Trich, Wet Prep: NONE SEEN
Yeast Wet Prep HPF POC: NONE SEEN

## 2020-01-08 LAB — URINALYSIS, COMPLETE (UACMP) WITH MICROSCOPIC
Bacteria, UA: NONE SEEN
Bilirubin Urine: NEGATIVE
Glucose, UA: NEGATIVE mg/dL
Ketones, ur: NEGATIVE mg/dL
Nitrite: NEGATIVE
Protein, ur: 30 mg/dL — AB
RBC / HPF: >50 RBC/hpf — ABNORMAL HIGH (ref 0–5)
Specific Gravity, Urine: 1.028 (ref 1.005–1.030)
pH: 5 (ref 5.0–8.0)

## 2020-01-08 LAB — BASIC METABOLIC PANEL
Anion gap: 6 (ref 5–15)
BUN: 12 mg/dL (ref 6–20)
CO2: 26 mmol/L (ref 22–32)
Calcium: 8.6 mg/dL — ABNORMAL LOW (ref 8.9–10.3)
Chloride: 105 mmol/L (ref 98–111)
Creatinine, Ser: 0.81 mg/dL (ref 0.44–1.00)
GFR, Estimated: >60 mL/min (ref >60)
Glucose, Bld: 101 mg/dL — ABNORMAL HIGH (ref 70–99)
Potassium: 3.5 mmol/L (ref 3.5–5.1)
Sodium: 137 mmol/L (ref 135–145)

## 2020-01-08 LAB — POC URINE PREG, ED: Preg Test, Ur: POSITIVE — AB

## 2020-01-08 LAB — HCG, QUANTITATIVE, PREGNANCY: hCG, Beta Chain, Quant, S: 9546 m[IU]/mL — ABNORMAL HIGH (ref <5)

## 2020-01-08 LAB — CHLAMYDIA/NGC RT PCR (ARMC ONLY)
Chlamydia Tr: NOT DETECTED
N gonorrhoeae: NOT DETECTED

## 2020-01-08 LAB — ABO/RH: ABO/RH(D): O POS

## 2020-01-08 IMAGING — US US OB COMP LESS 14 WK
1 series · 14 of 28 positions shown · non-contrast
Comparison: [DATE] outside study

CLINICAL DATA: Vaginal bleeding, abdominal cramping

EXAM:
OBSTETRIC <14 WK US AND TRANSVAGINAL OB US
TECHNIQUE: Both transabdominal and transvaginal ultrasound examinations were
performed for complete evaluation of the gestation as well as the
maternal uterus, adnexal regions, and pelvic cul-de-sac.
Transvaginal technique was performed to assess early pregnancy.

[Series 1: us ob comp less 14 wks · 57 acquisitions, 14 frames shown]
[im 3/57]
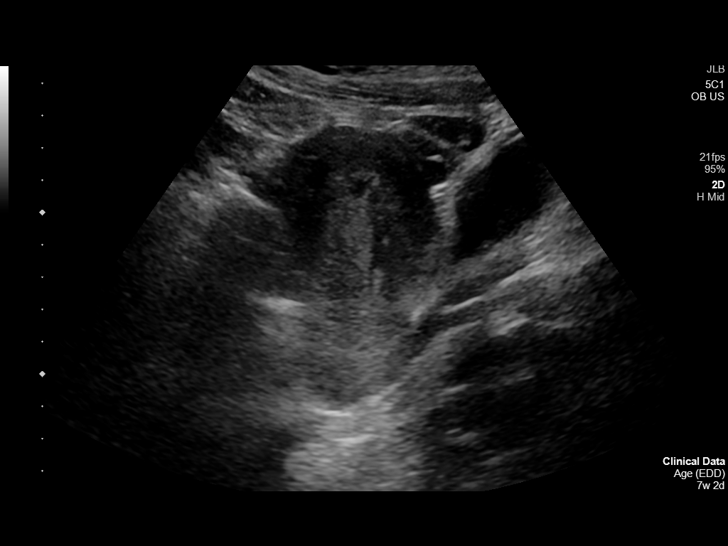
[im 7/57]
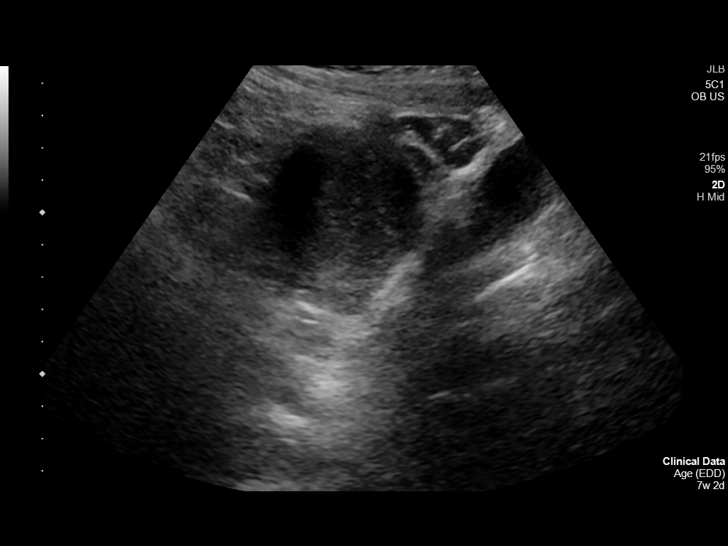
[im 11/57]
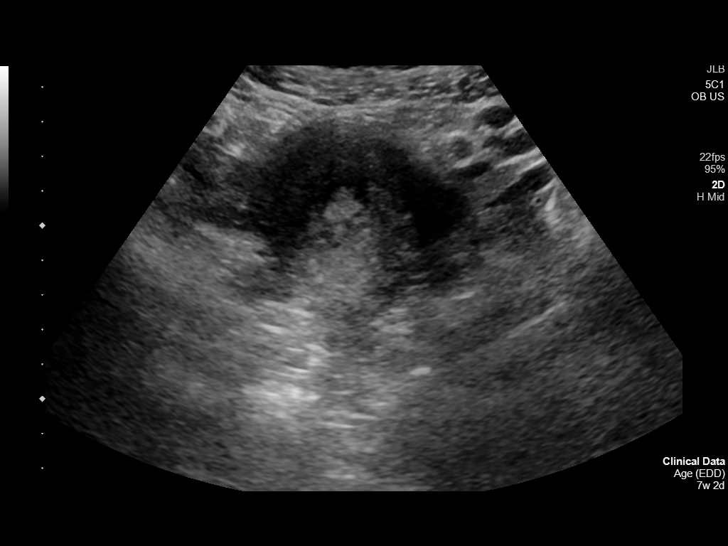
[im 15/57]
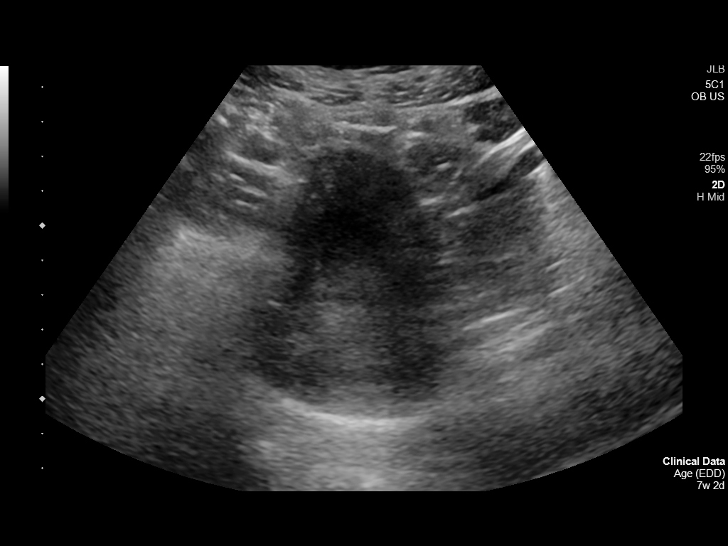
[im 19/57]
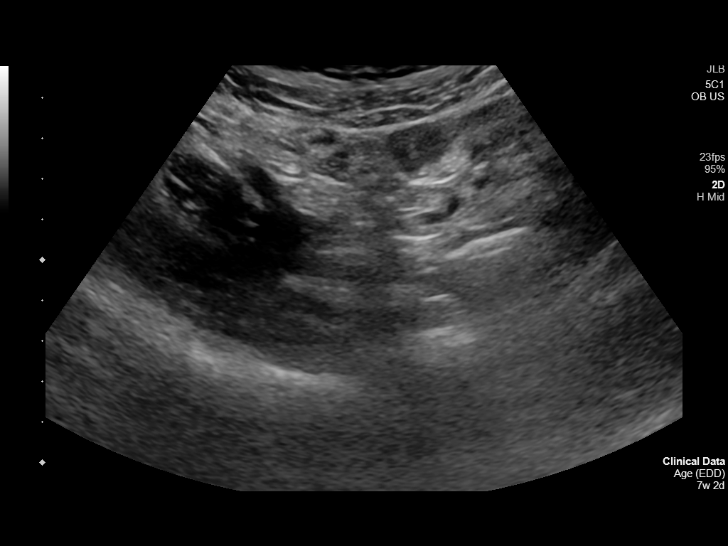
[im 23/57]
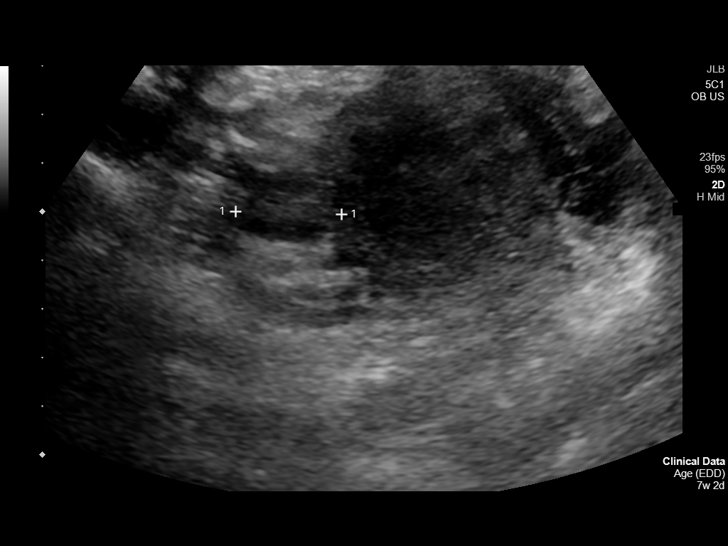
[im 27/57]
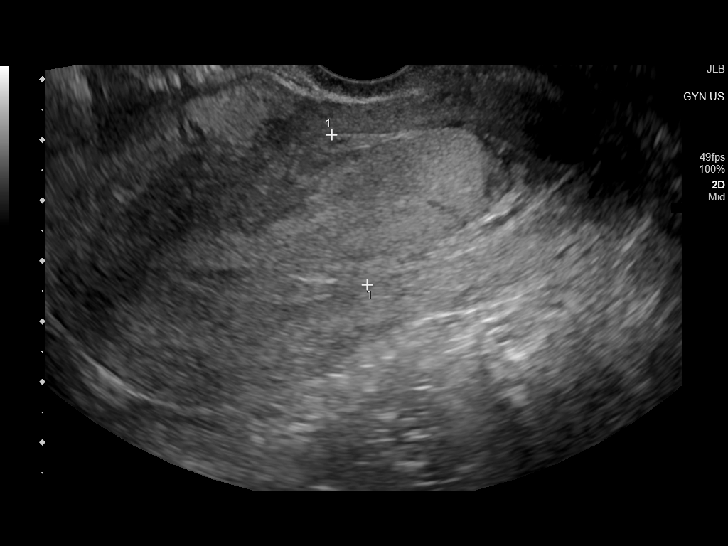
[im 32/57]
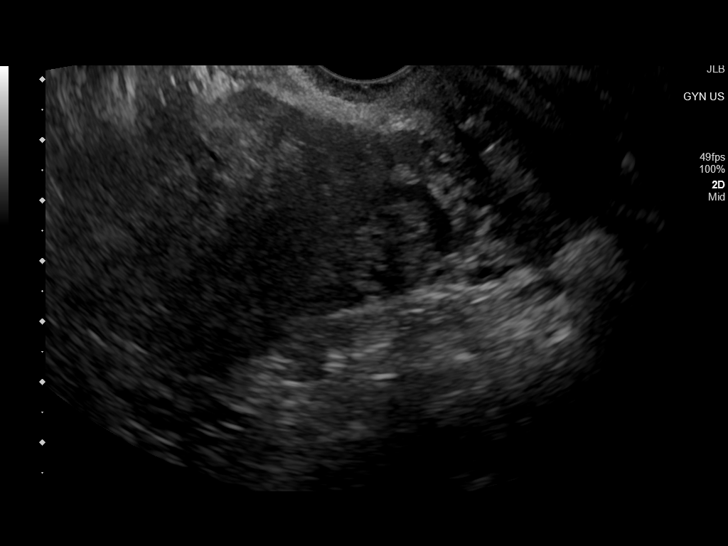
[im 36/57]
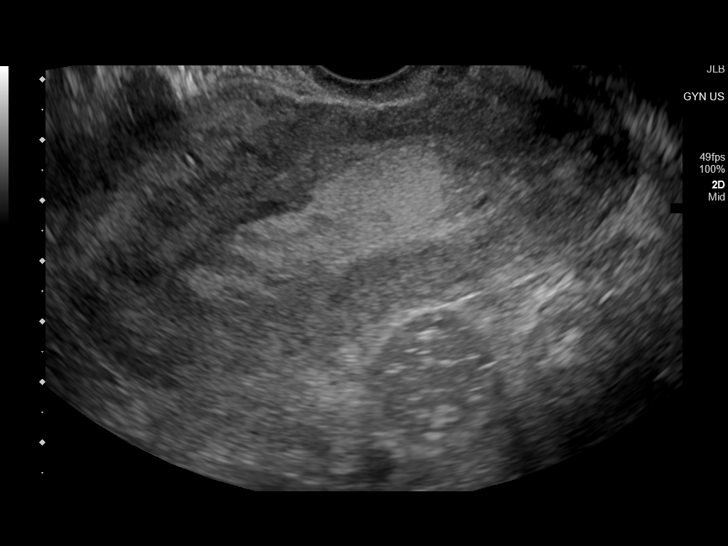
[im 40/57]
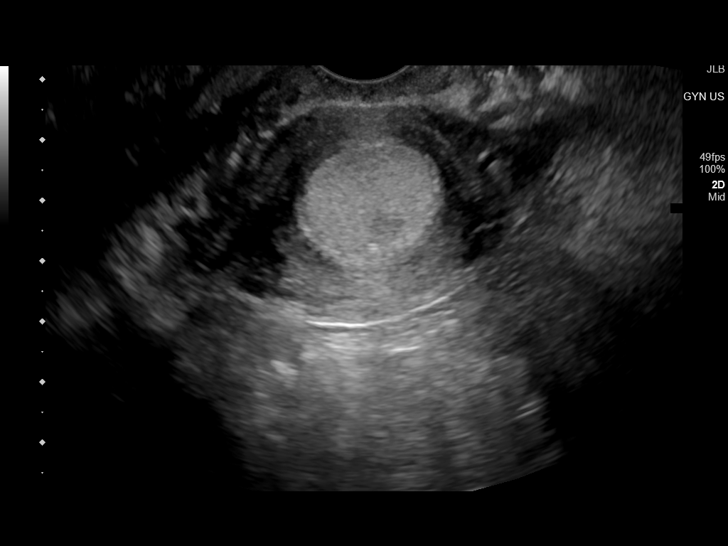
[im 44/57]
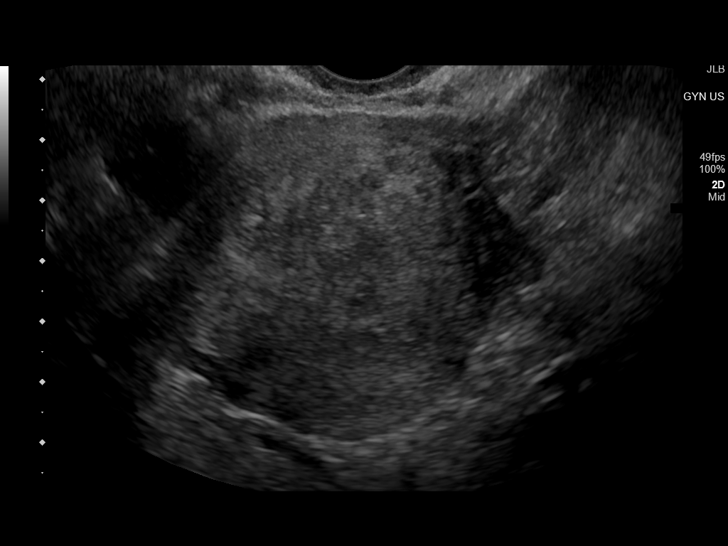
[im 48/57]
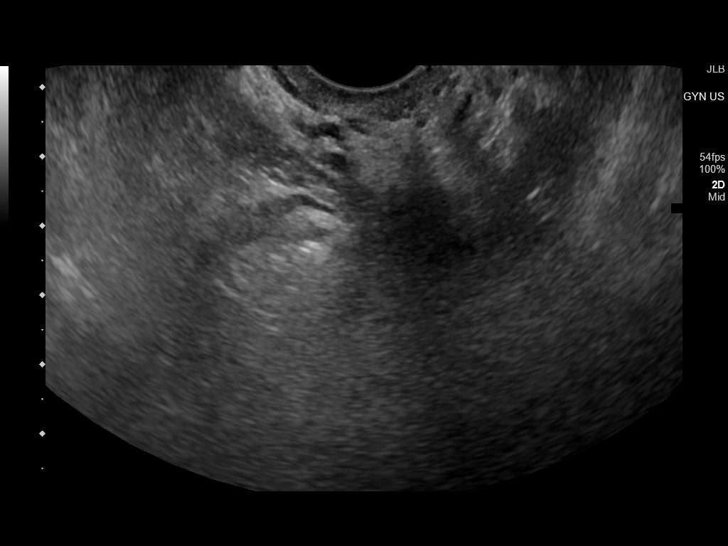
[im 52/57]
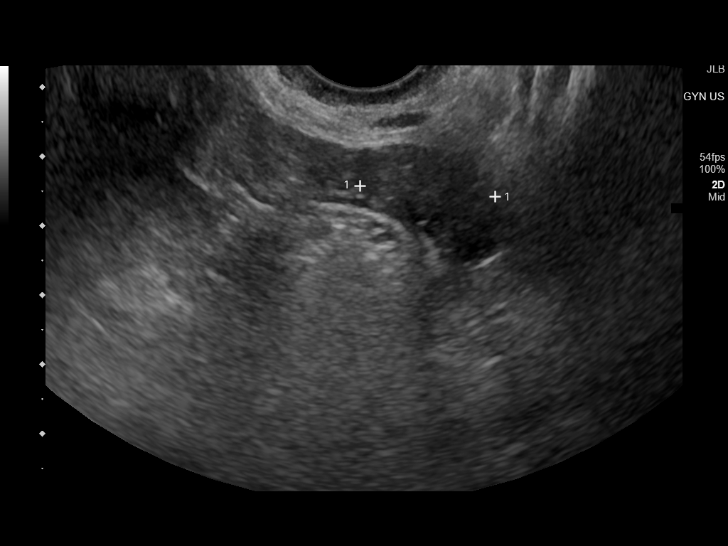
[im 57/57]
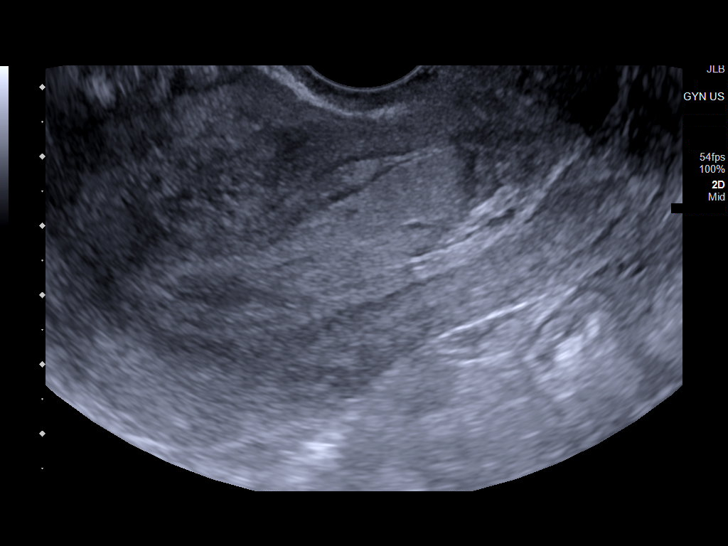

[14 of 28 positions shown; findings below may reference images not displayed]

FINDINGS: Intrauterine gestational sac: None

Yolk sac:  Not Visualized.

Embryo:  Not Visualized.

Maternal uterus/adnexae: No free fluid. There is a thickened,
heterogeneous endometrium, which may reflect hemorrhage and
nonviable pregnancy. Ovaries are unremarkable.
IMPRESSION: Viable intrauterine pregnancy is no longer identified. There is a
thickened, heterogeneous endometrium. Together, findings likely
reflect spontaneous abortion in progress.

## 2020-01-08 MED ORDER — ACETAMINOPHEN 500 MG PO TABS
1000.0000 mg | ORAL_TABLET | Freq: Once | ORAL | Status: AC
  Administered 2020-01-08: 1000 mg via ORAL
  Filled 2020-01-08: qty 2

## 2020-01-08 NOTE — ED Triage Notes (Signed)
See first nurse note. Pt reports vaginal bleeding that started approx 330 today, 9 weeks pregnancy.  States has used 2 pads since 330.  C/o lower abdominal pain below belly button since this afternoon.  Hx of 1 miscarriage

## 2020-01-08 NOTE — ED Provider Notes (Signed)
[facility] Emergency Department Provider Note   ____________________________________________   First MD Initiated Contact with Patient 01/08/20 2107     (approximate)  I have reviewed the triage vital signs and the nursing notes.   HISTORY  Chief Complaint Vaginal Bleeding    HPI Brittany [name] is a 29 y.o. female, G7 P5-0-1-5 at approximately 9 weeks of pregnancy who presents to the ED complaining of vaginal bleeding.  Patient states she had onset of crampy pelvic pain along with vaginal bleeding around 330 this afternoon.  She describes the pain as constant and similar to prior miscarriages.  She has not noticed the passage of any tissue, has had to change her pad once at home since onset of bleeding.  She denies any lightheadedness, chest pain, or shortness of breath.  She has not had any dysuria or hematuria.        Past Medical History:  Diagnosis Date  . Asthma   . Chlamydia   . Gonorrhea   . Hx MRSA infection 2005  . NVD (normal vaginal delivery) 08/30/2010  . Trichimoniasis     Patient Active Problem List   Diagnosis Date Noted  . PROM (premature rupture of membranes) 05/18/2019  . History of postpartum hemorrhage 05/18/2019  . Postpartum hemorrhage 05/18/2019  . Irregular uterine contractions 05/13/2019  . Supervision of normal pregnancy 05/13/2019  . Anemia of pregnancy in third trimester 03/20/2019  . History of gestational diabetes in prior pregnancy, currently pregnant in second trimester 01/31/2019  . Carpal tunnel syndrome during pregnancy 01/31/2019  . Insufficient prenatal care in second trimester 01/31/2019  . Peritonsillar abscess 02/25/2018  . Asthma 02/25/2018  . Status post normal vaginal delivery 09/05/2013  . Other specified indication for care or intervention related to labor and delivery, unspecified as to episode of care 09/03/2013  . Pregnant 12/01/2011  . Elevated blood pressure complicating pregnancy,  antepartum 12/01/2011    Past Surgical History:  Procedure Laterality Date  . MULTIPLE TOOTH EXTRACTIONS      Prior to Admission medications   Medication Sig Start Date End Date Taking? Authorizing Provider  albuterol (PROVENTIL HFA;VENTOLIN HFA) 108 (90 Base) MCG/ACT inhaler Inhale 1-2 puffs into the lungs every 6 (six) hours as needed for wheezing or shortness of breath. Patient not taking: Reported on 12/06/2019    [provider]  albuterol (PROVENTIL) (2.5 MG/3ML) 0.083% nebulizer solution Take 3 mLs (2.5 mg total) by nebulization every 4 (four) hours as needed for wheezing or shortness of breath. 01/02/20   Dionne Bucy, MD  albuterol (VENTOLIN HFA) 108 (90 Base) MCG/ACT inhaler Inhale 2 puffs into the lungs every 6 (six) hours as needed for wheezing or shortness of breath. Patient not taking: Reported on 12/06/2019 12/04/18   Linzie Collin, MD  albuterol (VENTOLIN HFA) 108 (90 Base) MCG/ACT inhaler Inhale 2 puffs into the lungs every 4 (four) hours as needed for wheezing or shortness of breath. 01/02/20   Dionne Bucy, MD  docusate sodium (COLACE) 100 MG capsule Take 1 capsule (100 mg total) by mouth 2 (two) times daily as needed. 05/19/19   Hildred Laser, MD  ferrous sulfate (FERROUSUL) 325 (65 FE) MG tablet Take 1 tablet (325 mg total) by mouth 2 (two) times daily. 05/19/19   Hildred Laser, MD  fluticasone (FLOVENT HFA) 110 MCG/ACT inhaler Inhale 1 puff into the lungs 2 (two) times daily. 01/02/20 03/02/20  Dionne Bucy, MD  Prenatal Multivit-Min-Fe-FA (PRE-NATAL PO) Take by mouth.    [provider]    Allergies Iodine and Sulfonamide derivatives  Family History  Problem Relation Age of Onset  . Diabetes Maternal Grandmother   . Hypertension Father   . Heart disease Father   . Lung disease Father   . Healthy Mother   . Anesthesia problems Neg Hx   . Other Neg Hx     Social History Social History   Tobacco Use  . Smoking status:  Former Smoker    Packs/day: 0.25    Years: 5.00    Pack years: 1.25    Types: Cigarettes    Quit date: 12/27/2012    Years since quitting: 7.0  . Smokeless tobacco: Never Used  Vaping Use  . Vaping Use: Never used  Substance Use Topics  . Alcohol use: No  . Drug use: Yes    Types: Marijuana    Review of Systems  Constitutional: No fever/chills Eyes: No visual changes. ENT: No sore throat. Cardiovascular: Denies chest pain. Respiratory: Denies shortness of breath. Gastrointestinal: Positive for abdominal pain.  No nausea, no vomiting.  No diarrhea.  No constipation. Genitourinary: Negative for dysuria.  Positive for vaginal bleeding. Musculoskeletal: Negative for back pain. Skin: Negative for rash. Neurological: Negative for headaches, focal weakness or numbness.  ____________________________________________   PHYSICAL EXAM:  VITAL SIGNS: ED Triage Vitals [01/08/20 1730]  Enc Vitals Group     BP 138/77     Pulse Rate 86     Resp 18     Temp 98.9 F (37.2 C)     Temp Source Oral     SpO2 100 %     Weight 230 lb (104.3 kg)     Height 5\' 5"  (1.651 m)     Head Circumference      Peak Flow      Pain Score 10     Pain Loc      Pain Edu?      Excl. in GC?     Constitutional: Alert and oriented. Eyes: Conjunctivae are normal. Head: Atraumatic. Nose: No congestion/rhinnorhea. Mouth/Throat: Mucous membranes are moist. Neck: Normal ROM Cardiovascular: Normal rate, regular rhythm. Grossly normal heart sounds. Respiratory: Normal respiratory effort.  No retractions. Lungs CTAB. Gastrointestinal: Soft and nontender. No distention. Genitourinary: Moderate amount of blood in the vaginal vault with no tissue noted, no cervical motion or neck cell tenderness. Musculoskeletal: No lower extremity tenderness nor edema. Neurologic:  Normal speech and language. No gross focal neurologic deficits are appreciated. Skin:  Skin is warm, dry and intact. No rash  noted. Psychiatric: Mood and affect are normal. Speech and behavior are normal.  ____________________________________________   LABS (all labs ordered are listed, but only abnormal results are displayed)  Labs Reviewed  WET PREP, GENITAL - Abnormal; Notable for the following components:      Result Value   WBC, Wet Prep HPF POC FEW (*)    All other components within normal limits  CBC WITH DIFFERENTIAL/PLATELET - Abnormal; Notable for the following components:   WBC 11.2 (*)    Hemoglobin 11.0 (*)    HCT 31.9 (*)    MCV 78.2 (*)    Lymphs Abs 4.9 (*)    All other components within normal limits  BASIC METABOLIC PANEL - Abnormal; Notable for the following components:   Glucose, Bld 101 (*)    Calcium 8.6 (*)    All other components within normal limits  HCG, QUANTITATIVE, PREGNANCY - Abnormal; Notable for the following components:   hCG, Beta Chain,  Quant, [place] 9,983 (*)    All other components within normal limits  POC URINE PREG, ED - Abnormal; Notable for the following components:   Preg Test, Ur Positive (*)    All other components within normal limits  CHLAMYDIA/NGC RT PCR ([facility] ONLY)  URINALYSIS, COMPLETE (UACMP) WITH MICROSCOPIC  ABO/RH    PROCEDURES  Procedure(s) performed (including Critical Care):  Procedures   ____________________________________________   INITIAL IMPRESSION / ASSESSMENT AND PLAN / ED COURSE       29 year old female with past medical history of asthma, G7 P5-0-1-5 at approximately 9 weeks of pregnancy presents to the ED complaining of vaginal bleeding and pelvic pain starting today.  Pelvic exam shows moderate amount of blood but no tissue.  Ultrasound performed from triage and no longer shows an intrauterine pregnancy, patient symptoms likely due to a miscarriage.  H&H is stable at this time and patient is appropriate for discharge home with OB/GYN follow-up.  Pain was treated with Tylenol and patient was counseled to return to the  ED for new worsening symptoms, patient agrees with plan.      ____________________________________________   FINAL CLINICAL IMPRESSION(S) / ED DIAGNOSES  Final diagnoses:  Miscarriage  Vaginal bleeding in pregnancy     ED Discharge Orders    None       Note:  This document was prepared using Dragon voice recognition software and may include unintentional dictation errors.   Chesley Noon, MD 01/08/20 2159

## 2020-01-08 NOTE — ED Triage Notes (Signed)
First Nurse Note:  C/O vaginal bleeding and lower abdominal pain.  States she is 9 weeks pregnancy.  G7 P5.

## 2020-01-08 NOTE — Telephone Encounter (Signed)
Patient called in, was upset. Patient stated she had been messaging with the nurse earlier and that she began to fill a women's pad with blood and that she is now experiencing abdominal cramping. Patient believes she is having a miscarriage, patient was advised that if she was in a lot of discomfort she could go to the ED. Informed her that we were closing soon and that we were closed until the 30th.  Could you please advise?

## 2020-01-08 NOTE — ED Notes (Signed)
Pt attempted to provide urine sample, unable. Small blood clots noted

## 2020-01-24 ENCOUNTER — Encounter: Payer: Medicaid Other | Admitting: Certified Nurse Midwife

## 2020-02-14 ENCOUNTER — Encounter: Payer: Medicaid Other | Admitting: Certified Nurse Midwife

## 2020-02-16 NOTE — L&D Delivery Note (Signed)
° ° °     Delivery Note   Brittany [name] is a 30 y.o. [id number] at [redacted]w[redacted]d Estimated Date of Delivery: 02/28/21  PRE-OPERATIVE DIAGNOSIS:  1) [redacted]w[redacted]d pregnancy.    POST-OPERATIVE DIAGNOSIS:  1) [redacted]w[redacted]d pregnancy s/p Vaginal, Spontaneous    Delivery Type: Vaginal, Spontaneous    Delivery Anesthesia: None   Labor Complications:  none    ESTIMATED BLOOD LOSS: 350  ml    FINDINGS:   1) female infant, Apgar scores of    at 1 minute and    at 5 minutes and a birthweight pending infant remains skin to skin.   2) Nuchal cord: yes  SPECIMENS:   PLACENTA:   Appearance: Intact , 3 vessel cord, cord blood sample collected   Removal: Spontaneous      Disposition:  per protocol   DISPOSITION:  Infant to left in stable condition in the delivery room, with L&D personnel and mother,  NARRATIVE SUMMARY: Labor course:  [name] is a [id number] at [redacted]w[redacted]d who presented for PROM.  She progressed well in labor with Cytotec and  pitocin.  She received no anesthesia and proceeded to complete dilation. She evidenced good maternal expulsive effort during the second stage. She went on to deliver a viable female infant. The placenta delivered without problems and was noted to be complete. A perineal and vaginal examination was performed. Lacerations: None  . Pt has history of postpartum hemorrhage . Tranexamic acid administered IV . The patient tolerated this well.  Doreene Burke, CNM  02/12/2021 9:20 AM

## 2020-06-25 ENCOUNTER — Other Ambulatory Visit: Payer: Self-pay

## 2020-06-25 ENCOUNTER — Encounter: Payer: Self-pay | Admitting: Certified Nurse Midwife

## 2020-06-25 ENCOUNTER — Ambulatory Visit (INDEPENDENT_AMBULATORY_CARE_PROVIDER_SITE_OTHER): Payer: Medicaid Other | Admitting: Certified Nurse Midwife

## 2020-06-25 VITALS — BP 119/80 | HR 78 | Resp 16 | Ht 65.0 in | Wt 217.2 lb

## 2020-06-25 DIAGNOSIS — Z8759 Personal history of other complications of pregnancy, childbirth and the puerperium: Secondary | ICD-10-CM

## 2020-06-25 DIAGNOSIS — Z3200 Encounter for pregnancy test, result unknown: Secondary | ICD-10-CM | POA: Diagnosis not present

## 2020-06-25 LAB — POCT URINE PREGNANCY: Preg Test, Ur: POSITIVE — AB

## 2020-06-25 MED ORDER — DOXYLAMINE-PYRIDOXINE 10-10 MG PO TBEC: 1.0000 | DELAYED_RELEASE_TABLET | Freq: Four times a day (QID) | ORAL | 5 refills | Status: DC

## 2020-06-25 NOTE — Patient Instructions (Signed)
https://www.[facility].org/womens-health/faqs/[email or web address]-genetic-[email or web address]-tests">  Prenatal Care Prenatal care is health care during pregnancy. It helps you and your unborn baby (fetus) stay as healthy as possible. Prenatal care may be provided by a midwife, a family practice doctor, a mid-level practitioner (nurse practitioner or physician assistant), or a childbirth and pregnancy doctor (obstetrician). How does this affect me? During pregnancy, you will be closely monitored for any new conditions that might develop. To lower your risk of pregnancy complications, you and your health care provider will talk about any underlying conditions you have. How does this affect my baby? Early and consistent prenatal care increases the chance that your baby will be healthy during pregnancy. Prenatal care lowers the risk that your baby will be:  Born early (prematurely).  Smaller than expected at birth (small for gestational age). What can I expect at the first prenatal care visit? Your first prenatal care visit will likely be the longest. You should schedule your first prenatal care visit as soon as you know that you are pregnant. Your first visit is a good time to talk about any questions or concerns you have about pregnancy. Medical history At your visit, you and your health care provider will talk about your medical history, including:  Any past pregnancies.  Your family's medical history.  Medical history of the baby's father.  Any long-term (chronic) health conditions you have and how you manage them.  Any surgeries or procedures you have had.  Any current over-the-counter or prescription medicines, herbs, or supplements that you are taking.  Other factors that could pose a risk to your baby, including: ? Exposure to harmful chemicals or radiation at work or at home. ? Any substance use, including tobacco, alcohol, and drug use.  Your home setting and your stress levels, including: ? Exposure to  abuse or violence. ? Household financial strain.  Your daily health habits, including diet and exercise. Tests and screenings Your health care provider will:  Measure your weight, height, and blood pressure.  Do a physical exam, including a pelvic and breast exam.  Perform blood tests and urine tests to check for: ? Urinary tract infection. ? Sexually transmitted infections (STIs). ? Low iron levels in your blood (anemia). ? Blood type and certain proteins on red blood cells (Rh antibodies). ? Infections and immunity to viruses, such as hepatitis B and rubella. ? HIV (human immunodeficiency virus).  Discuss your options for genetic screening. Tips about staying healthy Your health care provider will also give you information about how to keep yourself and your baby healthy, including:  Nutrition and taking vitamins.  Physical activity.  How to manage pregnancy symptoms such as nausea and vomiting (morning sickness).  Infections and substances that may be harmful to your baby and how to avoid them.  Food safety.  Dental care.  Working.  Travel.  Warning signs to watch for and when to call your health care provider. How often will I have prenatal care visits? After your first prenatal care visit, you will have regular visits throughout your pregnancy. The visit schedule is often as follows:  Up to week 28 of pregnancy: once every 4 weeks.  28-36 weeks: once every 2 weeks.  After 36 weeks: every week until delivery. Some women may have visits more or less often depending on any underlying health conditions and the health of the baby. Keep all follow-up and prenatal care visits. This is important. What happens during routine prenatal care visits? Your health care provider will:  Measure your weight   and blood pressure.  Check for fetal heart sounds.  Measure the height of your uterus in your abdomen (fundal height). This may be measured starting around week 20 of  pregnancy.  Check the position of your baby inside your uterus.  Ask questions about your diet, sleeping patterns, and whether you can feel the baby move.  Review warning signs to watch for and signs of labor.  Ask about any pregnancy symptoms you are having and how you are dealing with them. Symptoms may include: ? Headaches. ? Nausea and vomiting. ? Vaginal discharge. ? Swelling. ? Fatigue. ? Constipation. ? Changes in your vision. ? Feeling persistently sad or anxious. ? Any discomfort, including back or pelvic pain. ? Bleeding or spotting. Make a list of questions to ask your health care provider at your routine visits.   What tests might I have during prenatal care visits? You may have blood, urine, and imaging tests throughout your pregnancy, such as:  Urine tests to check for glucose, protein, or signs of infection.  Glucose tests to check for a form of diabetes that can develop during pregnancy (gestational diabetes mellitus). This is usually done around week 24 of pregnancy.  Ultrasounds to check your baby's growth and development, to check for birth defects, and to check your baby's well-being. These can also help to decide when you should deliver your baby.  A test to check for group B strep (GBS) infection. This is usually done around week 36 of pregnancy.  Genetic testing. This may include blood, fluid, or tissue sampling, or imaging tests, such as an ultrasound. Some genetic tests are done during the first trimester and some are done during the second trimester. What else can I expect during prenatal care visits? Your health care provider may recommend getting certain vaccines during pregnancy. These may include:  A yearly flu shot (annual influenza vaccine). This is especially important if you will be pregnant during flu season.  Tdap (tetanus, diphtheria, pertussis) vaccine. Getting this vaccine during pregnancy can protect your baby from whooping cough  (pertussis) after birth. This vaccine may be recommended between weeks 27 and 36 of pregnancy.  A COVID-19 vaccine. Later in your pregnancy, your health care provider may give you information about:  Childbirth and breastfeeding classes.  Choosing a health care provider for your baby.  Umbilical cord banking.  Breastfeeding.  Birth control after your baby is born.  The hospital labor and delivery unit and how to set up a tour.  Registering at the hospital before you go into labor. Where to find more information  Office on Women's Health: womenshealth.gov  American Pregnancy Association: americanpregnancy.org  March of Dimes: marchofdimes.org Summary  Prenatal care helps you and your baby stay as healthy as possible during pregnancy.  Your first prenatal care visit will most likely be the longest.  You will have visits and tests throughout your pregnancy to monitor your health and your baby's health.  Bring a list of questions to your visits to ask your health care provider.  Make sure to keep all follow-up and prenatal care visits. This information is not intended to replace advice given to you by your health care provider. Make sure you discuss any questions you have with your health care provider. Document Revised: 11/15/2019 Document Reviewed: 11/15/2019 Elsevier Patient Education  2021 [employer]  

## 2020-06-25 NOTE — Progress Notes (Signed)
Subjective:    Brittany [name] is a 30 y.o. female who presents for evaluation of amenorrhea. She believes she could be pregnant. Pregnancy is desired. Sexual Activity: single partner, contraception: none. Current symptoms also include: fatigue and nausea. Last period was unknown.   No LMP recorded (lmp unknown). Patient is pregnant. The following portions of the patient's history were reviewed and updated as appropriate: allergies, current medications, past family history, past medical history, past social history, past surgical history and problem list.  Review of Systems Pertinent items are noted in HPI.     Objective:    BP 119/80   Pulse 78   Resp 16   Ht 5\' 5"  (1.651 m)   Wt 217 lb 3.2 oz (98.5 kg)   LMP  (LMP Unknown)   BMI 36.14 kg/m  General: alert, cooperative, appears stated age, mild distress and no acute distress    Lab Review Urine HCG: positive    Assessment:    Absence of menstruation.     Plan:   Positive: EDC: unknown Briefly discussed pre-natal care options. Md and midwifery service reviewed.  Encouraged well-balanced diet, plenty of rest when needed, pre-natal vitamins daily and walking for exercise. Discussed self-help for nausea, avoiding OTC medications until consulting provider or pharmacist, other than Tylenol as needed, minimal caffeine (1-2 cups daily) and avoiding alcohol. She will schedule her initial u/s as soon as able, nurse visit in 2 wks and NOB visit in the next month . 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  Feel free to call with any questions.   , CNM

## 2020-06-26 LAB — PROGESTERONE: Progesterone: 13 ng/mL

## 2020-06-26 LAB — BETA HCG QUANT (REF LAB): hCG Quant: 828 m[IU]/mL

## 2020-06-30 MED ORDER — PROGESTERONE 200 MG PO CAPS: 400.0000 mg | ORAL_CAPSULE | Freq: Two times a day (BID) | ORAL | 0 refills | Status: DC

## 2020-07-10 ENCOUNTER — Other Ambulatory Visit: Payer: Self-pay

## 2020-07-10 ENCOUNTER — Ambulatory Visit (INDEPENDENT_AMBULATORY_CARE_PROVIDER_SITE_OTHER): Payer: Medicaid Other

## 2020-07-10 VITALS — BP 138/79 | HR 84 | Ht 65.0 in | Wt 219.9 lb

## 2020-07-10 DIAGNOSIS — R638 Other symptoms and signs concerning food and fluid intake: Secondary | ICD-10-CM

## 2020-07-10 DIAGNOSIS — Z113 Encounter for screening for infections with a predominantly sexual mode of transmission: Secondary | ICD-10-CM

## 2020-07-10 DIAGNOSIS — Z0283 Encounter for blood-alcohol and blood-drug test: Secondary | ICD-10-CM | POA: Diagnosis not present

## 2020-07-10 DIAGNOSIS — Z3481 Encounter for supervision of other normal pregnancy, first trimester: Secondary | ICD-10-CM

## 2020-07-10 MED ORDER — FLUTICASONE PROPIONATE HFA 110 MCG/ACT IN AERO: 1.0000 | INHALATION_SPRAY | Freq: Two times a day (BID) | RESPIRATORY_TRACT | 1 refills | Status: DC

## 2020-07-10 MED ORDER — ONDANSETRON HCL 4 MG PO TABS: 4.0000 mg | ORAL_TABLET | Freq: Three times a day (TID) | ORAL | 0 refills | Status: DC | PRN

## 2020-07-10 MED ORDER — ALBUTEROL SULFATE HFA 108 (90 BASE) MCG/ACT IN AERS: 2.0000 | INHALATION_SPRAY | RESPIRATORY_TRACT | 1 refills | Status: DC | PRN

## 2020-07-10 NOTE — Patient Instructions (Signed)
https://www.[facility].org/womens-health/faqs/[email or web address]-[email or web address]-tests">  Prenatal Care Prenatal care is health care during pregnancy. It helps you and your unborn baby (fetus) stay as healthy as possible. Prenatal care may be provided by a midwife, a family practice doctor, a IT consultant (nurse practitioner or physician assistant), or a childbirth and pregnancy doctor (obstetrician). How does this affect me? During pregnancy, you will be closely monitored for any new conditions that might develop. To lower your risk of pregnancy complications, you and your health care provider will talk about any underlying conditions you have. How does this affect my baby? Early and consistent prenatal care increases the chance that your baby will be healthy during pregnancy. Prenatal care lowers the risk that your baby will be:  Born early (prematurely).  Smaller than expected at birth (small for gestational age). What can I expect at the first prenatal care visit? Your first prenatal care visit will likely be the longest. You should schedule your first prenatal care visit as soon as you know that you are pregnant. Your first visit is a good time to talk about any questions or concerns you have about pregnancy. Medical history At your visit, you and your health care provider will talk about your medical history, including:  Any past pregnancies.  Your family's medical history.  Medical history of the baby's father.  Any long-term (chronic) health conditions you have and how you manage them.  Any surgeries or procedures you have had.  Any current over-the-counter or prescription medicines, herbs, or supplements that you are taking.  Other factors that could pose a risk to your baby, including: ? Exposure to harmful chemicals or radiation at work or at home. ? Any substance use, including tobacco, alcohol, and drug use.  Your home setting and your stress levels, including: ? Exposure to  abuse or violence. ? Household financial strain.  Your daily health habits, including diet and exercise. Tests and screenings Your health care provider will:  Measure your weight, height, and blood pressure.  Do a physical exam, including a pelvic and breast exam.  Perform blood tests and urine tests to check for: ? Urinary tract infection. ? Sexually transmitted infections (STIs). ? Low iron levels in your blood (anemia). ? Blood type and certain proteins on red blood cells (Rh antibodies). ? Infections and immunity to viruses, such as hepatitis B and rubella. ? HIV (human immunodeficiency virus).  Discuss your options for genetic screening. Tips about staying healthy Your health care provider will also give you information about how to keep yourself and your baby healthy, including:  Nutrition and taking vitamins.  Physical activity.  How to manage pregnancy symptoms such as nausea and vomiting (morning sickness).  Infections and substances that may be harmful to your baby and how to avoid them.  Food safety.  Dental care.  Working.  Travel.  Warning signs to watch for and when to call your health care provider. How often will I have prenatal care visits? After your first prenatal care visit, you will have regular visits throughout your pregnancy. The visit schedule is often as follows:  Up to week 28 of pregnancy: once every 4 weeks.  28-36 weeks: once every 2 weeks.  After 36 weeks: every week until delivery. Some women may have visits more or less often depending on any underlying health conditions and the health of the baby. Keep all follow-up and prenatal care visits. This is important. What happens during routine prenatal care visits? Your health care provider will:  Measure your weight  and blood pressure.  Check for fetal heart sounds.  Measure the height of your uterus in your abdomen (fundal height). This may be measured starting around week 20 of  pregnancy.  Check the position of your baby inside your uterus.  Ask questions about your diet, sleeping patterns, and whether you can feel the baby move.  Review warning signs to watch for and signs of labor.  Ask about any pregnancy symptoms you are having and how you are dealing with them. Symptoms may include: ? Headaches. ? Nausea and vomiting. ? Vaginal discharge. ? Swelling. ? Fatigue. ? Constipation. ? Changes in your vision. ? Feeling persistently sad or anxious. ? Any discomfort, including back or pelvic pain. ? Bleeding or spotting. Make a list of questions to ask your health care provider at your routine visits.   What tests might I have during prenatal care visits? You may have blood, urine, and imaging tests throughout your pregnancy, such as:  Urine tests to check for glucose, protein, or signs of infection.  Glucose tests to check for a form of diabetes that can develop during pregnancy (gestational diabetes mellitus). This is usually done around week 24 of pregnancy.  Ultrasounds to check your baby's growth and development, to check for birth defects, and to check your baby's well-being. These can also help to decide when you should deliver your baby.  A test to check for group B strep (GBS) infection. This is usually done around week 36 of pregnancy.  Genetic testing. This may include blood, fluid, or tissue sampling, or imaging tests, such as an ultrasound. Some genetic tests are done during the first trimester and some are done during the second trimester. What else can I expect during prenatal care visits? Your health care provider may recommend getting certain vaccines during pregnancy. These may include:  A yearly flu shot (annual influenza vaccine). This is especially important if you will be pregnant during flu season.  Tdap (tetanus, diphtheria, pertussis) vaccine. Getting this vaccine during pregnancy can protect your baby from whooping cough  (pertussis) after birth. This vaccine may be recommended between weeks 27 and 36 of pregnancy.  A COVID-19 vaccine. Later in your pregnancy, your health care provider may give you information about:  Childbirth and breastfeeding classes.  Choosing a health care provider for your baby.  Umbilical cord banking.  Breastfeeding.  Birth control after your baby is born.  The hospital labor and delivery unit and how to set up a tour.  Registering at the hospital before you go into labor. Where to find more information  Office on Women's Health: LegalWarrants.gl  American Pregnancy Association: americanpregnancy.org  March of Dimes: marchofdimes.org Summary  Prenatal care helps you and your baby stay as healthy as possible during pregnancy.  Your first prenatal care visit will most likely be the longest.  You will have visits and tests throughout your pregnancy to monitor your health and your baby's health.  Bring a list of questions to your visits to ask your health care provider.  Make sure to keep all follow-up and prenatal care visits. This information is not intended to replace advice given to you by your health care provider. Make sure you discuss any questions you have with your health care provider. Document Revised: 11/15/2019 Document Reviewed: 11/15/2019 Elsevier Patient Education  2021 [place]. Commonly Asked Questions During Pregnancy  Cats: A parasite can be excreted in cat feces.  To avoid exposure you need to have another person empty the little  box.  If you must empty the litter box you will need to wear gloves.  Wash your hands after handling your cat.  This parasite can also be found in raw or undercooked meat so this should also be avoided.  Colds, Sore Throats, Flu: Please check your medication sheet to see what you can take for symptoms.  If your symptoms are unrelieved by these medications please call the office.  Dental Work: Most any dental work  Investment banker, corporate recommends is permitted.  X-rays should only be taken during the first trimester if absolutely necessary.  Your abdomen should be shielded with a lead apron during all x-rays.  Please notify your provider prior to receiving any x-rays.  Novocaine is fine; gas is not recommended.  If your dentist requires a note from [place] prior to dental work please call the office and we will provide one for you.  Exercise: Exercise is an important part of staying healthy during your pregnancy.  You may continue most exercises you were accustomed to prior to pregnancy.  Later in your pregnancy you will most likely notice you have difficulty with activities requiring balance like riding a bicycle.  It is important that you listen to your body and avoid activities that put you at a higher risk of falling.  Adequate rest and staying well hydrated are a must!  If you have questions about the safety of specific activities ask your provider.    Exposure to Children with illness: Try to avoid obvious exposure; report any symptoms to [place] when noted,  If you have chicken pos, red measles or mumps, you should be immune to these diseases.   Please do not take any vaccines while pregnant unless you have checked with your OB provider.  Fetal Movement: After 28 weeks we recommend you do "kick counts" twice daily.  Lie or sit down in a calm quiet environment and count your baby movements "kicks".  You should feel your baby at least 10 times per hour.  If you have not felt 10 kicks within the first hour get up, walk around and have something sweet to eat or drink then repeat for an additional hour.  If count remains less than 10 per hour notify your provider.  Fumigating: Follow your pest control agent's advice as to how long to stay out of your home.  Ventilate the area well before re-entering.  Hemorrhoids:   Most over-the-counter preparations can be used during pregnancy.  Check your medication to see what is safe to use.  It  is important to use a stool softener or fiber in your diet and to drink lots of liquids.  If hemorrhoids seem to be getting worse please call the office.   Hot Tubs:  Hot tubs Jacuzzis and saunas are not recommended while pregnant.  These increase your internal body temperature and should be avoided.  Intercourse:  Sexual intercourse is safe during pregnancy as long as you are comfortable, unless otherwise advised by your provider.  Spotting may occur after intercourse; report any bright red bleeding that is heavier than spotting.  Labor:  If you know that you are in labor, please go to the hospital.  If you are unsure, please call the office and let us help you decide what to do.  Lifting, straining, etc:  If your job requires heavy lifting or straining please check with your provider for any limitations.  Generally, you should not lift items heavier than that you can lift simply with your  hands and arms (no back muscles)  Painting:  Paint fumes do not harm your pregnancy, but may make you ill and should be avoided if possible.  Latex or water based paints have less odor than oils.  Use adequate ventilation while painting.  Permanents & Hair Color:  Chemicals in hair dyes are not recommended as they cause increase hair dryness which can increase hair loss during pregnancy.  " Highlighting" and permanents are allowed.  Dye may be absorbed differently and permanents may not hold as well during pregnancy.  Sunbathing:  Use a sunscreen, as skin burns easily during pregnancy.  Drink plenty of fluids; avoid over heating.  Tanning Beds:  Because their possible side effects are still unknown, tanning beds are not recommended.  Ultrasound Scans:  Routine ultrasounds are performed at approximately 20 weeks.  You will be able to see your baby's general anatomy an if you would like to know the gender this can usually be determined as well.  If it is questionable when you conceived you may also receive an  ultrasound early in your pregnancy for dating purposes.  Otherwise ultrasound exams are not routinely performed unless there is a medical necessity.  Although you can request a scan we ask that you pay for it when conducted because insurance does not cover " patient request" scans.  Work: If your pregnancy proceeds without complications you may work until your due date, unless your physician or employer advises otherwise.  Round Ligament Pain/Pelvic Discomfort:  Sharp, shooting pains not associated with bleeding are fairly common, usually occurring in the second trimester of pregnancy.  They tend to be worse when standing up or when you remain standing for long periods of time.  These are the result of pressure of certain pelvic ligaments called "round ligaments".  Rest, Tylenol and heat seem to be the most effective relief.  As the womb and fetus grow, they rise out of the pelvis and the discomfort improves.  Please notify the office if your pain seems different than that described.  It may represent a more serious condition.  Common Medications Safe in Pregnancy  Acne:      Constipation:  Benzoyl Peroxide     Colace  Clindamycin      Dulcolax Suppository  Topica Erythromycin     Fibercon  Salicylic Acid      Metamucil         Miralax AVOID:        Senakot   Accutane    Cough:  Retin-A       Cough Drops  Tetracycline      Phenergan w/ Codeine if Rx  Minocycline      Robitussin (Plain & DM)  Antibiotics:     Crabs/Lice:  Ceclor       RID  Cephalosporins    AVOID:  E-Mycins      Kwell  Keflex  Macrobid/Macrodantin   Diarrhea:  Penicillin      Kao-Pectate  Zithromax      Imodium AD         PUSH FLUIDS AVOID:       Cipro     Fever:  Tetracycline      Tylenol (Regular or Extra  Minocycline       Strength)  Levaquin      Extra Strength-Do not          Exceed 8 tabs/24 hrs Caffeine:        '200mg'$ /day (equiv. To 1 cup of coffee or  approx. 3 12 oz  sodas)         Gas: Cold/Hayfever:       Gas-X  Benadryl      Mylicon  Claritin       Phazyme  **Claritin-D        Chlor-Trimeton    Headaches:  Dimetapp      ASA-Free Excedrin  Drixoral-Non-Drowsy     Cold Compress  Mucinex (Guaifenasin)     Tylenol (Regular or Extra  Sudafed/Sudafed-12 Hour     Strength)  **Sudafed PE Pseudoephedrine   Tylenol Cold & Sinus     Vicks Vapor Rub  Zyrtec  **AVOID if Problems With Blood Pressure         Heartburn: Avoid lying down for at least 1 hour after meals  Aciphex      Maalox     Rash:  Milk of Magnesia     Benadryl    Mylanta       1% Hydrocortisone Cream  Pepcid  Pepcid Complete   Sleep Aids:  Prevacid      Ambien   Prilosec       Benadryl  Rolaids       Chamomile Tea  Tums (Limit 4/day)     Unisom         Tylenol PM         Warm milk-add vanilla or  Hemorrhoids:       Sugar for taste  Anusol/Anusol H.C.  (RX: Analapram 2.5%)  Sugar Substitutes:  Hydrocortisone OTC     Ok in moderation  Preparation H      Tucks        Vaseline lotion applied to tissue with wiping    Herpes:     Throat:  Acyclovir      Oragel  Famvir  Valtrex     Vaccines:         Flu Shot Leg Cramps:       *Gardasil  Benadryl      Hepatitis A         Hepatitis B Nasal Spray:       Pneumovax  Saline Nasal Spray     Polio Booster         Tetanus Nausea:       Tuberculosis test or PPD  Vitamin B6 25 mg TID   AVOID:    Dramamine      *Gardasil  Emetrol       Live Poliovirus  Ginger Root 250 mg QID    MMR (measles, mumps &  High Complex Carbs @ Bedtime    rebella)  Sea Bands-Accupressure    Varicella (Chickenpox)  Unisom 1/2 tab TID     *No known complications           If received before Pain:         Known pregnancy;   Darvocet       Resume series after  Lortab        Delivery  Percocet    Yeast:   Tramadol      Femstat  Tylenol 3      Gyne-lotrimin  Ultram       Monistat  Vicodin           MISC:         All Sunscreens           Hair  Coloring/highlights          Insect Repellant's          (  Including DEET)         Mystic Tans

## 2020-07-10 NOTE — Progress Notes (Signed)
      Brittany [name] presents for NOB nurse intake visit. Pregnancy confirmation done at [facility], 06/25/2020 , with Doreene Burke, CMN.  G 9.  P 5025.  LMP unknown.  EDD unsure.  Ga unsure. Pregnancy education material explained and given.  0 cats in the home.  NOB labs ordered. BMI greater than 30. TSH/HbgA1c ordered. Sickle cell order due to race. HIV and drug screen explained and ordered. Genetic screening discussed. Genetic testing; desire Avelina Laine will get genetic testing done at NOB PE. Pt to discuss genetic testing with provider. PNV encouraged. Pt to follow up with provider in 2 weeks for NOB physical.  [facility] Finanical Policy, FMLA form and HIV/Drug screening forms reviewed and signed by pt.

## 2020-07-11 LAB — URINALYSIS, ROUTINE W REFLEX MICROSCOPIC
Bilirubin, UA: NEGATIVE
Glucose, UA: NEGATIVE
Nitrite, UA: NEGATIVE
RBC, UA: NEGATIVE
Specific Gravity, UA: 1.027 (ref 1.005–1.030)
Urobilinogen, Ur: 1 mg/dL (ref 0.2–1.0)
pH, UA: 7 (ref 5.0–7.5)

## 2020-07-11 LAB — HEMOGLOBIN A1C
Est. average glucose Bld gHb Est-mCnc: 108 mg/dL
Hgb A1c MFr Bld: 5.4 % (ref 4.8–5.6)

## 2020-07-11 LAB — RPR: RPR Ser Ql: NONREACTIVE

## 2020-07-11 LAB — VIRAL HEPATITIS HBV, HCV
HCV Ab: <0.1 s/co ratio (ref 0.0–0.9)
Hep B Core Total Ab: NEGATIVE
Hep B Surface Ab, Qual: NONREACTIVE
Hepatitis B Surface Ag: NEGATIVE

## 2020-07-11 LAB — MICROSCOPIC EXAMINATION
Casts: NONE SEEN /lpf
Epithelial Cells (non renal): >10 /hpf — AB (ref 0–10)
RBC, Urine: NONE SEEN /hpf (ref 0–2)

## 2020-07-11 LAB — HCV INTERPRETATION

## 2020-07-11 LAB — TSH: TSH: 1.15 u[IU]/mL (ref 0.450–4.500)

## 2020-07-11 LAB — RUBELLA SCREEN: Rubella Antibodies, IGG: 1.34 index (ref >0.99)

## 2020-07-11 LAB — HIV ANTIBODY (ROUTINE TESTING W REFLEX): HIV Screen 4th Generation wRfx: NONREACTIVE

## 2020-07-12 LAB — CULTURE, OB URINE

## 2020-07-12 LAB — URINE CULTURE, OB REFLEX

## 2020-07-12 LAB — GC/CHLAMYDIA PROBE AMP
Chlamydia trachomatis, NAA: NEGATIVE
Neisseria Gonorrhoeae by PCR: NEGATIVE

## 2020-07-21 ENCOUNTER — Other Ambulatory Visit: Payer: Self-pay

## 2020-07-21 ENCOUNTER — Ambulatory Visit
Admission: RE | Admit: 2020-07-21 | Discharge: 2020-07-21 | Disposition: A | Discharge status: Home / Self Care | Payer: Medicaid Other | Source: Ambulatory Visit | Attending: Certified Nurse Midwife | Admitting: Certified Nurse Midwife

## 2020-07-21 DIAGNOSIS — Z3481 Encounter for supervision of other normal pregnancy, first trimester: Secondary | ICD-10-CM | POA: Insufficient documentation

## 2020-07-21 IMAGING — US US OB COMP LESS 14 WK
1 series · 15 of 28 positions shown · non-contrast
Comparison: Ultrasound [DATE].

CLINICAL DATA: Fetal dating and viability determination.

EXAM:
OBSTETRIC <14 WK ULTRASOUND
TECHNIQUE: Transabdominal ultrasound was performed for evaluation of the
gestation as well as the maternal uterus and adnexal regions.

[Series 1: us ob comp less 14 wk · 15 of 55 slices shown]
[im 1/55]
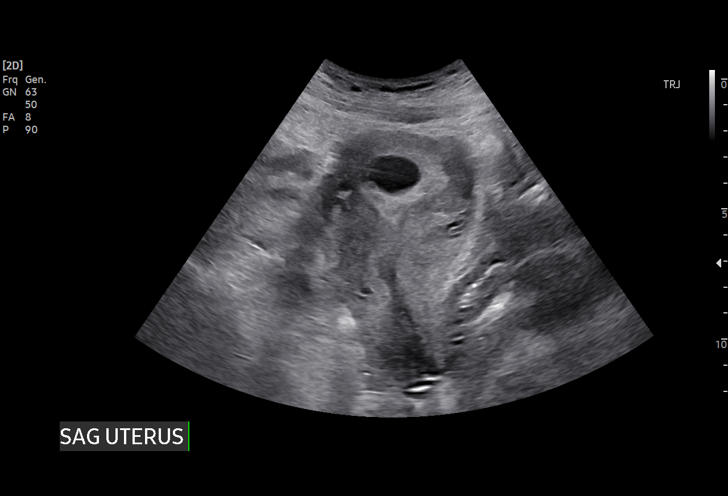
[im 5/55]
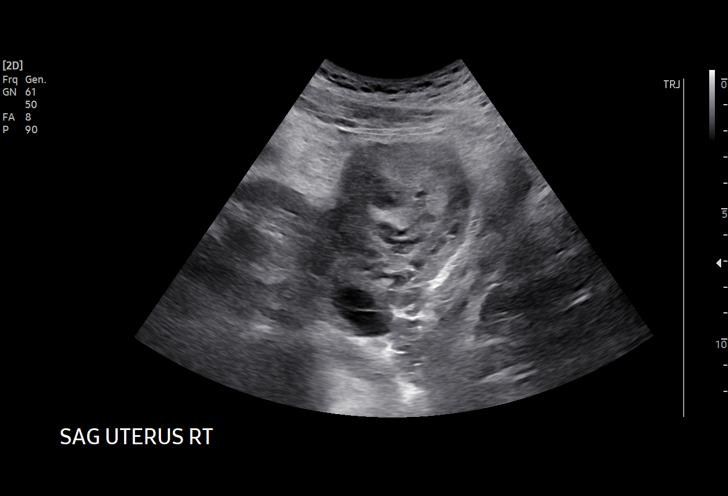
[im 9/55]
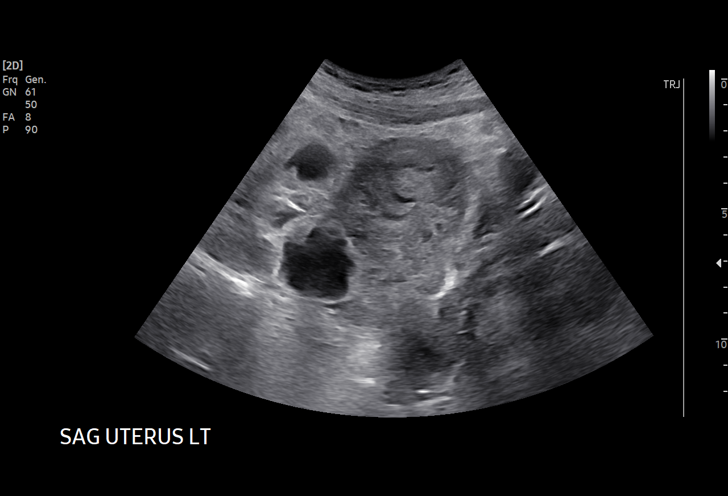
[im 13/55]
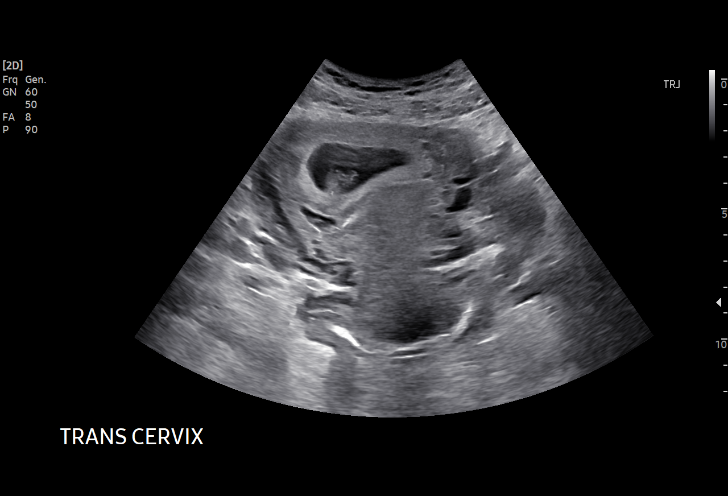
[im 17/55]
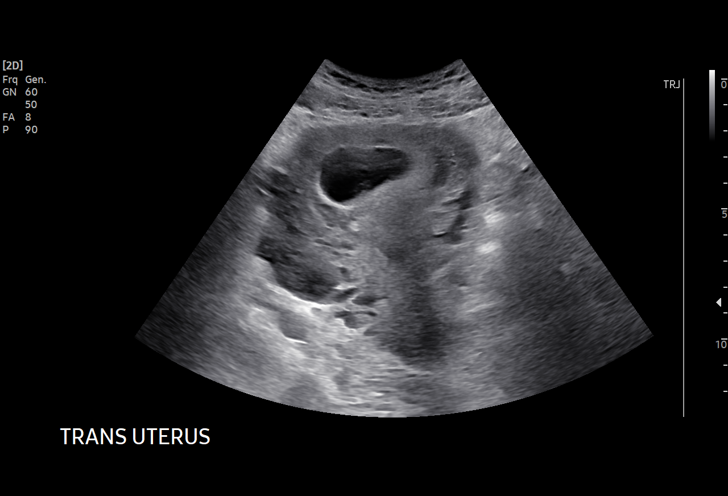
[im 21/55]
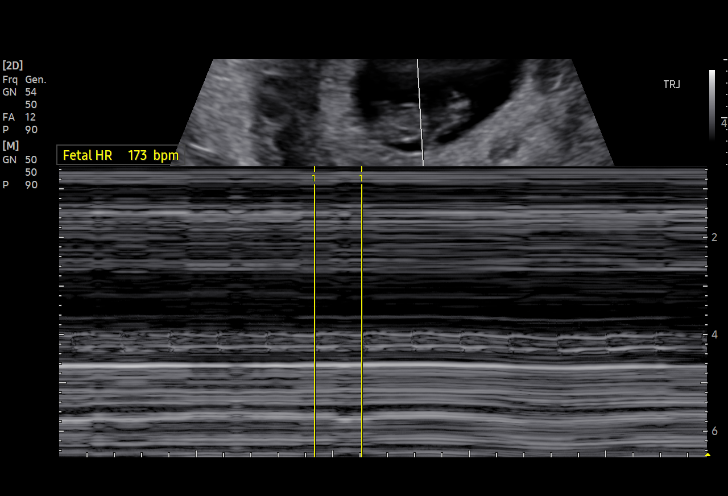
[im 25/55]
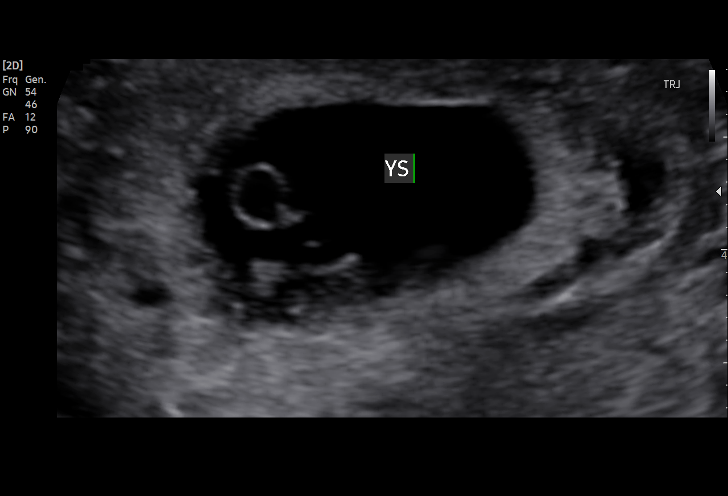
[im 29/55]
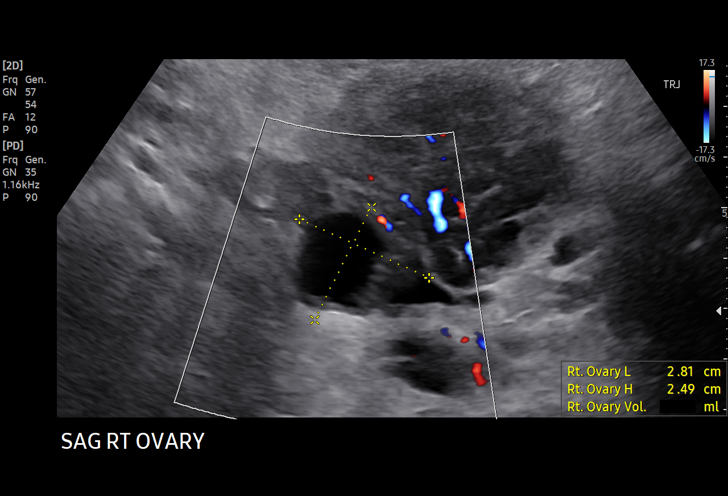
[im 31/55]
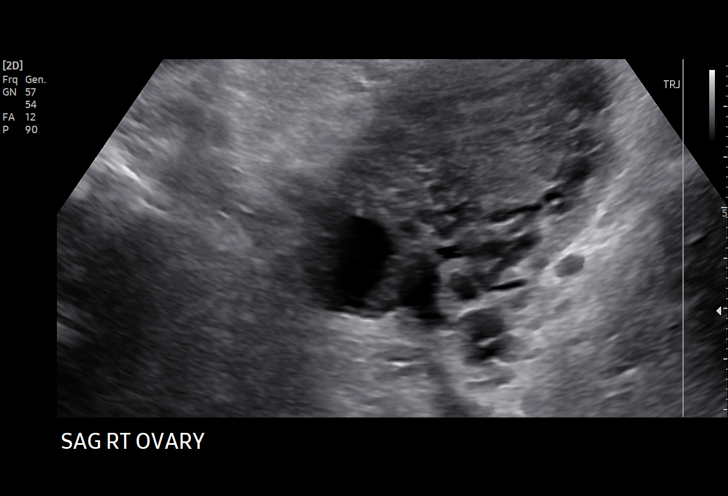
[im 35/55]
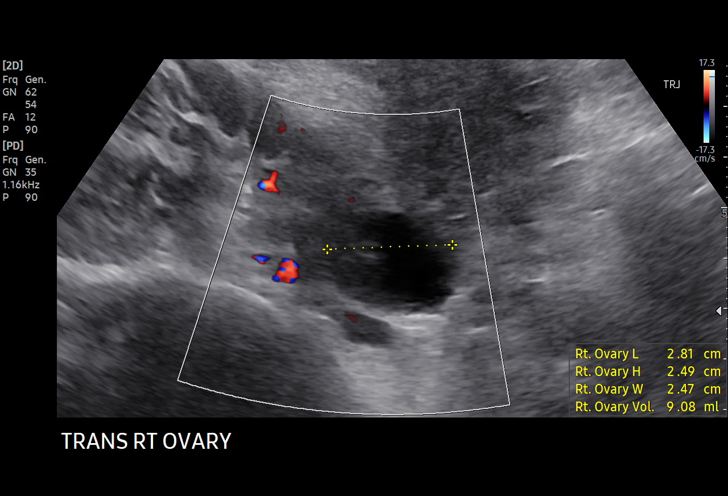
[im 39/55]
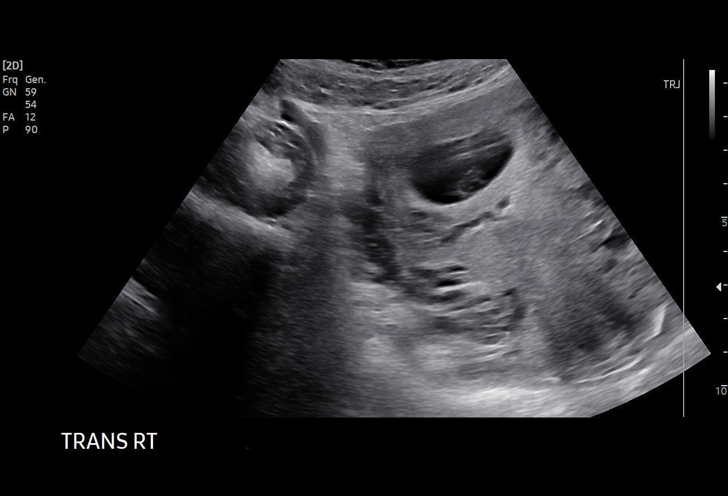
[im 43/55]
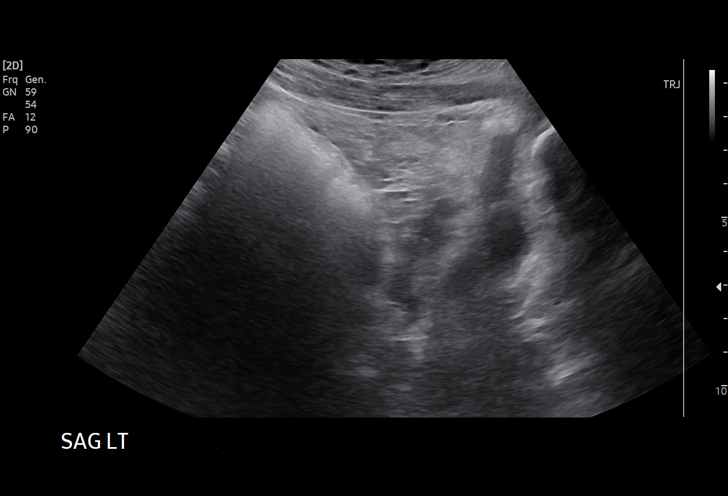
[im 47/55]
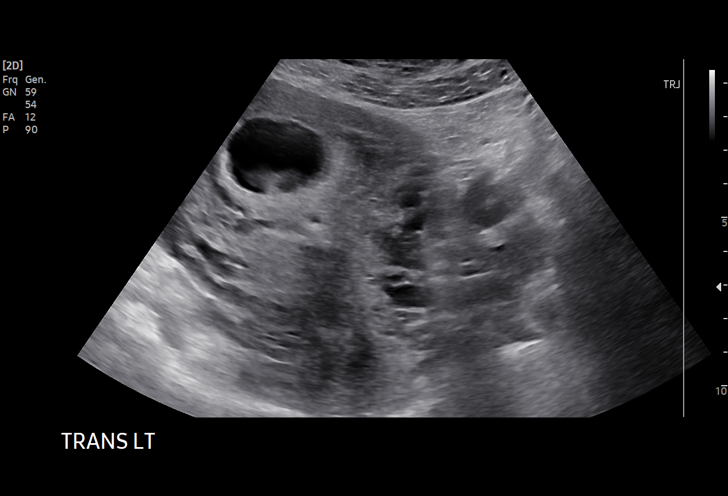
[im 51/55]
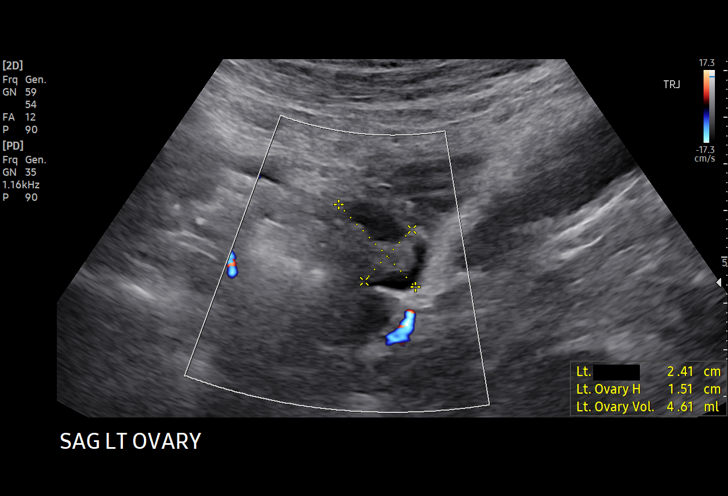
[im 55/55]
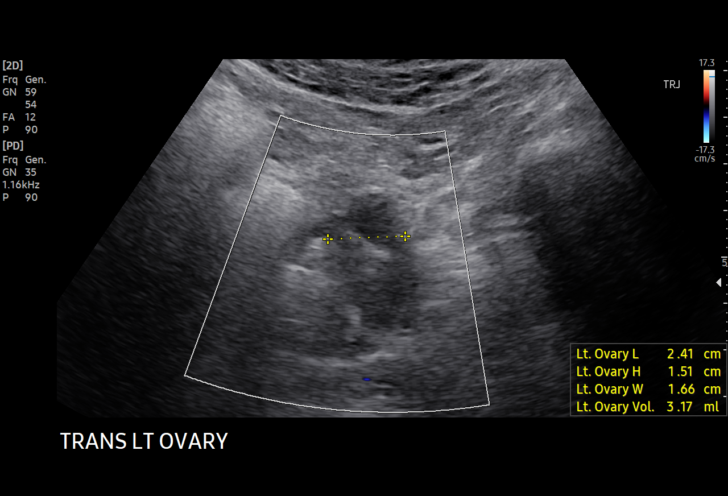

[15 of 28 positions shown; findings below may reference images not displayed]

FINDINGS: Intrauterine gestational sac: Single

Yolk sac:  Present

Embryo:  Present

Cardiac Activity: Present

Heart Rate: 173 bpm

CRL: 17.9 mm   8 w 2 d                  US EDC: [DATE]

Subchorionic hemorrhage:  None visualized.

Maternal uterus/adnexae: Small right ovarian corpus luteal cyst.
IMPRESSION: Single viable intrauterine pregnancy at 8 weeks 2 days.

## 2020-07-22 ENCOUNTER — Telehealth: Payer: Self-pay | Admitting: Certified Nurse Midwife

## 2020-07-22 NOTE — Telephone Encounter (Signed)
Patinet states that she did not get a vaginal ultrasound like she was told she would- pt did get ultrasound that showed her around 10 weeks- I explained that when your that far along sometimes they are able to get what they need from the topical [place]. Pt states that there was a cyst on the [place] that she want the provider to look at .

## 2020-07-23 ENCOUNTER — Other Ambulatory Visit: Payer: Self-pay | Admitting: Certified Nurse Midwife

## 2020-07-23 ENCOUNTER — Encounter: Payer: Medicaid Other | Admitting: Certified Nurse Midwife

## 2020-07-24 LAB — DRUG PROFILE, UR, 9 DRUGS (LABCORP)
Amphetamines, Urine: NEGATIVE ng/mL
Barbiturate Quant, Ur: NEGATIVE ng/mL
Benzodiazepine Quant, Ur: NEGATIVE ng/mL
Cannabinoid Quant, Ur: POSITIVE — AB
Cocaine (Metab.): NEGATIVE ng/mL
Methadone Screen, Urine: NEGATIVE ng/mL
Opiate Quant, Ur: NEGATIVE ng/mL
PCP Quant, Ur: NEGATIVE ng/mL
Propoxyphene: NEGATIVE ng/mL

## 2020-07-24 LAB — NICOTINE SCREEN, URINE: Cotinine Ql Scrn, Ur: POSITIVE ng/mL — AB

## 2020-07-24 NOTE — Telephone Encounter (Signed)
Please advise. Thanks Khylin Gutridge 

## 2020-07-27 ENCOUNTER — Other Ambulatory Visit: Payer: Self-pay

## 2020-07-27 ENCOUNTER — Emergency Department
Admission: EM | Admit: 2020-07-27 | Discharge: 2020-07-28 | Disposition: A | Discharge status: Home / Self Care | Payer: Medicaid Other | Attending: Emergency Medicine | Admitting: Emergency Medicine

## 2020-07-27 DIAGNOSIS — Z87891 Personal history of nicotine dependence: Secondary | ICD-10-CM | POA: Diagnosis not present

## 2020-07-27 DIAGNOSIS — O99611 Diseases of the digestive system complicating pregnancy, first trimester: Secondary | ICD-10-CM | POA: Diagnosis present

## 2020-07-27 DIAGNOSIS — K529 Noninfective gastroenteritis and colitis, unspecified: Secondary | ICD-10-CM | POA: Insufficient documentation

## 2020-07-27 DIAGNOSIS — Z3A09 9 weeks gestation of pregnancy: Secondary | ICD-10-CM | POA: Insufficient documentation

## 2020-07-27 DIAGNOSIS — J45909 Unspecified asthma, uncomplicated: Secondary | ICD-10-CM | POA: Insufficient documentation

## 2020-07-27 DIAGNOSIS — Z7951 Long term (current) use of inhaled steroids: Secondary | ICD-10-CM | POA: Diagnosis not present

## 2020-07-27 LAB — CBC WITH DIFFERENTIAL/PLATELET
Abs Immature Granulocytes: 0.01 K/uL (ref 0.00–0.07)
Basophils Absolute: 0 K/uL (ref 0.0–0.1)
Basophils Relative: 0 %
Eosinophils Absolute: 0 K/uL (ref 0.0–0.5)
Eosinophils Relative: 0 %
HCT: 36.2 % (ref 36.0–46.0)
Hemoglobin: 12.5 g/dL (ref 12.0–15.0)
Immature Granulocytes: 0 %
Lymphocytes Relative: 37 %
Lymphs Abs: 2.6 K/uL (ref 0.7–4.0)
MCH: 27.4 pg (ref 26.0–34.0)
MCHC: 34.5 g/dL (ref 30.0–36.0)
MCV: 79.2 fL — ABNORMAL LOW (ref 80.0–100.0)
Monocytes Absolute: 0.6 K/uL (ref 0.1–1.0)
Monocytes Relative: 9 %
Neutro Abs: 3.8 K/uL (ref 1.7–7.7)
Neutrophils Relative %: 54 %
Platelets: 211 K/uL (ref 150–400)
RBC: 4.57 MIL/uL (ref 3.87–5.11)
RDW: 13.6 % (ref 11.5–15.5)
WBC: 7.1 K/uL (ref 4.0–10.5)
nRBC: 0 % (ref 0.0–0.2)

## 2020-07-27 MED ORDER — ACETAMINOPHEN 500 MG PO TABS
1000.0000 mg | ORAL_TABLET | Freq: Once | ORAL | Status: AC
  Administered 2020-07-28: 1000 mg via ORAL
  Filled 2020-07-27: qty 2

## 2020-07-27 MED ORDER — LACTATED RINGERS IV BOLUS
1000.0000 mL | Freq: Once | INTRAVENOUS | Status: AC
  Administered 2020-07-27: 1000 mL via INTRAVENOUS

## 2020-07-27 MED ORDER — ONDANSETRON HCL 4 MG/2ML IJ SOLN
4.0000 mg | Freq: Once | INTRAMUSCULAR | Status: AC
  Administered 2020-07-27: 4 mg via INTRAVENOUS
  Filled 2020-07-27: qty 2

## 2020-07-27 MED ORDER — KETOROLAC TROMETHAMINE 30 MG/ML IJ SOLN: 15.0000 mg | Freq: Once | INTRAMUSCULAR | Status: DC

## 2020-07-27 NOTE — ED Triage Notes (Signed)
Pt states was diagnosed with covid on Tuesday and has been vomiting and not able to keep po down. Pt states is unsure of last urination. Pt states no diarrhea. Pt appears in no acute distress.

## 2020-07-28 ENCOUNTER — Encounter: Payer: Self-pay | Admitting: Certified Nurse Midwife

## 2020-07-28 ENCOUNTER — Ambulatory Visit (INDEPENDENT_AMBULATORY_CARE_PROVIDER_SITE_OTHER): Payer: Medicaid Other | Admitting: Certified Nurse Midwife

## 2020-07-28 ENCOUNTER — Emergency Department: Payer: Medicaid Other

## 2020-07-28 DIAGNOSIS — O99611 Diseases of the digestive system complicating pregnancy, first trimester: Secondary | ICD-10-CM

## 2020-07-28 DIAGNOSIS — O99891 Other specified diseases and conditions complicating pregnancy: Secondary | ICD-10-CM | POA: Diagnosis not present

## 2020-07-28 DIAGNOSIS — K529 Noninfective gastroenteritis and colitis, unspecified: Secondary | ICD-10-CM | POA: Diagnosis not present

## 2020-07-28 DIAGNOSIS — R112 Nausea with vomiting, unspecified: Secondary | ICD-10-CM | POA: Diagnosis not present

## 2020-07-28 DIAGNOSIS — R109 Unspecified abdominal pain: Secondary | ICD-10-CM

## 2020-07-28 DIAGNOSIS — Z3A09 9 weeks gestation of pregnancy: Secondary | ICD-10-CM

## 2020-07-28 LAB — COMPREHENSIVE METABOLIC PANEL
ALT: 16 U/L (ref 0–44)
AST: 28 U/L (ref 15–41)
Albumin: 3.8 g/dL (ref 3.5–5.0)
Alkaline Phosphatase: 51 U/L (ref 38–126)
Anion gap: 6 (ref 5–15)
BUN: 10 mg/dL (ref 6–20)
CO2: 28 mmol/L (ref 22–32)
Calcium: 8.9 mg/dL (ref 8.9–10.3)
Chloride: 102 mmol/L (ref 98–111)
Creatinine, Ser: 0.72 mg/dL (ref 0.44–1.00)
GFR, Estimated: >60 mL/min (ref >60)
Glucose, Bld: 104 mg/dL — ABNORMAL HIGH (ref 70–99)
Potassium: 3.3 mmol/L — ABNORMAL LOW (ref 3.5–5.1)
Sodium: 136 mmol/L (ref 135–145)
Total Bilirubin: 0.6 mg/dL (ref 0.3–1.2)
Total Protein: 8 g/dL (ref 6.5–8.1)

## 2020-07-28 LAB — URINALYSIS, COMPLETE (UACMP) WITH MICROSCOPIC
Bacteria, UA: NONE SEEN
Bilirubin Urine: NEGATIVE
Glucose, UA: NEGATIVE mg/dL
Hgb urine dipstick: NEGATIVE
Ketones, ur: 5 mg/dL — AB
Leukocytes,Ua: NEGATIVE
Nitrite: NEGATIVE
Protein, ur: 30 mg/dL — AB
Specific Gravity, Urine: 1.025 (ref 1.005–1.030)
pH: 6 (ref 5.0–8.0)

## 2020-07-28 LAB — LIPASE, BLOOD: Lipase: 21 U/L (ref 11–51)

## 2020-07-28 LAB — MAGNESIUM: Magnesium: 2 mg/dL (ref 1.7–2.4)

## 2020-07-28 IMAGING — MR MR PELVIS W/O CM
11 of 19 series · 29 of 48 positions shown · non-contrast
Comparison: None.

CLINICAL DATA: Ten weeks pregnant, recent [I0] diagnosis,
persistent emesis, right lower quadrant pain

EXAM:
MRI ABDOMEN AND PELVIS WITHOUT CONTRAST
TECHNIQUE: Multiplanar multisequence MR imaging of the abdomen and pelvis was
performed. No intravenous contrast was administered.

[Series 4: cor haste · coronal · 5.0mm · 1.25mm/px · 1 of 40 slices shown]
[im 1/40]
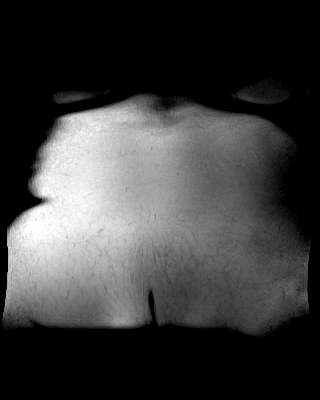

[Series 5: cor haste fs · coronal · 5.0mm · 1.25mm/px · 1 of 40 slices shown]
[im 1/40]
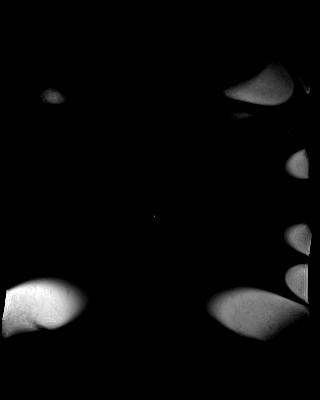

[Series 6: bSSFP · coronal · 5.0mm · 0.78mm/px · 1 of 40 slices shown (1 of 3)]
[im 1/40]
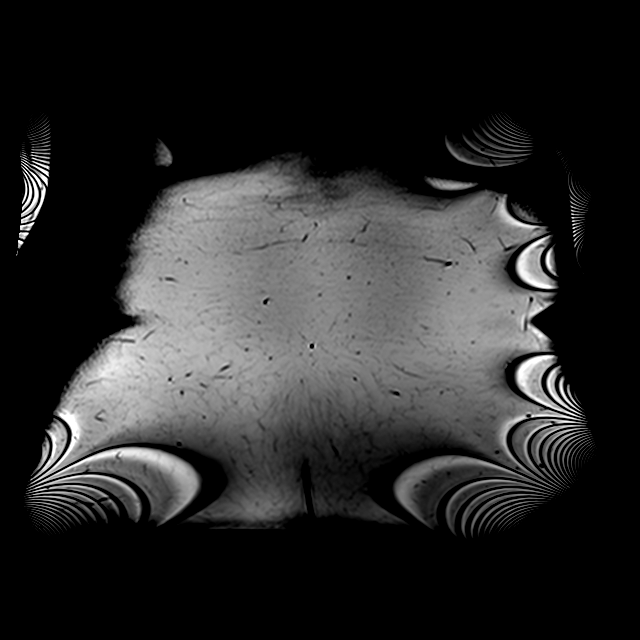

[Series 7: T2 · axial · 4.0mm · 1.19mm/px · z∈[-5,+254]mm · 2 of 55 slices shown (1 of 4)]
[im 1/55]
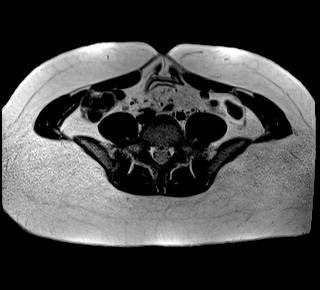
[im 55/55]
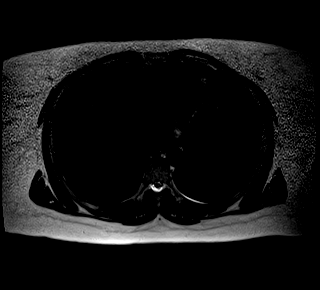

[Series 8: T2 · axial · 4.0mm · 1.19mm/px · z∈[-259,-0]mm · 3 of 55 slices shown (2 of 4)]
[im 1/55]
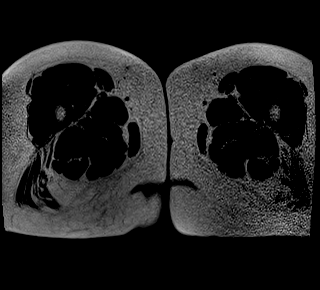
[im 28/55]
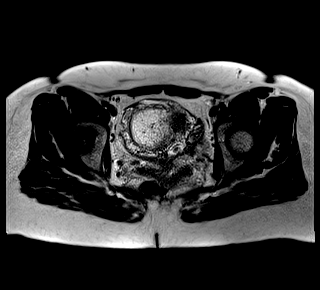
[im 55/55]
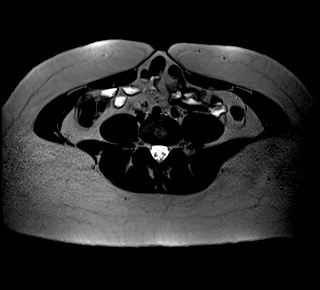

[Series 9: T2 · axial · 4.0mm · 1.19mm/px · z∈[-259,+254]mm · 5 of 108 slices shown (3 of 4)]
[im 1/108]
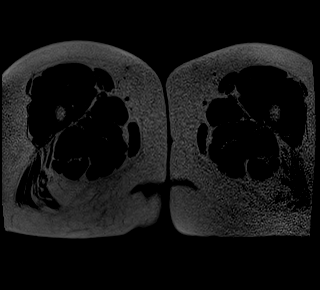
[im 27/108]
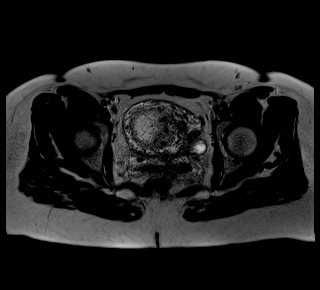
[im 54/108]
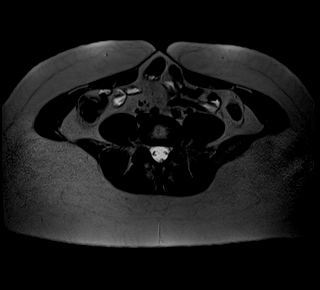
[im 81/108]
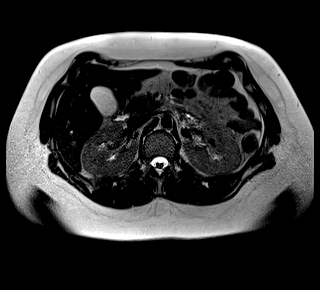
[im 108/108]
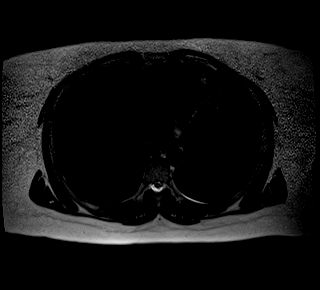

[Series 10: T2 fat-sat · axial · 4.0mm · 1.19mm/px · z∈[-5,+254]mm · 3 of 55 slices shown (1 of 2)]
[im 1/55]
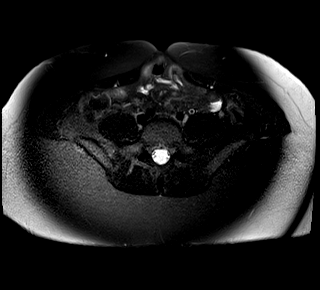
[im 28/55]
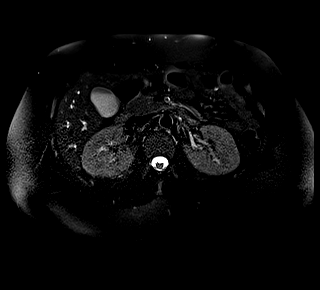
[im 55/55]
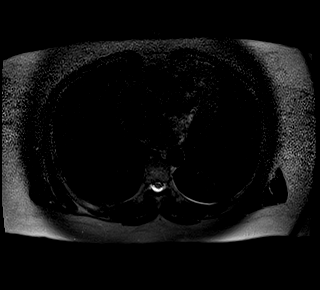

[Series 11: T2 fat-sat · axial · 4.0mm · 1.19mm/px · z∈[-259,-0]mm · 3 of 55 slices shown (2 of 2)]
[im 1/55]
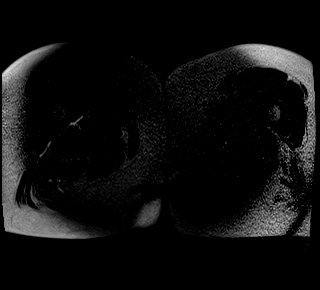
[im 28/55]
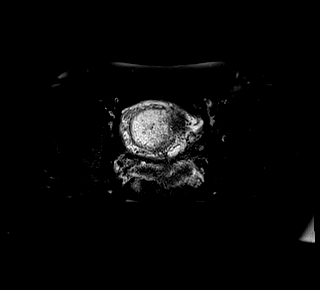
[im 55/55]
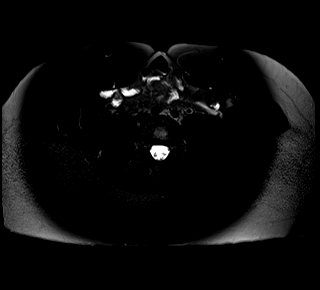

[Series 12: T2 · axial · 4.0mm · 1.19mm/px · z∈[-259,+254]mm · 5 of 108 slices shown (4 of 4)]
[im 1/108]
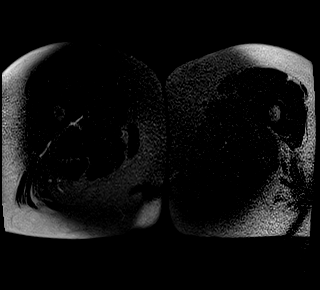
[im 27/108]
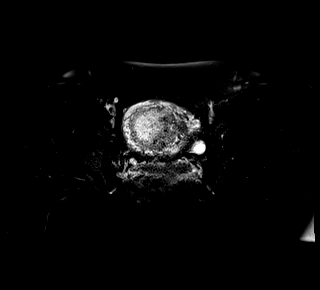
[im 54/108]
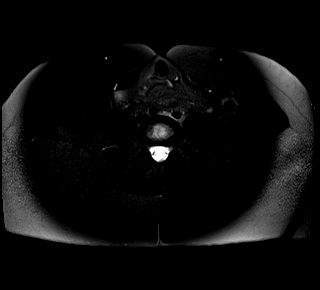
[im 81/108]
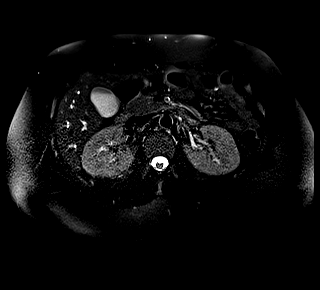
[im 108/108]
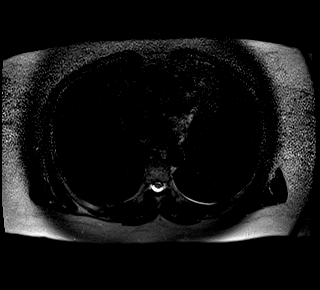

[Series 13: bSSFP · axial · 4.0mm · 0.59mm/px · z∈[-5,+254]mm · 3 of 55 slices shown (2 of 3)]
[im 1/55]
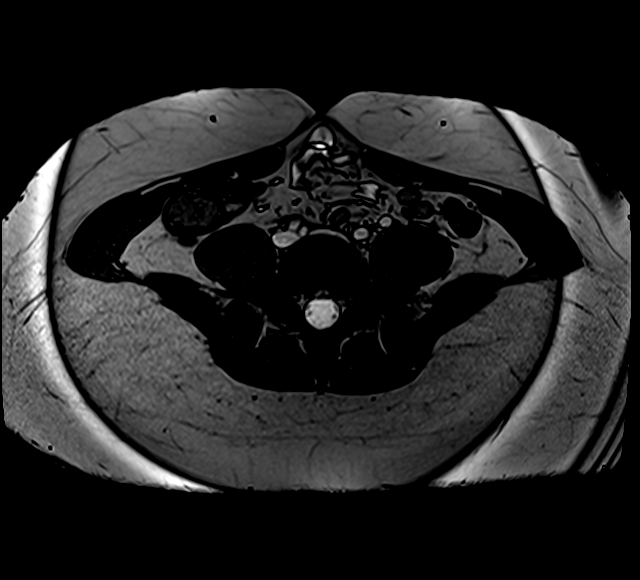
[im 28/55]
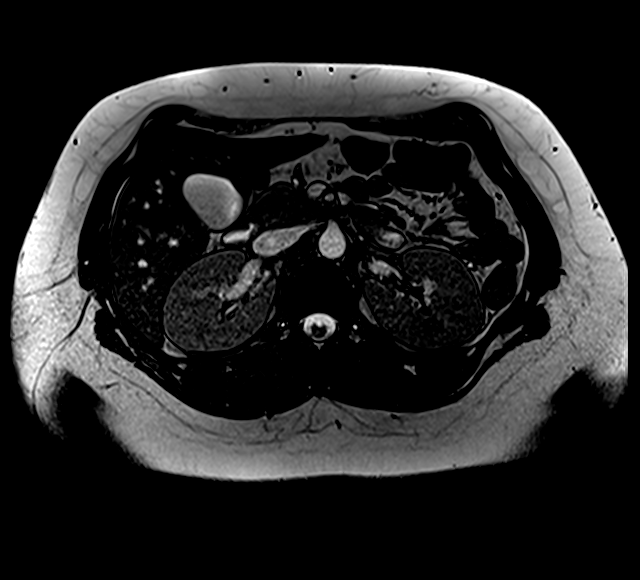
[im 55/55]
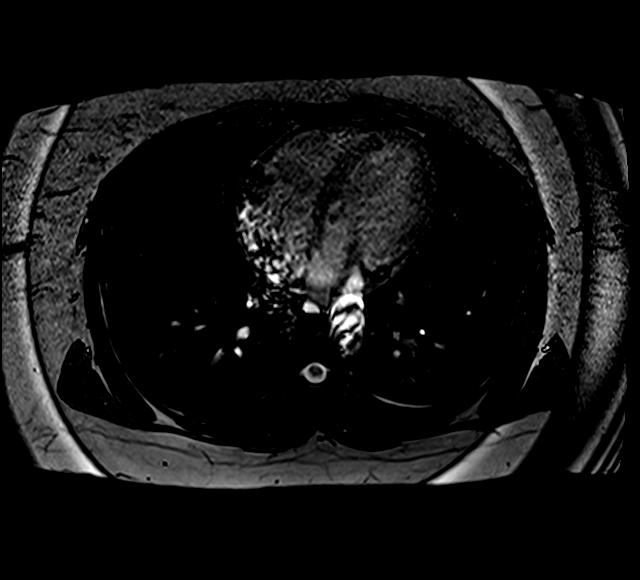

[Series 14: bSSFP · axial · 4.0mm · 0.59mm/px · z∈[-259,-130]mm · 2 of 55 slices shown (3 of 3)]
[im 1/55]
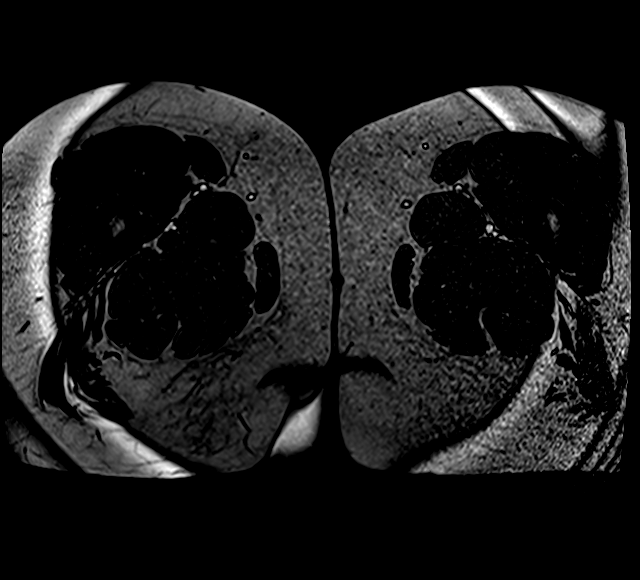
[im 28/55]
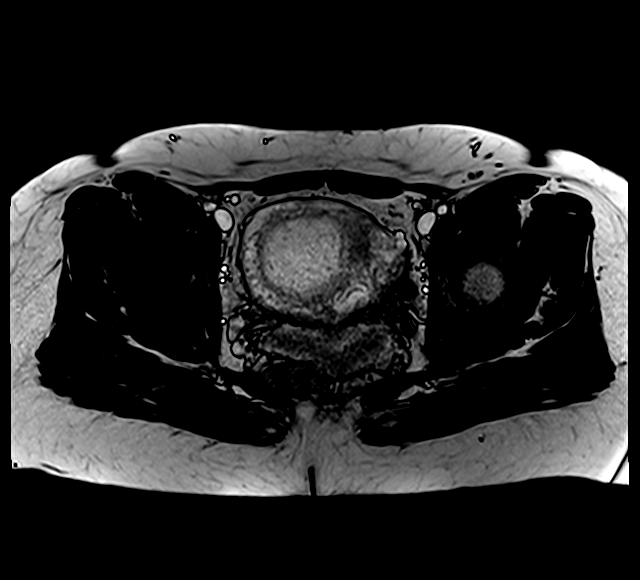

[29 of 48 positions shown; findings below may reference images not displayed]

FINDINGS: COMBINED FINDINGS FOR BOTH MR ABDOMEN AND PELVIS

Lower chest: Lung bases are clear. Normal heart size. No pericardial
effusion.

Hepatobiliary: Few tiny T2 hyperintense probable cysts without
concerning feature in the liver. No concerning focal liver lesion.
Smooth liver surface contour. Normal intrinsic liver signal. Normal
appearance of the gallbladder and biliary tree without visible
filling defects.

Pancreas: No mass, inflammatory changes, or other parenchymal
abnormality identified.

Spleen:  Normal in size. No concerning splenic lesions.

Adrenals/Urinary Tract: Normal adrenal glands. No renal masses. No
obstructive urolithiasis/hydronephrosis. Few punctate foci DA months
cracks gagging imminent and lung the LS to the sign in the right
lower pole could reflect tiny 2-3 mm nonobstructing calculi. Urinary
bladder is decompressed no abnormal bladder wall thickening or
debris.

Stomach/Bowel: A normal appendix is visualized in the right lower
quadrant best seen on axial imaging extending from the cecal tip,
draping across the uterine fundus in terminating towards the right
pelvic sidewall (sequence 8 image [id number] for example). There is
however some very mild mural thickening of the terminal ileum cecum
about the ileocecal valve with slightly increased T2 signal which
could reflect some edematous changes. No other abnormal bowel wall
thickening or signal. Bowel protrudes into a ventral rectus
diastasis without evidence of obstruction.

Vascular/Lymphatic: No pathologically enlarged lymph nodes
identified. No abdominal aortic aneurysm demonstrated.

Reproductive: Anteverted, gravid uterus with fetal pole. Probable
corpus luteum in the right ovary. No concerning adnexal masses.

Other: No abdominopelvic free fluid. Ventral rectus diastasis, as
above. No bowel containing hernias.

Musculoskeletal: Normal marrow signal for patient age. No visible
abnormality in the spinal canal.
IMPRESSION: Gravid patient with visible gestational sac and fetal pole. Probable
corpus luteum in the right adnexa.

Normal appendix in the right lower quadrant.

Mild mural thickening of the terminal ileum and cecum with increased
T2 signal, could reflect an ileocolitis of infectious or
inflammatory etiology.

Small ventral rectus diastasis with closely apposed nonobstructed
bowel loops.

Suspect tiny calculi present in the lower pole right kidney.

These results were called by telephone at the time of interpretation
on [DATE] at [DATE] to provider [HOSPITAL] [facility] , who verbally
acknowledged these results.

## 2020-07-28 IMAGING — MR MR ABDOMEN W/O CM
11 of 19 series · 29 of 48 positions shown · non-contrast
Comparison: None.

CLINICAL DATA: Ten weeks pregnant, recent [I0] diagnosis,
persistent emesis, right lower quadrant pain

EXAM:
MRI ABDOMEN AND PELVIS WITHOUT CONTRAST
TECHNIQUE: Multiplanar multisequence MR imaging of the abdomen and pelvis was
performed. No intravenous contrast was administered.

[Series 4: cor haste · coronal · 5.0mm · 1.25mm/px · 1 of 40 slices shown]
[im 1/40]
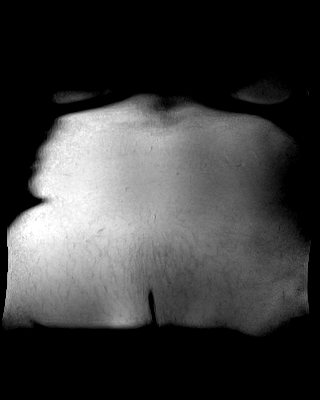

[Series 5: cor haste fs · coronal · 5.0mm · 1.25mm/px · 1 of 40 slices shown]
[im 1/40]
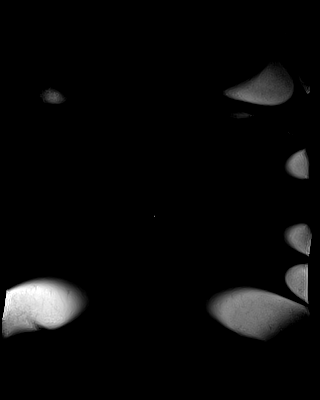

[Series 6: bSSFP · coronal · 5.0mm · 0.78mm/px · 1 of 40 slices shown (1 of 3)]
[im 1/40]
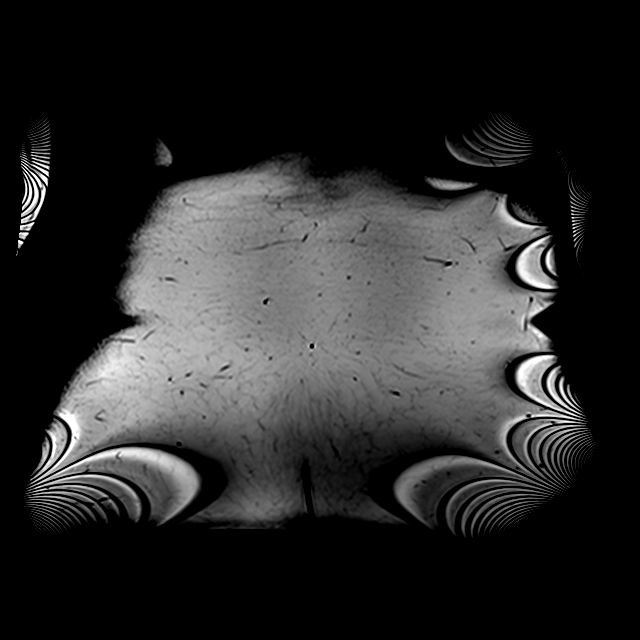

[Series 7: T2 · axial · 4.0mm · 1.19mm/px · z∈[-5,+254]mm · 2 of 55 slices shown (1 of 4)]
[im 1/55]
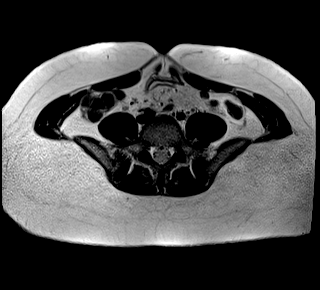
[im 55/55]
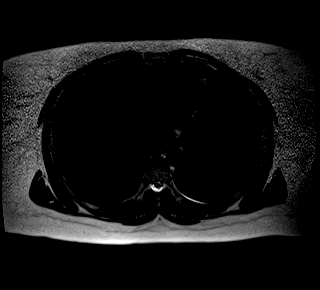

[Series 8: T2 · axial · 4.0mm · 1.19mm/px · z∈[-259,-0]mm · 3 of 55 slices shown (2 of 4)]
[im 1/55]
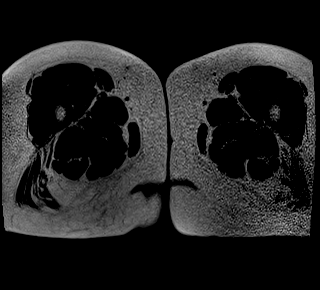
[im 28/55]
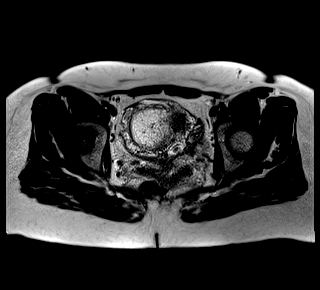
[im 55/55]
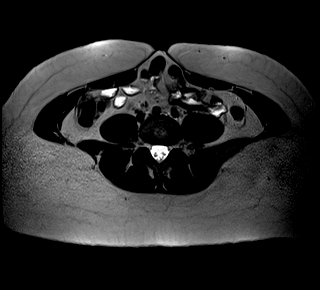

[Series 9: T2 · axial · 4.0mm · 1.19mm/px · z∈[-259,+254]mm · 5 of 108 slices shown (3 of 4)]
[im 1/108]
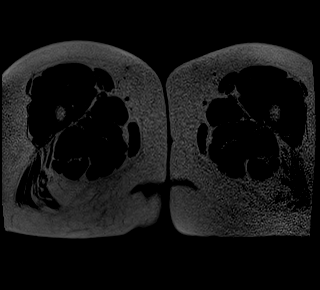
[im 27/108]
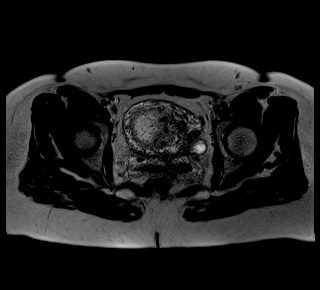
[im 54/108]
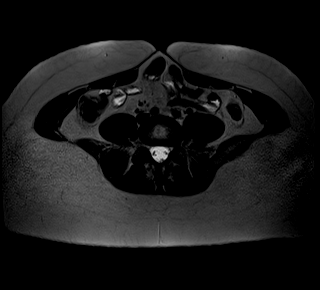
[im 81/108]
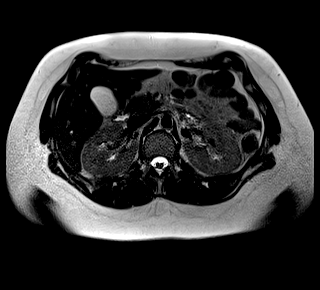
[im 108/108]
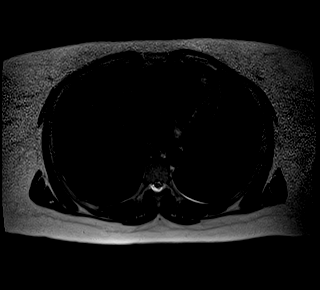

[Series 10: T2 fat-sat · axial · 4.0mm · 1.19mm/px · z∈[-5,+254]mm · 3 of 55 slices shown (1 of 2)]
[im 1/55]
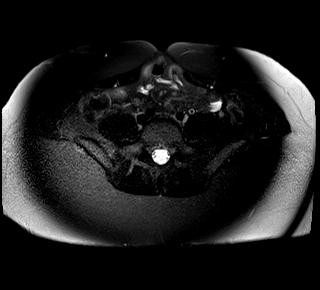
[im 28/55]
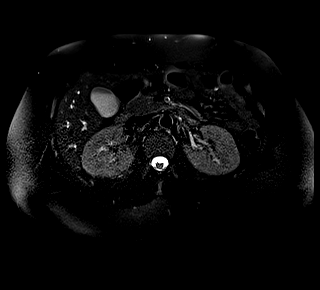
[im 55/55]
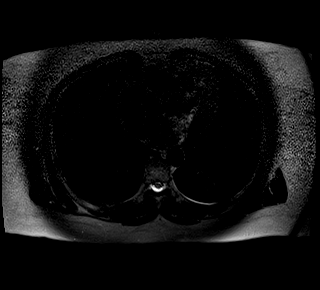

[Series 11: T2 fat-sat · axial · 4.0mm · 1.19mm/px · z∈[-259,-0]mm · 3 of 55 slices shown (2 of 2)]
[im 1/55]
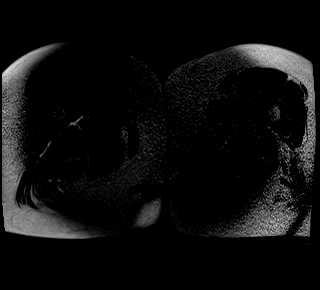
[im 28/55]
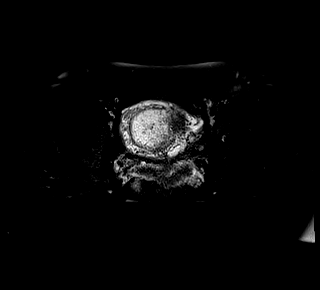
[im 55/55]
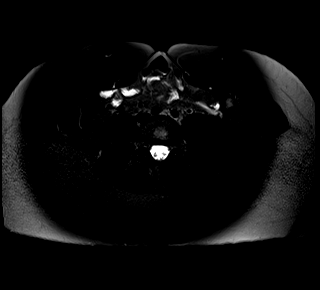

[Series 12: T2 · axial · 4.0mm · 1.19mm/px · z∈[-259,+254]mm · 5 of 108 slices shown (4 of 4)]
[im 1/108]
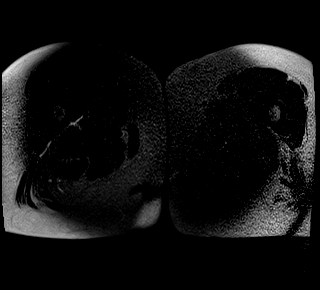
[im 27/108]
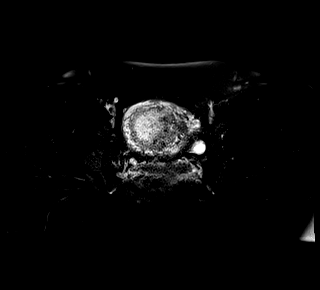
[im 54/108]
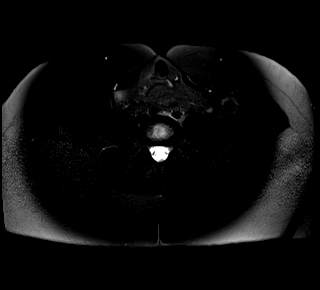
[im 81/108]
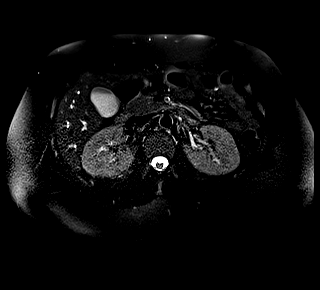
[im 108/108]
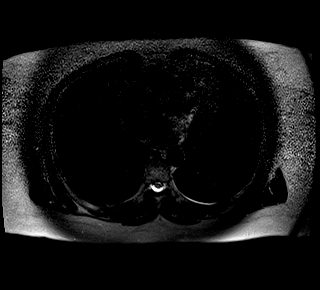

[Series 13: bSSFP · axial · 4.0mm · 0.59mm/px · z∈[-5,+254]mm · 3 of 55 slices shown (2 of 3)]
[im 1/55]
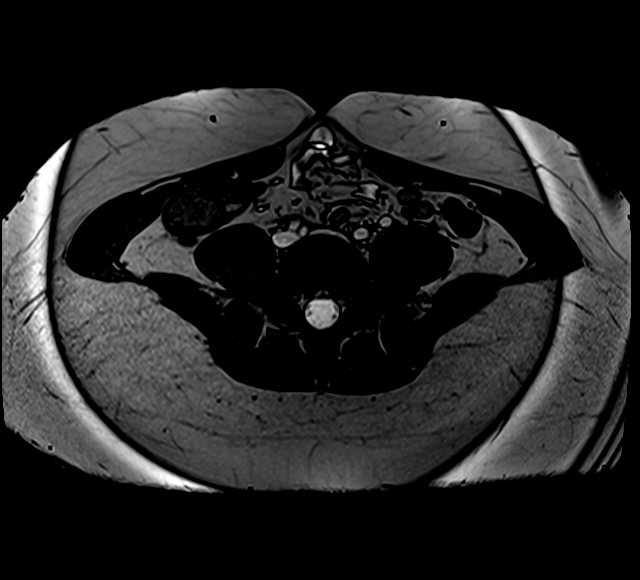
[im 28/55]
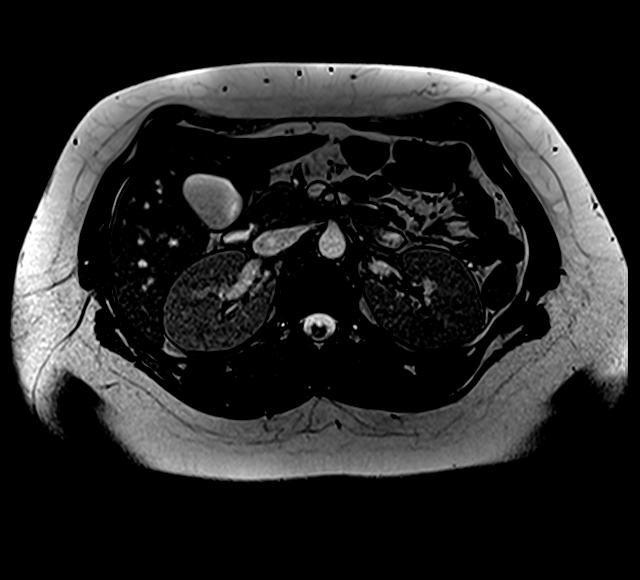
[im 55/55]
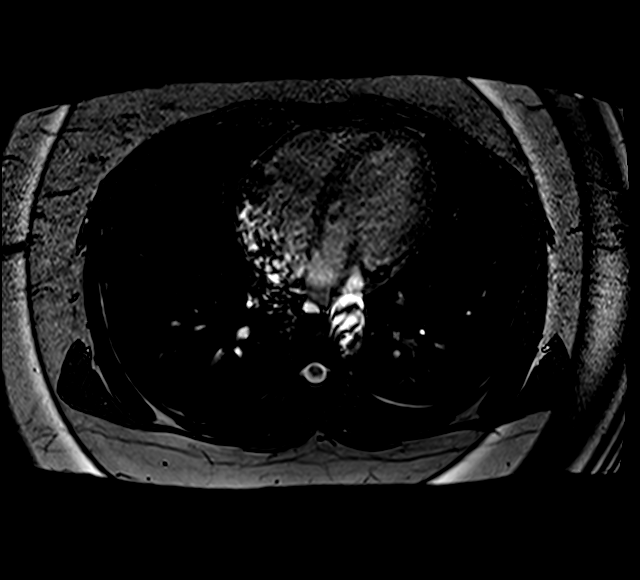

[Series 14: bSSFP · axial · 4.0mm · 0.59mm/px · z∈[-259,-130]mm · 2 of 55 slices shown (3 of 3)]
[im 1/55]
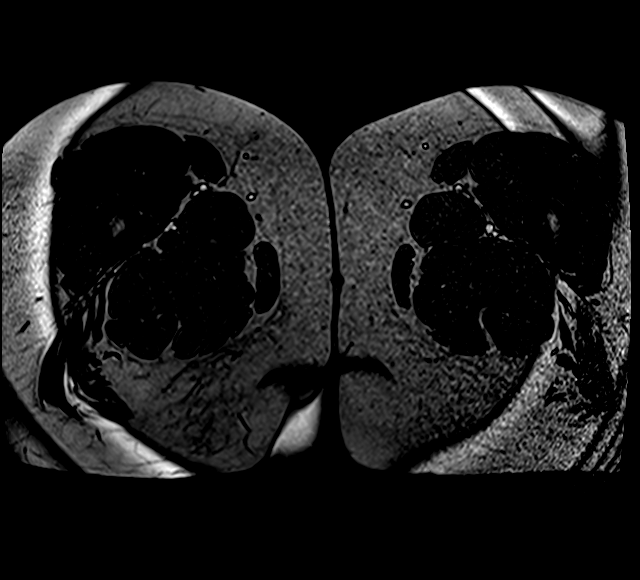
[im 28/55]
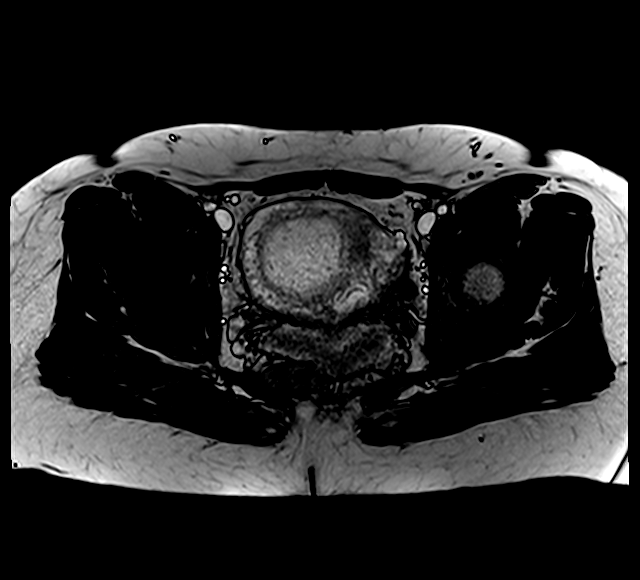

[29 of 48 positions shown; findings below may reference images not displayed]

FINDINGS: COMBINED FINDINGS FOR BOTH MR ABDOMEN AND PELVIS

Lower chest: Lung bases are clear. Normal heart size. No pericardial
effusion.

Hepatobiliary: Few tiny T2 hyperintense probable cysts without
concerning feature in the liver. No concerning focal liver lesion.
Smooth liver surface contour. Normal intrinsic liver signal. Normal
appearance of the gallbladder and biliary tree without visible
filling defects.

Pancreas: No mass, inflammatory changes, or other parenchymal
abnormality identified.

Spleen:  Normal in size. No concerning splenic lesions.

Adrenals/Urinary Tract: Normal adrenal glands. No renal masses. No
obstructive urolithiasis/hydronephrosis. Few punctate foci DA months
cracks gagging imminent and lung the LS to the sign in the right
lower pole could reflect tiny 2-3 mm nonobstructing calculi. Urinary
bladder is decompressed no abnormal bladder wall thickening or
debris.

Stomach/Bowel: A normal appendix is visualized in the right lower
quadrant best seen on axial imaging extending from the cecal tip,
draping across the uterine fundus in terminating towards the right
pelvic sidewall (sequence 8 image [id number] for example). There is
however some very mild mural thickening of the terminal ileum cecum
about the ileocecal valve with slightly increased T2 signal which
could reflect some edematous changes. No other abnormal bowel wall
thickening or signal. Bowel protrudes into a ventral rectus
diastasis without evidence of obstruction.

Vascular/Lymphatic: No pathologically enlarged lymph nodes
identified. No abdominal aortic aneurysm demonstrated.

Reproductive: Anteverted, gravid uterus with fetal pole. Probable
corpus luteum in the right ovary. No concerning adnexal masses.

Other: No abdominopelvic free fluid. Ventral rectus diastasis, as
above. No bowel containing hernias.

Musculoskeletal: Normal marrow signal for patient age. No visible
abnormality in the spinal canal.
IMPRESSION: Gravid patient with visible gestational sac and fetal pole. Probable
corpus luteum in the right adnexa.

Normal appendix in the right lower quadrant.

Mild mural thickening of the terminal ileum and cecum with increased
T2 signal, could reflect an ileocolitis of infectious or
inflammatory etiology.

Small ventral rectus diastasis with closely apposed nonobstructed
bowel loops.

Suspect tiny calculi present in the lower pole right kidney.

These results were called by telephone at the time of interpretation
on [DATE] at [DATE] to provider [HOSPITAL] [facility] , who verbally
acknowledged these results.

## 2020-07-28 MED ORDER — AMOXICILLIN-POT CLAVULANATE 875-125 MG PO TABS
1.0000 | ORAL_TABLET | Freq: Once | ORAL | Status: AC
  Administered 2020-07-28: 1 via ORAL
  Filled 2020-07-28: qty 1

## 2020-07-28 MED ORDER — METOCLOPRAMIDE HCL 10 MG PO TABS: 10.0000 mg | ORAL_TABLET | Freq: Four times a day (QID) | ORAL | 0 refills | Status: DC | PRN

## 2020-07-28 MED ORDER — AMOXICILLIN-POT CLAVULANATE 875-125 MG PO TABS: 1.0000 | ORAL_TABLET | Freq: Two times a day (BID) | ORAL | 0 refills | Status: AC

## 2020-07-28 MED ORDER — TRAMADOL HCL 50 MG PO TABS: 100.0000 mg | ORAL_TABLET | Freq: Four times a day (QID) | ORAL | 0 refills | Status: DC | PRN

## 2020-07-28 MED ORDER — METRONIDAZOLE 500 MG PO TABS: 500.0000 mg | ORAL_TABLET | Freq: Three times a day (TID) | ORAL | 0 refills | Status: AC

## 2020-07-28 MED ORDER — FAMOTIDINE 20 MG PO TABS: 20.0000 mg | ORAL_TABLET | Freq: Two times a day (BID) | ORAL | 3 refills | Status: DC

## 2020-07-28 MED ORDER — ONDANSETRON 4 MG PO TBDP: 4.0000 mg | ORAL_TABLET | Freq: Three times a day (TID) | ORAL | 0 refills | Status: DC | PRN

## 2020-07-28 MED ORDER — METRONIDAZOLE 500 MG PO TABS
500.0000 mg | ORAL_TABLET | Freq: Once | ORAL | Status: AC
  Administered 2020-07-28: 500 mg via ORAL
  Filled 2020-07-28: qty 1

## 2020-07-28 NOTE — Progress Notes (Signed)
Virtual Visit via Telephone Note  I connected with Brittany [name] on 07/28/20 at 11:00 AM EDT by telephone and verified that I am speaking with the correct person using two identifiers.  Location:  Patient: Brittany [name] (home)   Provider: Serafina Royals, CNM ([facility], [facility])   I discussed the limitations, risks, security and privacy concerns of performing an evaluation and management service by telephone and the availability of in person appointments. I also discussed with the patient that there may be a patient responsible charge related to this service. The patient expressed understanding and agreed to proceed.   History of Present Illness:  Patient seen in ER last night and diagnosed with Ileocolitis; for further details, please see notes.   Endorses right sided abdominal pain (10/10) with nausea with vomiting every time she "drinks something". Reports episodes of stress incontinence with vomiting.   Denies rectal bleeding, chest pain, difficulty breathing or respiratory distress.   Observations/Objective:  CLINICAL DATA:  Ten weeks pregnant, recent COVID-19 diagnosis, persistent emesis, right lower quadrant pain   EXAM: MRI ABDOMEN AND PELVIS WITHOUT CONTRAST   TECHNIQUE: Multiplanar multisequence MR imaging of the abdomen and pelvis was performed. No intravenous contrast was administered.   COMPARISON:  None.   FINDINGS: COMBINED FINDINGS FOR BOTH MR ABDOMEN AND PELVIS   Lower chest: Lung bases are clear. Normal heart size. No pericardial effusion.   Hepatobiliary: Few tiny T2 hyperintense probable cysts without concerning feature in the liver. No concerning focal liver lesion. Smooth liver surface contour. Normal intrinsic liver signal. Normal appearance of the gallbladder and biliary tree without visible filling defects.   Pancreas: No mass, inflammatory changes, or other parenchymal abnormality identified.   Spleen:  Normal in  size. No concerning splenic lesions.   Adrenals/Urinary Tract: Normal adrenal glands. No renal masses. No obstructive urolithiasis/hydronephrosis. Few punctate foci DA months cracks gagging imminent and lung the LS to the sign in the right lower pole could reflect tiny 2-3 mm nonobstructing calculi. Urinary bladder is decompressed no abnormal bladder wall thickening or debris.   Stomach/Bowel: A normal appendix is visualized in the right lower quadrant best seen on axial imaging extending from the cecal tip, draping across the uterine fundus in terminating towards the right pelvic sidewall (sequence 8 image [id number] for example). There is however some very mild mural thickening of the terminal ileum cecum about the ileocecal valve with slightly increased T2 signal which could reflect some edematous changes. No other abnormal bowel wall thickening or signal. Bowel protrudes into a ventral rectus diastasis without evidence of obstruction.   Vascular/Lymphatic: No pathologically enlarged lymph nodes identified. No abdominal aortic aneurysm demonstrated.   Reproductive: Anteverted, gravid uterus with fetal pole. Probable corpus luteum in the right ovary. No concerning adnexal masses.   Other: No abdominopelvic free fluid. Ventral rectus diastasis, as above. No bowel containing hernias.   Musculoskeletal: Normal marrow signal for patient age. No visible abnormality in the spinal canal.   IMPRESSION: Gravid patient with visible gestational sac and fetal pole. Probable corpus luteum in the right adnexa.   Normal appendix in the right lower quadrant.   Mild mural thickening of the terminal ileum and cecum with increased T2 signal, could reflect an ileocolitis of infectious or inflammatory etiology.   Small ventral rectus diastasis with closely apposed nonobstructed bowel loops.   Suspect tiny calculi present in the lower pole right kidney.   These results were called by telephone  at the time of interpretation on  07/28/2020 at 2:15 am to provider [facility] , who verbally acknowledged these results.     Electronically Signed   By: Kreg Shropshire M.D.   On: 07/28/2020 02:26  Assessment:  1. Ileocolitis - Ambulatory referral to Gastroenterology  2. [redacted] weeks gestation of pregnancy - Ambulatory referral to Gastroenterology  3. Nausea and vomiting, intractability of vomiting not specified, unspecified vomiting type - Ambulatory referral to Gastroenterology  4. Right sided abdominal pain - Ambulatory referral to Gastroenterology   Plan:  Referral to GI, see orders.   Rx Pepcid, Reglan, and Tramadol, see orders.   Reviewed red flag symptoms and when to call.   RTC as previously scheduled or sooner if needed.   Follow Up Instructions:    I discussed the assessment and treatment plan with the patient. The patient was provided an opportunity to ask questions and all were answered. The patient agreed with the plan and demonstrated an understanding of the instructions.   The patient was advised to call back or seek an in-person evaluation if the symptoms worsen or if the condition fails to improve as anticipated.  I provided 8 minutes of non-face-to-face time during this encounter.   Serafina Royals, CNM [facility], [facility] 07/28/20 11:38 AM

## 2020-07-28 NOTE — ED Provider Notes (Signed)
[facility] Emergency Department Provider Note  ____________________________________________  Time seen: Approximately 1:53 AM  I have reviewed the triage vital signs and the nursing notes.   HISTORY  Chief Complaint Vomiting and Covid Positive   HPI Brittany [name] is a 30 y.o. female G8P5A2 currently at [redacted] weeks GA who presents for evaluation of vomiting and abdominal pain. Patient reports that she was diagnosed with covid last week. Had a few days of fever. Was better for a full day and 2 days ago started to have nausea, vomiting, and abdominal pain.  She has been unable to keep anything down today.  She reports nausea throughout this pregnancy but was not vomiting until now.  She denies cough or congestion, chest pain or shortness of breath, diarrhea or constipation, dysuria or hematuria, vaginal discharge or vaginal bleeding.  She has established care for this pregnancy already.  She describes right lower quadrant stabbing/sharp constant abdominal pain for the last 3 days.  She denies any prior abdominal surgeries.  Past Medical History:  Diagnosis Date   Asthma    Chlamydia    Gonorrhea    Hx MRSA infection 2005   NVD (normal vaginal delivery) 08/30/2010   Trichimoniasis     Patient Active Problem List   Diagnosis Date Noted   PROM (premature rupture of membranes) 05/18/2019   History of postpartum hemorrhage 05/18/2019   Postpartum hemorrhage 05/18/2019   Irregular uterine contractions 05/13/2019   Supervision of normal pregnancy 05/13/2019   Anemia of pregnancy in third trimester 03/20/2019   History of gestational diabetes in prior pregnancy, currently pregnant in second trimester 01/31/2019   Carpal tunnel syndrome during pregnancy 01/31/2019   Insufficient prenatal care in second trimester 01/31/2019   Peritonsillar abscess 02/25/2018   Asthma 02/25/2018   Status post normal vaginal delivery 09/05/2013   Other specified indication for  care or intervention related to labor and delivery, unspecified as to episode of care 09/03/2013   Pregnant 12/01/2011   Elevated blood pressure complicating pregnancy, antepartum 12/01/2011    Past Surgical History:  Procedure Laterality Date   MULTIPLE TOOTH EXTRACTIONS      Prior to Admission medications   Medication Sig Start Date End Date Taking? Authorizing Provider  amoxicillin-clavulanate (AUGMENTIN) 875-125 MG tablet Take 1 tablet by mouth 2 (two) times daily for 10 days. 07/28/20 08/07/20 Yes Issaiah Seabrooks, [place], [place]  metroNIDAZOLE (FLAGYL) 500 MG tablet Take 1 tablet (500 mg total) by mouth 3 (three) times daily for 10 days. 07/28/20 08/07/20 Yes Allis Quirarte, [place], [place]  ondansetron (ZOFRAN ODT) 4 MG disintegrating tablet Take 1 tablet (4 mg total) by mouth every 8 (eight) hours as needed. 07/28/20  Yes Don Perking, [place], [place]  albuterol (PROVENTIL HFA;VENTOLIN HFA) 108 (90 Base) MCG/ACT inhaler Inhale 1-2 puffs into the lungs every 6 (six) hours as needed for wheezing or shortness of breath. Patient not taking: No sig reported    [provider]  albuterol (VENTOLIN HFA) 108 (90 Base) MCG/ACT inhaler Inhale 2 puffs into the lungs every 4 (four) hours as needed for wheezing or shortness of breath. 07/10/20   Doreene Burke, CNM  Doxylamine-Pyridoxine 10-10 MG TBEC Take 1 tablet by mouth 4 (four) times daily. Day 1 &2: 2 tablet at bedtimeDay 3 : if symptoms persists 1 tablet am; 2 tablet at bedtimeDay 4: 1 tablet am, 1 tab afternoon, 2 tab at bedtime 06/25/20   Doreene Burke, CNM  fluticasone (FLOVENT HFA) 110 MCG/ACT inhaler Inhale 1 puff into the lungs  2 (two) times daily. 07/10/20 09/08/20  Doreene Burkehompson, Annie, CNM  ondansetron (ZOFRAN) 4 MG tablet Take 1 tablet (4 mg total) by mouth every 8 (eight) hours as needed for nausea or vomiting. 07/10/20   Doreene Burkehompson, [place], CNM  Prenatal Multivit-Min-Fe-FA (PRE-NATAL PO) Take by mouth.    [provider]  progesterone (PROMETRIUM) 200  MG capsule PLACE 2 CAPSULES (400 MG TOTAL) VAGINALLY IN THE MORNING AND AT BEDTIME. 07/23/20   Doreene Burkehompson, Annie, CNM    Allergies Iodine and Sulfonamide derivatives  Family History  Problem Relation Age of Onset   Diabetes Maternal Grandmother    Hypertension Father    Heart disease Father    Lung disease Father    Healthy Mother    Anesthesia problems Neg Hx    Other Neg Hx     Social History Social History   Tobacco Use   Smoking status: Former    Packs/day: 0.25    Years: 5.00    Pack years: 1.25    Types: Cigarettes    Quit date: 12/27/2012    Years since quitting: 7.5   Smokeless tobacco: Never  Vaping Use   Vaping Use: Never used  Substance Use Topics   Alcohol use: No   Drug use: Not Currently    Types: Marijuana    Review of Systems  Constitutional: Negative for fever. Eyes: Negative for visual changes. ENT: Negative for sore throat. Neck: No neck pain  Cardiovascular: Negative for chest pain. Respiratory: Negative for shortness of breath. Gastrointestinal: + abdominal pain, nausea, and vomiting. No diarrhea. Genitourinary: Negative for dysuria. Musculoskeletal: Negative for back pain. Skin: Negative for rash. Neurological: Negative for headaches, weakness or numbness. Psych: No SI or HI  ____________________________________________   PHYSICAL EXAM:  VITAL SIGNS: ED Triage Vitals [07/27/20 2256]  Enc Vitals Group     BP 117/90     Pulse Rate 79     Resp 20     Temp 98.2 F (36.8 C)     Temp src      SpO2 100 %     Weight 219 lb (99.3 kg)     Height 5\' 5"  (1.651 m)     Head Circumference      Peak Flow      Pain Score 7     Pain Loc      Pain Edu?      Excl. in GC?     Constitutional: Alert and oriented. Well appearing and in no apparent distress. HEENT:      Head: Normocephalic and atraumatic.         Eyes: Conjunctivae are normal. Sclera is non-icteric.       Mouth/Throat: Mucous membranes are moist.       Neck: Supple with no  signs of meningismus. Cardiovascular: Regular rate and rhythm. No murmurs, gallops, or rubs. 2+ symmetrical distal pulses are present in all extremities. No JVD. Respiratory: Normal respiratory effort. Lungs are clear to auscultation bilaterally.  Gastrointestinal: Soft, tender to palpation on the RLQ, and non distended with positive bowel sounds. No rebound or guarding. Genitourinary: No CVA tenderness. Musculoskeletal:  No edema, cyanosis, or erythema of extremities. Neurologic: Normal speech and language. Face is symmetric. Moving all extremities. No gross focal neurologic deficits are appreciated. Skin: Skin is warm, dry and intact. No rash noted. Psychiatric: Mood and affect are normal. Speech and behavior are normal.  ____________________________________________   LABS (all labs ordered are listed, but only abnormal results are displayed)  Labs Reviewed  COMPREHENSIVE METABOLIC PANEL - Abnormal; Notable for the following components:      Result Value   Potassium 3.3 (*)    Glucose, Bld 104 (*)    All other components within normal limits  URINALYSIS, COMPLETE (UACMP) WITH MICROSCOPIC - Abnormal; Notable for the following components:   Color, Urine AMBER (*)    APPearance HAZY (*)    Ketones, ur 5 (*)    Protein, ur 30 (*)    All other components within normal limits  CBC WITH DIFFERENTIAL/PLATELET - Abnormal; Notable for the following components:   MCV 79.2 (*)    All other components within normal limits  LIPASE, BLOOD  MAGNESIUM   ____________________________________________  EKG  none  ____________________________________________  RADIOLOGY  I have personally reviewed the images performed during this visit and I agree with the Radiologist's read.   Interpretation by Radiologist:  MR PELVIS WO CONTRAST  Result Date: 07/28/2020 CLINICAL DATA:  Ten weeks pregnant, recent COVID-19 diagnosis, persistent emesis, right lower quadrant pain EXAM: MRI ABDOMEN AND PELVIS  WITHOUT CONTRAST TECHNIQUE: Multiplanar multisequence MR imaging of the abdomen and pelvis was performed. No intravenous contrast was administered. COMPARISON:  None. FINDINGS: COMBINED FINDINGS FOR BOTH MR ABDOMEN AND PELVIS Lower chest: Lung bases are clear. Normal heart size. No pericardial effusion. Hepatobiliary: Few tiny T2 hyperintense probable cysts without concerning feature in the liver. No concerning focal liver lesion. Smooth liver surface contour. Normal intrinsic liver signal. Normal appearance of the gallbladder and biliary tree without visible filling defects. Pancreas: No mass, inflammatory changes, or other parenchymal abnormality identified. Spleen:  Normal in size. No concerning splenic lesions. Adrenals/Urinary Tract: Normal adrenal glands. No renal masses. No obstructive urolithiasis/hydronephrosis. Few punctate foci DA months cracks gagging imminent and lung the LS to the sign in the right lower pole could reflect tiny 2-3 mm nonobstructing calculi. Urinary bladder is decompressed no abnormal bladder wall thickening or debris. Stomach/Bowel: A normal appendix is visualized in the right lower quadrant best seen on axial imaging extending from the cecal tip, draping across the uterine fundus in terminating towards the right pelvic sidewall (sequence 8 image [id number] for example). There is however some very mild mural thickening of the terminal ileum cecum about the ileocecal valve with slightly increased T2 signal which could reflect some edematous changes. No other abnormal bowel wall thickening or signal. Bowel protrudes into a ventral rectus diastasis without evidence of obstruction. Vascular/Lymphatic: No pathologically enlarged lymph nodes identified. No abdominal aortic aneurysm demonstrated. Reproductive: Anteverted, gravid uterus with fetal pole. Probable corpus luteum in the right ovary. No concerning adnexal masses. Other: No abdominopelvic free fluid. Ventral rectus diastasis, as above.  No bowel containing hernias. Musculoskeletal: Normal marrow signal for patient age. No visible abnormality in the spinal canal. IMPRESSION: Gravid patient with visible gestational sac and fetal pole. Probable corpus luteum in the right adnexa. Normal appendix in the right lower quadrant. Mild mural thickening of the terminal ileum and cecum with increased T2 signal, could reflect an ileocolitis of infectious or inflammatory etiology. Small ventral rectus diastasis with closely apposed nonobstructed bowel loops. Suspect tiny calculi present in the lower pole right kidney. These results were called by telephone at the time of interpretation on 07/28/2020 at 2:15 am to provider [facility] , who verbally acknowledged these results. Electronically Signed   By: Kreg Shropshire M.D.   On: 07/28/2020 02:26   MR ABDOMEN WO CONTRAST  Result Date: 07/28/2020 CLINICAL DATA:  Ten weeks pregnant, recent COVID-19 diagnosis, persistent  emesis, right lower quadrant pain EXAM: MRI ABDOMEN AND PELVIS WITHOUT CONTRAST TECHNIQUE: Multiplanar multisequence MR imaging of the abdomen and pelvis was performed. No intravenous contrast was administered. COMPARISON:  None. FINDINGS: COMBINED FINDINGS FOR BOTH MR ABDOMEN AND PELVIS Lower chest: Lung bases are clear. Normal heart size. No pericardial effusion. Hepatobiliary: Few tiny T2 hyperintense probable cysts without concerning feature in the liver. No concerning focal liver lesion. Smooth liver surface contour. Normal intrinsic liver signal. Normal appearance of the gallbladder and biliary tree without visible filling defects. Pancreas: No mass, inflammatory changes, or other parenchymal abnormality identified. Spleen:  Normal in size. No concerning splenic lesions. Adrenals/Urinary Tract: Normal adrenal glands. No renal masses. No obstructive urolithiasis/hydronephrosis. Few punctate foci DA months cracks gagging imminent and lung the LS to the sign in the right lower pole could  reflect tiny 2-3 mm nonobstructing calculi. Urinary bladder is decompressed no abnormal bladder wall thickening or debris. Stomach/Bowel: A normal appendix is visualized in the right lower quadrant best seen on axial imaging extending from the cecal tip, draping across the uterine fundus in terminating towards the right pelvic sidewall (sequence 8 image [id number] for example). There is however some very mild mural thickening of the terminal ileum cecum about the ileocecal valve with slightly increased T2 signal which could reflect some edematous changes. No other abnormal bowel wall thickening or signal. Bowel protrudes into a ventral rectus diastasis without evidence of obstruction. Vascular/Lymphatic: No pathologically enlarged lymph nodes identified. No abdominal aortic aneurysm demonstrated. Reproductive: Anteverted, gravid uterus with fetal pole. Probable corpus luteum in the right ovary. No concerning adnexal masses. Other: No abdominopelvic free fluid. Ventral rectus diastasis, as above. No bowel containing hernias. Musculoskeletal: Normal marrow signal for patient age. No visible abnormality in the spinal canal. IMPRESSION: Gravid patient with visible gestational sac and fetal pole. Probable corpus luteum in the right adnexa. Normal appendix in the right lower quadrant. Mild mural thickening of the terminal ileum and cecum with increased T2 signal, could reflect an ileocolitis of infectious or inflammatory etiology. Small ventral rectus diastasis with closely apposed nonobstructed bowel loops. Suspect tiny calculi present in the lower pole right kidney. These results were called by telephone at the time of interpretation on 07/28/2020 at 2:15 am to provider [facility] , who verbally acknowledged these results. Electronically Signed   By: Kreg Shropshire M.D.   On: 07/28/2020 02:26     ____________________________________________   PROCEDURES  Procedure(s) performed: None Procedures Critical Care  performed:  None ____________________________________________   INITIAL IMPRESSION / ASSESSMENT AND PLAN / ED COURSE  30 y.o. female G25P5A2 currently at [redacted] weeks GA who presents for evaluation of vomiting and abdominal pain.  Patient is well-appearing in no distress with normal vital signs.  She is tender to palpation on the right lower quadrant with no rebound or guarding.  No right upper quadrant tenderness.  Patient has established care for this pregnancy.  Had an ultrasound a week ago confirming intra uterine pregnancy.  Also had her STD screening done 4 weeks ago.  Denies any vaginal bleeding or vaginal discharge.  Differential diagnosis including hyperemesis versus appendicitis versus ovarian cyst versus ovarian torsion versus UTI versus heterotopic pregnancy versus round ligament pain  Labs showing minimal ketones concerning with mild dehydration possible hyperemesis.  Normal white count, no significant electrolyte derangements, no AKI, no leukocytosis.  Normal LFTs and lipase.  Patient was given Tylenol, IV Zofran and fluids.  Reports that her nausea is improved but has persistent right  lower quadrant abdominal pain therefore we will send patient for an MRI to rule out appendicitis.  Old medical records review including patient's visit to the OB and ultrasound done last week   _________________________ 2:38 AM on 07/28/2020 ----------------------------------------- Discussed with the radiologist over the phone.  MRI shows normal appendix but concerns of possible ileocecal colitis.  Patient was started on Augmentin and Flagyl and will be discharged home on a 10-day course of antibiotics.  Recommended close follow-up with her OB/GYN and return to the ED for worsening abdominal pain, fever or chills     _____________________________________________ Please note:  Patient was evaluated in Emergency Department today for the symptoms described in the history of present illness. Patient was  evaluated in the context of the global COVID-19 pandemic, which necessitated consideration that the patient might be at risk for infection with the SARS-CoV-2 virus that causes COVID-19. Institutional protocols and algorithms that pertain to the evaluation of patients at risk for COVID-19 are in a state of rapid change based on information released by regulatory bodies including the CDC and federal and state organizations. These policies and algorithms were followed during the patient's care in the ED.  Some ED evaluations and interventions may be delayed as a result of limited staffing during the pandemic.   [place] Controlled Substance Database was reviewed by me. ____________________________________________   FINAL CLINICAL IMPRESSION(S) / ED DIAGNOSES   Final diagnoses:  Ileocolitis      NEW MEDICATIONS STARTED DURING THIS VISIT:  ED Discharge Orders          Ordered    ondansetron (ZOFRAN ODT) 4 MG disintegrating tablet  Every 8 hours PRN        07/28/20 0238    amoxicillin-clavulanate (AUGMENTIN) 875-125 MG tablet  2 times daily        07/28/20 0238    metroNIDAZOLE (FLAGYL) 500 MG tablet  3 times daily        07/28/20 0238             Note:  This document was prepared using Dragon voice recognition software and may include unintentional dictation errors.    Don Perking, [place], MD 07/28/20 [phone]

## 2020-07-28 NOTE — ED Notes (Signed)
Patient taken to MRI

## 2020-07-29 ENCOUNTER — Encounter: Payer: Self-pay | Admitting: *Deleted

## 2020-07-29 ENCOUNTER — Telehealth: Payer: Self-pay | Admitting: Certified Nurse Midwife

## 2020-07-29 ENCOUNTER — Other Ambulatory Visit: Payer: Self-pay | Admitting: Certified Nurse Midwife

## 2020-07-29 DIAGNOSIS — K529 Noninfective gastroenteritis and colitis, unspecified: Secondary | ICD-10-CM

## 2020-07-29 DIAGNOSIS — Z3A09 9 weeks gestation of pregnancy: Secondary | ICD-10-CM

## 2020-07-29 DIAGNOSIS — R112 Nausea with vomiting, unspecified: Secondary | ICD-10-CM

## 2020-07-29 MED ORDER — PROMETHAZINE HCL 25 MG RE SUPP: 25.0000 mg | Freq: Four times a day (QID) | RECTAL | 0 refills | Status: DC | PRN

## 2020-07-29 NOTE — Progress Notes (Signed)
Rx Phenergan suppository, see orders.    Serafina Royals, CNM [facility], [facility] 07/29/20 2:07 PM

## 2020-07-29 NOTE — Telephone Encounter (Signed)
Brittany [name] called and states that the pain meds make her sleepy but they are helping.  Brittany [name] states she is still throwing up and the nausea medicine is not helping.  Patient states she hasn't eaten or drank anything in 5 days and now she is throwing up stomach acid.  Patient states she wants some relief and stated that Marcelino Duster told her to reach out today if nothing is helping because she is on call and will help her.  Please advise.

## 2020-07-29 NOTE — Telephone Encounter (Signed)
Called patient. No answer. Left voice message in regards to her medication being sent to pharmacy. Make sure she is taking Pepcid, Reglan and Zofran as well. Return to ER if symptoms persist and rehydration needed. Per JML.

## 2020-07-29 NOTE — Telephone Encounter (Signed)
Rx Phenergan suppositories sent pharmacy on file, see chart. Make sure she is taking Pepcid, Reglan and Zofran as well. Return to ER if symptoms persist and rehydration needed. Thanks, JML

## 2020-07-30 NOTE — Telephone Encounter (Signed)
Reached out to patient again today to make sure she got my vm yesterday. No answer, lvm.

## 2020-08-04 ENCOUNTER — Other Ambulatory Visit: Payer: Self-pay | Admitting: Certified Nurse Midwife

## 2020-08-04 ENCOUNTER — Telehealth: Payer: Self-pay | Admitting: Certified Nurse Midwife

## 2020-08-04 NOTE — Telephone Encounter (Signed)
Patient called asking if she was clear to come to her apt this week- she was dx with covid on 06-9th, states that she has negative covid test (at home test and a rapid) since. Pt states no symptoms. Please Advise.

## 2020-08-12 ENCOUNTER — Other Ambulatory Visit: Payer: Self-pay

## 2020-08-12 ENCOUNTER — Ambulatory Visit (INDEPENDENT_AMBULATORY_CARE_PROVIDER_SITE_OTHER): Payer: Medicaid Other | Admitting: Certified Nurse Midwife

## 2020-08-12 VITALS — BP 123/81 | HR 93 | Wt 211.0 lb

## 2020-08-12 DIAGNOSIS — Z3A11 11 weeks gestation of pregnancy: Secondary | ICD-10-CM | POA: Diagnosis not present

## 2020-08-12 DIAGNOSIS — Z3481 Encounter for supervision of other normal pregnancy, first trimester: Secondary | ICD-10-CM

## 2020-08-12 LAB — POCT URINALYSIS DIPSTICK
Bilirubin, UA: NEGATIVE
Blood, UA: NEGATIVE
Glucose, UA: NEGATIVE
Ketones, UA: NEGATIVE
Leukocytes, UA: NEGATIVE
Nitrite, UA: NEGATIVE
Protein, UA: NEGATIVE
Spec Grav, UA: 1.01 (ref 1.010–1.025)
Urobilinogen, UA: 0.2 E.U./dL
pH, UA: 7 (ref 5.0–8.0)

## 2020-08-12 MED ORDER — ASPIRIN EC 81 MG PO TBEC
81.0000 mg | DELAYED_RELEASE_TABLET | Freq: Every day | ORAL | 11 refills | Status: DC
Start: 2020-08-12 — End: 2021-02-14

## 2020-08-12 NOTE — Patient Instructions (Signed)
https://www.[facility].org/womens-health/faqs/[email or web address]-[email or web address]-[email or web address]  Prenatal Care Prenatal care is health care during pregnancy. It helps you and your unborn baby (fetus) stay as healthy as possible. Prenatal care may be provided by a midwife, a family practice doctor, a mid-level practitioner (nurse practitioner or physician assistant), or a childbirth and pregnancy doctor (obstetrician). How does this affect me? During pregnancy, you will be closely monitored for any new conditions that might develop. To lower your risk of pregnancy complications, you and yourhealth care provider will talk about any underlying conditions you have. How does this affect my baby? Early and consistent prenatal care increases the chance that your baby will be healthy during pregnancy. Prenatal care lowers the risk that your baby will be: Born early (prematurely). Smaller than expected at birth (small for gestational age). What can I expect at the first prenatal care visit? Your first prenatal care visit will likely be the longest. You should schedule your first prenatal care visit as soon as you know that you are pregnant. Your first visit is a good time to talk about any questions or concerns you haveabout pregnancy. Medical history At your visit, you and your health care provider will talk about your medical history, including: Any past pregnancies. Your family's medical history. Medical history of the baby's father. Any long-term (chronic) health conditions you have and how you manage them. Any surgeries or procedures you have had. Any current over-the-counter or prescription medicines, herbs, or supplements that you are taking. Other factors that could pose a risk to your baby, including: Exposure to harmful chemicals or radiation at work or at home. Any substance use, including tobacco, alcohol, and drug use. Your home setting and your stress levels, including: Exposure to abuse or  violence. Household financial strain. Your daily health habits, including diet and exercise. Tests and screenings Your health care provider will: Measure your weight, height, and blood pressure. Do a physical exam, including a pelvic and breast exam. Perform blood tests and urine tests to check for: Urinary tract infection. Sexually transmitted infections (STIs). Low iron levels in your blood (anemia). Blood type and certain proteins on red blood cells (Rh antibodies). Infections and immunity to viruses, such as hepatitis B and rubella. HIV (human immunodeficiency virus). Discuss your options for genetic screening. Tips about staying healthy Your health care provider will also give you information about how to keep yourself and your baby healthy, including: Nutrition and taking vitamins. Physical activity. How to manage pregnancy symptoms such as nausea and vomiting (morning sickness). Infections and substances that may be harmful to your baby and how to avoid them. Food safety. Dental care. Working. Travel. Warning signs to watch for and when to call your health care provider. How often will I have prenatal care visits? After your first prenatal care visit, you will have regular visits throughout your pregnancy. The visit schedule is often as follows: Up to week 28 of pregnancy: once every 4 weeks. 28-36 weeks: once every 2 weeks. After 36 weeks: every week until delivery. Some women may have visits more or less often depending on any underlyinghealth conditions and the health of the baby. Keep all follow-up and prenatal care visits. This is important. What happens during routine prenatal care visits? Your health care provider will: Measure your weight and blood pressure. Check for fetal heart sounds. Measure the height of your uterus in your abdomen (fundal height). This may be measured starting around week 20 of pregnancy. Check the position of your baby inside your  uterus. Ask questions   about your diet, sleeping patterns, and whether you can feel the baby move. Review warning signs to watch for and signs of labor. Ask about any pregnancy symptoms you are having and how you are dealing with them. Symptoms may include: Headaches. Nausea and vomiting. Vaginal discharge. Swelling. Fatigue. Constipation. Changes in your vision. Feeling persistently sad or anxious. Any discomfort, including back or pelvic pain. Bleeding or spotting. Make a list of questions to ask your health care provider at your routinevisits. What tests might I have during prenatal care visits? You may have blood, urine, and imaging tests throughout your pregnancy, such as: Urine tests to check for glucose, protein, or signs of infection. Glucose tests to check for a form of diabetes that can develop during pregnancy (gestational diabetes mellitus). This is usually done around week 24 of pregnancy. Ultrasounds to check your baby's growth and development, to check for birth defects, and to check your baby's well-being. These can also help to decide when you should deliver your baby. A test to check for group B strep (GBS) infection. This is usually done around week 36 of pregnancy. Genetic testing. This may include blood, fluid, or tissue sampling, or imaging tests, such as an ultrasound. Some genetic tests are done during the first trimester and some are done during the second trimester. What else can I expect during prenatal care visits? Your health care provider may recommend getting certain vaccines during pregnancy. These may include: A yearly flu shot (annual influenza vaccine). This is especially important if you will be pregnant during flu season. Tdap (tetanus, diphtheria, pertussis) vaccine. Getting this vaccine during pregnancy can protect your baby from whooping cough (pertussis) after birth. This vaccine may be recommended between weeks 27 and 36 of pregnancy. A COVID-19  vaccine. Later in your pregnancy, your health care provider may give you information about: Childbirth and breastfeeding classes. Choosing a health care provider for your baby. Umbilical cord banking. Breastfeeding. Birth control after your baby is born. The hospital labor and delivery unit and how to set up a tour. Registering at the hospital before you go into labor. Where to find more information Office on Women's Health: womenshealth.gov American Pregnancy Association: americanpregnancy.org March of Dimes: marchofdimes.org Summary Prenatal care helps you and your baby stay as healthy as possible during pregnancy. Your first prenatal care visit will most likely be the longest. You will have visits and tests throughout your pregnancy to monitor your health and your baby's health. Bring a list of questions to your visits to ask your health care provider. Make sure to keep all follow-up and prenatal care visits. This information is not intended to replace advice given to you by your health care provider. Make sure you discuss any questions you have with your healthcare provider. Document Revised: 11/15/2019 Document Reviewed: 11/15/2019 Elsevier Patient Education  2022 Elsevier Inc.  

## 2020-08-12 NOTE — Progress Notes (Signed)
NEW OB HISTORY AND PHYSICAL  SUBJECTIVE:       Brittany [name] is a 30 y.o. [phone] female, No LMP recorded (lmp unknown). Patient is pregnant., Estimated Date of Delivery: 02/28/21, [redacted]w[redacted]d, presents today for establishment of Prenatal Care. She has no unusual complaints . Has history of GDM-diet controlled previous pregnancy and gestational hypertension. Body mass index is 35.11 kg/m.   Social Single Living with her children Works at [employer] Exercise: none Smokes 2 cigarette a day, denies alcohol and drug use.   Gynecologic History No LMP recorded (lmp unknown). Patient is pregnant. Normal Contraception: none Last Pap: 11/22/2018. Results were: normal  Obstetric History OB History  Gravida Para Term Preterm AB Living  9 5 5   2 5   SAB IAB Ectopic Multiple Live Births  2     0 5    # Outcome Date GA Lbr Len/2nd Weight Sex Delivery Anes PTL Lv  9 Current           8 SAB 12/17/19          7 Term 05/18/19 [redacted]w[redacted]d / 00:14 7 lb 1.9 oz (3.23 kg) F Vag-Spont EPI  LIV  6 SAB 2020          5 Term 09/03/13 [redacted]w[redacted]d 02:13 / 00:22 6 lb 14.8 oz (3.141 kg) F Vag-Spont EPI  LIV  4 Term 12/07/11 [redacted]w[redacted]d / 00:04 6 lb 13.5 oz (3.104 kg) F Vag-Spont None  LIV  3 Term 08/30/10 [redacted]w[redacted]d 03:25 / 12:30 6 lb 13 oz (3.09 kg) M Vag-Spont None  LIV     Birth Comments: diabetic- oral meds  2 Term 06/16/07    M Vag-Spont   LIV  1 Gravida             Past Medical History:  Diagnosis Date   Asthma    Chlamydia    Gonorrhea    Hx MRSA infection 2005   NVD (normal vaginal delivery) 08/30/2010   Trichimoniasis     Past Surgical History:  Procedure Laterality Date   MULTIPLE TOOTH EXTRACTIONS      Current Outpatient Medications on File Prior to Visit  Medication Sig Dispense Refill   promethazine (PHENERGAN) 25 MG suppository Place 1 suppository (25 mg total) rectally every 6 (six) hours as needed for nausea or vomiting. 12 each 0   albuterol (PROVENTIL HFA;VENTOLIN HFA) 108 (90 Base) MCG/ACT  inhaler Inhale 1-2 puffs into the lungs every 6 (six) hours as needed for wheezing or shortness of breath.     albuterol (VENTOLIN HFA) 108 (90 Base) MCG/ACT inhaler Inhale 2 puffs into the lungs every 4 (four) hours as needed for wheezing or shortness of breath. 18 g 1   Doxylamine-Pyridoxine 10-10 MG TBEC Take 1 tablet by mouth 4 (four) times daily. Day 1 &2: 2 tablet at bedtimeDay 3 : if symptoms persists 1 tablet am; 2 tablet at bedtimeDay 4: 1 tablet am, 1 tab afternoon, 2 tab at bedtime 120 tablet 5   famotidine (PEPCID) 20 MG tablet Take 1 tablet (20 mg total) by mouth 2 (two) times daily. 60 tablet 3   metoCLOPramide (REGLAN) 10 MG tablet Take 1 tablet (10 mg total) by mouth every 6 (six) hours as needed for nausea. 20 tablet 0   ondansetron (ZOFRAN ODT) 4 MG disintegrating tablet Take 1 tablet (4 mg total) by mouth every 8 (eight) hours as needed. 20 tablet 0   Prenatal Multivit-Min-Fe-FA (PRE-NATAL PO) Take by mouth.     progesterone (  PROMETRIUM) 200 MG capsule PLACE 2 CAPSULES (400 MG TOTAL) VAGINALLY IN THE MORNING AND AT BEDTIME. 60 capsule 0   traMADol (ULTRAM) 50 MG tablet Take 2 tablets (100 mg total) by mouth every 6 (six) hours as needed for moderate pain or severe pain. 12 tablet 0   No current facility-administered medications on file prior to visit.    Allergies  Allergen Reactions   Iodine Other (See Comments)    blistering   Sulfonamide Derivatives Other (See Comments)    blistering    Social History   Socioeconomic History   Marital status: Single    Spouse name: Not on file   Number of children: Not on file   Years of education: Not on file   Highest education level: Not on file  Occupational History   Not on file  Tobacco Use   Smoking status: Former    Packs/day: 0.25    Years: 5.00    Pack years: 1.25    Types: Cigarettes    Quit date: 12/27/2012    Years since quitting: 7.6   Smokeless tobacco: Never  Vaping Use   Vaping Use: Never used   Substance and Sexual Activity   Alcohol use: No   Drug use: Not Currently    Types: Marijuana   Sexual activity: Yes    Birth control/protection: None    Comment: last intercourse 4 days ago  Other Topics Concern   Not on file  Social History Narrative   Not on file   Social Determinants of Health   Financial Resource Strain: Not on file  Food Insecurity: Not on file  Transportation Needs: Not on file  Physical Activity: Not on file  Stress: Not on file  Social Connections: Not on file  Intimate Partner Violence: Not on file    Family History  Problem Relation Age of Onset   Diabetes Maternal Grandmother    Hypertension Father    Heart disease Father    Lung disease Father    Healthy Mother    Anesthesia problems Neg Hx    Other Neg Hx     The following portions of the patient's history were reviewed and updated as appropriate: allergies, current medications, past OB history, past medical history, past surgical history, past family history, past social history, and problem list.    OBJECTIVE: Initial Physical Exam (New OB)  GENERAL APPEARANCE: alert, well appearing, in no apparent distress, oriented to person, place and time, overweight HEAD: normocephalic, atraumatic MOUTH: mucous membranes moist, pharynx normal without lesions THYROID: no thyromegaly or masses present BREASTS: no masses noted, no significant tenderness, no palpable axillary nodes, no skin changes LUNGS: clear to auscultation, no wheezes, rales or rhonchi, symmetric air entry HEART: regular rate and rhythm, no murmurs ABDOMEN: soft, nontender, nondistended, no abnormal masses, no epigastric pain and FHT present EXTREMITIES: no redness or tenderness in the calves or thighs, no edema, no limitation in range of motion, intact peripheral pulses SKIN: normal coloration and turgor, no rashes LYMPH NODES: no adenopathy palpable NEUROLOGIC: alert, oriented, normal speech, no focal findings or movement  disorder noted  PELVIC EXAM not indicated, tested pelvis  ASSESSMENT: Normal pregnancy  PLAN: New OB counseling: The patient has been given an overview regarding routine prenatal care. Recommendations regarding diet, weight gain, and exercise in pregnancy were given. Prenatal testing, optional genetic testing, carrier screening, and ultrasound use in pregnancy were reviewed. Benefits of Breast Feeding were discussed. The patient is encouraged to consider nursing her baby  post partum. Discussed use of baby aspirin at 16 wk. Orders placed. She verbalizes and agrees. Follow up 4 wk for ROB.   Doreene Burke, CNM

## 2020-08-16 LAB — MATERNIT 21 PLUS CORE, BLOOD
Fetal Fraction: 8
Result (T21): NEGATIVE
Trisomy 13 (Patau syndrome): NEGATIVE
Trisomy 18 (Edwards syndrome): NEGATIVE
Trisomy 21 (Down syndrome): NEGATIVE

## 2020-09-01 ENCOUNTER — Other Ambulatory Visit: Payer: Self-pay

## 2020-09-05 ENCOUNTER — Ambulatory Visit (INDEPENDENT_AMBULATORY_CARE_PROVIDER_SITE_OTHER): Payer: Medicaid Other | Admitting: Gastroenterology

## 2020-09-05 ENCOUNTER — Other Ambulatory Visit: Payer: Self-pay

## 2020-09-05 ENCOUNTER — Encounter: Payer: Self-pay | Admitting: Gastroenterology

## 2020-09-05 VITALS — BP 110/78 | HR 92 | Temp 98.2°F | Ht 65.0 in | Wt 215.5 lb

## 2020-09-05 DIAGNOSIS — K529 Noninfective gastroenteritis and colitis, unspecified: Secondary | ICD-10-CM

## 2020-09-05 NOTE — Progress Notes (Signed)
Brittany Repress, MD [address]  [address]  [place], [place] [address]  Main: [phone]  Fax: [phone]    Gastroenterology Consultation  Referring Provider:     Annye Asa* Primary Care Physician:  Patient, [name] (Inactive) Primary Gastroenterologist:  Dr. Arlyss [name] Reason for Consultation:     Right lower quadrant pain        HPI:   Brittany [name] is a 30 y.o. female referred by Dr. Patient, [name] (Inactive)  for consultation & management of right lower quadrant pain.  Patient went to [facility] ER on 07/27/2020 secondary to right lower quadrant pain associated with nausea, vomiting.  She was diagnosed with COVID.  Her labs were unrevealing except for mild microcytosis.  She underwent MRI abdomen and pelvis without contrast which revealed mild mural thickening of the terminal ileum and cecum.  Patient reports that her nausea vomiting have resolved.  She does not have diarrhea.  She does have intermittent right lower quadrant discomfort.  She denies any abdominal bloating.  She is currently 15 weeks of gestation.  This is her sixth pregnancy.  She reports that her previous pregnancies were all uneventful, had normal vaginal delivery.  Patient does smoke cigarettes She does not drink alcohol She works in [employer] She denies any family history of IBD, GI malignancy  NSAIDs: None  Antiplts/Anticoagulants/Anti thrombotics: None  GI Procedures: None  Past Medical History:  Diagnosis Date   Asthma    Chlamydia    Gonorrhea    Hx MRSA infection 2005   NVD (normal vaginal delivery) 08/30/2010   Trichimoniasis     Past Surgical History:  Procedure Laterality Date   MULTIPLE TOOTH EXTRACTIONS      Current Outpatient Medications:    albuterol (PROVENTIL HFA;VENTOLIN HFA) 108 (90 Base) MCG/ACT inhaler, Inhale 1-2 puffs into the lungs every 6 (six) hours as needed for wheezing or shortness of breath., Disp: , Rfl:    ondansetron (ZOFRAN)  4 MG tablet, Take by mouth., Disp: , Rfl:    Prenatal Multivit-Min-Fe-FA (PRE-NATAL PO), Take by mouth., Disp: , Rfl:    aspirin EC 81 MG tablet, Take 1 tablet (81 mg total) by mouth daily. Swallow whole. Start daily at [redacted] wks pregnant (Patient not taking: Reported on 09/05/2020), Disp: 30 tablet, Rfl: 11    Family History  Problem Relation Age of Onset   Diabetes Maternal Grandmother    Hypertension Father    Heart disease Father    Lung disease Father    Healthy Mother    Anesthesia problems Neg Hx    Other Neg Hx      Social History   Tobacco Use   Smoking status: Every Day    Packs/day: 0.25    Years: 5.00    Pack years: 1.25    Types: Cigarettes    Last attempt to quit: 12/27/2012    Years since quitting: 7.6   Smokeless tobacco: Never  Vaping Use   Vaping Use: Never used  Substance Use Topics   Alcohol use: No   Drug use: Not Currently    Types: Marijuana    Allergies as of 09/05/2020 - Review Complete 09/05/2020  Allergen Reaction Noted   Iodine Other (See Comments) 08/29/2010   Sulfonamide derivatives Other (See Comments) 08/30/2007    Review of Systems:    All systems reviewed and negative except where noted in HPI.   Physical Exam:  BP 110/78 (BP Location: Left Arm,  Patient Position: Sitting, Cuff Size: Normal)   Pulse 92   Temp 98.2 F (36.8 C) (Oral)   Ht 5\' 5"  (1.651 m)   Wt 215 lb 8 oz (97.8 kg)   LMP  (LMP Unknown)   BMI 35.86 kg/m  No LMP recorded (lmp unknown). Patient is pregnant.  General:   Alert,  Well-developed, well-nourished, pleasant and cooperative in NAD Head:  Normocephalic and atraumatic. Eyes:  Sclera clear, no icterus.   Conjunctiva pink. Ears:  Normal auditory acuity. Nose:  No deformity, discharge, or lesions. Mouth:  No deformity or lesions,oropharynx pink & moist. Neck:  Supple; no masses or thyromegaly. Lungs:  Respirations even and unlabored.  Clear throughout to auscultation.   No wheezes, crackles, or rhonchi. No  acute distress. Heart:  Regular rate and rhythm; no murmurs, clicks, rubs, or gallops. Abdomen:  Normal bowel sounds. Soft, mild right lower quadrant tenderness and non-distended without masses, hepatosplenomegaly or hernias noted.  No guarding or rebound tenderness.   Rectal: Not performed Msk:  Symmetrical without gross deformities. Good, equal movement & strength bilaterally. Pulses:  Normal pulses noted. Extremities:  No clubbing or edema.  No cyanosis. Neurologic:  Alert and oriented x3;  grossly normal neurologically. Skin:  Intact without significant lesions or rashes. No jaundice. Psych:  Alert and cooperative. Normal mood and affect.  Imaging Studies: Reviewed  Assessment and Plan:   Brittany [name] is a 30 y.o. African-American female with gravida 7, para 6, chronic tobacco use is seen in consultation for 1 month history of right lower quadrant pain and MRI revealing mild mural thickening of the terminal ileum and cecum.  Patient recently had COVID when she had GI symptoms.  10% of patients with COVID have GI symptoms.  However, due to persistent right lower quadrant pain, recommend to check fecal calprotectin levels as well as CRP.  If these are abnormal, recommend MR enterography.  We can also consider colonoscopy during her second trimester to rule out IBD if indicated based on above tests.  Encouraged to avoid red meat, processed foods  Follow up in 4 to 6 weeks   37, MD

## 2020-09-09 ENCOUNTER — Encounter: Payer: Medicaid Other | Admitting: Certified Nurse Midwife

## 2020-09-10 ENCOUNTER — Other Ambulatory Visit: Payer: Self-pay

## 2020-09-10 ENCOUNTER — Ambulatory Visit (INDEPENDENT_AMBULATORY_CARE_PROVIDER_SITE_OTHER): Payer: Medicaid Other | Admitting: Certified Nurse Midwife

## 2020-09-10 VITALS — BP 96/94 | HR 93 | Wt 214.3 lb

## 2020-09-10 DIAGNOSIS — Z3A15 15 weeks gestation of pregnancy: Secondary | ICD-10-CM

## 2020-09-10 DIAGNOSIS — Z3482 Encounter for supervision of other normal pregnancy, second trimester: Secondary | ICD-10-CM

## 2020-09-10 LAB — POCT URINALYSIS DIPSTICK OB
Bilirubin, UA: NEGATIVE
Blood, UA: NEGATIVE
Glucose, UA: NEGATIVE
Ketones, UA: NEGATIVE
Leukocytes, UA: NEGATIVE
Nitrite, UA: NEGATIVE
Spec Grav, UA: 1.02
Urobilinogen, UA: 0.2 U/dL
pH, UA: 6.5

## 2020-09-10 NOTE — Progress Notes (Signed)
Body mass index is 35.66 kg/m. ROB doing well. Starting to feel movement. Discussed anatomy scan . Orders placed. Reviewed MDs assisting with pt care until new midwife hired. She verbalizes and agree. Follow up 4 wks for ROB.   Doreene Burke, CNM

## 2020-09-11 ENCOUNTER — Other Ambulatory Visit: Payer: Self-pay | Admitting: Certified Nurse Midwife

## 2020-09-14 ENCOUNTER — Other Ambulatory Visit: Payer: Self-pay | Admitting: Certified Nurse Midwife

## 2020-09-15 ENCOUNTER — Other Ambulatory Visit: Payer: Self-pay | Admitting: Certified Nurse Midwife

## 2020-09-28 ENCOUNTER — Inpatient Hospital Stay (HOSPITAL_COMMUNITY)
Admission: AD | Admit: 2020-09-28 | Discharge: 2020-09-28 | Disposition: A | Discharge status: Home / Self Care | Payer: Medicaid Other | Attending: Family Medicine | Admitting: Family Medicine

## 2020-09-28 ENCOUNTER — Other Ambulatory Visit: Payer: Self-pay

## 2020-09-28 ENCOUNTER — Encounter (HOSPITAL_COMMUNITY): Payer: Self-pay | Admitting: Family Medicine

## 2020-09-28 DIAGNOSIS — O99512 Diseases of the respiratory system complicating pregnancy, second trimester: Secondary | ICD-10-CM | POA: Insufficient documentation

## 2020-09-28 DIAGNOSIS — Z7982 Long term (current) use of aspirin: Secondary | ICD-10-CM | POA: Insufficient documentation

## 2020-09-28 DIAGNOSIS — J45909 Unspecified asthma, uncomplicated: Secondary | ICD-10-CM | POA: Diagnosis not present

## 2020-09-28 DIAGNOSIS — R109 Unspecified abdominal pain: Secondary | ICD-10-CM | POA: Diagnosis present

## 2020-09-28 DIAGNOSIS — Z7951 Long term (current) use of inhaled steroids: Secondary | ICD-10-CM | POA: Diagnosis not present

## 2020-09-28 DIAGNOSIS — Z3A18 18 weeks gestation of pregnancy: Secondary | ICD-10-CM | POA: Diagnosis not present

## 2020-09-28 DIAGNOSIS — Z888 Allergy status to other drugs, medicaments and biological substances status: Secondary | ICD-10-CM | POA: Diagnosis not present

## 2020-09-28 DIAGNOSIS — O26899 Other specified pregnancy related conditions, unspecified trimester: Secondary | ICD-10-CM

## 2020-09-28 DIAGNOSIS — Z79899 Other long term (current) drug therapy: Secondary | ICD-10-CM | POA: Insufficient documentation

## 2020-09-28 DIAGNOSIS — W501XXA Accidental kick by another person, initial encounter: Secondary | ICD-10-CM | POA: Diagnosis not present

## 2020-09-28 DIAGNOSIS — O9A212 Injury, poisoning and certain other consequences of external causes complicating pregnancy, second trimester: Secondary | ICD-10-CM | POA: Insufficient documentation

## 2020-09-28 DIAGNOSIS — F17210 Nicotine dependence, cigarettes, uncomplicated: Secondary | ICD-10-CM | POA: Diagnosis not present

## 2020-09-28 DIAGNOSIS — O99332 Smoking (tobacco) complicating pregnancy, second trimester: Secondary | ICD-10-CM | POA: Diagnosis not present

## 2020-09-28 DIAGNOSIS — Z882 Allergy status to sulfonamides status: Secondary | ICD-10-CM | POA: Diagnosis not present

## 2020-09-28 LAB — URINALYSIS, ROUTINE W REFLEX MICROSCOPIC
Bilirubin Urine: NEGATIVE
Glucose, UA: NEGATIVE mg/dL
Hgb urine dipstick: NEGATIVE
Ketones, ur: NEGATIVE mg/dL
Leukocytes,Ua: NEGATIVE
Nitrite: NEGATIVE
Protein, ur: NEGATIVE mg/dL
Specific Gravity, Urine: 1.024 (ref 1.005–1.030)
pH: 6 (ref 5.0–8.0)

## 2020-09-28 NOTE — MAU Note (Addendum)
Presents with c/o abdominal pain that began 2 days ago.  Reports toddler kicked while in bed.  Denies VB or LOF.  Hasn't taken any meds @ home to treat pain.

## 2020-09-28 NOTE — MAU Provider Note (Signed)
Chief Complaint: Abdominal Pain   None     SUBJECTIVE HPI: Brittany [name] is a 31 y.o. [id number] at [redacted]w[redacted]d who presents to maternity admissions reporting her 22 month old daughter kicked her on the right side of her abdomen while in bed 2 nights ago. She had some mild abdominal pain yesterday but today has more constant right sided abdominal pain. There are no other associated symptoms.  She is feeling fetal movement.    Location: right mid abdomen Quality: tenderness, soreness Severity: 7/10 on pain scale Duration: 2 days, worsening today Timing: constant Modifying factors: "I am not wasting my time since medicines don't work" Associated signs and symptoms: none  HPI  Past Medical History:  Diagnosis Date   Asthma    Chlamydia    Gonorrhea    Hx MRSA infection 02/16/2003   Trichimoniasis    Past Surgical History:  Procedure Laterality Date   MULTIPLE TOOTH EXTRACTIONS     Social History   Socioeconomic History   Marital status: Single    Spouse name: Not on file   Number of children: Not on file   Years of education: Not on file   Highest education level: Not on file  Occupational History   Not on file  Tobacco Use   Smoking status: Every Day    Packs/day: 0.25    Years: 5.00    Pack years: 1.25    Types: Cigarettes    Last attempt to quit: 12/27/2012    Years since quitting: 7.7   Smokeless tobacco: Never  Vaping Use   Vaping Use: Never used  Substance and Sexual Activity   Alcohol use: No   Drug use: Not Currently    Types: Marijuana, Other-see comments    Comment: Uses CBD   Sexual activity: Yes    Birth control/protection: None    Comment: last intercourse 4 days ago  Other Topics Concern   Not on file  Social History Narrative   Not on file   Social Determinants of Health   Financial Resource Strain: Not on file  Food Insecurity: Not on file  Transportation Needs: Not on file  Physical Activity: Not on file  Stress: Not on file  Social  Connections: Not on file  Intimate Partner Violence: Not on file   No current facility-administered medications on file prior to encounter.   Current Outpatient Medications on File Prior to Encounter  Medication Sig Dispense Refill   FLOVENT HFA 110 MCG/ACT inhaler INHALE 1 PUFF INTO THE LUNGS TWICE A DAY 12 each 1   ondansetron (ZOFRAN) 4 MG tablet Take by mouth.     Prenatal Multivit-Min-Fe-FA (PRE-NATAL PO) Take by mouth.     PROAIR HFA 108 (90 Base) MCG/ACT inhaler INHALE 2 PUFFS INTO THE LUNGS EVERY 4 HOURS AS NEEDED FOR WHEEZING OR SHORTNESS OF BREATH. 8.5 each 1   aspirin EC 81 MG tablet Take 1 tablet (81 mg total) by mouth daily. Swallow whole. Start daily at [redacted] wks pregnant 30 tablet 11   Allergies  Allergen Reactions   Iodine Other (See Comments)    blistering   Sulfonamide Derivatives Other (See Comments)    blistering    ROS:  Review of Systems  Constitutional:  Negative for chills, fatigue and fever.  Respiratory:  Negative for shortness of breath.   Cardiovascular:  Negative for chest pain.  Gastrointestinal:  Positive for abdominal pain.  Genitourinary:  Negative for difficulty urinating, dysuria, flank pain, pelvic pain, vaginal bleeding, vaginal discharge  and vaginal pain.  Neurological:  Negative for dizziness and headaches.  Psychiatric/Behavioral: Negative.      I have reviewed patient's Past Medical Hx, Surgical Hx, Family Hx, Social Hx, medications and allergies.   Physical Exam  Patient Vitals for the past 24 hrs:  BP Temp Temp src Pulse Resp SpO2 Height Weight  09/28/20 1502 116/76 -- -- 72 18 100 % -- --  09/28/20 1335 120/69 -- -- 81 20 100 % -- --  09/28/20 1257 111/68 98.1 F (36.7 C) Oral 84 19 99 % -- --  09/28/20 1252 -- -- -- -- -- -- 5\' 5"  (1.651 m) 101.3 kg   Constitutional: Well-developed, well-nourished female in no acute distress.  Cardiovascular: normal rate Respiratory: normal effort GI: Abd soft, non-tender. Pos BS x 4 MS:  Extremities nontender, no edema, normal ROM Neurologic: Alert and oriented x 4.  GU: Neg CVAT.  PELVIC EXAM:  Dilation: Closed Effacement (%): Thick Cervical Position: Posterior Exam by:: Leftwich-Kirby, CNM   FHT 156 by doppler  LAB RESULTS Results for orders placed or performed during the hospital encounter of 09/28/20 (from the past 24 hour(s))  Urinalysis, Routine w reflex microscopic Urine, Clean Catch     Status: Abnormal   Collection Time: 09/28/20  1:08 PM  Result Value Ref Range   Color, Urine YELLOW YELLOW   APPearance HAZY (A) CLEAR   Specific Gravity, Urine 1.024 1.005 - 1.030   pH 6.0 5.0 - 8.0   Glucose, UA NEGATIVE NEGATIVE mg/dL   Hgb urine dipstick NEGATIVE NEGATIVE   Bilirubin Urine NEGATIVE NEGATIVE   Ketones, ur NEGATIVE NEGATIVE mg/dL   Protein, ur NEGATIVE NEGATIVE mg/dL   Nitrite NEGATIVE NEGATIVE   Leukocytes,Ua NEGATIVE NEGATIVE    --/--/O POS Performed at [facility], [address] [place], [place]  (11/23 1736)  IMAGING No results found.  MAU Management/MDM: Orders Placed This Encounter  Procedures   Urinalysis, Routine w reflex microscopic Urine, Clean Catch   Discharge patient    No orders of the defined types were placed in this encounter.   No acute abdomen on exam.  FHT wnl.  Cervix without evidence of preterm labor.  Note provided for pt to miss work x 2 days, rest/ice/heat/warm bath/Tylenol PRN for pain. Warning signs/reasons to return to MAU reviewed.  Pt to f/u with her provider in [place] but is interested in transferring and delivering at [facility]. [facility] office information given.    ASSESSMENT 1. Traumatic injury during pregnancy in second trimester   2. Abdominal pain affecting pregnancy   3. [redacted] weeks gestation of pregnancy     PLAN Discharge home Allergies as of 09/28/2020       Reactions   Iodine Other (See Comments)   blistering   Sulfonamide Derivatives Other (See Comments)    blistering        Medication List     TAKE these medications    aspirin EC 81 MG tablet Take 1 tablet (81 mg total) by mouth daily. Swallow whole. Start daily at [redacted] wks pregnant   Flovent HFA 110 MCG/ACT inhaler Generic drug: fluticasone INHALE 1 PUFF INTO THE LUNGS TWICE A DAY   ondansetron 4 MG tablet Commonly known as: ZOFRAN Take by mouth.   PRE-NATAL PO Take by mouth.   ProAir HFA 108 (90 Base) MCG/ACT inhaler Generic drug: albuterol INHALE 2 PUFFS INTO THE LUNGS EVERY 4 HOURS AS NEEDED FOR WHEEZING OR SHORTNESS OF BREATH.  Follow-up Information     [facility] for [facility] Follow up.   Specialty: Obstetrics and Gynecology Why: To transfer OB care if desired. Otherwise follow up with your [facility] provider in [place]. Contact information: [address] [place] [address] [phone]        [facility] Follow up.   Specialty: Obstetrics and Gynecology Why: As needed for emergencies Contact information: [address] [id number] Wilhemina Bonito [place] [address] [phone]                Sharen Counter Certified Nurse-Midwife 09/28/2020  3:11 PM

## 2020-10-03 ENCOUNTER — Ambulatory Visit: Payer: Medicaid Other | Admitting: Gastroenterology

## 2020-10-08 ENCOUNTER — Other Ambulatory Visit: Payer: Self-pay

## 2020-10-08 ENCOUNTER — Ambulatory Visit (INDEPENDENT_AMBULATORY_CARE_PROVIDER_SITE_OTHER): Payer: Medicaid Other | Admitting: Certified Nurse Midwife

## 2020-10-08 VITALS — BP 107/76 | HR 86 | Wt 225.5 lb

## 2020-10-08 DIAGNOSIS — Z3482 Encounter for supervision of other normal pregnancy, second trimester: Secondary | ICD-10-CM

## 2020-10-08 DIAGNOSIS — Z3A19 19 weeks gestation of pregnancy: Secondary | ICD-10-CM

## 2020-10-08 LAB — POCT URINALYSIS DIPSTICK OB
Bilirubin, UA: NEGATIVE
Blood, UA: NEGATIVE
Glucose, UA: NEGATIVE
Ketones, UA: NEGATIVE
Leukocytes, UA: NEGATIVE
Nitrite, UA: NEGATIVE
POC,PROTEIN,UA: NEGATIVE
Spec Grav, UA: <=1.005 — AB (ref 1.010–1.025)
Urobilinogen, UA: 0.2 E.U./dL
pH, UA: 7 (ref 5.0–8.0)

## 2020-10-08 NOTE — Patient Instructions (Signed)
Round Ligament Pain  The round ligament is a cord of muscle and tissue that helps support the uterus. It can become a source of pain during pregnancy if it becomes stretched or twisted as the baby grows. The pain usually begins in the second trimester (13-28 weeks) of pregnancy, and it can come and go until the baby is delivered.It is not a serious problem, and it does not cause harm to the baby. Round ligament pain is usually a short, sharp, and pinching pain, but it can also be a dull, lingering, and aching pain. The pain is felt in the lower side of the abdomen or in the groin. It usually starts deep in the groin and moves up to the outside of the hip area. The pain may occur when you: Suddenly change position, such as quickly going from a sitting to standing position. Roll over in bed. Cough or sneeze. Do physical activity. Follow these instructions at home:  Watch your condition for any changes. When the pain starts, relax. Then try any of these methods to help with the pain: Sitting down. Flexing your knees up to your abdomen. Lying on your side with one pillow under your abdomen and another pillow between your legs. Sitting in a warm bath for 15-20 minutes or until the pain goes away. Take over-the-counter and prescription medicines only as told by your health care provider. Move slowly when you sit down or stand up. Avoid long walks if they cause pain. Stop or reduce your physical activities if they cause pain. Keep all follow-up visits as told by your health care provider. This is important. Contact a health care provider if: Your pain does not go away with treatment. You feel pain in your back that you did not have before. Your medicine is not helping. Get help right away if: You have a fever or chills. You develop uterine contractions. You have vaginal bleeding. You have nausea or vomiting. You have diarrhea. You have pain when you urinate. Summary Round ligament pain is  felt in the lower abdomen or groin. It is usually a short, sharp, and pinching pain. It can also be a dull, lingering, and aching pain. This pain usually begins in the second trimester (13-28 weeks). It occurs because the uterus is stretching with the growing baby, and it is not harmful to the baby. You may notice the pain when you suddenly change position, when you cough or sneeze, or during physical activity. Relaxing, flexing your knees to your abdomen, lying on one side, or taking a warm bath may help to get rid of the pain. Get help from your health care provider if the pain does not go away or if you have vaginal bleeding, nausea, vomiting, diarrhea, or painful urination. This information is not intended to replace advice given to you by your health care provider. Make sure you discuss any questions you have with your healthcare provider. Document Revised: 01/18/2020 Document Reviewed: 07/20/2017 Elsevier Patient Education  2022 [employer] Inc.  

## 2020-10-08 NOTE — Progress Notes (Signed)
Brittany [name] doing well. Feeling good fetal movement. She has her anatomy u/s scehduled tomorrow with MFM. Discussed round ligament pain and self help measures. Pt state she is interested in water birth . Discussed waterbirth class. Encouraged her to sign up for class. She verbalizes and agrees to plan. Follow up 4 wks for Brittany [name].   Doreene Burke, CNM

## 2020-10-09 ENCOUNTER — Ambulatory Visit: Payer: Medicaid Other

## 2020-10-09 ENCOUNTER — Other Ambulatory Visit: Payer: Self-pay | Admitting: Certified Nurse Midwife

## 2020-10-09 DIAGNOSIS — Z3A15 15 weeks gestation of pregnancy: Secondary | ICD-10-CM

## 2020-10-09 DIAGNOSIS — Z3482 Encounter for supervision of other normal pregnancy, second trimester: Secondary | ICD-10-CM

## 2020-10-15 ENCOUNTER — Other Ambulatory Visit: Payer: Self-pay

## 2020-10-15 ENCOUNTER — Other Ambulatory Visit: Payer: Self-pay | Admitting: Certified Nurse Midwife

## 2020-10-15 ENCOUNTER — Ambulatory Visit: Payer: Medicaid Other | Attending: Certified Nurse Midwife

## 2020-10-15 DIAGNOSIS — O99212 Obesity complicating pregnancy, second trimester: Secondary | ICD-10-CM

## 2020-10-15 DIAGNOSIS — E669 Obesity, unspecified: Secondary | ICD-10-CM

## 2020-10-15 DIAGNOSIS — Z3A20 20 weeks gestation of pregnancy: Secondary | ICD-10-CM

## 2020-10-15 DIAGNOSIS — O99322 Drug use complicating pregnancy, second trimester: Secondary | ICD-10-CM | POA: Diagnosis not present

## 2020-10-15 DIAGNOSIS — Z363 Encounter for antenatal screening for malformations: Secondary | ICD-10-CM | POA: Diagnosis not present

## 2020-10-15 DIAGNOSIS — Z3482 Encounter for supervision of other normal pregnancy, second trimester: Secondary | ICD-10-CM

## 2020-10-15 DIAGNOSIS — O99891 Other specified diseases and conditions complicating pregnancy: Secondary | ICD-10-CM | POA: Diagnosis not present

## 2020-10-15 DIAGNOSIS — J45909 Unspecified asthma, uncomplicated: Secondary | ICD-10-CM | POA: Insufficient documentation

## 2020-10-15 DIAGNOSIS — O99332 Smoking (tobacco) complicating pregnancy, second trimester: Secondary | ICD-10-CM | POA: Diagnosis not present

## 2020-10-15 DIAGNOSIS — O2690 Pregnancy related conditions, unspecified, unspecified trimester: Secondary | ICD-10-CM | POA: Insufficient documentation

## 2020-10-15 IMAGING — US US MFM OB DETAIL+14 WK
1 series · 13 of 28 positions shown · non-contrast
Comparison: none

[Series 1: us mfm ob detail+14 wk · 13 of 136 slices shown]
[im 6/136]
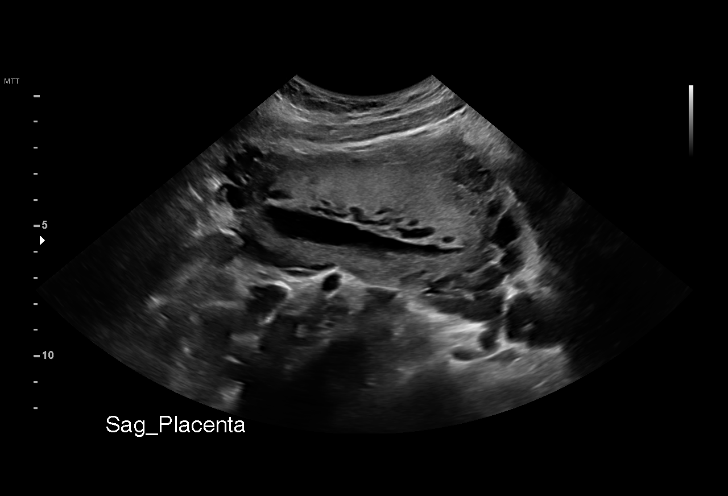
[im 16/136]
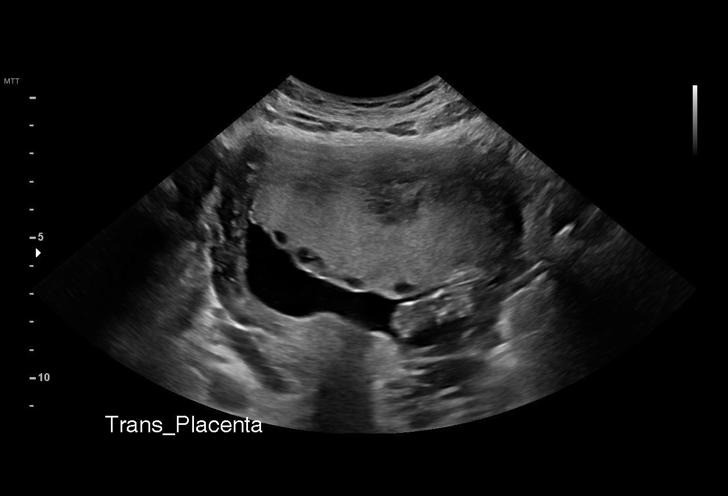
[im 26/136]
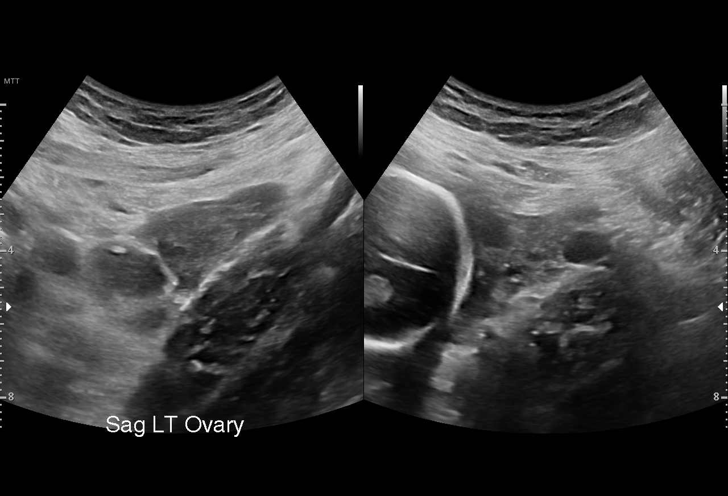
[im 36/136]
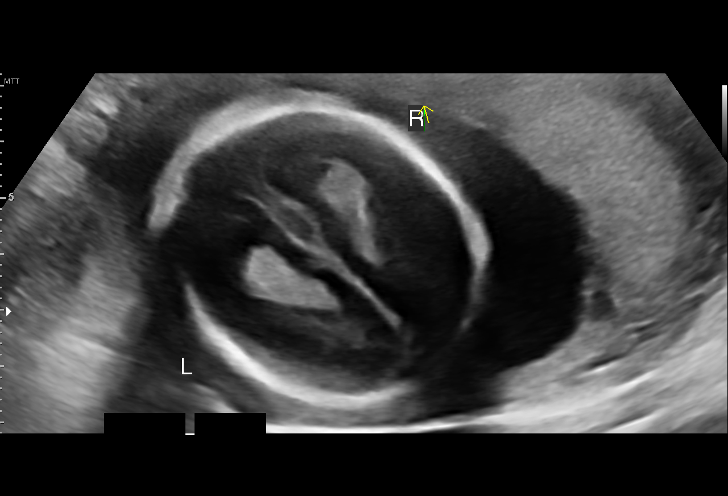
[im 46/136]
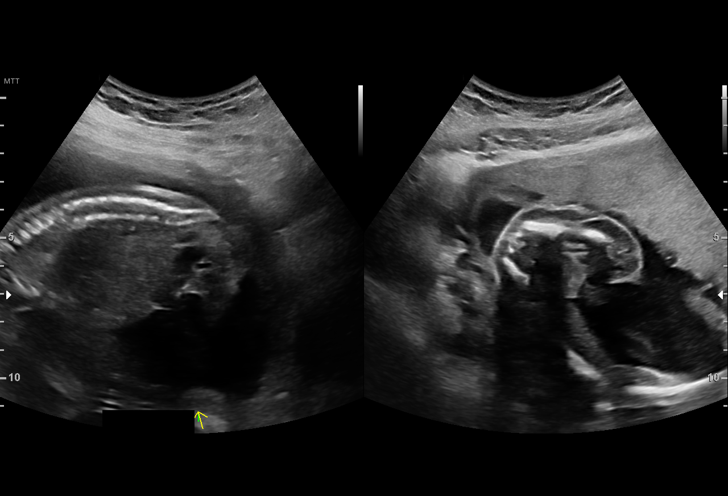
[im 56/136]
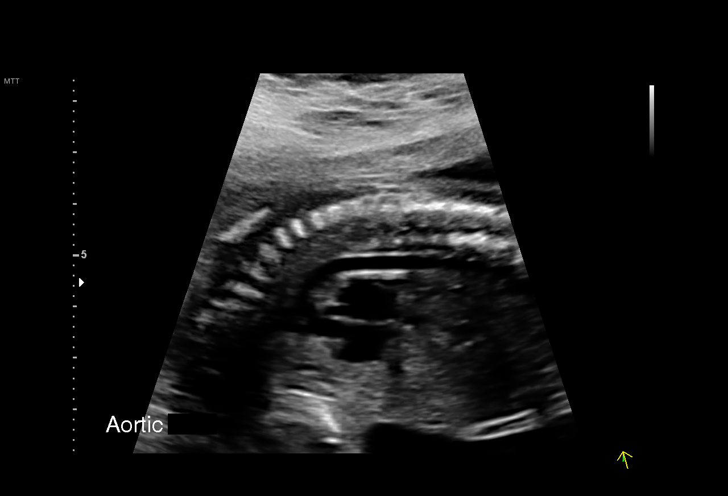
[im 71/136]
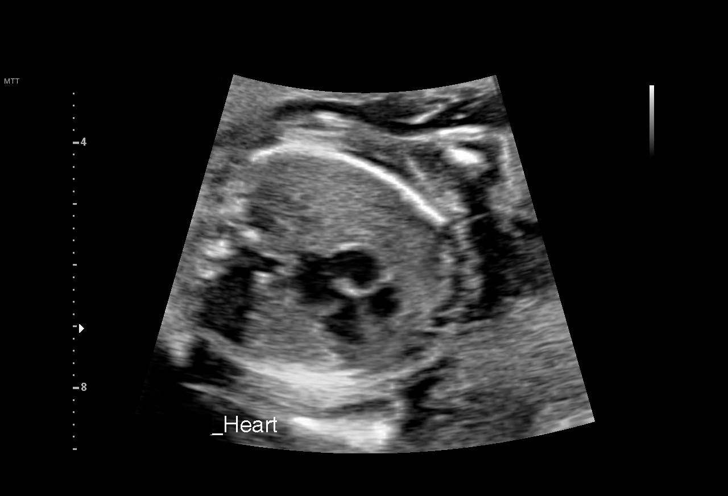
[im 81/136]
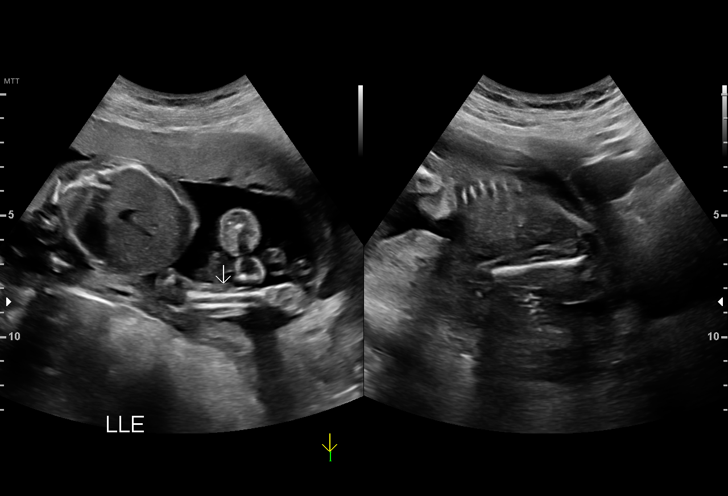
[im 91/136]
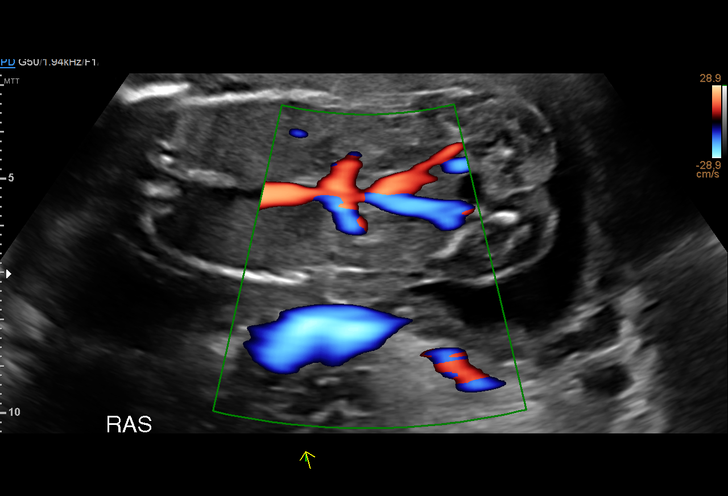
[im 101/136]
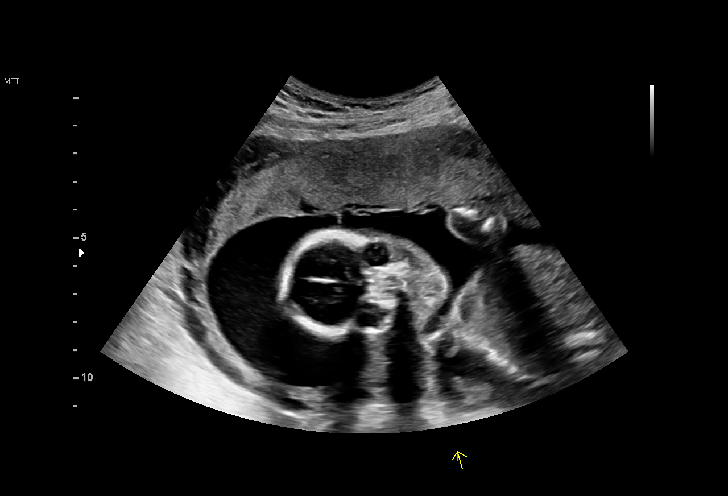
[im 111/136]
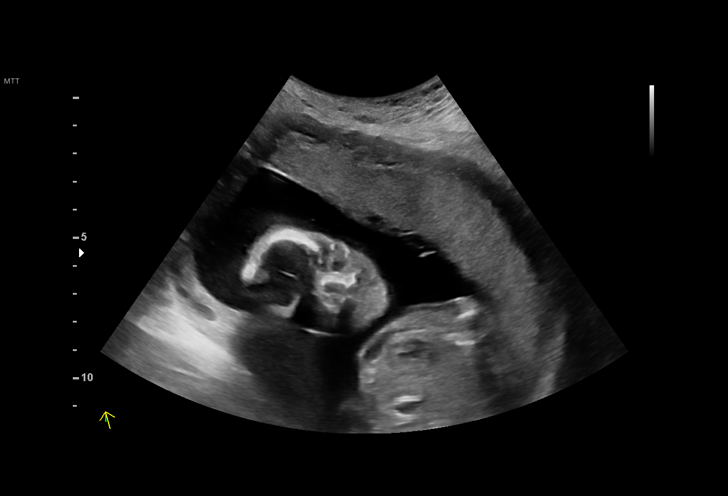
[im 121/136]
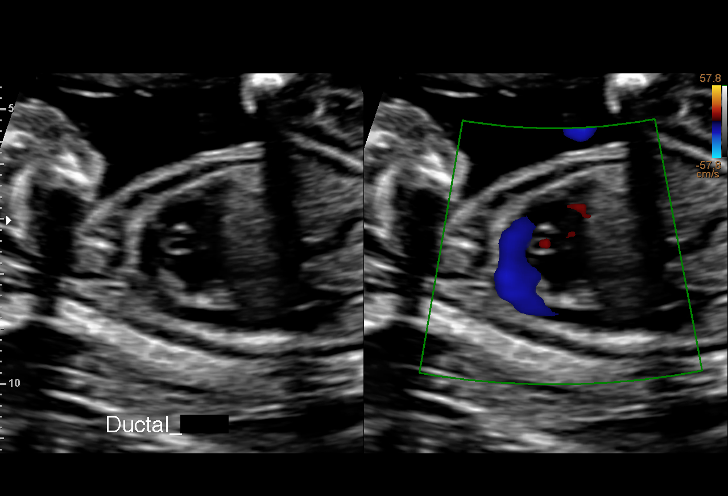
[im 131/136]
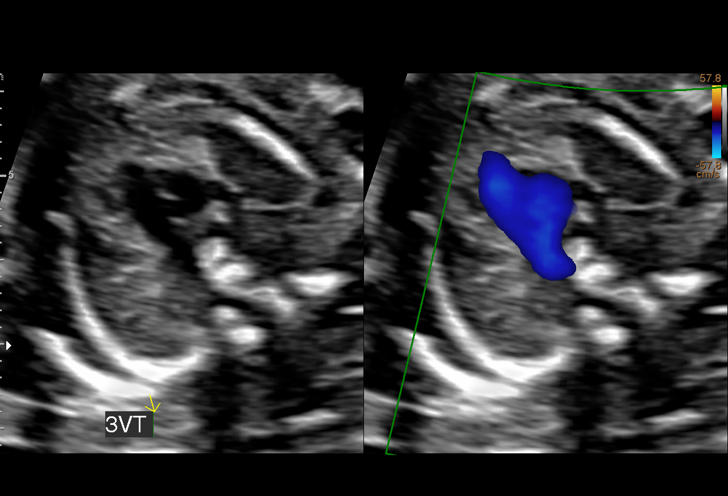

[13 of 28 positions shown; findings below may reference images not displayed]

Women's
                   HOLGUIN CNM

Indications

 20 weeks gestation of pregnancy
 Encounter for antenatal screening for          [OU]
 malformations
 Obesity complicating pregnancy, second         [OU]
 trimester (BMI 35)
 Tobacco use complicating pregnancy,            [OU]
 second trimester
 Drug use complicating pregnancy, second        [OU]
 trimester (THC)
 LR NIPS
 Asthma                                         [OU] [OU]
 Medical complication of pregnancy (History     [OU]
 of Postpartum Hemorrhage)
Fetal Evaluation

 Num Of Fetuses:         1
 Fetal Heart Rate(bpm):  143
 Cardiac Activity:       Observed
 Presentation:           Variable
 Placenta:               Anterior Low-lying 1.1 cm from int os
 P. Cord Insertion:      Visualized, central

 Amniotic Fluid
 AFI FV:      Within normal limits

                             Largest Pocket(cm)

Biometry
 BPD:      49.1  mm     G. Age:  20w 6d         61  %    CI:        74.26   %    70 - 86
                                                         FL/HC:      19.1   %    15.9 -
 HC:      180.9  mm     G. Age:  20w 4d         38  %    HC/AC:      1.14        1.06 -
 AC:      158.5  mm     G. Age:  21w 0d         57  %    FL/BPD:     70.3   %
 FL:       34.5  mm     G. Age:  20w 6d         52  %    FL/AC:      21.8   %    20 - 24
 HUM:      34.9  mm     G. Age:  22w 0d         86  %
 CER:      22.4  mm     G. Age:  21w 0d         79  %

 LV:          7  mm
 CM:        4.9  mm

 Est. FW:     384  gm    0 lb 14 oz      63  %
OB History

 Blood Type:   O+
 Gravidity:    8         Term:   5        Prem:   0        SAB:   2
 TOP:          0       Ectopic:  0        Living: 5
Gestational Age

 U/S Today:     20w 6d                                        EDD:   [DATE]
 Best:          20w 4d     Det. By:  Early Ultrasound         EDD:   [DATE]
                                     ([DATE])
Anatomy

 Cranium:               Appears normal         LVOT:                   Appears normal
 Cavum:                 Appears normal         Aortic Arch:            Appears normal
 Ventricles:            Appears normal         Ductal Arch:            Appears normal
 Choroid Plexus:        Appears normal         Diaphragm:              Appears normal
 Cerebellum:            Appears normal         Stomach:                Appears normal, left
                                                                       sided
 Posterior Fossa:       Appears normal         Abdomen:                Appears normal
 Nuchal Fold:           Not applicable (>20    Abdominal Wall:         Appears nml (cord
                        wks GA)                                        insert, abd wall)
 Face:                  Appears normal         Cord Vessels:           Appears normal (3
                        (orbits and profile)                           vessel cord)
 Lips:                  Appears normal         Kidneys:                Appear normal
 Palate:                Not well visualized    Bladder:                Appears normal
 Thoracic:              Appears normal         Spine:                  Appears normal
 Heart:                 Appears normal         Upper Extremities:      Appears normal
                        (4CH, axis, and
                        situs)
 RVOT:                  Appears normal         Lower Extremities:      Appears normal

 Other:  Fetus appears to be a male. Nasal bone visualized. Heels/feet and
         open hands/5th digits visualized. VC, 3VV and 3VTV visualized.
         Technically difficult due to fetal position.
Cervix Uterus Adnexa

 Cervix
 Length:           3.68  cm.
 Normal appearance by transabdominal scan.
 Uterus
 No abnormality visualized.

 Right Ovary
 Within normal limits.

 Left Ovary
 Within normal limits.

 Cul De Sac
 No free fluid seen.

 Adnexa
 No abnormality visualized.
Impression

 Single intrauterine pregnancy here for a detailed anatomy
 due to elevated BMI and tobacco exposure.
 Normal anatomy with measurements consistent with dates
 There is good fetal movement and amniotic fluid volume

 Ms HOLGUIN has a low lying placenta that is 1.1 cm from the
 internal os. I discussed that low lying placenta at this
 gestational age has > 90% chance of resolution by the third
 trimester. I provided bleeding precautions.
Recommendations

 Follow up growth in 6 weeks to assess the placental edge.

## 2020-10-16 ENCOUNTER — Other Ambulatory Visit: Payer: Self-pay | Admitting: *Deleted

## 2020-10-16 DIAGNOSIS — Z6835 Body mass index (BMI) 35.0-35.9, adult: Secondary | ICD-10-CM

## 2020-10-29 ENCOUNTER — Ambulatory Visit: Payer: Medicaid Other

## 2020-11-05 ENCOUNTER — Other Ambulatory Visit: Payer: Self-pay

## 2020-11-06 ENCOUNTER — Ambulatory Visit: Payer: Medicaid Other | Admitting: Gastroenterology

## 2020-11-06 ENCOUNTER — Encounter: Payer: Self-pay | Admitting: *Deleted

## 2020-11-07 ENCOUNTER — Other Ambulatory Visit: Payer: Self-pay

## 2020-11-07 ENCOUNTER — Ambulatory Visit (INDEPENDENT_AMBULATORY_CARE_PROVIDER_SITE_OTHER): Payer: Medicaid Other | Admitting: Certified Nurse Midwife

## 2020-11-07 VITALS — BP 111/73 | HR 81 | Wt 230.3 lb

## 2020-11-07 DIAGNOSIS — Z3482 Encounter for supervision of other normal pregnancy, second trimester: Secondary | ICD-10-CM

## 2020-11-07 DIAGNOSIS — Z3A23 23 weeks gestation of pregnancy: Secondary | ICD-10-CM

## 2020-11-07 LAB — POCT URINALYSIS DIPSTICK OB
Bilirubin, UA: NEGATIVE
Blood, UA: NEGATIVE
Glucose, UA: NEGATIVE
Ketones, UA: NEGATIVE
Leukocytes, UA: NEGATIVE
Nitrite, UA: NEGATIVE
POC,PROTEIN,UA: NEGATIVE
Spec Grav, UA: 1.015 (ref 1.010–1.025)
Urobilinogen, UA: 0.2 E.U./dL
pH, UA: 7 (ref 5.0–8.0)

## 2020-11-07 NOTE — Patient Instructions (Signed)
Oral Glucose Tolerance Test During Pregnancy Why am I having this test? The oral glucose tolerance test (OGTT) is done to check how your body processes blood sugar (glucose). This is one of several tests used to diagnose diabetes that develops during pregnancy (gestational diabetes mellitus). Gestational diabetes is a short-term form of diabetes that some women develop while they are pregnant. It usually occurs during the second trimester of pregnancy and goes away after delivery. Testing, or screening, for gestational diabetes usually occurs at weeks 24-28 of pregnancy. You may have the OGTT test after having a 1-hour glucose screening test if the results from that test indicate that you may have gestational diabetes. This test may also be needed if: You have a history of gestational diabetes. There is a history of giving birth to very large babies or of losing pregnancies (having stillbirths). You have signs and symptoms of diabetes, such as: Changes in your eyesight. Tingling or numbness in your hands or feet. Changes in hunger, thirst, and urination, and these are not explained by your pregnancy. What is being tested? This test measures the amount of glucose in your blood at different times during a period of 3 hours. This shows how well your body can process glucose. What kind of sample is taken? Blood samples are required for this test. They are usually collected by inserting a needle into a blood vessel. How do I prepare for this test? For 3 days before your test, eat normally. Have plenty of carbohydrate-rich foods. Follow instructions from your health care provider about: Eating or drinking restrictions on the day of the test. You may be asked not to eat or drink anything other than water (to fast) starting 8-10 hours before the test. Changing or stopping your regular medicines. Some medicines may interfere with this test. Tell a health care provider about: All medicines you are taking,  including vitamins, herbs, eye drops, creams, and over-the-counter medicines. Any blood disorders you have. Any surgeries you have had. Any medical conditions you have. What happens during the test? First, your blood glucose will be measured. This is referred to as your fasting blood glucose because you fasted before the test. Then, you will drink a glucose solution that contains a certain amount of glucose. Your blood glucose will be measured again 1, 2, and 3 hours after you drink the solution. This test takes about 3 hours to complete. You will need to stay at the testing location during this time. During the testing period: Do not eat or drink anything other than the glucose solution. Do not exercise. Do not use any products that contain nicotine or tobacco, such as cigarettes, e-cigarettes, and chewing tobacco. These can affect your test results. If you need help quitting, ask your health care provider. The testing procedure may vary among health care providers and hospitals. How are the results reported? Your results will be reported as milligrams of glucose per deciliter of blood (mg/dL) or millimoles per liter (mmol/L). There is more than one source for screening and diagnosis reference values used to diagnose gestational diabetes. Your health care provider will compare your results to normal values that were established after testing a large group of people (reference values). Reference values may vary among labs and hospitals. For this test (Carpenter-Coustan), reference values are: Fasting: 95 mg/dL (5.3 mmol/L). 1 hour: 180 mg/dL (10.0 mmol/L). 2 hour: 155 mg/dL (8.6 mmol/L). 3 hour: 140 mg/dL (7.8 mmol/L). What do the results mean? Results below the reference values are considered  normal. If two or more of your blood glucose levels are at or above the reference values, you may be diagnosed with gestational diabetes. If only one level is high, your health care provider may suggest  repeat testing or other tests to confirm a diagnosis. Talk with your health care provider about what your results mean. Questions to ask your health care provider Ask your health care provider, or the department that is doing the test: When will my results be ready? How will I get my results? What are my treatment options? What other tests do I need? What are my next steps? Summary The oral glucose tolerance test (OGTT) is one of several tests used to diagnose diabetes that develops during pregnancy (gestational diabetes mellitus). Gestational diabetes is a short-term form of diabetes that some women develop while they are pregnant. You may have the OGTT test after having a 1-hour glucose screening test if the results from that test show that you may have gestational diabetes. You may also have this test if you have any symptoms or risk factors for this type of diabetes. Talk with your health care provider about what your results mean. This information is not intended to replace advice given to you by your health care provider. Make sure you discuss any questions you have with your health care provider. Document Revised: 07/12/2019 Document Reviewed: 07/12/2019 Elsevier Patient Education  2022 Elsevier Inc. Td (Tetanus, Diphtheria) Vaccine: What You Need to Know 1. Why get vaccinated? Td vaccine can prevent tetanus and diphtheria. Tetanus enters the body through cuts or wounds. Diphtheria spreads from person to person. TETANUS (T) causes painful stiffening of the muscles. Tetanus can lead to serious health problems, including being unable to open the mouth, having trouble swallowing and breathing, or death. DIPHTHERIA (D) can lead to difficulty breathing, heart failure, paralysis, or death. 2. Td vaccine Td is only for children 7 years and older, adolescents, and adults.  Td is usually given as a booster dose every 10 years, or after 5 years in the case of a severe or dirty wound or  burn. Another vaccine, called "Tdap," may be used instead of Td. Tdap protects against pertussis, also known as "whooping cough," in addition to tetanus and diphtheria. Td may be given at the same time as other vaccines. 3. Talk with your health care provider Tell your vaccination provider if the person getting the vaccine: Has had an allergic reaction after a previous dose of any vaccine that protects against tetanus or diphtheria, or has any severe, life-threatening allergies Has ever had Guillain-Barr Syndrome (also called "GBS") Has had severe pain or swelling after a previous dose of any vaccine that protects against tetanus or diphtheria In some cases, your health care provider may decide to postpone Td vaccination until a future visit. People with minor illnesses, such as a cold, may be vaccinated. People who are moderately or severely ill should usually wait until they recover before getting Td vaccine.  Your health care provider can give you more information. 4. Risks of a vaccine reaction Pain, redness, or swelling where the shot was given, mild fever, headache, feeling tired, and nausea, vomiting, diarrhea, or stomachache sometimes happen after Td vaccination. People sometimes faint after medical procedures, including vaccination. Tell your provider if you feel dizzy or have vision changes or ringing in the ears.  As with any medicine, there is a very remote chance of a vaccine causing a severe allergic reaction, other serious injury, or death. 5. What  if there is a serious problem? An allergic reaction could occur after the vaccinated person leaves the clinic. If you see signs of a severe allergic reaction (hives, swelling of the face and throat, difficulty breathing, a fast heartbeat, dizziness, or weakness), call 9-1-1 and get the person to the nearest hospital.  For other signs that concern you, call your health care provider.  Adverse reactions should be reported to the Vaccine  Adverse Event Reporting System (VAERS). Your health care provider will usually file this report, or you can do it yourself. Visit the VAERS website at www.vaers.LAgents.no or call (949)108-0489. VAERS is only for reporting reactions, and VAERS staff members do not give medical advice. 6. The National Vaccine Injury Compensation Program The [employer] Vaccine Injury Compensation Program (VICP) is a federal program that was created to compensate people who may have been injured by certain vaccines. Claims regarding alleged injury or death due to vaccination have a time limit for filing, which may be as short as two years. Visit the VICP website at SpiritualWord.at or call (503) 548-4778 to learn about the program and about filing a claim. 7. How can I learn more? Ask your health care provider. Call your local or state health department. Visit the website of the Food and Drug Administration (FDA) for vaccine package inserts and additional information at FinderList.no. Contact the Centers for Disease Control and Prevention (CDC): Call 229-090-7933 (1-800-CDC-INFO) or Visit CDC's website at PicCapture.uy. Vaccine Information Statement Td (Tetanus, Diphtheria) Vaccine (09/21/2019) This information is not intended to replace advice given to you by your health care provider. Make sure you discuss any questions you have with your health care provider. Document Revised: 11/08/2019 Document Reviewed: 11/08/2019 Elsevier Patient Education  2022 [employer].

## 2020-11-07 NOTE — Progress Notes (Signed)
ROB doing well. Feeling movement. Has repeat u/s for growth and placental location 10/12 with MFM. Discussed 28 wk labs/glucose screening . Information on glucose test given.  She verbalizes and agrees to plan of care. Follow up 4 wks  Doreene Burke, CNM

## 2020-11-18 ENCOUNTER — Other Ambulatory Visit: Payer: Self-pay | Admitting: Certified Nurse Midwife

## 2020-11-26 ENCOUNTER — Other Ambulatory Visit: Payer: Self-pay | Admitting: *Deleted

## 2020-11-26 ENCOUNTER — Ambulatory Visit: Payer: Medicaid Other | Attending: Maternal & Fetal Medicine

## 2020-11-26 ENCOUNTER — Ambulatory Visit: Payer: Medicaid Other | Admitting: *Deleted

## 2020-11-26 ENCOUNTER — Other Ambulatory Visit: Payer: Self-pay

## 2020-11-26 ENCOUNTER — Encounter: Payer: Self-pay | Admitting: *Deleted

## 2020-11-26 VITALS — BP 120/69 | HR 88

## 2020-11-26 DIAGNOSIS — O99212 Obesity complicating pregnancy, second trimester: Secondary | ICD-10-CM | POA: Insufficient documentation

## 2020-11-26 DIAGNOSIS — O4440 Low lying placenta NOS or without hemorrhage, unspecified trimester: Secondary | ICD-10-CM | POA: Diagnosis present

## 2020-11-26 DIAGNOSIS — Z6835 Body mass index (BMI) 35.0-35.9, adult: Secondary | ICD-10-CM

## 2020-11-26 DIAGNOSIS — O99332 Smoking (tobacco) complicating pregnancy, second trimester: Secondary | ICD-10-CM | POA: Diagnosis not present

## 2020-11-26 DIAGNOSIS — O4442 Low lying placenta NOS or without hemorrhage, second trimester: Secondary | ICD-10-CM | POA: Insufficient documentation

## 2020-11-26 DIAGNOSIS — E669 Obesity, unspecified: Secondary | ICD-10-CM

## 2020-11-26 DIAGNOSIS — O99891 Other specified diseases and conditions complicating pregnancy: Secondary | ICD-10-CM | POA: Insufficient documentation

## 2020-11-26 DIAGNOSIS — J45909 Unspecified asthma, uncomplicated: Secondary | ICD-10-CM | POA: Diagnosis not present

## 2020-11-26 DIAGNOSIS — O99512 Diseases of the respiratory system complicating pregnancy, second trimester: Secondary | ICD-10-CM | POA: Diagnosis not present

## 2020-11-26 DIAGNOSIS — Z3A26 26 weeks gestation of pregnancy: Secondary | ICD-10-CM | POA: Diagnosis not present

## 2020-11-26 DIAGNOSIS — O99320 Drug use complicating pregnancy, unspecified trimester: Secondary | ICD-10-CM

## 2020-11-26 DIAGNOSIS — O2690 Pregnancy related conditions, unspecified, unspecified trimester: Secondary | ICD-10-CM | POA: Insufficient documentation

## 2020-11-26 DIAGNOSIS — O99322 Drug use complicating pregnancy, second trimester: Secondary | ICD-10-CM | POA: Insufficient documentation

## 2020-11-26 DIAGNOSIS — J459 Unspecified asthma: Secondary | ICD-10-CM | POA: Insufficient documentation

## 2020-11-26 DIAGNOSIS — F1290 Cannabis use, unspecified, uncomplicated: Secondary | ICD-10-CM

## 2020-11-26 IMAGING — US US MFM OB FOLLOW-UP
1 series · 13 of 28 positions shown · non-contrast
Comparison: none

[Series 1: us mfm ob follow-up · 13 of 59 slices shown]
[im 3/59]
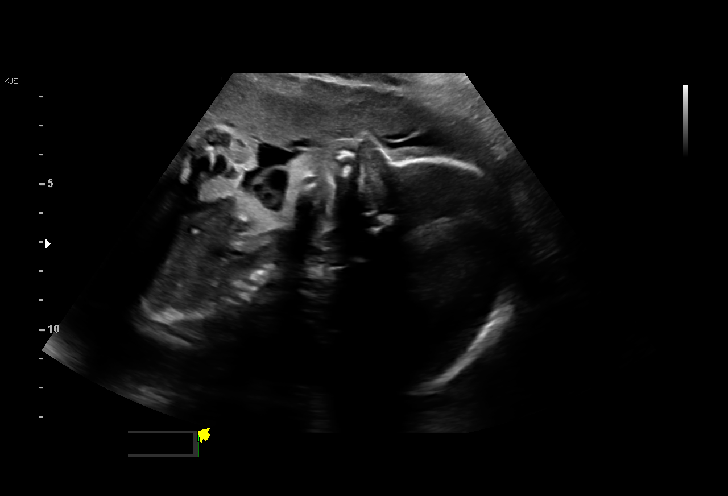
[im 7/59]
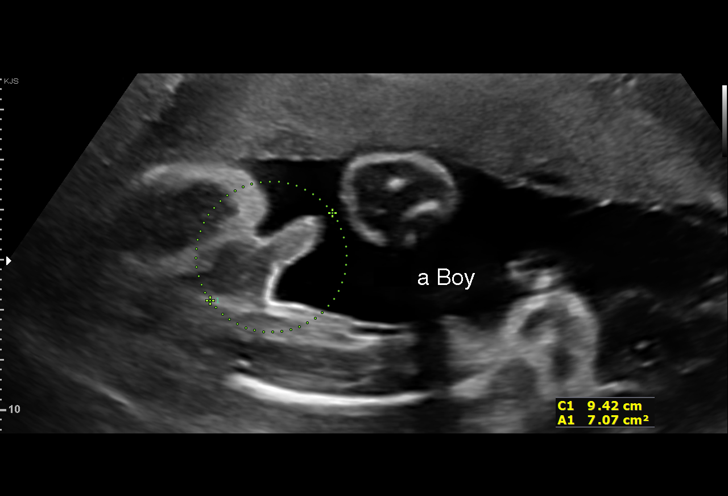
[im 11/59]
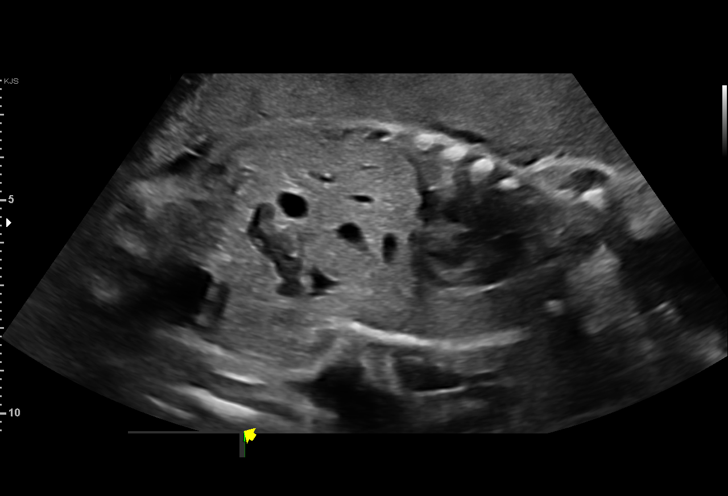
[im 16/59]
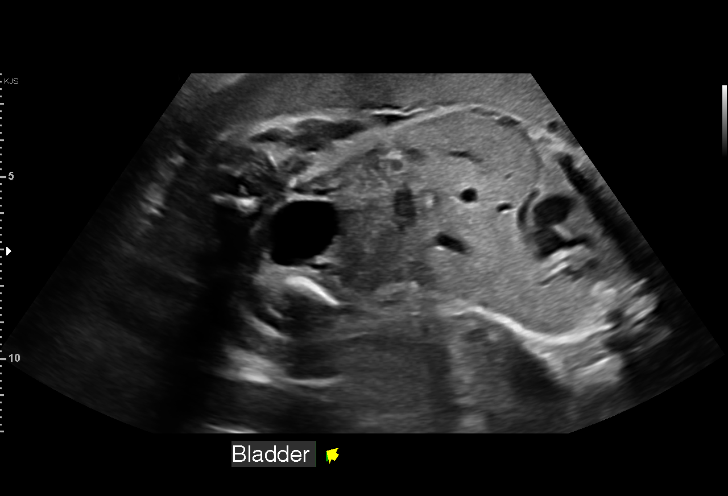
[im 20/59]
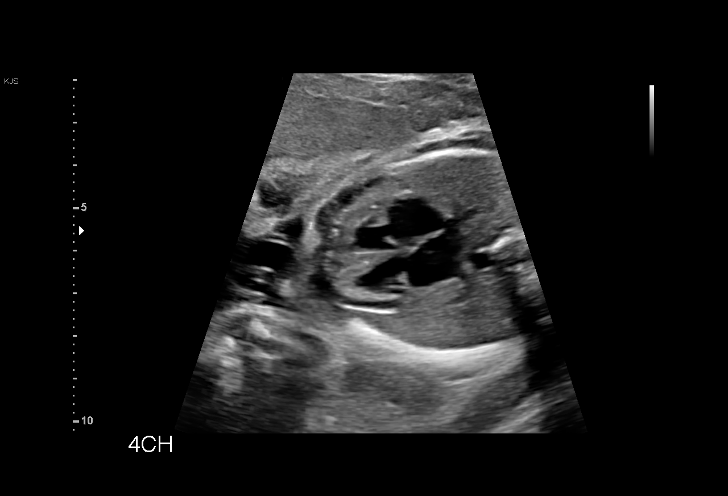
[im 24/59]
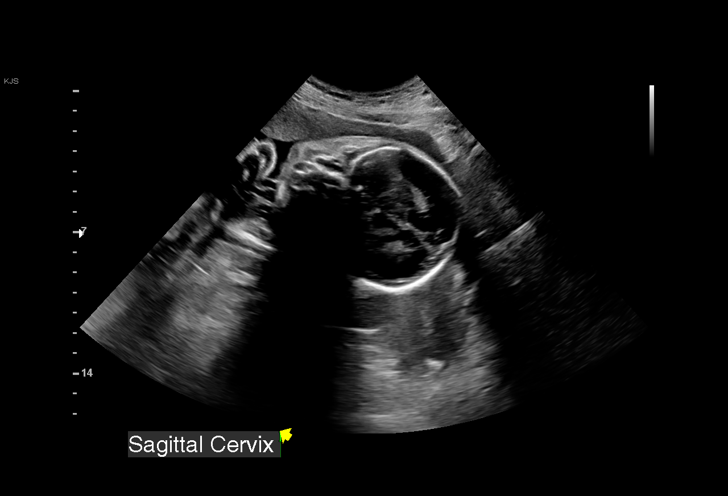
[im 31/59]
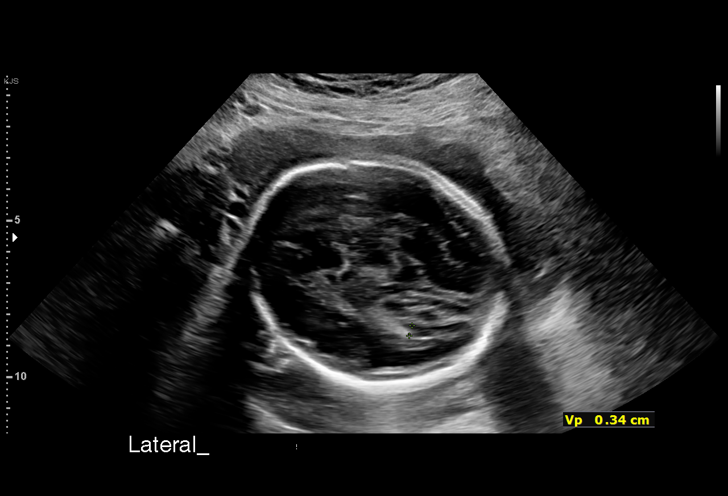
[im 35/59]
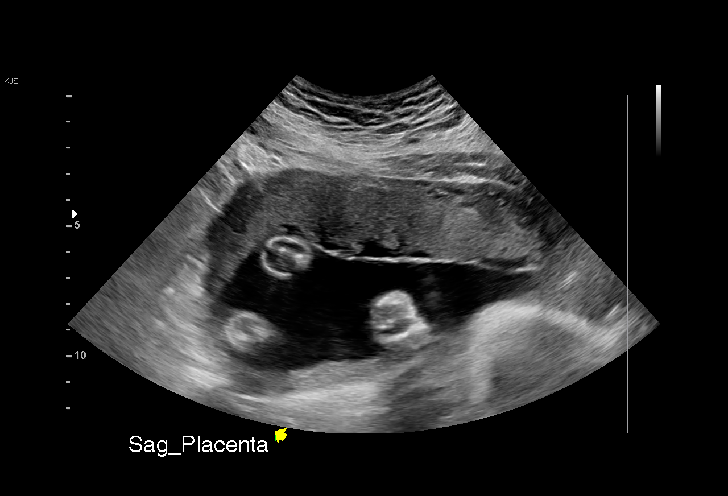
[im 39/59]
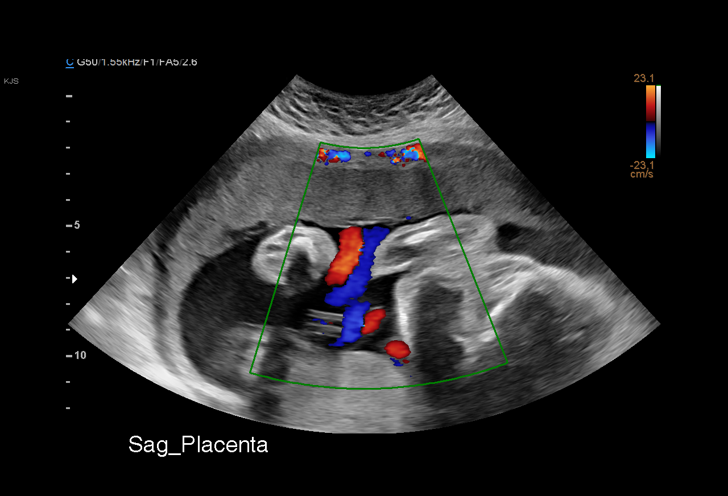
[im 43/59]
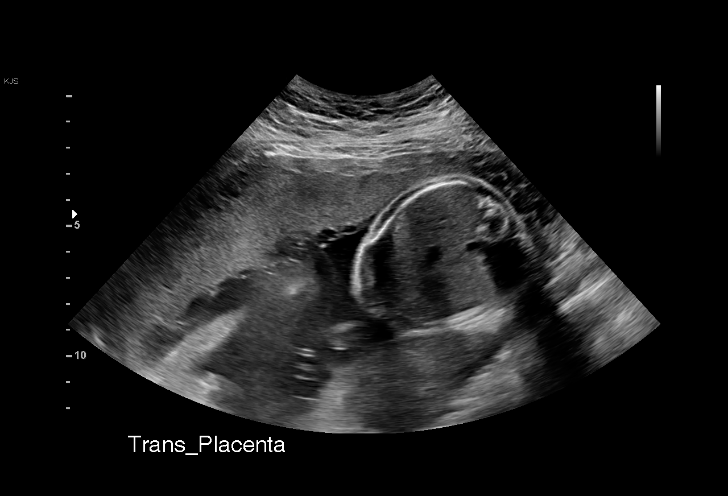
[im 48/59]
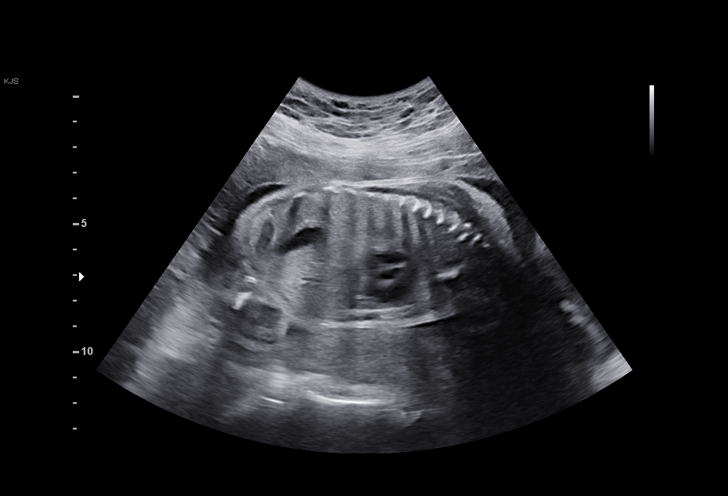
[im 52/59]
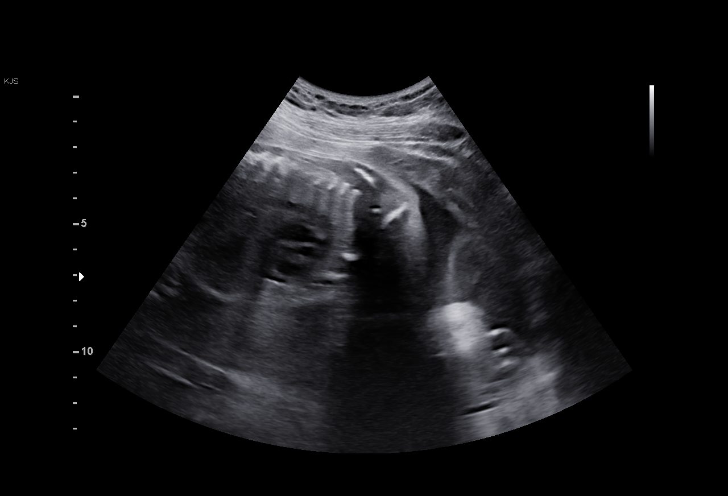
[im 56/59]
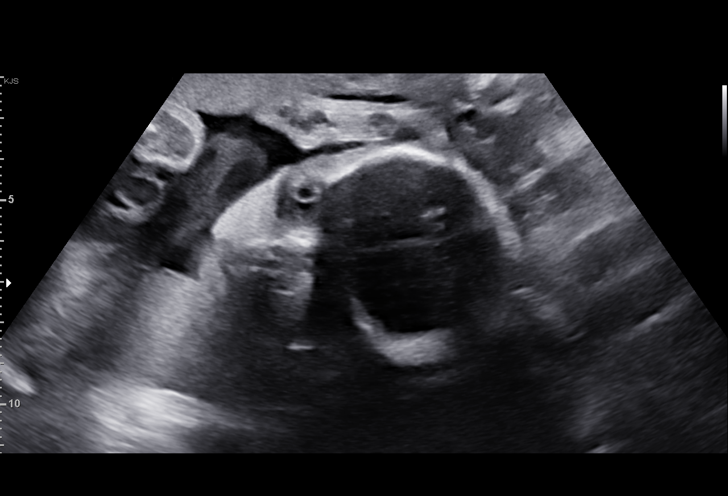

[13 of 28 positions shown; findings below may reference images not displayed]

Women's
                   NICASIO CNM

                                                      NICASIO

Indications

 Obesity complicating pregnancy, second         [HY]
 trimester (BMI 35)
 Tobacco use complicating pregnancy,            [HY]
 second trimester
 Drug use complicating pregnancy, second        [HY]
 trimester (THC)
 Asthma                                         [HY] [HY]
 Medical complication of pregnancy (History     [HY]
 of Postpartum Hemorrhage)
 26 weeks gestation of pregnancy
 LR NIPS
Fetal Evaluation

 Num Of Fetuses:         1
 Fetal Heart Rate(bpm):  147
 Cardiac Activity:       Observed
 Presentation:           Cephalic
 Placenta:               Anterior
 P. Cord Insertion:      Visualized, central

 Amniotic Fluid
 AFI FV:      Within normal limits

                             Largest Pocket(cm)


 Comment:    Low-lying placenta appears to be resolved.
Biometry

 BPD:      68.2  mm     G. Age:  27w 3d         69  %    CI:        73.33   %    70 - 86
                                                         FL/HC:      20.4   %    18.6 -
 HC:      253.1  mm     G. Age:  27w 3d         55  %    HC/AC:      1.21        1.05 -
 AC:      208.8  mm     G. Age:  25w 3d         12  %    FL/BPD:     75.7   %    71 - 87
 FL:       51.6  mm     G. Age:  27w 4d         66  %    FL/AC:      24.7   %    20 - 24
 HUM:      46.9  mm     G. Age:  27w 4d         70  %
 LV:        3.4  mm

 Est. FW:     946  gm      2 lb 1 oz     36  %
OB History

 Blood Type:   O+
 Gravidity:    8         Term:   5        Prem:   0        SAB:   2
 TOP:          0       Ectopic:  0        Living: 5
Gestational Age

 U/S Today:     27w 0d                                        EDD:   [DATE]
 Best:          26w 4d     Det. By:  Early Ultrasound         EDD:   [DATE]
                                     ([DATE])
Anatomy

 Cranium:               Appears normal         LVOT:                   Previously seen
 Cavum:                 Appears normal         Aortic Arch:            Previously seen
 Ventricles:            Appears normal         Ductal Arch:            Previously seen
 Choroid Plexus:        Previously seen        Diaphragm:              Appears normal
 Cerebellum:            Previously seen        Stomach:                Appears normal, left
                                                                       sided
 Posterior Fossa:       Previously seen        Abdomen:                Appears normal
 Nuchal Fold:           Not applicable (>20    Abdominal Wall:         Previously seen
                        wks GA)
 Face:                  Orbits and profile     Cord Vessels:           Previously seen
                        previously seen
 Lips:                  Previously seen        Kidneys:                Appear normal
 Palate:                Appears normal         Bladder:                Appears normal
 Thoracic:              Appears normal         Spine:                  Previously seen
 Heart:                 Appears normal         Upper Extremities:      Previously seen
                        (4CH, axis, and
                        situs)
 RVOT:                  Appears normal         Lower Extremities:      Previously seen

 Other:  Fetus appears to be a male. Nasal bone, Heels/feet and open
         hands/5th digits, VC, 3VV and 3VTV previously visualized.
         Technically difficult due to fetal position.
Cervix Uterus Adnexa

 Cervix
 Length:           3.39  cm.
 Normal appearance by transabdominal scan.

 Uterus
 No abnormality visualized.
 Right Ovary
 No adnexal mass visualized.

 Left Ovary
 No adnexal mass visualized.

 Cul De Sac
 No free fluid seen.

 Adnexa
 No adnexal mass visualized.
Comments

 The patient has been followed for placenta previa/low-lying
 placenta that was noted during her prior exam and due to
 maternal obesity. She denies any problems since her last
 visit.
 The fetal growth and amniotic fluid level were appropriate for
 her gestational age
 The previously noted placenta previa/low-lying placenta has
 resolved.
 The patient was advised that she may continue routine
 prenatal care and attempt a vaginal delivery at term should
 she desire.
 Due to maternal obesity, a follow-up growth scan scheduled
 in 6 weeks.

## 2020-12-08 ENCOUNTER — Other Ambulatory Visit: Payer: Self-pay

## 2020-12-08 ENCOUNTER — Other Ambulatory Visit: Payer: Medicaid Other

## 2020-12-08 ENCOUNTER — Ambulatory Visit (INDEPENDENT_AMBULATORY_CARE_PROVIDER_SITE_OTHER): Payer: Medicaid Other | Admitting: Certified Nurse Midwife

## 2020-12-08 VITALS — BP 122/68 | HR 82 | Wt 231.5 lb

## 2020-12-08 DIAGNOSIS — Z23 Encounter for immunization: Secondary | ICD-10-CM

## 2020-12-08 DIAGNOSIS — Z3A28 28 weeks gestation of pregnancy: Secondary | ICD-10-CM

## 2020-12-08 DIAGNOSIS — Z3483 Encounter for supervision of other normal pregnancy, third trimester: Secondary | ICD-10-CM

## 2020-12-08 MED ORDER — TETANUS-DIPHTH-ACELL PERTUSSIS 5-2.5-18.5 LF-MCG/0.5 IM SUSY
0.5000 mL | PREFILLED_SYRINGE | Freq: Once | INTRAMUSCULAR | Status: AC
  Administered 2020-12-08: 0.5 mL via INTRAMUSCULAR

## 2020-12-08 NOTE — Progress Notes (Signed)
ROB and glucose screen. Reviewed last u/s , Low lying placenta resolved. She has repeat scheduled 11/23 for growth. TDAp/RSB/BTC completed. Sample birth plan given. Discussed birth control , infromation packet given. She is undecided. Has not decided on a name either. Follow up 2 wk or prn .    Body mass index is 38.52 kg/m.  Doreene Burke, CN M

## 2020-12-09 ENCOUNTER — Other Ambulatory Visit: Payer: Self-pay | Admitting: Certified Nurse Midwife

## 2020-12-09 LAB — CBC
Hematocrit: 29.4 % — ABNORMAL LOW (ref 34.0–46.6)
Hemoglobin: 10.5 g/dL — ABNORMAL LOW (ref 11.1–15.9)
MCH: 29.2 pg (ref 26.6–33.0)
MCHC: 35.7 g/dL (ref 31.5–35.7)
MCV: 82 fL (ref 79–97)
Platelets: 224 10*3/uL (ref 150–450)
RBC: 3.6 x10E6/uL — ABNORMAL LOW (ref 3.77–5.28)
RDW: 13.1 % (ref 11.7–15.4)
WBC: 10.9 10*3/uL — ABNORMAL HIGH (ref 3.4–10.8)

## 2020-12-09 LAB — GLUCOSE, 1 HOUR GESTATIONAL: Gestational Diabetes Screen: 125 mg/dL (ref 70–139)

## 2020-12-09 LAB — RPR: RPR Ser Ql: NONREACTIVE

## 2020-12-09 MED ORDER — FUSION PLUS PO CAPS: 1.0000 | ORAL_CAPSULE | Freq: Every day | ORAL | 6 refills | Status: DC

## 2020-12-11 ENCOUNTER — Telehealth: Payer: Self-pay | Admitting: Certified Nurse Midwife

## 2020-12-11 NOTE — Telephone Encounter (Signed)
Attempted to call pt., line stated number listed is not accepting calls at this time.

## 2020-12-11 NOTE — Telephone Encounter (Signed)
Pt called stating that her insurance does not cover the iron rx sent in. Please Advise.

## 2020-12-15 ENCOUNTER — Other Ambulatory Visit: Payer: Self-pay

## 2020-12-15 ENCOUNTER — Encounter: Payer: Self-pay | Admitting: Emergency Medicine

## 2020-12-15 ENCOUNTER — Emergency Department
Admission: EM | Admit: 2020-12-15 | Discharge: 2020-12-15 | Disposition: A | Discharge status: Home / Self Care | Payer: No Typology Code available for payment source | Attending: Emergency Medicine | Admitting: Emergency Medicine

## 2020-12-15 DIAGNOSIS — Z7982 Long term (current) use of aspirin: Secondary | ICD-10-CM | POA: Insufficient documentation

## 2020-12-15 DIAGNOSIS — J45909 Unspecified asthma, uncomplicated: Secondary | ICD-10-CM | POA: Insufficient documentation

## 2020-12-15 DIAGNOSIS — F17210 Nicotine dependence, cigarettes, uncomplicated: Secondary | ICD-10-CM | POA: Insufficient documentation

## 2020-12-15 DIAGNOSIS — O26893 Other specified pregnancy related conditions, third trimester: Secondary | ICD-10-CM | POA: Insufficient documentation

## 2020-12-15 DIAGNOSIS — Z3A30 30 weeks gestation of pregnancy: Secondary | ICD-10-CM | POA: Diagnosis not present

## 2020-12-15 DIAGNOSIS — R109 Unspecified abdominal pain: Secondary | ICD-10-CM | POA: Diagnosis not present

## 2020-12-15 DIAGNOSIS — O99333 Smoking (tobacco) complicating pregnancy, third trimester: Secondary | ICD-10-CM | POA: Insufficient documentation

## 2020-12-15 DIAGNOSIS — O99513 Diseases of the respiratory system complicating pregnancy, third trimester: Secondary | ICD-10-CM | POA: Insufficient documentation

## 2020-12-15 DIAGNOSIS — W19XXXA Unspecified fall, initial encounter: Secondary | ICD-10-CM

## 2020-12-15 MED ORDER — FLUTICASONE PROPIONATE HFA 110 MCG/ACT IN AERO: INHALATION_SPRAY | RESPIRATORY_TRACT | 1 refills | Status: DC

## 2020-12-15 NOTE — ED Triage Notes (Signed)
See triage note, pt reports trip and fall at work this morning. 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 High risk for placenta previa

## 2020-12-15 NOTE — ED Notes (Signed)
Pt states she 'slipped on some plexiglass' at work this morning and landed on her right knee. States pain in right knee and rt side abdomen.

## 2020-12-15 NOTE — ED Provider Notes (Signed)
[facility] Emergency Department Provider Note  Time seen: 10:56 AM  I have reviewed the triage vital signs and the nursing notes.   HISTORY  Chief Complaint Fall   HPI Brittany [name] is a 30 y.o. female with a past medical history of asthma, presents to the emergency department approximate [redacted] weeks pregnant after a fall at work.  According to the patient she was walking at her work around 630 this morning when there was a piece of clear plexiglass on the ground causing her to slip.  Patient states she caught her self on her left leg but her abdomen hit her leg.  Patient did not fall to the ground.  Patient was having some abdominal discomfort and being [redacted] weeks pregnant she was concerned so she came to the emergency department for evaluation.  Denies any vaginal bleeding fluid leakage or discharge.   Past Medical History:  Diagnosis Date   Asthma    Chlamydia    Gonorrhea    Hx MRSA infection 02/16/2003   Trichimoniasis     Patient Active Problem List   Diagnosis Date Noted   History of postpartum hemorrhage 05/18/2019   Asthma 02/25/2018   Pregnant 12/01/2011    Past Surgical History:  Procedure Laterality Date   MULTIPLE TOOTH EXTRACTIONS      Prior to Admission medications   Medication Sig Start Date End Date Taking? Authorizing Provider  Iron-FA-B Cmp-C-Biot-Probiotic (FUSION PLUS) CAPS Take 1 tablet by mouth daily. 12/09/20   Doreene Burke, CNM  aspirin EC 81 MG tablet Take 1 tablet (81 mg total) by mouth daily. Swallow whole. Start daily at [redacted] wks pregnant 08/12/20   Doreene Burke, CNM  fluticasone [facility] HFA) 110 MCG/ACT inhaler INHALE 1 PUFF INTO THE LUNGS TWICE A DAY 12/15/20   Doreene Burke, CNM  Prenatal Multivit-Min-Fe-FA (PRE-NATAL PO) Take by mouth.    [provider]  PROAIR HFA [phone] Base) MCG/ACT inhaler INHALE 2 PUFFS BY MOUTH EVERY 4 HOURS AS NEEDED FOR WHEEZE OR FOR SHORTNESS OF BREATH 11/18/20   Doreene Burke, CNM    Allergies  Allergen Reactions   Iodine Other (See Comments)    blistering   Sulfonamide Derivatives Other (See Comments)    blistering    Family History  Problem Relation Age of Onset   Diabetes Maternal Grandmother    Hypertension Father    Heart disease Father    Lung disease Father    Healthy Mother    Anesthesia problems Neg Hx    Other Neg Hx     Social History Social History   Tobacco Use   Smoking status: Every Day    Packs/day: 0.25    Years: 5.00    Pack years: 1.25    Types: Cigarettes    Last attempt to quit: 12/27/2012    Years since quitting: 7.9   Smokeless tobacco: Never  Vaping Use   Vaping Use: Never used  Substance Use Topics   Alcohol use: No   Drug use: Not Currently    Types: Marijuana, Other-see comments    Comment: Uses CBD    Review of Systems Constitutional: Negative for fever. Cardiovascular: Negative for chest pain. Respiratory: Negative for shortness of breath. Gastrointestinal: Mild abdominal pain.  Negative for vomiting or diarrhea. Genitourinary: Negative for fluid leakage, vaginal bleeding or discharge Musculoskeletal: Negative for musculoskeletal complaints Neurological: Negative for headache All other ROS negative  ____________________________________________   PHYSICAL EXAM:  VITAL SIGNS: ED Triage Vitals  Enc Vitals Group     BP 12/15/20 0917 116/84     Pulse Rate 12/15/20 0917 91     Resp 12/15/20 0917 18     Temp 12/15/20 0917 98.6 F (37 C)     Temp Source 12/15/20 0917 Oral     SpO2 12/15/20 0917 96 %     Weight 12/15/20 0900 231 lb 7.7 oz (105 kg)     Height --      Head Circumference --      Peak Flow --      Pain Score 12/15/20 0859 7     Pain Loc --      Pain Edu? --      Excl. in GC? --     Constitutional: Alert and oriented. Well appearing and in no distress. Eyes: Normal exam ENT      Head: Normocephalic and atraumatic.      Mouth/Throat: Mucous membranes are  moist. Cardiovascular: Normal rate, regular rhythm.  Respiratory: Normal respiratory effort without tachypnea nor retractions. Breath sounds are clear  Gastrointestinal: Soft and nontender. No distention.   Musculoskeletal: Nontender with normal range of motion in all extremities. Neurologic:  Normal speech and language. No gross focal neurologic deficits  Skin:  Skin is warm, dry and intact.  Psychiatric: Mood and affect are normal.   ____________________________________________    INITIAL IMPRESSION / ASSESSMENT AND PLAN / ED COURSE  Pertinent labs & imaging results that were available during my care of the patient were reviewed by me and considered in my medical decision making (see chart for details).   Patient presents to the emergency department after a fall at work.  Patient's abdomen hit her left leg.  No bruising.  Benign abdominal exam.  Bedside ultrasound is normal.  Normal heart rate.  Normal amount of amniotic fluid.  Good fetal movement.  Given the patient's reassuring work-up I believe she is safe for discharge home with Tylenol and OB follow-up.  [name] was evaluated in Emergency Department on 12/15/2020 for the symptoms described in the history of present illness. She was evaluated in the context of the global COVID-19 pandemic, which necessitated consideration that the patient might be at risk for infection with the SARS-CoV-2 virus that causes COVID-19. Institutional protocols and algorithms that pertain to the evaluation of patients at risk for COVID-19 are in a state of rapid change based on information released by regulatory bodies including the CDC and federal and state organizations. These policies and algorithms were followed during the patient's care in the ED.  ____________________________________________   FINAL CLINICAL IMPRESSION(S) / ED DIAGNOSES  [name], MD 12/15/20 1115

## 2020-12-15 NOTE — ED Triage Notes (Signed)
Fall at work.  Slipped on a piece of plexiglass.  States landed on left knee and abdomen hit leg.  C/O knee and abdominal pain.  AAOx3.  Skin warm and dry. NAD

## 2020-12-15 NOTE — ED Notes (Signed)
Pt states that while she was at work she stepped on a piece of plexi glass and slid, states that her left leg went behind her and she fell forward on the right knee and abd. Pt states that she is [redacted] weeks pregnant and her belly is hurting, pt states the baby is moving

## 2020-12-22 ENCOUNTER — Ambulatory Visit (INDEPENDENT_AMBULATORY_CARE_PROVIDER_SITE_OTHER): Payer: Medicaid Other | Admitting: Certified Nurse Midwife

## 2020-12-22 ENCOUNTER — Other Ambulatory Visit: Payer: Self-pay

## 2020-12-22 VITALS — BP 123/78 | HR 98 | Wt 232.4 lb

## 2020-12-22 DIAGNOSIS — Z3A30 30 weeks gestation of pregnancy: Secondary | ICD-10-CM

## 2020-12-22 DIAGNOSIS — Z3483 Encounter for supervision of other normal pregnancy, third trimester: Secondary | ICD-10-CM

## 2020-12-22 LAB — POCT URINALYSIS DIPSTICK OB
Bilirubin, UA: NEGATIVE
Blood, UA: NEGATIVE
Glucose, UA: NEGATIVE
Ketones, UA: NEGATIVE
Leukocytes, UA: NEGATIVE
Nitrite, UA: NEGATIVE
POC,PROTEIN,UA: NEGATIVE
Spec Grav, UA: 1.015 (ref 1.010–1.025)
Urobilinogen, UA: 0.2 E.U./dL
pH, UA: 6.5 (ref 5.0–8.0)

## 2020-12-22 NOTE — Progress Notes (Signed)
Body mass index is 38.67 kg/m.   ROB doing well, feeling good movement. Pt is interested in water birth. Encouraged her to take class. She verbalizes and agrees. Discussed note for work restrictions, currently working 50-60 hrs a week. Discussed limiting hours if need. She will let me know. She has MFM u/s scheduled for growth 11/23. Follow up rob in 2 wk.   Doreene Burke, CNM

## 2020-12-22 NOTE — Patient Instructions (Signed)
[facility]  [facility]  [address], [place], [place] [address]  Phone: [phone]  [facility] (second location)  [address] [place], [place] [address]  Phone: [phone]  [facility] ([place]) [address], [place], [place] [address] Phone: [phone]  [facility]  [address] [place], [place] [address]  Phone: [phone] 

## 2021-01-02 ENCOUNTER — Encounter: Payer: Medicaid Other | Admitting: Certified Nurse Midwife

## 2021-01-05 ENCOUNTER — Other Ambulatory Visit: Payer: Self-pay

## 2021-01-05 ENCOUNTER — Ambulatory Visit (INDEPENDENT_AMBULATORY_CARE_PROVIDER_SITE_OTHER): Payer: Medicaid Other | Admitting: Certified Nurse Midwife

## 2021-01-05 ENCOUNTER — Encounter: Payer: Self-pay | Admitting: Certified Nurse Midwife

## 2021-01-05 VITALS — BP 126/81 | HR 100 | Wt 230.9 lb

## 2021-01-05 DIAGNOSIS — Z3483 Encounter for supervision of other normal pregnancy, third trimester: Secondary | ICD-10-CM | POA: Diagnosis not present

## 2021-01-05 DIAGNOSIS — Z3A32 32 weeks gestation of pregnancy: Secondary | ICD-10-CM | POA: Diagnosis not present

## 2021-01-05 LAB — POCT URINALYSIS DIPSTICK OB
Bilirubin, UA: NEGATIVE
Blood, UA: NEGATIVE
Glucose, UA: NEGATIVE
Ketones, UA: NEGATIVE
Leukocytes, UA: NEGATIVE
Nitrite, UA: NEGATIVE
POC,PROTEIN,UA: NEGATIVE
Spec Grav, UA: 1.02 (ref 1.010–1.025)
Urobilinogen, UA: 0.2 E.U./dL
pH, UA: 7 (ref 5.0–8.0)

## 2021-01-05 NOTE — Progress Notes (Signed)
Pt left without being seen. She present to office 18 min late. Pt instructed that I could see her after seeing my other patients. She stated she could not wait and left.   Doreene Burke, CNM

## 2021-01-06 ENCOUNTER — Encounter: Payer: Self-pay | Admitting: Certified Nurse Midwife

## 2021-01-07 ENCOUNTER — Other Ambulatory Visit: Payer: Self-pay

## 2021-01-07 ENCOUNTER — Ambulatory Visit: Payer: Medicaid Other | Admitting: *Deleted

## 2021-01-07 ENCOUNTER — Encounter: Payer: Self-pay | Admitting: *Deleted

## 2021-01-07 ENCOUNTER — Ambulatory Visit: Payer: Medicaid Other | Attending: Obstetrics

## 2021-01-07 VITALS — BP 128/82 | HR 87

## 2021-01-07 DIAGNOSIS — O99323 Drug use complicating pregnancy, third trimester: Secondary | ICD-10-CM | POA: Diagnosis not present

## 2021-01-07 DIAGNOSIS — O99213 Obesity complicating pregnancy, third trimester: Secondary | ICD-10-CM | POA: Diagnosis present

## 2021-01-07 DIAGNOSIS — F1290 Cannabis use, unspecified, uncomplicated: Secondary | ICD-10-CM | POA: Insufficient documentation

## 2021-01-07 DIAGNOSIS — Z6835 Body mass index (BMI) 35.0-35.9, adult: Secondary | ICD-10-CM

## 2021-01-07 DIAGNOSIS — Z3A32 32 weeks gestation of pregnancy: Secondary | ICD-10-CM | POA: Diagnosis not present

## 2021-01-07 DIAGNOSIS — O99332 Smoking (tobacco) complicating pregnancy, second trimester: Secondary | ICD-10-CM

## 2021-01-07 DIAGNOSIS — O99320 Drug use complicating pregnancy, unspecified trimester: Secondary | ICD-10-CM

## 2021-01-07 DIAGNOSIS — O99333 Smoking (tobacco) complicating pregnancy, third trimester: Secondary | ICD-10-CM | POA: Diagnosis not present

## 2021-01-07 IMAGING — US US MFM OB FOLLOW-UP
1 series · 14 of 28 positions shown · non-contrast
Comparison: none

[Series 1: us mfm ob follow-up · 45 acquisitions, 14 frames shown]
[im 2/45]
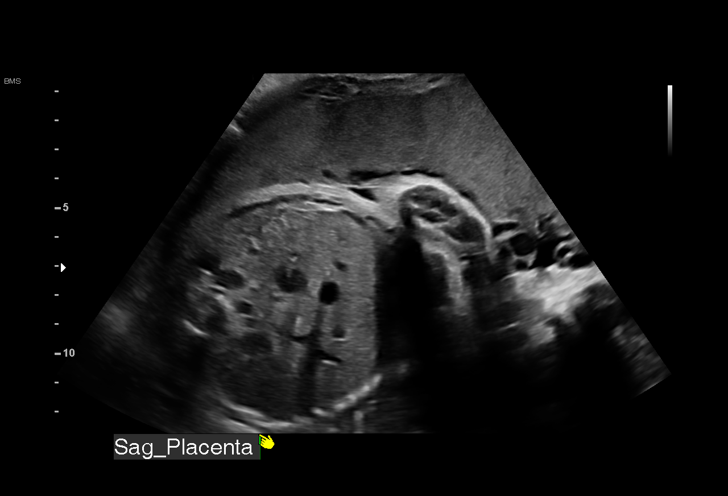
[im 5/45]
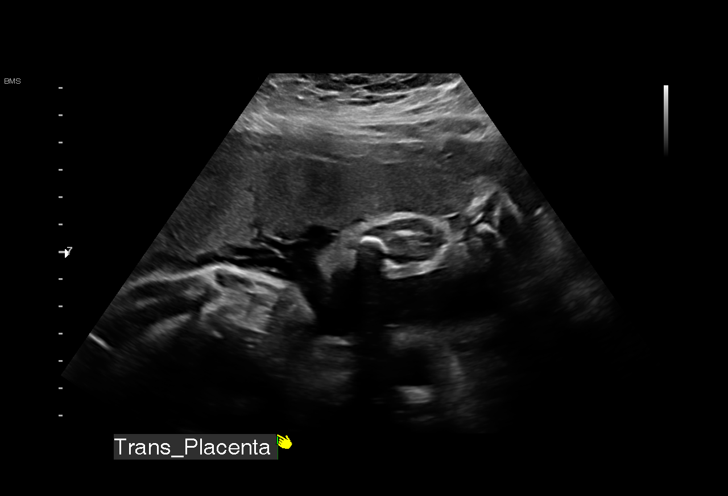
[im 9/45]
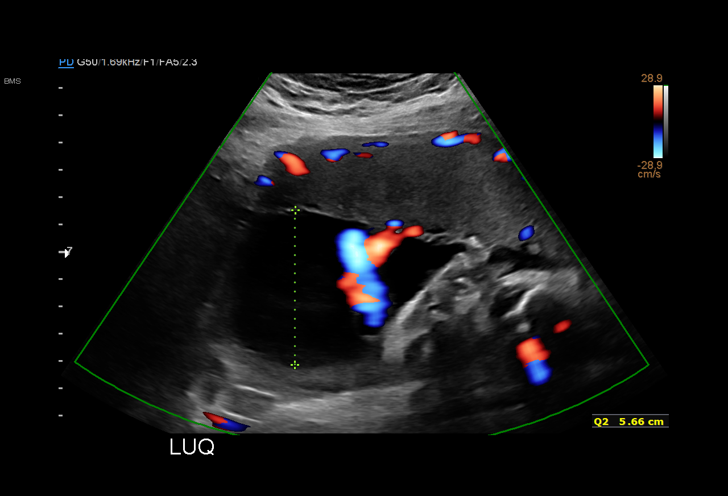
[im 12/45]
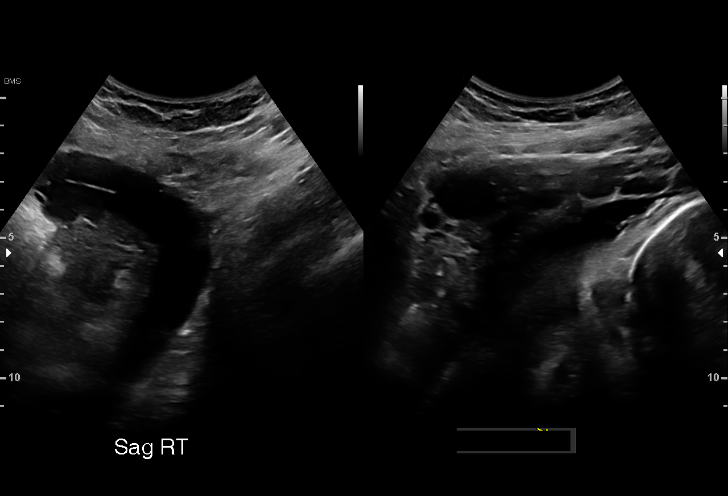
[im 15/45]
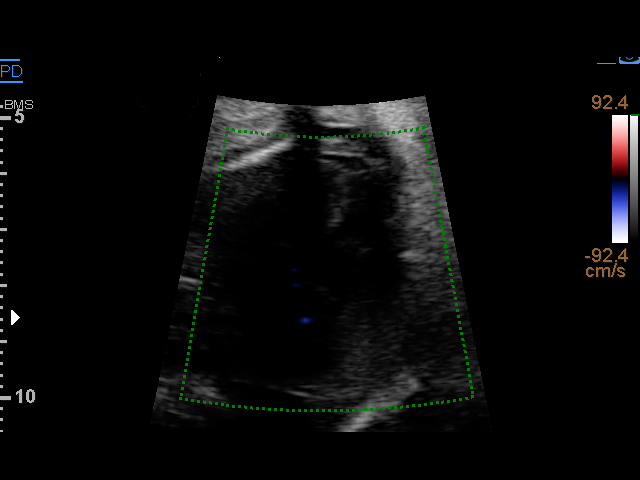
[im 18/45]
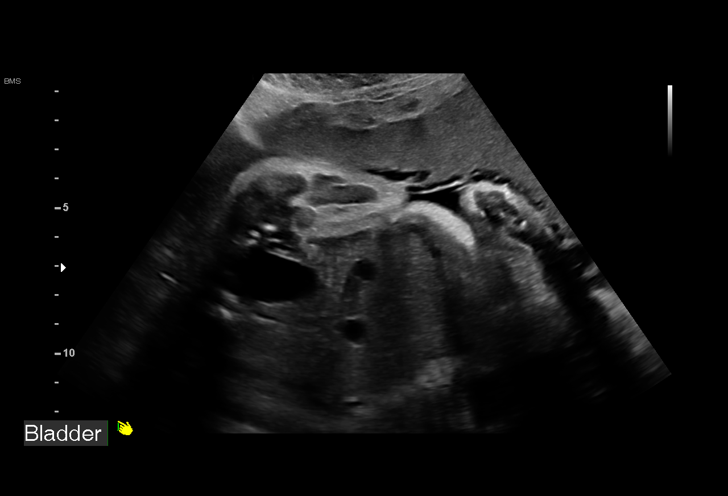
[im 22/45]
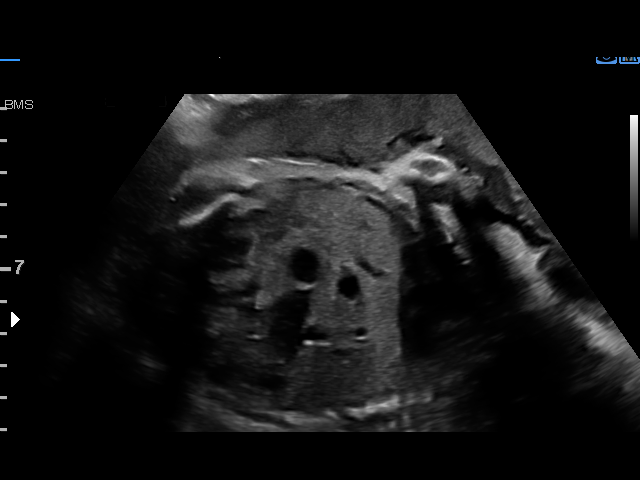
[im 25/45]
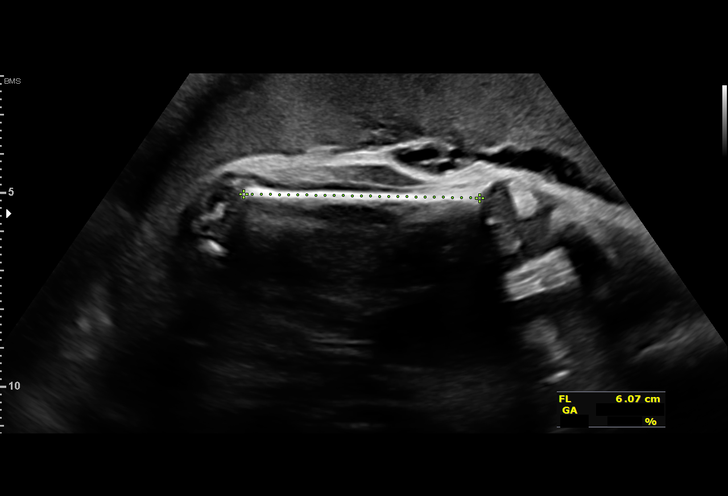
[im 28/45]
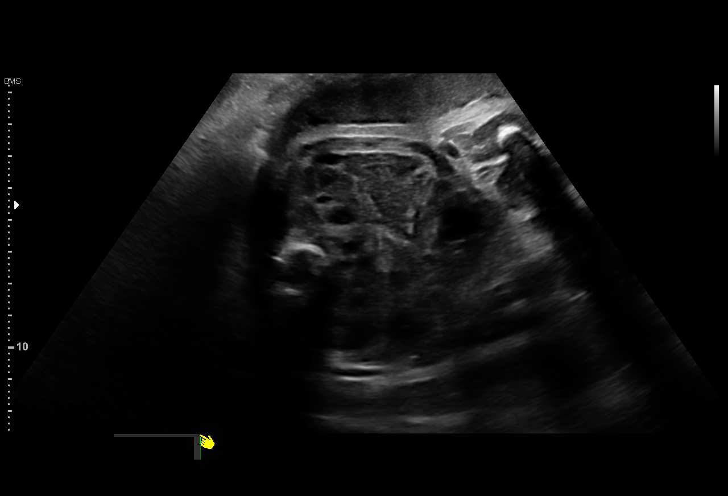
[im 31/45]
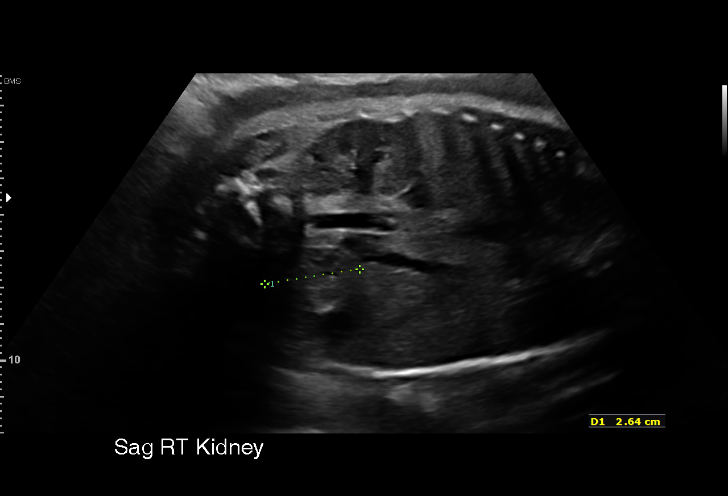
[im 35/45]
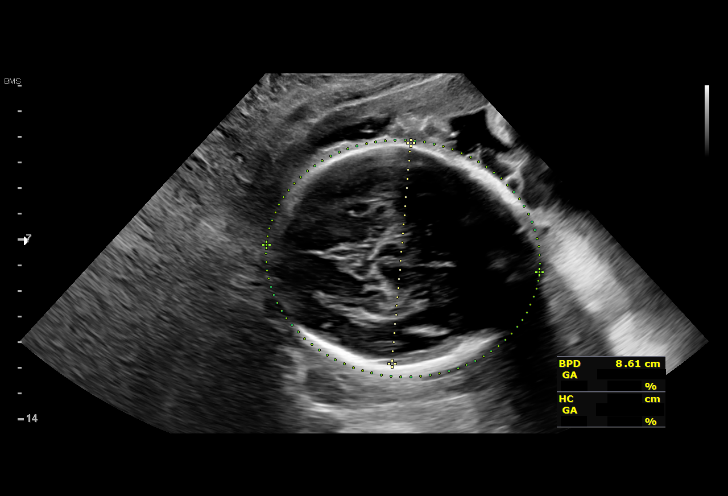
[im 38/45]
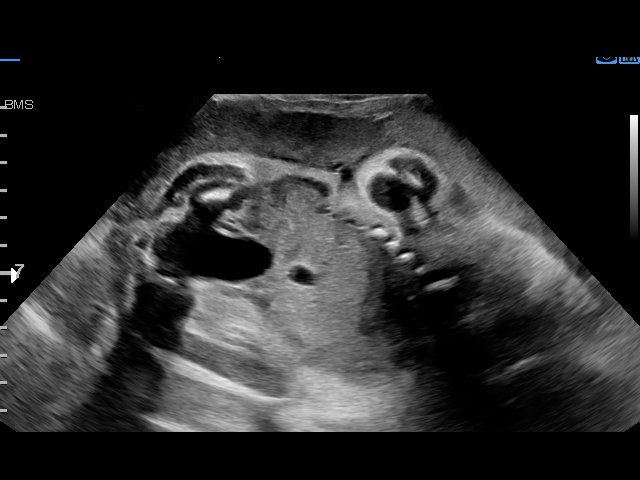
[im 41/45]
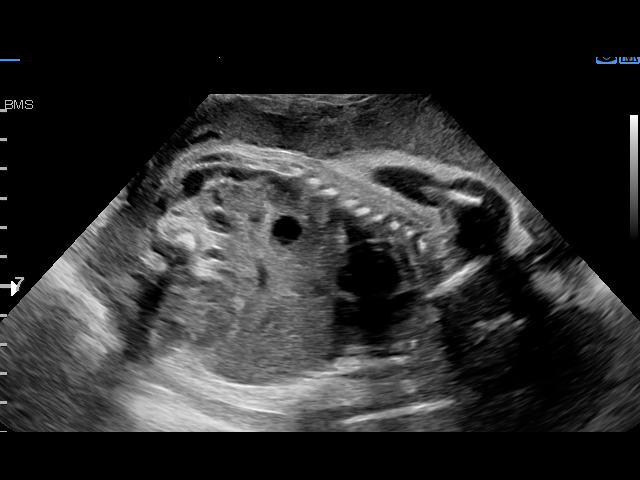
[im 45/45]
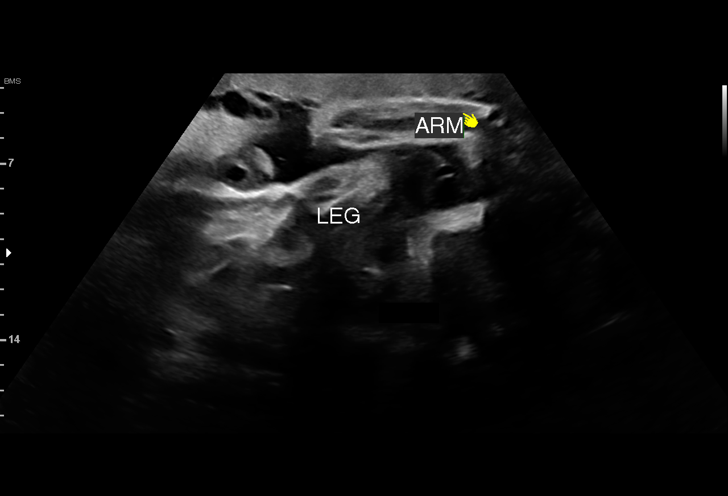

[14 of 28 positions shown; findings below may reference images not displayed]

Women's
                   DIKE CNM

Indications

 Obesity complicating pregnancy, second         [4T]
 trimester (BMI 35)
 Tobacco use complicating pregnancy,            [4T]
 second trimester
 Drug use complicating pregnancy, second        [4T]
 trimester (THC)
 Asthma                                         [4T] [4T]
 Medical complication of pregnancy (History     [4T]
 of Postpartum Hemorrhage)
 LR NIPS
 32 weeks gestation of pregnancy
Fetal Evaluation

 Num Of Fetuses:         1
 Fetal Heart Rate(bpm):  136
 Cardiac Activity:       Observed
 Presentation:           Cephalic
 Placenta:               Anterior
 P. Cord Insertion:      Previously Visualized

 Amniotic Fluid
 AFI FV:      Within normal limits

 AFI Sum(cm)     %Tile       Largest Pocket(cm)
 12.67           37

 RUQ(cm)       RLQ(cm)       LUQ(cm)        LLQ(cm)

Biometry

 BPD:      85.6  mm     G. Age:  34w 4d         91  %    CI:        74.91   %    70 - 86
                                                         FL/HC:      19.6   %    19.9 -
 HC:      313.8  mm     G. Age:  35w 1d         80  %    HC/AC:      1.10        0.96 -
 AC:      284.2  mm     G. Age:  32w 3d         46  %    FL/BPD:     71.8   %    71 - 87
 FL:       61.5  mm     G. Age:  31w 6d         21  %    FL/AC:      21.6   %    20 - 24

 Est. FW:    [4T]  gm      4 lb 8 oz     45  %
OB History

 Blood Type:   O+
 Gravidity:    8         Term:   5        Prem:   0        SAB:   2
 TOP:          0       Ectopic:  0        Living: 5
Gestational Age

 U/S Today:     33w 4d                                        EDD:   [DATE]
 Best:          32w 4d     Det. By:  Early Ultrasound         EDD:   [DATE]
                                     ([DATE])
Anatomy

 Cranium:               Appears normal         LVOT:                   Previously seen
 Cavum:                 Appears normal         Aortic Arch:            Previously seen
 Ventricles:            Appears normal         Ductal Arch:            Previously seen
 Choroid Plexus:        Previously seen        Diaphragm:              Appears normal
 Cerebellum:            Previously seen        Stomach:                Appears normal, left
                                                                       sided
 Posterior Fossa:       Previously seen        Abdomen:                Appears normal
 Nuchal Fold:           Not applicable (>20    Abdominal Wall:         Previously seen
                        wks GA)
 Face:                  Orbits and profile     Cord Vessels:           Previously seen
                        previously seen
 Lips:                  Previously seen        Kidneys:                Appear normal
 Palate:                Previously seen        Bladder:                Appears normal
 Thoracic:              Previously seen        Spine:                  Previously seen
 Heart:                 Previously seen        Upper Extremities:      Previously seen
 RVOT:                  Previously seen        Lower Extremities:      Previously seen

 Other:  Fetus appears to be a male. Nasal bone, Heels/feet and open
         hands/5th digits, VC, 3VV and 3VTV previously visualized.
         Technically difficult due to fetal position.
Cervix Uterus Adnexa

 Cervix
 Not visualized (advanced GA >[4T])

 Uterus
 No abnormality visualized.

 Right Ovary
 Not visualized.
 Left Ovary
 Not visualized.

 Cul De Sac
 No free fluid seen.

 Adnexa
 No adnexal mass visualized.
Impression

 Follow up growth due to elevated BMI
 Normal interval growth with measurements consistent with
 dates
 Good fetal movement and amniotic fluid volume
Recommendations

 Follow up as clinically indicated.

## 2021-01-11 ENCOUNTER — Other Ambulatory Visit: Payer: Self-pay | Admitting: Certified Nurse Midwife

## 2021-01-11 ENCOUNTER — Encounter: Payer: Self-pay | Admitting: Obstetrics and Gynecology

## 2021-01-11 ENCOUNTER — Other Ambulatory Visit: Payer: Self-pay

## 2021-01-11 ENCOUNTER — Observation Stay
Admission: EM | Admit: 2021-01-11 | Discharge: 2021-01-11 | Disposition: A | Discharge status: Home / Self Care | Payer: Medicaid Other | Attending: Certified Nurse Midwife | Admitting: Certified Nurse Midwife

## 2021-01-11 DIAGNOSIS — R102 Pelvic and perineal pain: Secondary | ICD-10-CM | POA: Diagnosis not present

## 2021-01-11 DIAGNOSIS — M549 Dorsalgia, unspecified: Secondary | ICD-10-CM | POA: Diagnosis not present

## 2021-01-11 DIAGNOSIS — O23593 Infection of other part of genital tract in pregnancy, third trimester: Principal | ICD-10-CM | POA: Insufficient documentation

## 2021-01-11 DIAGNOSIS — Z7982 Long term (current) use of aspirin: Secondary | ICD-10-CM | POA: Diagnosis not present

## 2021-01-11 DIAGNOSIS — Z3A33 33 weeks gestation of pregnancy: Secondary | ICD-10-CM | POA: Insufficient documentation

## 2021-01-11 DIAGNOSIS — O26893 Other specified pregnancy related conditions, third trimester: Secondary | ICD-10-CM | POA: Diagnosis not present

## 2021-01-11 LAB — URINALYSIS, ROUTINE W REFLEX MICROSCOPIC
Bilirubin Urine: NEGATIVE
Glucose, UA: NEGATIVE mg/dL
Hgb urine dipstick: NEGATIVE
Ketones, ur: NEGATIVE mg/dL
Nitrite: NEGATIVE
Protein, ur: NEGATIVE mg/dL
Specific Gravity, Urine: 1.02 (ref 1.005–1.030)
pH: 7 (ref 5.0–8.0)

## 2021-01-11 LAB — URINALYSIS, MICROSCOPIC (REFLEX): Bacteria, UA: NONE SEEN

## 2021-01-11 LAB — WET PREP, GENITAL
Sperm: NONE SEEN
Trich, Wet Prep: NONE SEEN
WBC, Wet Prep HPF POC: <10 (ref <10)
Yeast Wet Prep HPF POC: NONE SEEN

## 2021-01-11 LAB — FETAL FIBRONECTIN: Fetal Fibronectin: NEGATIVE

## 2021-01-11 MED ORDER — CYCLOBENZAPRINE HCL 5 MG PO TABS
7.5000 mg | ORAL_TABLET | Freq: Once | ORAL | Status: AC
  Administered 2021-01-11: 12:00:00 7.5 mg via ORAL
  Filled 2021-01-11: qty 1.5

## 2021-01-11 MED ORDER — METRONIDAZOLE 0.75 % VA GEL: 1.0000 | Freq: Every day | VAGINAL | 0 refills | Status: AC

## 2021-01-11 MED ORDER — ACETAMINOPHEN 500 MG PO TABS
1000.0000 mg | ORAL_TABLET | Freq: Four times a day (QID) | ORAL | Status: DC | PRN
  Administered 2021-01-11: 11:00:00 1000 mg via ORAL
  Filled 2021-01-11: qty 2

## 2021-01-11 MED ORDER — CYCLOBENZAPRINE HCL 5 MG PO TABS: 5.0000 mg | ORAL_TABLET | Freq: Two times a day (BID) | ORAL | 0 refills | Status: DC | PRN

## 2021-01-11 NOTE — Discharge Instructions (Signed)
Please keep your next scheduled appointment.  If you have questions or concerns you may call the office or your on call provider.  If you have urgent concerns please go to the nearest emergency department for evaluation.

## 2021-01-11 NOTE — OB Triage Note (Signed)
L&D OB Triage Note  SUBJECTIVE Brittany [name] is a 30 y.o. [id number] female at [redacted]w[redacted]d, EDD Estimated Date of Delivery: 02/28/21 who presented to triage with complaints of pelvic pressure and back pain. She denies leaking of fluid, vaginal bleeding, contractions, and feels good movement.   OB History  Gravida Para Term Preterm AB Living  8 5 5  0 2 5  SAB IAB Ectopic Multiple Live Births  2 0 0 0 5    # Outcome Date GA Lbr Len/2nd Weight Sex Delivery Anes PTL Lv  8 Current           7 SAB 12/17/19          6 Term 05/18/19 [redacted]w[redacted]d / 00:14 3230 g F Vag-Spont EPI  LIV     Name: GRAYSON, WHITE     Apgar1: 9  Apgar5: 9  5 SAB 2020          4 Term 09/03/13 [redacted]w[redacted]d 02:13 / 00:22 3141 g F Vag-Spont EPI  LIV     Name: OTHELIA, RIEDERER     Apgar1: 9  Apgar5: 9  3 Term 12/07/11 [redacted]w[redacted]d / 00:04 3104 g F Vag-Spont None  LIV     Name: [facility]     Apgar1: 8  Apgar5: 9  2 Term 08/30/10 [redacted]w[redacted]d 03:25 / 12:30 3090 g M Vag-Spont None  LIV     Birth Comments: diabetic- oral meds     Name: [redacted]w[redacted]d     Apgar1: 9  Apgar5: 9  1 Term 06/16/07    M Vag-Spont   LIV    Medications Prior to Admission  Medication Sig Dispense Refill Last Dose   aspirin EC 81 MG tablet Take 1 tablet (81 mg total) by mouth daily. Swallow whole. Start daily at [redacted] wks pregnant 30 tablet 11 Past Week   fluticasone (FLOVENT HFA) 110 MCG/ACT inhaler INHALE 1 PUFF INTO THE LUNGS TWICE A DAY 12 each 1 01/11/2021   Prenatal Multivit-Min-Fe-FA (PRE-NATAL PO) Take by mouth.   Past Week   Iron-FA-B Cmp-C-Biot-Probiotic (FUSION PLUS) CAPS Take 1 tablet by mouth daily. (Patient not taking: Reported on 01/05/2021) 30 capsule 6 Not Taking   PROAIR HFA 108 (90 Base) MCG/ACT inhaler INHALE 2 PUFFS BY MOUTH EVERY 4 HOURS AS NEEDED FOR WHEEZE OR FOR SHORTNESS OF BREATH (Patient not taking: Reported on 01/11/2021) 8.5 each 1 Not Taking     OBJECTIVE  Nursing Evaluation:   BP 121/78 (BP Location: Right  Arm)   Pulse 80   Temp 98.6 F (37 C) (Oral)   Resp 16   Ht 5\' 5"  (1.651 m)   Wt 104.3 kg Comment: 230lbs  LMP  (LMP Unknown)   BMI 38.27 kg/m    Findings:   pelvic pain in pregnancy          Back pain in pregnancy      NST was performed and has been reviewed by me.  NST INTERPRETATION: Category I  Mode: External Baseline Rate (A): 142 bpm (fht) Variability: Moderate Accelerations: 10 x 10, 15 x 15 Decelerations: Variable (x1)     Contraction Frequency (min): none  ASSESSMENT Impression:  1.  Pregnancy:  01/13/2021 at [redacted]w[redacted]d , EDD Estimated Date of Delivery: 02/28/21 2.  Reassuring fetal and maternal status 3.  Pelvic and back pain in pregnancy -Tylenol prn, flexeril prn pain  4.  Bacterial vaginosis- Metrogel ordered  PLAN 1. Current condition and above findings reviewed.  Reassuring fetal and maternal condition. 2.  Discharge home with standard labor precautions given to return to L&D or call the office for problems. 3. Continue routine prenatal care.  Doreene Burke, CNM

## 2021-01-16 ENCOUNTER — Ambulatory Visit (INDEPENDENT_AMBULATORY_CARE_PROVIDER_SITE_OTHER): Payer: Medicaid Other | Admitting: Certified Nurse Midwife

## 2021-01-16 ENCOUNTER — Other Ambulatory Visit: Payer: Self-pay

## 2021-01-16 VITALS — BP 117/81 | HR 102 | Wt 240.8 lb

## 2021-01-16 DIAGNOSIS — Z3483 Encounter for supervision of other normal pregnancy, third trimester: Secondary | ICD-10-CM

## 2021-01-16 DIAGNOSIS — Z3A33 33 weeks gestation of pregnancy: Secondary | ICD-10-CM

## 2021-01-16 LAB — POCT URINALYSIS DIPSTICK OB
Bilirubin, UA: NEGATIVE
Blood, UA: NEGATIVE
Glucose, UA: NEGATIVE
Ketones, UA: NEGATIVE
Leukocytes, UA: NEGATIVE
Nitrite, UA: NEGATIVE
POC,PROTEIN,UA: NEGATIVE
Spec Grav, UA: 1.01 (ref 1.010–1.025)
Urobilinogen, UA: 0.2 E.U./dL
pH, UA: 6 (ref 5.0–8.0)

## 2021-01-16 NOTE — Progress Notes (Signed)
Body mass index is 40.07 kg/m.  ROB , feeling good movement. MFM visit 01/07/21 reviewed, See below.  ---------------------------------------------------------------------- Impression  Follow up growth due to elevated BMI  Normal interval growth with measurements consistent with  dates  Good fetal movement and amniotic fluid volume Growth  Est. FW:    2038  gm      4 lb 8 oz     45  %  Discussed wkly NST @ 36 wks due to elevated BMI, pt in agreement. Discussed induction 39 wks , pt state she never makes it that long but she is open to induction showed she make to 39 wks. Reviewed GBS testing next visit. She agrees to plan.   Follow up 2 wks for ROB , NST with Mds   Doreene Burke, CNM

## 2021-01-19 ENCOUNTER — Other Ambulatory Visit: Payer: Self-pay | Admitting: Certified Nurse Midwife

## 2021-01-19 DIAGNOSIS — O99213 Obesity complicating pregnancy, third trimester: Secondary | ICD-10-CM

## 2021-01-23 ENCOUNTER — Ambulatory Visit: Payer: Self-pay

## 2021-01-23 ENCOUNTER — Ambulatory Visit: Payer: Medicaid Other

## 2021-01-27 ENCOUNTER — Other Ambulatory Visit: Payer: Self-pay

## 2021-01-27 ENCOUNTER — Other Ambulatory Visit: Payer: Medicaid Other

## 2021-01-27 ENCOUNTER — Ambulatory Visit (INDEPENDENT_AMBULATORY_CARE_PROVIDER_SITE_OTHER): Payer: Medicaid Other | Admitting: Obstetrics and Gynecology

## 2021-01-27 VITALS — BP 141/82 | HR 97 | Wt 238.8 lb

## 2021-01-27 DIAGNOSIS — Z3A35 35 weeks gestation of pregnancy: Secondary | ICD-10-CM

## 2021-01-27 DIAGNOSIS — Z3483 Encounter for supervision of other normal pregnancy, third trimester: Secondary | ICD-10-CM | POA: Diagnosis not present

## 2021-01-27 LAB — POCT URINALYSIS DIPSTICK OB
Bilirubin, UA: NEGATIVE
Blood, UA: NEGATIVE
Glucose, UA: NEGATIVE
Ketones, UA: NEGATIVE
Leukocytes, UA: NEGATIVE
Nitrite, UA: NEGATIVE
POC,PROTEIN,UA: NEGATIVE
Spec Grav, UA: 1.01 (ref 1.010–1.025)
Urobilinogen, UA: 0.2 E.U./dL
pH, UA: 7 (ref 5.0–8.0)

## 2021-01-27 NOTE — Progress Notes (Signed)
ROB: Occasional contractions-states that she might have lost her mucous plug.  NST today reactive.  Discussed EDC, patient was under the impression her last ultrasound changed her dates.  She is not particularly happy about keeping her same due date from the first ultrasound.  Needs cultures next week.

## 2021-01-28 ENCOUNTER — Other Ambulatory Visit: Payer: Self-pay | Admitting: Certified Nurse Midwife

## 2021-02-02 ENCOUNTER — Ambulatory Visit: Payer: Medicaid Other | Admitting: *Deleted

## 2021-02-02 ENCOUNTER — Ambulatory Visit: Payer: Medicaid Other | Attending: Certified Nurse Midwife

## 2021-02-02 ENCOUNTER — Encounter: Payer: Self-pay | Admitting: *Deleted

## 2021-02-02 ENCOUNTER — Other Ambulatory Visit: Payer: Self-pay

## 2021-02-02 VITALS — BP 137/94 | HR 91

## 2021-02-02 DIAGNOSIS — O99333 Smoking (tobacco) complicating pregnancy, third trimester: Secondary | ICD-10-CM

## 2021-02-02 DIAGNOSIS — Z6838 Body mass index (BMI) 38.0-38.9, adult: Secondary | ICD-10-CM | POA: Insufficient documentation

## 2021-02-02 DIAGNOSIS — E669 Obesity, unspecified: Secondary | ICD-10-CM | POA: Diagnosis not present

## 2021-02-02 DIAGNOSIS — O99213 Obesity complicating pregnancy, third trimester: Secondary | ICD-10-CM | POA: Insufficient documentation

## 2021-02-02 DIAGNOSIS — Z3A36 36 weeks gestation of pregnancy: Secondary | ICD-10-CM

## 2021-02-02 IMAGING — US US MFM OB FOLLOW-UP
1 series · 13 of 28 positions shown · non-contrast
Comparison: none

[Series 1: us mfm ob follow-up · 13 of 50 slices shown]
[im 2/50]
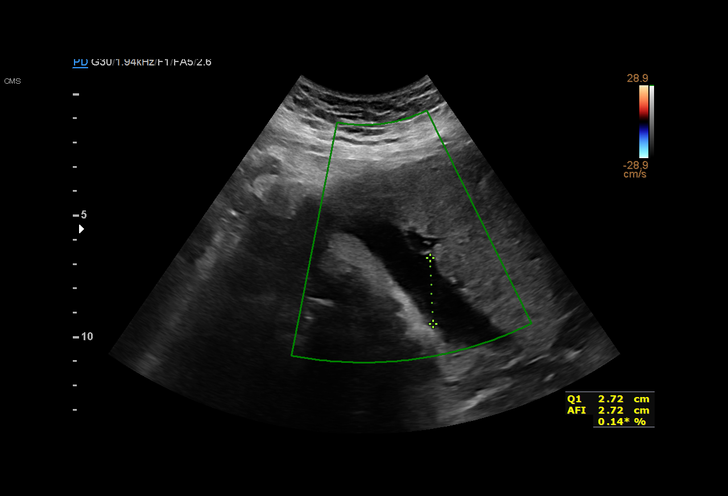
[im 6/50]
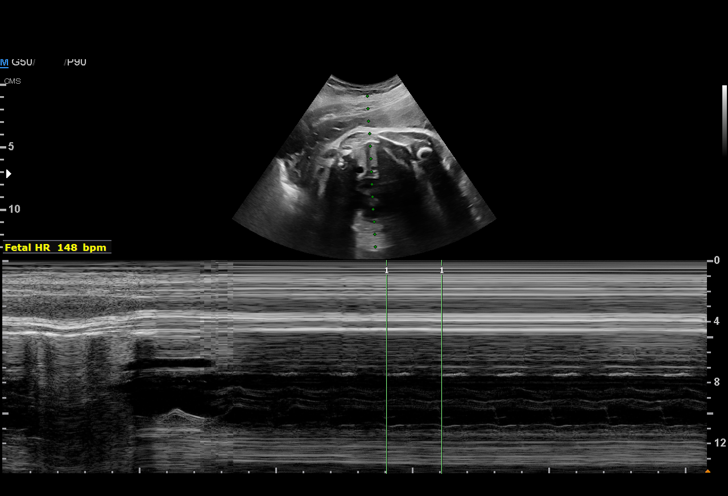
[im 10/50]
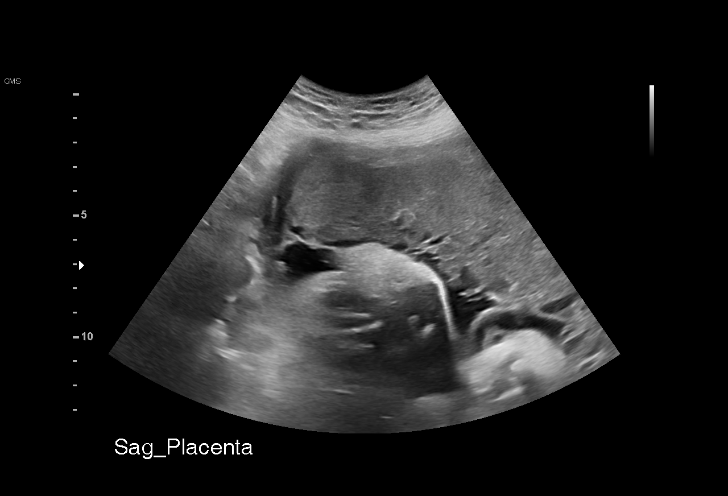
[im 13/50]
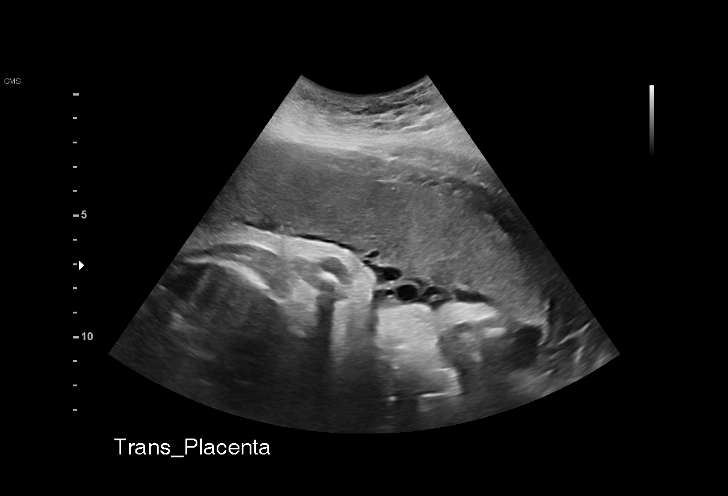
[im 17/50]
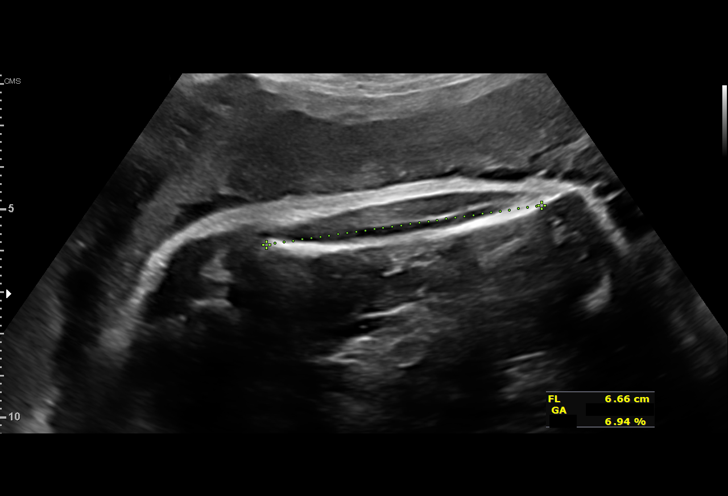
[im 20/50]
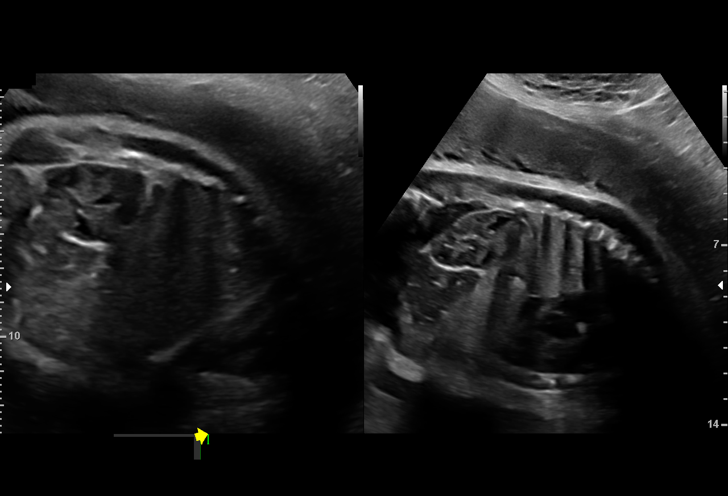
[im 26/50]
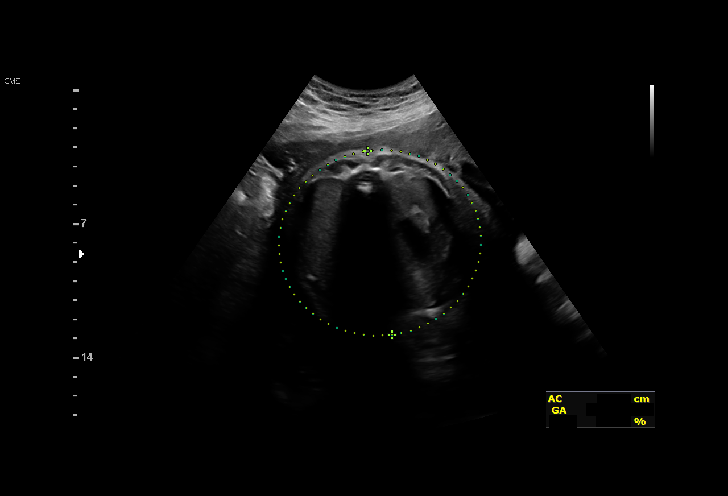
[im 30/50]
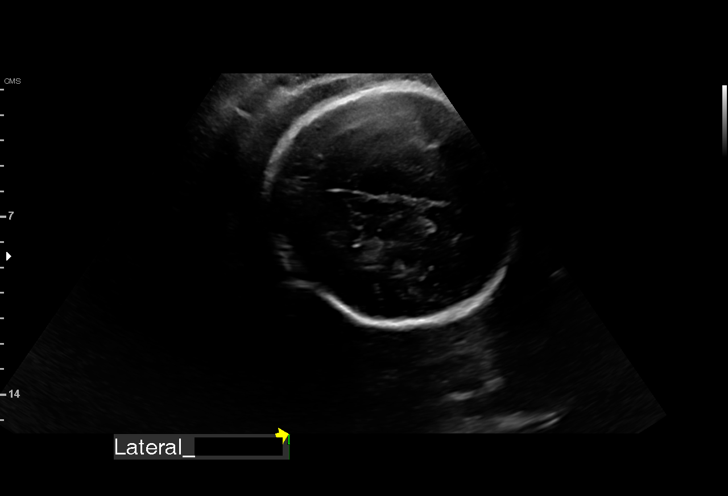
[im 33/50]
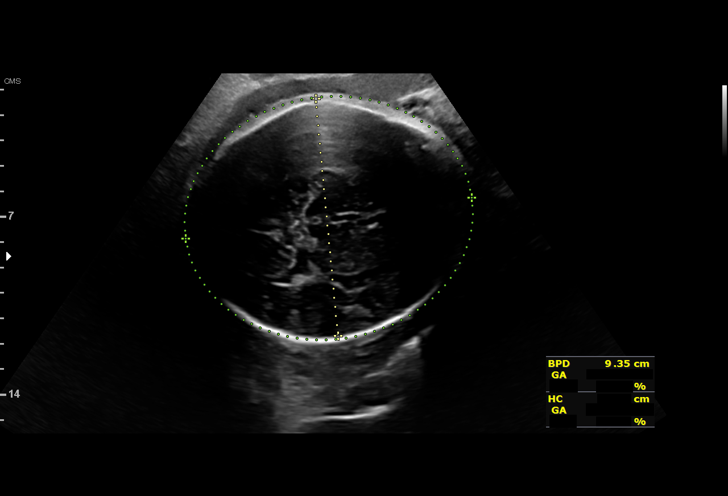
[im 37/50]
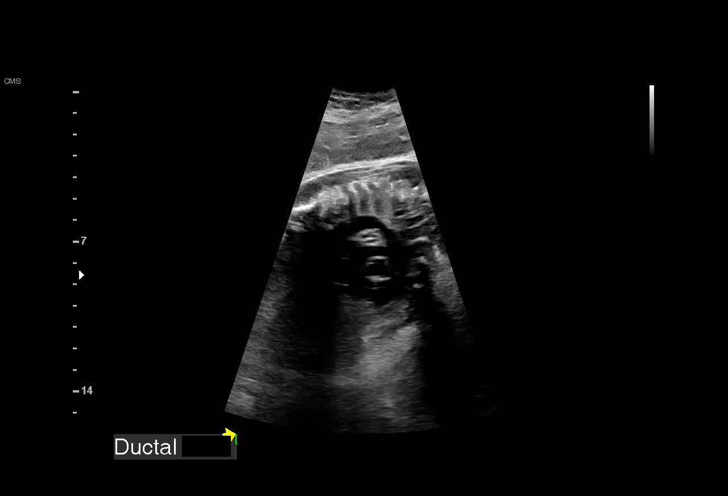
[im 40/50]
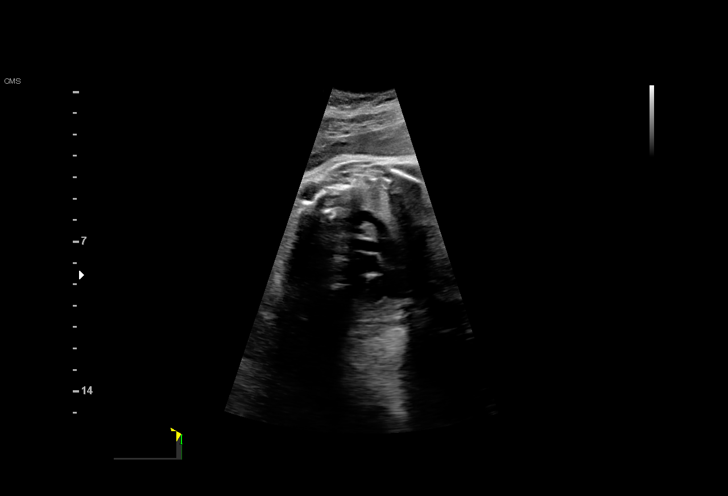
[im 44/50]
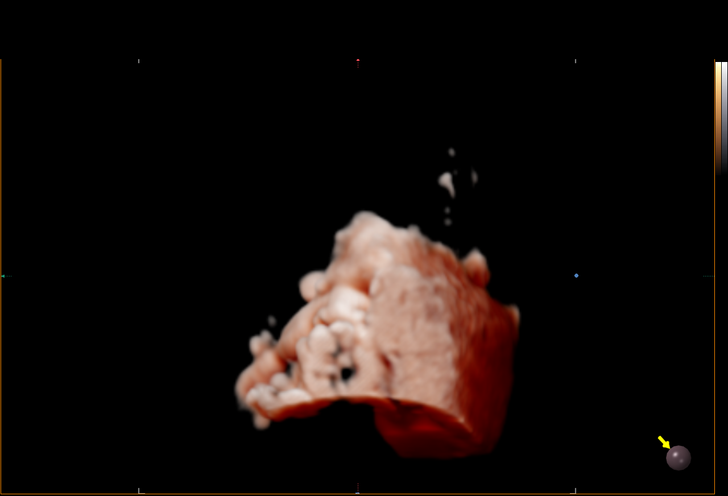
[im 48/50]
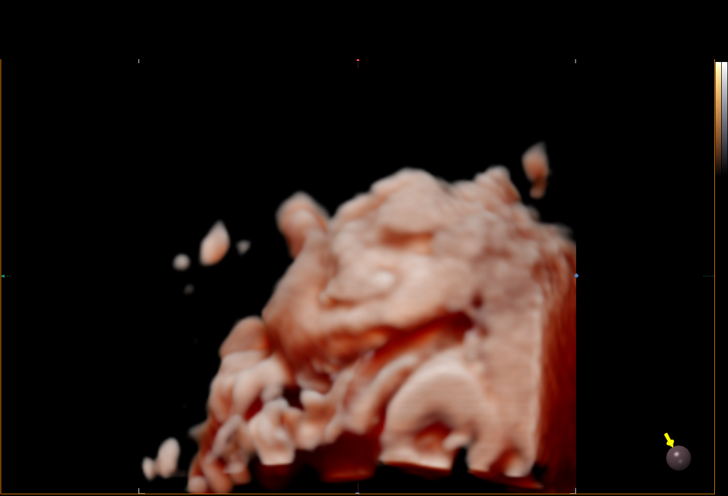

[13 of 28 positions shown; findings below may reference images not displayed]

Women's
                   XYRUZ CNM

Indications

 Obesity complicating pregnancy, third          [GS]
 trimester (pregravid BMI 35)
 Tobacco use complicating pregnancy, third      [GS]
 trimester
 Drug use complicating pregnancy, third         [GS]
 trimester (THC)
 Asthma                                         [GS] [GS]
 Medical complication of pregnancy (History     [GS]
 of Postpartum Hemorrhage)
 LR NIPS
 36 weeks gestation of pregnancy
Fetal Evaluation

 Num Of Fetuses:         1
 Fetal Heart Rate(bpm):  148
 Cardiac Activity:       Observed
 Presentation:           Cephalic
 Placenta:               Anterior
 P. Cord Insertion:      Previously Visualized

 Amniotic Fluid
 AFI FV:      Within normal limits

 AFI Sum(cm)     %Tile       Largest Pocket(cm)
 7.8             6           3

 RUQ(cm)                     LUQ(cm)        LLQ(cm)
 2.7                         2.1            3
Biometry

 BPD:      93.4  mm     G. Age:  38w 0d         94  %    CI:        82.77   %    70 - 86
                                                         FL/HC:      20.4   %    20.1 -
 HC:      323.8  mm     G. Age:  36w 4d         28  %    HC/AC:      1.02        0.93 -
 AC:      317.2  mm     G. Age:  35w 5d         42  %    FL/BPD:     70.9   %    71 - 87
 FL:       66.2  mm     G. Age:  34w 1d          5  %    FL/AC:      20.9   %    20 - 24
 HUM:      60.6  mm     G. Age:  35w 1d         43  %
 LV:        6.3  mm

 Est. FW:    [GS]  gm           6 lb     33  %
OB History

 Blood Type:   O+
 Gravidity:    8         Term:   5        Prem:   0        SAB:   2
 TOP:          0       Ectopic:  0        Living: 5
Gestational Age

 U/S Today:     36w 1d                                        EDD:   [DATE]
 Best:          36w 2d     Det. By:  Early Ultrasound         EDD:   [DATE]
                                     ([DATE])
Anatomy

 Cranium:               Appears normal         LVOT:                   Previously seen
 Cavum:                 Appears normal         Aortic Arch:            Appears normal
 Ventricles:            Appears normal         Ductal Arch:            Appears normal
 Choroid Plexus:        Previously seen        Diaphragm:              Previously seen
 Cerebellum:            Previously seen        Stomach:                Appears normal, left
                                                                       sided
 Posterior Fossa:       Previously seen        Abdomen:                Appears normal
 Nuchal Fold:           Not applicable (>20    Abdominal Wall:         Previously seen
                        wks GA)
 Face:                  Orbits and profile     Cord Vessels:           Previously seen
                        previously seen
 Lips:                  Previously seen        Kidneys:                Appear normal
 Palate:                Previously seen        Bladder:                Appears normal
 Thoracic:              Previously seen        Spine:                  Previously seen
 Heart:                 Previously seen        Upper Extremities:      Previously seen
 RVOT:                  Appears normal         Lower Extremities:      Previously seen

 Other:  Male gender previously seen. Nasal bone, Heels/feet and open
         hands/5th digits, VC, 3VV and 3VTV previously visualized.
         Technically difficult due to fetal position.
Cervix Uterus Adnexa

 Cervix
 Not visualized (advanced GA >[GS])

 Uterus
 No abnormality visualized.

 Right Ovary
 Not visualized.
 Left Ovary
 Not visualized.

 Cul De Sac
 No free fluid seen.

 Adnexa
 No abnormality visualized.
Impression

 Follow up growth due to elevated BMI
 Normal interval growth with measurements consistent with
 dates
 Good fetal movement and amniotic fluid volume
Recommendations

 Follow up as clinically indicated.

## 2021-02-03 ENCOUNTER — Ambulatory Visit (INDEPENDENT_AMBULATORY_CARE_PROVIDER_SITE_OTHER): Payer: Medicaid Other | Admitting: Certified Nurse Midwife

## 2021-02-03 ENCOUNTER — Other Ambulatory Visit: Payer: Medicaid Other

## 2021-02-03 ENCOUNTER — Other Ambulatory Visit: Payer: Self-pay

## 2021-02-03 VITALS — BP 126/85 | HR 101 | Wt 245.8 lb

## 2021-02-03 DIAGNOSIS — Z113 Encounter for screening for infections with a predominantly sexual mode of transmission: Secondary | ICD-10-CM

## 2021-02-03 DIAGNOSIS — O99213 Obesity complicating pregnancy, third trimester: Secondary | ICD-10-CM

## 2021-02-03 DIAGNOSIS — Z3483 Encounter for supervision of other normal pregnancy, third trimester: Secondary | ICD-10-CM | POA: Diagnosis not present

## 2021-02-03 DIAGNOSIS — Z3A36 36 weeks gestation of pregnancy: Secondary | ICD-10-CM

## 2021-02-03 LAB — POCT URINALYSIS DIPSTICK OB
Bilirubin, UA: NEGATIVE
Blood, UA: NEGATIVE
Glucose, UA: NEGATIVE
Ketones, UA: NEGATIVE
Leukocytes, UA: NEGATIVE
Nitrite, UA: NEGATIVE
POC,PROTEIN,UA: NEGATIVE
Spec Grav, UA: 1.01 (ref 1.010–1.025)
Urobilinogen, UA: 0.2 E.U./dL
pH, UA: 7.5 (ref 5.0–8.0)

## 2021-02-03 NOTE — Progress Notes (Signed)
ROB at 36+3. Feeling exhausted. Baby is active. Reports occasional HA and seeing spots. Repeat BP 127/72. Pre-eclampsia labs drawn.GBS/GC chlam swabs done today. Pt requests SVE FT/50/-3. Discussed antenatal testing recommendations based on BMI. Does not wish to schedule IOL but will continue weekly NSTs.  NST: Baseline: 145 Variability: Moderate Accels: Present >2 15x15 Decels: Absent Contractions: None  Knows when to call in labor. Pre-e danger signs reviewed.

## 2021-02-04 ENCOUNTER — Encounter: Payer: Self-pay | Admitting: Certified Nurse Midwife

## 2021-02-04 LAB — CBC WITH DIFFERENTIAL/PLATELET
Basophils Absolute: 0 10*3/uL (ref 0.0–0.2)
Basos: 0 %
EOS (ABSOLUTE): 0.5 10*3/uL — ABNORMAL HIGH (ref 0.0–0.4)
Eos: 4 %
Hematocrit: 31.1 % — ABNORMAL LOW (ref 34.0–46.6)
Hemoglobin: 10.3 g/dL — ABNORMAL LOW (ref 11.1–15.9)
Immature Grans (Abs): 0.2 10*3/uL — ABNORMAL HIGH (ref 0.0–0.1)
Immature Granulocytes: 1 %
Lymphocytes Absolute: 2.8 10*3/uL (ref 0.7–3.1)
Lymphs: 23 %
MCH: 27.5 pg (ref 26.6–33.0)
MCHC: 33.1 g/dL (ref 31.5–35.7)
MCV: 83 fL (ref 79–97)
Monocytes Absolute: 0.9 10*3/uL (ref 0.1–0.9)
Monocytes: 7 %
Neutrophils Absolute: 7.6 10*3/uL — ABNORMAL HIGH (ref 1.4–7.0)
Neutrophils: 65 %
Platelets: 236 10*3/uL (ref 150–450)
RBC: 3.74 x10E6/uL — ABNORMAL LOW (ref 3.77–5.28)
RDW: 14.2 % (ref 11.7–15.4)
WBC: 12 10*3/uL — ABNORMAL HIGH (ref 3.4–10.8)

## 2021-02-04 LAB — PROTEIN / CREATININE RATIO, URINE
Creatinine, Urine: 99.6 mg/dL
Protein, Ur: 9.1 mg/dL
Protein/Creat Ratio: 91 mg/g creat (ref 0–200)

## 2021-02-04 LAB — COMPREHENSIVE METABOLIC PANEL
ALT: 8 IU/L (ref 0–32)
AST: 13 IU/L (ref 0–40)
Albumin/Globulin Ratio: 1.2 (ref 1.2–2.2)
Albumin: 3.5 g/dL — ABNORMAL LOW (ref 3.9–5.0)
Alkaline Phosphatase: 136 IU/L — ABNORMAL HIGH (ref 44–121)
BUN/Creatinine Ratio: 14 (ref 9–23)
BUN: 6 mg/dL (ref 6–20)
Bilirubin Total: 0.2 mg/dL (ref 0.0–1.2)
CO2: 22 mmol/L (ref 20–29)
Calcium: 8.5 mg/dL — ABNORMAL LOW (ref 8.7–10.2)
Chloride: 105 mmol/L (ref 96–106)
Creatinine, Ser: 0.43 mg/dL — ABNORMAL LOW (ref 0.57–1.00)
Globulin, Total: 2.9 g/dL (ref 1.5–4.5)
Glucose: 71 mg/dL (ref 70–99)
Potassium: 4.1 mmol/L (ref 3.5–5.2)
Sodium: 139 mmol/L (ref 134–144)
Total Protein: 6.4 g/dL (ref 6.0–8.5)
eGFR: 134 mL/min/{1.73_m2} (ref >59)

## 2021-02-05 LAB — STREP GP B NAA: Strep Gp B NAA: NEGATIVE

## 2021-02-07 LAB — GC/CHLAMYDIA PROBE AMP
Chlamydia trachomatis, NAA: NEGATIVE
Neisseria Gonorrhoeae by PCR: NEGATIVE

## 2021-02-11 ENCOUNTER — Other Ambulatory Visit: Payer: Self-pay

## 2021-02-11 ENCOUNTER — Encounter: Payer: Self-pay | Admitting: Obstetrics and Gynecology

## 2021-02-11 ENCOUNTER — Inpatient Hospital Stay
Admission: EM | Admit: 2021-02-11 | Discharge: 2021-02-14 | DRG: 807 | Disposition: A | Discharge status: Home / Self Care | Payer: Medicaid Other | Attending: Certified Nurse Midwife | Admitting: Certified Nurse Midwife

## 2021-02-11 DIAGNOSIS — Z20822 Contact with and (suspected) exposure to covid-19: Secondary | ICD-10-CM | POA: Diagnosis present

## 2021-02-11 DIAGNOSIS — R030 Elevated blood-pressure reading, without diagnosis of hypertension: Secondary | ICD-10-CM | POA: Diagnosis not present

## 2021-02-11 DIAGNOSIS — O4292 Full-term premature rupture of membranes, unspecified as to length of time between rupture and onset of labor: Principal | ICD-10-CM | POA: Diagnosis present

## 2021-02-11 DIAGNOSIS — Z8614 Personal history of Methicillin resistant Staphylococcus aureus infection: Secondary | ICD-10-CM

## 2021-02-11 DIAGNOSIS — O99893 Other specified diseases and conditions complicating puerperium: Secondary | ICD-10-CM | POA: Diagnosis not present

## 2021-02-11 DIAGNOSIS — Z3490 Encounter for supervision of normal pregnancy, unspecified, unspecified trimester: Secondary | ICD-10-CM

## 2021-02-11 DIAGNOSIS — Z3A37 37 weeks gestation of pregnancy: Secondary | ICD-10-CM

## 2021-02-11 DIAGNOSIS — O99214 Obesity complicating childbirth: Secondary | ICD-10-CM | POA: Diagnosis present

## 2021-02-11 LAB — CBC WITH DIFFERENTIAL/PLATELET
Abs Immature Granulocytes: 0.11 10*3/uL — ABNORMAL HIGH (ref 0.00–0.07)
Basophils Absolute: 0 10*3/uL (ref 0.0–0.1)
Basophils Relative: 0 %
Eosinophils Absolute: 0.4 10*3/uL (ref 0.0–0.5)
Eosinophils Relative: 3 %
HCT: 29.3 % — ABNORMAL LOW (ref 36.0–46.0)
Hemoglobin: 10.2 g/dL — ABNORMAL LOW (ref 12.0–15.0)
Immature Granulocytes: 1 %
Lymphocytes Relative: 27 %
Lymphs Abs: 3 10*3/uL (ref 0.7–4.0)
MCH: 27.6 pg (ref 26.0–34.0)
MCHC: 34.8 g/dL (ref 30.0–36.0)
MCV: 79.2 fL — ABNORMAL LOW (ref 80.0–100.0)
Monocytes Absolute: 0.8 10*3/uL (ref 0.1–1.0)
Monocytes Relative: 7 %
Neutro Abs: 6.8 10*3/uL (ref 1.7–7.7)
Neutrophils Relative %: 62 %
Platelets: 252 10*3/uL (ref 150–400)
RBC: 3.7 MIL/uL — ABNORMAL LOW (ref 3.87–5.11)
RDW: 13.4 % (ref 11.5–15.5)
WBC: 11.1 10*3/uL — ABNORMAL HIGH (ref 4.0–10.5)
nRBC: 0 % (ref 0.0–0.2)

## 2021-02-11 LAB — RUPTURE OF MEMBRANE (ROM)PLUS: Rom Plus: POSITIVE

## 2021-02-12 ENCOUNTER — Encounter: Payer: Self-pay | Admitting: Certified Nurse Midwife

## 2021-02-12 ENCOUNTER — Encounter: Payer: Medicaid Other | Admitting: Obstetrics

## 2021-02-12 DIAGNOSIS — O4292 Full-term premature rupture of membranes, unspecified as to length of time between rupture and onset of labor: Secondary | ICD-10-CM | POA: Diagnosis present

## 2021-02-12 DIAGNOSIS — O09293 Supervision of pregnancy with other poor reproductive or obstetric history, third trimester: Secondary | ICD-10-CM

## 2021-02-12 DIAGNOSIS — O99214 Obesity complicating childbirth: Secondary | ICD-10-CM | POA: Diagnosis present

## 2021-02-12 DIAGNOSIS — Z3A37 37 weeks gestation of pregnancy: Secondary | ICD-10-CM

## 2021-02-12 DIAGNOSIS — O4202 Full-term premature rupture of membranes, onset of labor within 24 hours of rupture: Secondary | ICD-10-CM

## 2021-02-12 DIAGNOSIS — E669 Obesity, unspecified: Secondary | ICD-10-CM | POA: Diagnosis not present

## 2021-02-12 DIAGNOSIS — R030 Elevated blood-pressure reading, without diagnosis of hypertension: Secondary | ICD-10-CM | POA: Diagnosis not present

## 2021-02-12 DIAGNOSIS — O0943 Supervision of pregnancy with grand multiparity, third trimester: Secondary | ICD-10-CM | POA: Diagnosis not present

## 2021-02-12 DIAGNOSIS — Z8614 Personal history of Methicillin resistant Staphylococcus aureus infection: Secondary | ICD-10-CM | POA: Diagnosis not present

## 2021-02-12 DIAGNOSIS — O99893 Other specified diseases and conditions complicating puerperium: Secondary | ICD-10-CM | POA: Diagnosis not present

## 2021-02-12 DIAGNOSIS — Z20822 Contact with and (suspected) exposure to covid-19: Secondary | ICD-10-CM | POA: Diagnosis present

## 2021-02-12 LAB — COMPREHENSIVE METABOLIC PANEL
ALT: 13 U/L (ref 0–44)
AST: 20 U/L (ref 15–41)
Albumin: 2.8 g/dL — ABNORMAL LOW (ref 3.5–5.0)
Alkaline Phosphatase: 133 U/L — ABNORMAL HIGH (ref 38–126)
Anion gap: 3 — ABNORMAL LOW (ref 5–15)
BUN: 7 mg/dL (ref 6–20)
CO2: 21 mmol/L — ABNORMAL LOW (ref 22–32)
Calcium: 8.4 mg/dL — ABNORMAL LOW (ref 8.9–10.3)
Chloride: 108 mmol/L (ref 98–111)
Creatinine, Ser: 0.47 mg/dL (ref 0.44–1.00)
GFR, Estimated: >60 mL/min (ref >60)
Glucose, Bld: 120 mg/dL — ABNORMAL HIGH (ref 70–99)
Potassium: 3.4 mmol/L — ABNORMAL LOW (ref 3.5–5.1)
Sodium: 132 mmol/L — ABNORMAL LOW (ref 135–145)
Total Bilirubin: 0.4 mg/dL (ref 0.3–1.2)
Total Protein: 6.8 g/dL (ref 6.5–8.1)

## 2021-02-12 LAB — RPR: RPR Ser Ql: NONREACTIVE

## 2021-02-12 LAB — PROTEIN / CREATININE RATIO, URINE
Creatinine, Urine: 105 mg/dL
Protein Creatinine Ratio: 0.16 mg/mg{Cre} — ABNORMAL HIGH (ref 0.00–0.15)
Total Protein, Urine: 17 mg/dL

## 2021-02-12 MED ORDER — PRENATAL MULTIVITAMIN CH
1.0000 | ORAL_TABLET | Freq: Every day | ORAL | Status: DC
  Administered 2021-02-12 – 2021-02-13 (×2): 1 via ORAL
  Filled 2021-02-12 (×2): qty 1

## 2021-02-12 MED ORDER — BUTORPHANOL TARTRATE 1 MG/ML IJ SOLN
1.0000 mg | INTRAMUSCULAR | Status: DC | PRN
  Administered 2021-02-12: 08:00:00 1 mg via INTRAVENOUS
  Filled 2021-02-12: qty 1

## 2021-02-12 MED ORDER — OXYTOCIN-SODIUM CHLORIDE 30-0.9 UT/500ML-% IV SOLN
2.5000 [IU]/h | INTRAVENOUS | Status: DC
  Filled 2021-02-12: qty 1000

## 2021-02-12 MED ORDER — TRANEXAMIC ACID-NACL 1000-0.7 MG/100ML-% IV SOLN
INTRAVENOUS | Status: AC
  Administered 2021-02-12: 09:00:00 1000 mg
  Filled 2021-02-12: qty 100

## 2021-02-12 MED ORDER — ONDANSETRON HCL 4 MG/2ML IJ SOLN: 4.0000 mg | INTRAMUSCULAR | Status: DC | PRN

## 2021-02-12 MED ORDER — COCONUT OIL OIL
1.0000 "application " | TOPICAL_OIL | Status: DC | PRN
  Administered 2021-02-12: 1 via TOPICAL
  Filled 2021-02-12: qty 120

## 2021-02-12 MED ORDER — DOCUSATE SODIUM 100 MG PO CAPS
100.0000 mg | ORAL_CAPSULE | Freq: Two times a day (BID) | ORAL | Status: DC
  Administered 2021-02-13 – 2021-02-14 (×3): 100 mg via ORAL
  Filled 2021-02-12 (×3): qty 1

## 2021-02-12 MED ORDER — LIDOCAINE HCL (PF) 1 % IJ SOLN
INTRAMUSCULAR | Status: AC
  Filled 2021-02-12: qty 30

## 2021-02-12 MED ORDER — SOD CITRATE-CITRIC ACID 500-334 MG/5ML PO SOLN: 30.0000 mL | ORAL | Status: DC | PRN

## 2021-02-12 MED ORDER — SENNOSIDES-DOCUSATE SODIUM 8.6-50 MG PO TABS
2.0000 | ORAL_TABLET | ORAL | Status: DC
  Administered 2021-02-12 – 2021-02-14 (×3): 2 via ORAL
  Filled 2021-02-12 (×3): qty 2

## 2021-02-12 MED ORDER — OXYTOCIN 10 UNIT/ML IJ SOLN
INTRAMUSCULAR | Status: AC
  Filled 2021-02-12: qty 2

## 2021-02-12 MED ORDER — IBUPROFEN 600 MG PO TABS
600.0000 mg | ORAL_TABLET | Freq: Four times a day (QID) | ORAL | Status: DC
  Administered 2021-02-12 – 2021-02-14 (×8): 600 mg via ORAL
  Filled 2021-02-12 (×8): qty 1

## 2021-02-12 MED ORDER — MISOPROSTOL 25 MCG QUARTER TABLET
25.0000 ug | ORAL_TABLET | ORAL | Status: DC
  Administered 2021-02-12: 02:00:00 25 ug via BUCCAL
  Filled 2021-02-12: qty 1

## 2021-02-12 MED ORDER — AMMONIA AROMATIC IN INHA
RESPIRATORY_TRACT | Status: AC
  Filled 2021-02-12: qty 10

## 2021-02-12 MED ORDER — MISOPROSTOL 25 MCG QUARTER TABLET
25.0000 ug | ORAL_TABLET | ORAL | Status: DC
  Administered 2021-02-12: 02:00:00 25 ug via VAGINAL
  Filled 2021-02-12: qty 1

## 2021-02-12 MED ORDER — LIDOCAINE HCL (PF) 1 % IJ SOLN: 30.0000 mL | INTRAMUSCULAR | Status: DC | PRN

## 2021-02-12 MED ORDER — OXYTOCIN BOLUS FROM INFUSION
333.0000 mL | Freq: Once | INTRAVENOUS | Status: AC
  Administered 2021-02-12: 09:00:00 333 mL via INTRAVENOUS

## 2021-02-12 MED ORDER — BENZOCAINE-MENTHOL 20-0.5 % EX AERO
1.0000 "application " | INHALATION_SPRAY | CUTANEOUS | Status: DC | PRN
  Administered 2021-02-12: 1 via TOPICAL
  Filled 2021-02-12: qty 56

## 2021-02-12 MED ORDER — OXYCODONE-ACETAMINOPHEN 5-325 MG PO TABS
1.0000 | ORAL_TABLET | ORAL | Status: DC | PRN
  Administered 2021-02-12: 23:00:00 1 via ORAL
  Filled 2021-02-12 (×2): qty 1

## 2021-02-12 MED ORDER — DIBUCAINE (PERIANAL) 1 % EX OINT: 1.0000 "application " | TOPICAL_OINTMENT | CUTANEOUS | Status: DC | PRN

## 2021-02-12 MED ORDER — FERROUS SULFATE 325 (65 FE) MG PO TABS
325.0000 mg | ORAL_TABLET | Freq: Every day | ORAL | Status: DC
  Administered 2021-02-13 – 2021-02-14 (×2): 325 mg via ORAL
  Filled 2021-02-12 (×2): qty 1

## 2021-02-12 MED ORDER — OXYCODONE-ACETAMINOPHEN 5-325 MG PO TABS
2.0000 | ORAL_TABLET | ORAL | Status: DC | PRN
  Administered 2021-02-12 – 2021-02-13 (×2): 2 via ORAL
  Filled 2021-02-12 (×2): qty 2

## 2021-02-12 MED ORDER — ONDANSETRON HCL 4 MG PO TABS: 4.0000 mg | ORAL_TABLET | ORAL | Status: DC | PRN

## 2021-02-12 MED ORDER — MISOPROSTOL 50MCG HALF TABLET: 50.0000 ug | ORAL_TABLET | ORAL | Status: DC

## 2021-02-12 MED ORDER — ACETAMINOPHEN 325 MG PO TABS
650.0000 mg | ORAL_TABLET | ORAL | Status: DC | PRN
  Administered 2021-02-12 (×2): 650 mg via ORAL
  Filled 2021-02-12 (×2): qty 2

## 2021-02-12 MED ORDER — ONDANSETRON HCL 4 MG/2ML IJ SOLN
4.0000 mg | Freq: Four times a day (QID) | INTRAMUSCULAR | Status: DC | PRN
  Administered 2021-02-12: 08:00:00 4 mg via INTRAVENOUS
  Filled 2021-02-12: qty 2

## 2021-02-12 MED ORDER — SIMETHICONE 80 MG PO CHEW: 80.0000 mg | CHEWABLE_TABLET | ORAL | Status: DC | PRN

## 2021-02-12 MED ORDER — OXYTOCIN-SODIUM CHLORIDE 30-0.9 UT/500ML-% IV SOLN
1.0000 m[IU]/min | INTRAVENOUS | Status: DC
  Administered 2021-02-12: 07:00:00 4 m[IU]/min via INTRAVENOUS

## 2021-02-12 MED ORDER — TERBUTALINE SULFATE 1 MG/ML IJ SOLN: 0.2500 mg | Freq: Once | INTRAMUSCULAR | Status: DC | PRN

## 2021-02-12 MED ORDER — LACTATED RINGERS IV SOLN: INTRAVENOUS | Status: DC

## 2021-02-12 MED ORDER — WITCH HAZEL-GLYCERIN EX PADS: 1.0000 "application " | MEDICATED_PAD | CUTANEOUS | Status: DC | PRN

## 2021-02-12 MED ORDER — MISOPROSTOL 200 MCG PO TABS
ORAL_TABLET | ORAL | Status: AC
  Filled 2021-02-12: qty 4

## 2021-02-12 MED ORDER — LACTATED RINGERS IV SOLN: 500.0000 mL | INTRAVENOUS | Status: DC | PRN

## 2021-02-12 NOTE — H&P (Signed)
History and Physical   HPI  Brittany [name] is a 30 y.o. [id number] at [redacted]w[redacted]d Estimated Date of Delivery: 02/28/21 who is being admitted for PROM.   OB History  OB History  Gravida Para Term Preterm AB Living  8 5 5  0 2 5  SAB IAB Ectopic Multiple Live Births  2 0 0 0 5    # Outcome Date GA Lbr Len/2nd Weight Sex Delivery Anes PTL Lv  8 Current           7 SAB 12/17/19          6 Term 05/18/19 [redacted]w[redacted]d / 00:14 3230 g F Vag-Spont EPI  LIV     Name: Brittany [name]     Apgar1: 9  Apgar5: 9  5 SAB 2020          4 Term 09/03/13 [redacted]w[redacted]d 02:13 / 00:22 3141 g F Vag-Spont EPI  LIV     Name: Brittany [name]     Apgar1: 9  Apgar5: 9  3 Term 12/07/11 [redacted]w[redacted]d / 00:04 3104 g F Vag-Spont None  LIV     Name: Brittany [name]     Apgar1: 8  Apgar5: 9  2 Term 08/30/10 [redacted]w[redacted]d 03:25 / 12:30 3090 g M Vag-Spont None  LIV     Birth Comments: diabetic- oral meds     Name: Brittany [name]     Apgar1: 9  Apgar5: 9  1 Term 06/16/07    M Vag-Spont   LIV    PROBLEM LIST  Pregnancy complications or risks: Patient Active Problem List   Diagnosis Date Noted   Pregnancy 02/11/2021   Labor and delivery, indication for care 01/11/2021   History of postpartum hemorrhage 05/18/2019   Asthma 02/25/2018   Pregnant 12/01/2011    Prenatal labs and studies: ABO, Rh: --/--/O POS (12/28 2315)O pos Antibody: NEG (12/28 2315)Neg Rubella: 1.34 (05/26 1042) RPR: Non Reactive (10/24 1445)  HBsAg: Negative (05/26 1042)  HIV: Non Reactive (05/26 1042)  07-10-1977-- (12/20 1326)   Past Medical History:  Diagnosis Date   Asthma    Chlamydia    Gonorrhea    Hx MRSA infection 02/16/2003   Trichimoniasis      Past Surgical History:  Procedure Laterality Date   MULTIPLE TOOTH EXTRACTIONS       Medications    Current Discharge Medication List     CONTINUE these medications which have NOT CHANGED   Details  fluticasone (FLOVENT HFA) 110 MCG/ACT inhaler INHALE 1 PUFF  INTO THE LUNGS TWICE A DAY Qty: 12 each, Refills: 1    Prenatal Multivit-Min-Fe-FA (PRE-NATAL PO) Take by mouth.    aspirin EC 81 MG tablet Take 1 tablet (81 mg total) by mouth daily. Swallow whole. Start daily at [redacted] wks pregnant Qty: 30 tablet, Refills: 11    cyclobenzaprine (FLEXERIL) 5 MG tablet TAKE 1 TABLET BY MOUTH 2 TIMES DAILY AS NEEDED FOR MUSCLE SPASMS. Qty: 15 tablet, Refills: 0    Iron-FA-B Cmp-C-Biot-Probiotic (FUSION PLUS) CAPS Take 1 tablet by mouth daily. Qty: 30 capsule, Refills: 6    PROAIR HFA 108 (90 Base) MCG/ACT inhaler INHALE 2 PUFFS BY MOUTH EVERY 4 HOURS AS NEEDED FOR WHEEZE OR FOR SHORTNESS OF BREATH Qty: 8.5 each, Refills: 1         Allergies  Iodine and Sulfonamide derivatives  Review of Systems  Constitutional: negative Eyes: negative Ears, nose, mouth, throat, and face: negative Respiratory: negative Cardiovascular: negative Gastrointestinal: negative Genitourinary:negative Integument/breast: negative Hematologic/lymphatic:  negative Musculoskeletal:negative Neurological: negative Behavioral/Psych: negative Endocrine: negative Allergic/Immunologic: negative  Physical Exam  BP (!) 137/97 (BP Location: Right Arm)    Pulse 85    Temp 98.4 F (36.9 C) (Oral)    Resp 16    Ht 5\' 5"  (1.651 m)    Wt 112 kg    LMP  (LMP Unknown)    BMI 41.09 kg/m   Lungs:  CTA B Cardio: RRR without M/R/G Abd: Soft, gravid, NT Presentation: cephalic EXT: No C/C/ 1+ Edema DTRs: 2+ B CERVIX: Dilation: 3 Effacement (%): 80 Cervical Position: Middle Station: -2 Exam by:: [email or web address]  See Prenatal records for more detailed PE.     FHR:  Baseline: 140 bpm, Variability: Good {> 6 bpm), Accelerations: Reactive, and Decelerations: Early  Toco: Uterine Contractions: Frequency: Every 2-4 minutes  Test Results  Results for orders placed or performed during the [name] encounter of 02/11/21 (from the past 24 hour(s))  ROM Plus ([facility] only)     Status:  None   Collection Time: 02/11/21 11:15 PM  Result Value Ref Range   Rom Plus POSITIVE   Type and screen [facility]     Status: None   Collection Time: 02/11/21 11:15 PM  Result Value Ref Range   ABO/RH(D) O POS    Antibody Screen NEG    Sample Expiration      02/14/2021,2359 Performed at [facility], [address] [place], [place]   CBC with Differential/Platelet     Status: Abnormal   Collection Time: 02/11/21 11:37 PM  Result Value Ref Range   WBC 11.1 (H) 4.0 - 10.5 K/uL   RBC 3.70 (L) 3.87 - 5.11 MIL/uL   Hemoglobin 10.2 (L) 12.0 - 15.0 g/dL   HCT 02/13/21 (L) 84.6 - 96.2 %   MCV 79.2 (L) 80.0 - 100.0 fL   MCH 27.6 26.0 - 34.0 pg   MCHC 34.8 30.0 - 36.0 g/dL   RDW 95.2 84.1 - 32.4 %   Platelets 252 150 - 400 K/uL   nRBC 0.0 0.0 - 0.2 %   Neutrophils Relative % 62 %   Neutro Abs 6.8 1.7 - 7.7 K/uL   Lymphocytes Relative 27 %   Lymphs Abs 3.0 0.7 - 4.0 K/uL   Monocytes Relative 7 %   Monocytes Absolute 0.8 0.1 - 1.0 K/uL   Eosinophils Relative 3 %   Eosinophils Absolute 0.4 0.0 - 0.5 K/uL   Basophils Relative 0 %   Basophils Absolute 0.0 0.0 - 0.1 K/uL   Immature Granulocytes 1 %   Abs Immature Granulocytes 0.11 (H) 0.00 - 0.07 K/uL  Comprehensive metabolic panel     Status: Abnormal   Collection Time: 02/11/21 11:37 PM  Result Value Ref Range   Sodium 132 (L) 135 - 145 mmol/L   Potassium 3.4 (L) 3.5 - 5.1 mmol/L   Chloride 108 98 - 111 mmol/L   CO2 21 (L) 22 - 32 mmol/L   Glucose, Bld 120 (H) 70 - 99 mg/dL   BUN 7 6 - 20 mg/dL   Creatinine, Ser 02/13/21 0.44 - 1.00 mg/dL   Calcium 8.4 (L) 8.9 - 10.3 mg/dL   Total Protein 6.8 6.5 - 8.1 g/dL   Albumin 2.8 (L) 3.5 - 5.0 g/dL   AST 20 15 - 41 U/L   ALT 13 0 - 44 U/L   Alkaline Phosphatase 133 (H) 38 - 126 U/L   Total Bilirubin 0.4 0.3 - 1.2 mg/dL  GFR, Estimated >60 >60 mL/min   Anion gap 3 (L) 5 - 15  Protein / creatinine ratio, urine     Status: Abnormal   Collection  Time: 02/11/21 11:37 PM  Result Value Ref Range   Creatinine, Urine 105 mg/dL   Total Protein, Urine 17 mg/dL   Protein Creatinine Ratio 0.16 (H) 0.00 - 0.15 mg/mg[Cre]   Group B Strep negative  Assessment   [id number] at [redacted]w[redacted]d Estimated Date of Delivery: 02/28/21  The fetus is reassuring.   Patient Active Problem List   Diagnosis Date Noted   Pregnancy 02/11/2021   Labor and delivery, indication for care 01/11/2021   History of postpartum hemorrhage 05/18/2019   Asthma 02/25/2018   Pregnant 12/01/2011    Plan  1. Admitted to L&D :   2. EFM:-- Category 1 3. Stadol or Epidural if desired.   4. Admission labs completed 5. Dr. Logan Bores aware of pt admission 6. Anticipate NSVD  Doreene Burke, CNM . 02/12/2021 7:13 AM

## 2021-02-12 NOTE — Progress Notes (Signed)
LABOR NOTE   Brittany [name] 30 y.o.GP@ at [redacted]w[redacted]d  SUBJECTIVE:  Feeling lots of pressure Analgesia: IV pain meds  OBJECTIVE:  BP (!) 137/97 (BP Location: Right Arm)    Pulse 85    Temp 98.4 F (36.9 C) (Oral)    Resp 16    Ht 5\' 5"  (1.651 m)    Wt 112 kg    LMP  (LMP Unknown)    BMI 41.09 kg/m  No intake/output data recorded.  She has shown cervical change. CERVIX: 5 cm:  80%:   -2:   mid position:   firm SVE:   Dilation: 5 Effacement (%): 80 Station: -2 Exam by:: [email or web address] CNM CONTRACTIONS: regular, every 2-4 minutes FHR: Fetal heart tracing reviewed. Baseline: 140 bpm, Variability: Good {> 6 bpm), Accelerations: Non-reactive but appropriate for gestational age, and Decelerations: Early Category I    Labs: Lab Results  Component Value Date   WBC 11.1 (H) 02/11/2021   HGB 10.2 (L) 02/11/2021   HCT 29.3 (L) 02/11/2021   MCV 79.2 (L) 02/11/2021   PLT 252 02/11/2021    ASSESSMENT: 1) Labor curve reviewed.       Progress: Active phase labor.     Membranes: ruptured, clear fluid           Principal Problem:   Pregnancy Active Problems:   Labor and delivery, indication for care Obesity  Grand multipara   PLAN: continue present management and IV Pitocin augmentation   02/13/2021, CNM  02/12/2021 8:15 AM

## 2021-02-12 NOTE — Lactation Note (Signed)
This note was copied from a baby's chart. Lactation Consultation Note  Patient Name: Brittany [name] [id number] Date: 02/12/2021 Reason for consult: Initial assessment;Early term 37-38.6wks Age:30 hours  Initial lactation visit. Mom is P6, SVD 3 hours ago to baby born at [redacted]w[redacted]d. Mom has some breastfeeding history with longer history of 18 months with only 1 child. Mom desires to breast and bottle feed- the child that did BF would not accept bottle or cup, mom does not want that to happen again. Mom getting ready to feed baby upon entry. Baby has clothes on and was swaddled then brought to breast in football hold on R breast, mom did all independent. It did take a few attempts before baby sustained latch, but had rhythmic sucking pattern. LC educated mom on stamina and muscle strength of some babies born a few weeks early. We discussed feeding plan of baby at breast on demand and possible supplement if baby appears still hungry, or to ensure adequate nutritional intake. Mom open to feeding plan. Reviewed early cues, keeping baby awake and alert at breast, and output expectations.  Whiteboard updated with LC name/number.  Maternal Data Has patient been taught Hand Expression?: Yes Does the patient have breastfeeding experience prior to this delivery?: Yes How long did the patient breastfeed?: longest was 18 months  Feeding Mother's Current Feeding Choice: Breast Milk and Formula  LATCH Score Latch: Repeated attempts needed to sustain latch, nipple held in mouth throughout feeding, stimulation needed to elicit sucking reflex.  Audible Swallowing: A few with stimulation  Type of Nipple: Everted at rest and after stimulation  Comfort (Breast/Nipple): Soft / non-tender  Hold (Positioning): No assistance needed to correctly position infant at breast.  LATCH Score: 8   Lactation Tools Discussed/Used    Interventions Interventions: Breast feeding basics reviewed;Hand express;Hand  pump;Education  Discharge Pump: Manual  Consult Status Consult Status: Follow-up Date: 02/13/21 Follow-up type: In-patient    Danford Bad 02/12/2021, 12:24 PM

## 2021-02-13 LAB — CBC
HCT: 25.3 % — ABNORMAL LOW (ref 36.0–46.0)
Hemoglobin: 8.8 g/dL — ABNORMAL LOW (ref 12.0–15.0)
MCH: 27.8 pg (ref 26.0–34.0)
MCHC: 34.8 g/dL (ref 30.0–36.0)
MCV: 80.1 fL (ref 80.0–100.0)
Platelets: 239 10*3/uL (ref 150–400)
RBC: 3.16 MIL/uL — ABNORMAL LOW (ref 3.87–5.11)
RDW: 13.5 % (ref 11.5–15.5)
WBC: 14.6 10*3/uL — ABNORMAL HIGH (ref 4.0–10.5)
nRBC: 0 % (ref 0.0–0.2)

## 2021-02-13 LAB — RESP PANEL BY RT-PCR (FLU A&B, COVID) ARPGX2
Influenza A by PCR: NEGATIVE
Influenza B by PCR: NEGATIVE
SARS Coronavirus 2 by RT PCR: NEGATIVE

## 2021-02-13 MED ORDER — NIFEDIPINE ER OSMOTIC RELEASE 30 MG PO TB24
30.0000 mg | ORAL_TABLET | Freq: Every day | ORAL | Status: DC
  Administered 2021-02-13 (×2): 30 mg via ORAL
  Filled 2021-02-13 (×2): qty 1

## 2021-02-13 NOTE — Lactation Note (Signed)
This note was copied from a baby's chart. Lactation Consultation Note  Patient Name: Brittany [name] [id number] Date: 02/13/2021 Reason for consult: Follow-up assessment;Early term 37-38.6wks Age:31 hours  Maternal Data Does the patient have breastfeeding experience prior to this delivery?: Yes How long did the patient breastfeed?: 18 mths  Feeding Mother's Current Feeding Choice: Breast Milk and Formula I did not observe a feeding but mom states baby is latching and nursing well LATCH Score Latch:  (I did not observe a feeding)                  Lactation Tools Discussed/Used  LC name updated on white board  Interventions Interventions: Hand pump  Discharge Pump: Manual (Medela Harmony manual breast pump given per pt request) WIC Program: Yes  Consult Status Consult Status: PRN Date: 02/13/21 Follow-up type: In-patient    Dyann Kief 02/13/2021, 2:05 PM

## 2021-02-13 NOTE — Progress Notes (Signed)
Progress Note - Vaginal Delivery  Brittany [name] is a 30 y.o. [id number] now PP day 1 s/p Vaginal, Spontaneous .   Subjective:  The patient reports no complaints, up ad lib, voiding, tolerating PO, and + flatus   Objective:  Vital signs in last 24 hours: Temp:  [98 F (36.7 C)-98.4 F (36.9 C)] 98 F (36.7 C) (12/30 0908) Pulse Rate:  [71-103] 78 (12/30 0908) Resp:  [18-20] 18 (12/30 0908) BP: (120-150)/(79-104) 131/79 (12/30 0908) SpO2:  [96 %-99 %] 98 % (12/30 0908)  Physical Exam:  General: alert, cooperative, appears stated age, and no distress Lochia: appropriate Uterine Fundus: firm    Data Review Recent Labs    02/11/21 2337 02/13/21 0430  HGB 10.2* 8.8*  HCT 29.3* 25.3*    Assessment/Plan: Principal Problem:   Pregnancy Active Problems:   Labor and delivery, indication for care   Plan for discharge tomorrow Pt started on procardia xl daily for elevated Blood pressures at midnight, will continue to monitor Bps today.   -- Continue routine PP care.     Doreene Burke, CNM  02/13/2021 9:19 AM

## 2021-02-13 NOTE — Progress Notes (Signed)
Spoke with Doreene Burke, CNM about pts increased BP. Will start Procardia XL 30mg  tonight.

## 2021-02-14 LAB — TYPE AND SCREEN
ABO/RH(D): O POS
Antibody Screen: NEGATIVE

## 2021-02-14 MED ORDER — NIFEDIPINE ER 30 MG PO TB24: 30.0000 mg | ORAL_TABLET | Freq: Every day | ORAL | 0 refills | Status: DC

## 2021-02-14 MED ORDER — IBUPROFEN 600 MG PO TABS: 600.0000 mg | ORAL_TABLET | Freq: Four times a day (QID) | ORAL | 0 refills | Status: DC

## 2021-02-14 NOTE — Discharge Summary (Signed)
Patient Name: Brittany [name] DOB: 08-16-1990 MRN: [id number]                            Discharge Summary  Date of Admission: 02/11/2021 Date of Discharge: 02/14/2021 Delivering Provider: Philip [name]   Admitting Diagnosis: Pregnancy [Z34.90] Labor and delivery, indication for care [O75.9] at [redacted]w[redacted]d Secondary diagnosis:  Principal Problem:   Pregnancy Active Problems:   Labor and delivery, indication for care   PROM Mode of Delivery: normal spontaneous vaginal delivery              Discharge diagnosis: Term Pregnancy Delivered      Intrapartum Procedures:  Misoprostol, oxytocin  Complications: none                     Discharge Day SOAP Note:  Progress Note - Vaginal Delivery  Brittany [name] is a 30 y.o. [id number] now PP day 2 s/p Vaginal, Spontaneous . Delivery was uncomplicated  Subjective  The patient has the following complaints: has no unusual complaints  Pain is controlled with current medications.   Patient is urinating without difficulty.  She is ambulating well.     Objective  Vital signs: BP 106/68 (BP Location: Right Arm)    Pulse 95    Temp 98.5 F (36.9 C) (Oral)    Resp 18    Ht 5\' 5"  (1.651 m)    Wt 112 kg    LMP  (LMP Unknown)    SpO2 99%    Breastfeeding Unknown    BMI 41.09 kg/m   Physical Exam: Gen: NAD Fundus Fundal Tone: Firm  Lochia Amount: Scant        Data Review Labs: Lab Results  Component Value Date   WBC 14.6 (H) 02/13/2021   HGB 8.8 (L) 02/13/2021   HCT 25.3 (L) 02/13/2021   MCV 80.1 02/13/2021   PLT 239 02/13/2021   CBC Latest Ref Rng & Units 02/13/2021 02/11/2021 02/03/2021  WBC 4.0 - 10.5 K/uL 14.6(H) 11.1(H) 12.0(H)  Hemoglobin 12.0 - 15.0 g/dL 8.8(L) 10.2(L) 10.3(L)  Hematocrit 36.0 - 46.0 % 25.3(L) 29.3(L) 31.1(L)  Platelets 150 - 400 K/uL 239 252 236   O POS  Edinburgh Score: Edinburgh Postnatal Depression Scale Screening Tool 02/13/2021  I have been able to laugh and see the funny side of  things. 0  I have looked forward with enjoyment to things. 0  I have blamed myself unnecessarily when things went wrong. 0  I have been anxious or worried for no good reason. 0  I have felt scared or panicky for no good reason. 0  Things have been getting on top of me. 0  I have been so unhappy that I have had difficulty sleeping. 0  I have felt sad or miserable. 0  I have been so unhappy that I have been crying. 0  The thought of harming myself has occurred to me. 0  Edinburgh Postnatal Depression Scale Total 0    Assessment/Plan  Principal Problem:   Pregnancy Active Problems:   Labor and delivery, indication for care    Plan for discharge today.  Discharge Instructions: Per After Visit Summary. Activity: Advance as tolerated. Pelvic rest for 6 weeks.  Also refer to After Visit Summary Diet: Regular Medications: Allergies as of 02/14/2021       Reactions   Iodine Other (See Comments)   blistering   Sulfonamide Derivatives Other (  See Comments)   blistering        Medication List     STOP taking these medications    aspirin EC 81 MG tablet   cyclobenzaprine 5 MG tablet Commonly known as: FLEXERIL   Fusion Plus Caps   ProAir HFA 108 (90 Base) MCG/ACT inhaler Generic drug: albuterol       TAKE these medications    fluticasone 110 MCG/ACT inhaler Commonly known as: Flovent HFA INHALE 1 PUFF INTO THE LUNGS TWICE A DAY   ibuprofen 600 MG tablet Commonly known as: ADVIL Take 1 tablet (600 mg total) by mouth every 6 (six) hours.   NIFEdipine 30 MG 24 hr tablet Commonly known as: ADALAT CC Take 1 tablet (30 mg total) by mouth daily.   PRE-NATAL PO Take by mouth.       Outpatient follow up:   Follow-up Information     Brittany [name], CNM Follow up in 2 week(s).   Specialties: Certified Nurse Midwife, Radiology Contact information: [address] [place] [address] [phone]                Postpartum  contraception: Will discuss at first office visit post-partum  Discharged Condition: good     Blood pressure stable on Procardia.  Discharged to: home  Newborn Data: Disposition:home with mother  Apgars: APGAR (1 MIN): 8   APGAR (5 MINS): 9   APGAR (10 MINS):    Baby Feeding: Breast    Brittany [name], M.D. 02/14/2021 11:17 AM

## 2021-02-14 NOTE — Progress Notes (Signed)
Patient discharged home with infant. Discharge instructions, prescriptions and follow up appointment given to and reviewed with patient. Patient verbalized understanding  

## 2021-02-16 ENCOUNTER — Other Ambulatory Visit: Payer: Self-pay | Admitting: Certified Nurse Midwife

## 2021-03-11 ENCOUNTER — Ambulatory Visit (INDEPENDENT_AMBULATORY_CARE_PROVIDER_SITE_OTHER): Payer: Medicaid Other | Admitting: Certified Nurse Midwife

## 2021-03-11 ENCOUNTER — Other Ambulatory Visit: Payer: Self-pay

## 2021-03-11 ENCOUNTER — Encounter: Payer: Medicaid Other | Admitting: Certified Nurse Midwife

## 2021-03-11 VITALS — BP 104/71 | HR 78 | Ht 65.0 in | Wt 226.2 lb

## 2021-03-11 DIAGNOSIS — Z1331 Encounter for screening for depression: Secondary | ICD-10-CM

## 2021-03-11 DIAGNOSIS — Z0130 Encounter for examination of blood pressure without abnormal findings: Secondary | ICD-10-CM

## 2021-03-11 NOTE — Patient Instructions (Signed)

## 2021-03-11 NOTE — Progress Notes (Signed)
Subjective:    Brittany [name] is a 31 y.o. [phone] African American female who presents for a postpartum visit. She is 3 weeks postpartum following a spontaneous vaginal delivery at term gestational weeks. She is follow up today for BP check and depression screening.  BP today low normal. Pt state she has been feeling well. Admits to having some episodes of light headedness. Discussed coming of of medication 3-4 days pior to her 6 wk appointment. She is in agreement.   She admits her mood has been off, feels depresion and anxiety. She can not stop worrying , She wants to sleep all the time and has little motivation. She also notes that she has decreased appettie.   [place] Office Visit from 03/11/2021 in [facility]  PHQ-9 Total Score 11      GAD 7 : Generalized Anxiety Score 03/11/2021  Nervous, Anxious, on Edge 2  Control/stop worrying 2  Worry too much - different things 2  Trouble relaxing 2  Restless 1  Easily annoyed or irritable 3  Afraid - awful might happen 1  Total GAD 7 Score 13  Anxiety Difficulty Somewhat difficult   Discussed use of medication , she is in agreement. Orders placed for zoloft 25 mg daily.     The following portions of the patient's history were reviewed and updated as appropriate: allergies, current medications, past medical history, past surgical history and problem list.  Review of Systems Pertinent items are noted in HPI.   Vitals:   03/11/21 1426  BP: 104/71  Pulse: 78  Weight: 226 lb 3.2 oz (102.6 kg)  Height: 5\' 5"  (1.651 m)   No LMP recorded (lmp unknown).  Objective:   General:  alert, cooperative and no distress   Breasts:  deferred, no complaints  Lungs: clear to auscultation bilaterally  Heart:  regular rate and rhythm        Assessment:   Postpartum exam 3 wks s/p SVD Breast feeding Depression screening Contraception counseling   Plan:  Zoloft 25 mg daily ordered.  Follow up in: 3 weeks for PPV or  earlier if needed  Philip Aspen, CNM

## 2021-03-14 ENCOUNTER — Other Ambulatory Visit: Payer: Self-pay | Admitting: Certified Nurse Midwife

## 2021-03-23 ENCOUNTER — Other Ambulatory Visit: Payer: Self-pay | Admitting: Obstetrics and Gynecology

## 2021-04-08 ENCOUNTER — Ambulatory Visit (INDEPENDENT_AMBULATORY_CARE_PROVIDER_SITE_OTHER): Payer: Medicaid Other | Admitting: Certified Nurse Midwife

## 2021-04-08 ENCOUNTER — Other Ambulatory Visit: Payer: Self-pay

## 2021-04-08 MED ORDER — NYSTATIN 100000 UNIT/GM EX CREA
1.0000 | TOPICAL_CREAM | CUTANEOUS | 0 refills | Status: DC | PRN
Start: 2021-04-08 — End: 2022-07-29

## 2021-04-08 MED ORDER — SERTRALINE HCL 50 MG PO TABS: 50.0000 mg | ORAL_TABLET | Freq: Every day | ORAL | 6 refills | Status: DC

## 2021-04-08 NOTE — Patient Instructions (Signed)

## 2021-04-08 NOTE — Progress Notes (Signed)
Subjective:    Brittany [name] is a 31 y.o. [phone] African American female who presents for a postpartum visit. She is 6 weeks postpartum following a spontaneous vaginal delivery at 37.5 gestational weeks. Anesthesia: none. I have fully reviewed the prenatal and intrapartum course. Postpartum course has been WNL. Baby's course has been WNL. Baby is feeding by breast. She notes that baby recently had thrush and she has cracked painful nipples. ON exam right nipple red and sore, left nipple cracked area noted. Bleeding no bleeding. Bowel function is normal. Bladder function is normal. Patient is not sexually active.  Contraception method is  plans IUD  . Postpartum depression screening: positive mild. Score 9.  Last pap 11/22/2018 and was negative.  The following portions of the patient's history were reviewed and updated as appropriate: allergies, current medications, past medical history, past surgical history and problem list.  Review of Systems Pertinent items are noted in HPI.   Vitals:   04/08/21 1346  BP: 128/88  Pulse: 92  Weight: 230 lb 9.6 oz (104.6 kg)  Height: 5\' 5"  (1.651 m)   No LMP recorded (lmp unknown).  Objective:   General:  alert, cooperative and no distress   Breasts:  deferred, no complaints  Lungs: clear to auscultation bilaterally  Heart:  regular rate and rhythm  Abdomen: soft, nontender   Vulva: normal  Vagina: normal vagina  Cervix:  closed  Corpus: Well-involuted  Adnexa:  Non-palpable  Rectal Exam: no hemorrhoids        Assessment:   Postpartum exam 6 wks s/p SVD Breastfeeding Depression screening Contraception counseling   Plan:  :  plans IUD, Pt stopped procardia per our plan a week ago. BP today stable. Discontinue use. 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  Orders placed. Nystatin cream ordered for treatment of nipples.   Follow up in:  for iud placement and for Medication management or earlier if needed  , CNM

## 2021-04-16 ENCOUNTER — Other Ambulatory Visit: Payer: Self-pay | Admitting: Certified Nurse Midwife

## 2021-04-30 ENCOUNTER — Other Ambulatory Visit: Payer: Self-pay | Admitting: Obstetrics and Gynecology

## 2021-04-30 ENCOUNTER — Other Ambulatory Visit: Payer: Self-pay | Admitting: Certified Nurse Midwife

## 2021-05-01 LAB — FETAL NONSTRESS TEST

## 2021-05-04 ENCOUNTER — Other Ambulatory Visit: Payer: Self-pay | Admitting: Certified Nurse Midwife

## 2021-06-24 ENCOUNTER — Encounter: Payer: Self-pay | Admitting: Certified Nurse Midwife

## 2021-06-24 ENCOUNTER — Ambulatory Visit (INDEPENDENT_AMBULATORY_CARE_PROVIDER_SITE_OTHER): Payer: Medicaid Other | Admitting: Certified Nurse Midwife

## 2021-06-24 VITALS — BP 122/86 | HR 96 | Ht 65.0 in | Wt 231.9 lb

## 2021-06-24 DIAGNOSIS — Z30017 Encounter for initial prescription of implantable subdermal contraceptive: Secondary | ICD-10-CM | POA: Diagnosis not present

## 2021-06-24 DIAGNOSIS — Z3202 Encounter for pregnancy test, result negative: Secondary | ICD-10-CM

## 2021-06-24 DIAGNOSIS — Z30019 Encounter for initial prescription of contraceptives, unspecified: Secondary | ICD-10-CM

## 2021-06-24 LAB — POCT URINE PREGNANCY: Preg Test, Ur: NEGATIVE

## 2021-06-24 NOTE — Patient Instructions (Signed)
Nexplanon Instructions After Insertion  Keep bandage clean and dry for 24 hours  May use ice/Tylenol/Ibuprofen for soreness or pain  If you develop fever, drainage or increased warmth from incision site-contact office immediately   

## 2021-06-24 NOTE — Progress Notes (Signed)
Brittany [name] is a 31 y.o. year old Caucasian female here for Nexplanon insertion.  No LMP recorded (lmp unknown)., , and her pregnancy test today was negative.  Risks/benefits/side effects of Nexplanon have been discussed and her questions have been answered.  Specifically, a failure rate of 02/998 has been reported, with an increased failure rate if pt takes St. John's Wort and/or antiseizure medicaitons.  [name] is aware of the common side effect of irregular bleeding, which the incidence of decreases over time. ? ?BP 122/86   Pulse 96   Ht 5\' 5"  (1.651 m)   Wt 231 lb 14.4 oz (105.2 kg)   LMP  (LMP Unknown)   Breastfeeding No   BMI 38.59 kg/m?  ? ?Results for orders placed or performed in visit on 06/24/21 (from the past 24 hour(s))  ?POCT urine pregnancy  ? Collection Time: 06/24/21  3:59 PM  ?Result Value Ref Range  ? Preg Test, Ur Negative Negative  ?  ? ?She is right-handed, so her left arm, approximately 4 inches proximal from the elbow, was cleansed with alcohol and anesthetized with 2cc of 2% Lidocaine.  The area was cleansed again with betadine and the Nexplanon was inserted per manufacturer's recommendations without difficulty.  A steri-strip and pressure bandage were applied. ? ?Pt was instructed to keep the area clean and dry, remove pressure bandage in 24 hours, and keep insertion site covered with the steri-strip for 3-5 days.  Back up contraception was recommended for 2 weeks.  She was given a card indicating date Nexplanon was inserted and date it needs to be removed. Follow-up PRN problems. ? ?08/24/21 Mckinley Adelstein,CNM ?  ?

## 2021-08-22 ENCOUNTER — Other Ambulatory Visit: Payer: Self-pay | Admitting: Certified Nurse Midwife

## 2021-11-24 ENCOUNTER — Encounter: Payer: Self-pay | Admitting: Certified Nurse Midwife

## 2021-11-24 ENCOUNTER — Encounter: Payer: Self-pay | Admitting: Obstetrics and Gynecology

## 2022-01-26 ENCOUNTER — Other Ambulatory Visit: Payer: Self-pay

## 2022-05-04 ENCOUNTER — Other Ambulatory Visit: Payer: Self-pay | Admitting: Certified Nurse Midwife

## 2022-05-25 ENCOUNTER — Other Ambulatory Visit: Payer: Self-pay | Admitting: Certified Nurse Midwife

## 2022-06-08 ENCOUNTER — Encounter: Payer: Self-pay | Admitting: Obstetrics

## 2022-06-08 ENCOUNTER — Ambulatory Visit (INDEPENDENT_AMBULATORY_CARE_PROVIDER_SITE_OTHER): Payer: Medicaid Other | Admitting: Obstetrics

## 2022-06-08 VITALS — BP 131/91 | HR 77 | Wt 221.0 lb

## 2022-06-08 DIAGNOSIS — Z3046 Encounter for surveillance of implantable subdermal contraceptive: Secondary | ICD-10-CM

## 2022-06-08 DIAGNOSIS — Z3009 Encounter for other general counseling and advice on contraception: Secondary | ICD-10-CM

## 2022-06-08 DIAGNOSIS — Z3202 Encounter for pregnancy test, result negative: Secondary | ICD-10-CM

## 2022-06-08 LAB — POCT URINE PREGNANCY: Preg Test, Ur: NEGATIVE

## 2022-06-08 MED ORDER — SLYND 4 MG PO TABS: 1.0000 | ORAL_TABLET | Freq: Every day | ORAL | 3 refills | Status: DC

## 2022-06-08 NOTE — Progress Notes (Signed)
NEXPLANON REMOVAL  SUBJECTIVE Brittany [name] is a 32 y.o. [id number] who presents today for removal of her Nexplanon a. Her Nexplanon was placed 06/24/2021. She reports that this contraceptive method is making her feel very irritable and her bleeding is irregular. She plans to have another baby in the future. She has tried multiple contraceptive methods in the past. She desires a pregnancy test today.   OBJECTIVE Vitals:   06/08/22 1134 06/08/22 1135  BP: (!) 132/101 (!) 131/91  Pulse:      UPT: negative  Procedure Note Consent was obtained before beginning this procedure. The Nexplanon was palpated and the surrounding skin was prepped with chlorhexadine in sterile fashion. Adequate anesthesia was achieved with subdermal injection of 1% lidocaine. A skin incision was made over the distal aspect of the device. The capsule was lysed sharply and the device was removed with a hemostat. Hemostasis was achieved. The incision site was closed with with a steristrip and a pressure dressing was applied. Brittany [name] tolerated the procedure well.  Standard post-procedure care and precautions were reviewed. Brittany [name] verbalized understanding.   Assessment Nexplanon removal Desires contraception Hypertension  Plan -We discussed available contraceptive options. She would like to try POPs. Rx for [facility] sent to My Scripts. -Encouraged f/u with PCP for elevated BPs. PCP list and links provided.  F/u for annual visit or PRN.  Guadlupe Spanish, CNM

## 2022-06-09 ENCOUNTER — Encounter: Payer: Self-pay | Admitting: Obstetrics

## 2022-06-10 ENCOUNTER — Telehealth: Payer: Self-pay

## 2022-06-10 NOTE — Telephone Encounter (Signed)
A lady from [employer] called into triage line looking for an updated phone for Brittany [name], unfortunately, we have the same one they have. She states if she calls the office about her medication to tell her to call them [phone]

## 2022-07-27 ENCOUNTER — Ambulatory Visit (INDEPENDENT_AMBULATORY_CARE_PROVIDER_SITE_OTHER): Payer: Medicaid Other

## 2022-07-27 VITALS — BP 111/75 | HR 94 | Ht 64.0 in | Wt 226.8 lb

## 2022-07-27 DIAGNOSIS — Z3201 Encounter for pregnancy test, result positive: Secondary | ICD-10-CM

## 2022-07-27 DIAGNOSIS — N912 Amenorrhea, unspecified: Secondary | ICD-10-CM

## 2022-07-27 LAB — POCT URINE PREGNANCY: Preg Test, Ur: POSITIVE — AB

## 2022-07-27 NOTE — Progress Notes (Signed)
    NURSE VISIT NOTE  Subjective:    Patient ID: Brittany [name], female    DOB: 01/13/1991, 32 y.o.   MRN: [id number]  HPI  Patient is a 32 y.o. [id number] female who presents for evaluation of amenorrhea. She believes she could be pregnant. Pregnancy is desired. Sexual Activity: single partner, contraception: none. Current symptoms also include: breast tenderness, fatigue, frequent urination, nausea, and positive home pregnancy test. Last period was abnormal. Patient unsure of LMP, reports that she had Nexplanon removed 06/13/2022.     Objective:    BP 111/75   Pulse 94   Ht 5\' 4"  (1.626 m)   Wt 226 lb 12.8 oz (102.9 kg)   LMP  (LMP Unknown)   BMI 38.93 kg/m   Lab Review  Results for orders placed or performed in visit on 07/27/22  POCT urine pregnancy  Result Value Ref Range   Preg Test, Ur Positive (A) Negative    Assessment:   1. Absence of menstruation     Plan:   Pregnancy Test: Positive  Encouraged well-balanced diet, plenty of rest when needed, pre-natal vitamins daily and walking for exercise.  Discussed self-help for nausea, avoiding OTC medications until consulting provider or pharmacist, other than Tylenol as needed, minimal caffeine (1-2 cups daily) and avoiding alcohol.   She will schedule her nurse visit @ [redacted] wks pregnant, u/s for dating and labs @10  wk, and NOB visit at [redacted] wk pregnant.    Feel free to call with any questions.  Patient will check my chart for results. Will call with results   Fonda Kinder, CMA

## 2022-07-27 NOTE — Patient Instructions (Signed)
CONGRATULATIONS Brittany [name]!!!!!  First Trimester of Pregnancy  The first trimester of pregnancy starts on the first day of your last menstrual period until the end of week 12. This is also called months 1 through 3 of pregnancy. Body changes during your first trimester Your body goes through many changes during pregnancy. The changes usually return to normal after your baby is born. Physical changes You may gain or lose weight. Your breasts may grow larger and hurt. The area around your nipples may get darker. Dark spots or blotches may develop on your face. You may have changes in your hair. Health changes You may feel like you might vomit (nauseous), and you may vomit. You may have heartburn. You may have headaches. You may have trouble pooping (constipation). Your gums may bleed. Other changes You may get tired easily. You may pee (urinate) more often. Your menstrual periods will stop. You may not feel hungry. You may want to eat certain kinds of food. You may have changes in your emotions from day to day. You may have more dreams. Follow these instructions at home: Medicines Take over-the-counter and prescription medicines only as told by your doctor. Some medicines are not safe during pregnancy. Take a prenatal vitamin that contains at least 600 micrograms (mcg) of folic acid. Eating and drinking Eat healthy meals that include: Fresh fruits and vegetables. Whole grains. Good sources of protein, such as meat, eggs, or tofu. Low-fat dairy products. Avoid raw meat and unpasteurized juice, milk, and cheese. If you feel like you may vomit, or you vomit: Eat 4 or 5 small meals a day instead of 3 large meals. Try eating a few soda crackers. Drink liquids between meals instead of during meals. You may need to take these actions to prevent or treat trouble pooping: Drink enough fluids to keep your pee (urine) pale yellow. Eat foods that are high in fiber. These include beans,  whole grains, and fresh fruits and vegetables. Limit foods that are high in fat and sugar. These include fried or sweet foods. Activity Exercise only as told by your doctor. Most people can do their usual exercise routine during pregnancy. Stop exercising if you have cramps or pain in your lower belly (abdomen) or low back. Do not exercise if it is too hot or too humid, or if you are in a place of great height (high altitude). Avoid heavy lifting. If you choose to, you may have sex unless your doctor tells you not to. Relieving pain and discomfort Wear a good support bra if your breasts are sore. Rest with your legs raised (elevated) if you have leg cramps or low back pain. If you have bulging veins (varicose veins) in your legs: Wear support hose as told by your doctor. Raise your feet for 15 minutes, 3-4 times a day. Limit salt in your food. Safety Wear your seat belt at all times when you are in a car. Talk with your doctor if someone is hurting you or yelling at you. Talk with your doctor if you are feeling sad or have thoughts of hurting yourself. Lifestyle Do not use hot tubs, steam rooms, or saunas. Do not douche. Do not use tampons or scented sanitary pads. Do not use herbal medicines, illegal drugs, or medicines that are not approved by your doctor. Do not drink alcohol. Do not smoke or use any products that contain nicotine or tobacco. If you need help quitting, ask your doctor. Avoid cat litter boxes and soil that is used by  cats. These carry germs that can cause harm to the baby and can cause a loss of your baby by miscarriage or stillbirth. General instructions Keep all follow-up visits. This is important. Ask for help if you need counseling or if you need help with nutrition. Your doctor can give you advice or tell you where to go for help. Visit your dentist. At home, brush your teeth with a soft toothbrush. Floss gently. Write down your questions. Take them to your  prenatal visits. Where to find more information American Pregnancy Association: americanpregnancy.org [employer]: www.acog.org Office on Women's Health: [employer] Contact a doctor if: You are dizzy. You have a fever. You have mild cramps or pressure in your lower belly. You have a nagging pain in your belly area. You continue to feel like you may vomit, you vomit, or you have watery poop (diarrhea) for 24 hours or longer. You have a bad-smelling fluid coming from your vagina. You have pain when you pee. You are exposed to a disease that spreads from person to person, such as chickenpox, measles, Zika virus, HIV, or hepatitis. Get help right away if: You have spotting or bleeding from your vagina. You have very bad belly cramping or pain. You have shortness of breath or chest pain. You have any kind of injury, such as from a fall or a car crash. You have new or increased pain, swelling, or redness in an arm or leg. Summary The first trimester of pregnancy starts on the first day of your last menstrual period until the end of week 12 (months 1 through 3). Eat 4 or 5 small meals a day instead of 3 large meals. Do not smoke or use any products that contain nicotine or tobacco. If you need help quitting, ask your doctor. Keep all follow-up visits. This information is not intended to replace advice given to you by your health care provider. Make sure you discuss any questions you have with your health care provider. Document Revised: 07/11/2019 Document Reviewed: 05/17/2019 Elsevier Patient Education  2024 [employer].

## 2022-07-29 ENCOUNTER — Ambulatory Visit (INDEPENDENT_AMBULATORY_CARE_PROVIDER_SITE_OTHER): Payer: Medicaid Other

## 2022-07-29 VITALS — Wt 226.0 lb

## 2022-07-29 DIAGNOSIS — Z369 Encounter for antenatal screening, unspecified: Secondary | ICD-10-CM

## 2022-07-29 DIAGNOSIS — Z3480 Encounter for supervision of other normal pregnancy, unspecified trimester: Secondary | ICD-10-CM

## 2022-07-29 DIAGNOSIS — Z3689 Encounter for other specified antenatal screening: Secondary | ICD-10-CM

## 2022-07-29 DIAGNOSIS — O0990 Supervision of high risk pregnancy, unspecified, unspecified trimester: Secondary | ICD-10-CM | POA: Insufficient documentation

## 2022-07-29 NOTE — Patient Instructions (Signed)
First Trimester of Pregnancy  The first trimester of pregnancy starts on the first day of your last menstrual period until the end of week 12. This is also called months 1 through 3 of pregnancy. Body changes during your first trimester Your body goes through many changes during pregnancy. The changes usually return to normal after your baby is born. Physical changes You may gain or lose weight. Your breasts may grow larger and hurt. The area around your nipples may get darker. Dark spots or blotches may develop on your face. You may have changes in your hair. Health changes You may feel like you might vomit (nauseous), and you may vomit. You may have heartburn. You may have headaches. You may have trouble pooping (constipation). Your gums may bleed. Other changes You may get tired easily. You may pee (urinate) more often. Your menstrual periods will stop. You may not feel hungry. You may want to eat certain kinds of food. You may have changes in your emotions from day to day. You may have more dreams. Follow these instructions at home: Medicines Take over-the-counter and prescription medicines only as told by your doctor. Some medicines are not safe during pregnancy. Take a prenatal vitamin that contains at least 600 micrograms (mcg) of folic acid. Eating and drinking Eat healthy meals that include: Fresh fruits and vegetables. Whole grains. Good sources of protein, such as meat, eggs, or tofu. Low-fat dairy products. Avoid raw meat and unpasteurized juice, milk, and cheese. If you feel like you may vomit, or you vomit: Eat 4 or 5 small meals a day instead of 3 large meals. Try eating a few soda crackers. Drink liquids between meals instead of during meals. You may need to take these actions to prevent or treat trouble pooping: Drink enough fluids to keep your pee (urine) pale yellow. Eat foods that are high in fiber. These include beans, whole grains, and fresh fruits and  vegetables. Limit foods that are high in fat and sugar. These include fried or sweet foods. Activity Exercise only as told by your doctor. Most people can do their usual exercise routine during pregnancy. Stop exercising if you have cramps or pain in your lower belly (abdomen) or low back. Do not exercise if it is too hot or too humid, or if you are in a place of great height (high altitude). Avoid heavy lifting. If you choose to, you may have sex unless your doctor tells you not to. Relieving pain and discomfort Wear a good support bra if your breasts are sore. Rest with your legs raised (elevated) if you have leg cramps or low back pain. If you have bulging veins (varicose veins) in your legs: Wear support hose as told by your doctor. Raise your feet for 15 minutes, 3-4 times a day. Limit salt in your food. Safety Wear your seat belt at all times when you are in a car. Talk with your doctor if someone is hurting you or yelling at you. Talk with your doctor if you are feeling sad or have thoughts of hurting yourself. Lifestyle Do not use hot tubs, steam rooms, or saunas. Do not douche. Do not use tampons or scented sanitary pads. Do not use herbal medicines, illegal drugs, or medicines that are not approved by your doctor. Do not drink alcohol. Do not smoke or use any products that contain nicotine or tobacco. If you need help quitting, ask your doctor. Avoid cat litter boxes and soil that is used by cats. These carry   germs that can cause harm to the baby and can cause a loss of your baby by miscarriage or stillbirth. General instructions Keep all follow-up visits. This is important. Ask for help if you need counseling or if you need help with nutrition. Your doctor can give you advice or tell you where to go for help. Visit your dentist. At home, brush your teeth with a soft toothbrush. Floss gently. Write down your questions. Take them to your prenatal visits. Where to find more  information American Pregnancy Association: americanpregnancy.org American College of Obstetricians and Gynecologists: www.acog.org Office on Women's Health: womenshealth.gov/pregnancy Contact a doctor if: You are dizzy. You have a fever. You have mild cramps or pressure in your lower belly. You have a nagging pain in your belly area. You continue to feel like you may vomit, you vomit, or you have watery poop (diarrhea) for 24 hours or longer. You have a bad-smelling fluid coming from your vagina. You have pain when you pee. You are exposed to a disease that spreads from person to person, such as chickenpox, measles, Zika virus, HIV, or hepatitis. Get help right away if: You have spotting or bleeding from your vagina. You have very bad belly cramping or pain. You have shortness of breath or chest pain. You have any kind of injury, such as from a fall or a car crash. You have new or increased pain, swelling, or redness in an arm or leg. Summary The first trimester of pregnancy starts on the first day of your last menstrual period until the end of week 12 (months 1 through 3). Eat 4 or 5 small meals a day instead of 3 large meals. Do not smoke or use any products that contain nicotine or tobacco. If you need help quitting, ask your doctor. Keep all follow-up visits. This information is not intended to replace advice given to you by your health care provider. Make sure you discuss any questions you have with your health care provider. Document Revised: 07/11/2019 Document Reviewed: 05/17/2019 Elsevier Patient Education  2024 [employer] Commonly Asked Questions During Pregnancy  Cats: A parasite can be excreted in cat feces.  To avoid exposure you need to have another person empty the little box.  If you must empty the litter box you will need to wear gloves.  Wash your hands after handling your cat.  This parasite can also be found in raw or undercooked meat so this should also be  avoided.  Colds, Sore Throats, Flu: Please check your medication sheet to see what you can take for symptoms.  If your symptoms are unrelieved by these medications please call the office.  Dental Work: Most any dental work your dentist recommends is permitted.  X-rays should only be taken during the first trimester if absolutely necessary.  Your abdomen should be shielded with a lead apron during all x-rays.  Please notify your provider prior to receiving any x-rays.  Novocaine is fine; gas is not recommended.  If your dentist requires a note from us prior to dental work please call the office and we will provide one for you.  Exercise: Exercise is an important part of staying healthy during your pregnancy.  You may continue most exercises you were accustomed to prior to pregnancy.  Later in your pregnancy you will most likely notice you have difficulty with activities requiring balance like riding a bicycle.  It is important that you listen to your body and avoid activities that put you at a higher   risk of falling.  Adequate rest and staying well hydrated are a must!  If you have questions about the safety of specific activities ask your provider.    Exposure to Children with illness: Try to avoid obvious exposure; report any symptoms to us when noted,  If you have chicken pos, red measles or mumps, you should be immune to these diseases.   Please do not take any vaccines while pregnant unless you have checked with your OB provider.  Fetal Movement: After 28 weeks we recommend you do "kick counts" twice daily.  Lie or sit down in a calm quiet environment and count your baby movements "kicks".  You should feel your baby at least 10 times per hour.  If you have not felt 10 kicks within the first hour get up, walk around and have something sweet to eat or drink then repeat for an additional hour.  If count remains less than 10 per hour notify your provider.  Fumigating: Follow your pest control agent's  advice as to how long to stay out of your home.  Ventilate the area well before re-entering.  Hemorrhoids:   Most over-the-counter preparations can be used during pregnancy.  Check your medication to see what is safe to use.  It is important to use a stool softener or fiber in your diet and to drink lots of liquids.  If hemorrhoids seem to be getting worse please call the office.   Hot Tubs:  Hot tubs Jacuzzis and saunas are not recommended while pregnant.  These increase your internal body temperature and should be avoided.  Intercourse:  Sexual intercourse is safe during pregnancy as long as you are comfortable, unless otherwise advised by your provider.  Spotting may occur after intercourse; report any bright red bleeding that is heavier than spotting.  Labor:  If you know that you are in labor, please go to the hospital.  If you are unsure, please call the office and let us help you decide what to do.  Lifting, straining, etc:  If your job requires heavy lifting or straining please check with your provider for any limitations.  Generally, you should not lift items heavier than that you can lift simply with your hands and arms (no back muscles)  Painting:  Paint fumes do not harm your pregnancy, but may make you ill and should be avoided if possible.  Latex or water based paints have less odor than oils.  Use adequate ventilation while painting.  Permanents & Hair Color:  Chemicals in hair dyes are not recommended as they cause increase hair dryness which can increase hair loss during pregnancy.  " Highlighting" and permanents are allowed.  Dye may be absorbed differently and permanents may not hold as well during pregnancy.  Sunbathing:  Use a sunscreen, as skin burns easily during pregnancy.  Drink plenty of fluids; avoid over heating.  Tanning Beds:  Because their possible side effects are still unknown, tanning beds are not recommended.  Ultrasound Scans:  Routine ultrasounds are performed  at approximately 20 weeks.  You will be able to see your baby's general anatomy an if you would like to know the gender this can usually be determined as well.  If it is questionable when you conceived you may also receive an ultrasound early in your pregnancy for dating purposes.  Otherwise ultrasound exams are not routinely performed unless there is a medical necessity.  Although you can request a scan we ask that you pay for it when   conducted because insurance does not cover " patient request" scans.  Work: If your pregnancy proceeds without complications you may work until your due date, unless your physician or employer advises otherwise.  Round Ligament Pain/Pelvic Discomfort:  Sharp, shooting pains not associated with bleeding are fairly common, usually occurring in the second trimester of pregnancy.  They tend to be worse when standing up or when you remain standing for long periods of time.  These are the result of pressure of certain pelvic ligaments called "round ligaments".  Rest, Tylenol and heat seem to be the most effective relief.  As the womb and fetus grow, they rise out of the pelvis and the discomfort improves.  Please notify the office if your pain seems different than that described.  It may represent a more serious condition.  Common Medications Safe in Pregnancy  Acne:      Constipation:  Benzoyl Peroxide     Colace  Clindamycin      Dulcolax Suppository  Topica Erythromycin     Fibercon  Salicylic Acid      Metamucil         Miralax AVOID:        Senakot   Accutane    Cough:  Retin-A       Cough Drops  Tetracycline      Phenergan w/ Codeine if Rx  Minocycline      Robitussin (Plain & DM)  Antibiotics:     Crabs/Lice:  Ceclor       RID  Cephalosporins    AVOID:  E-Mycins      Kwell  Keflex  Macrobid/Macrodantin   Diarrhea:  Penicillin      Kao-Pectate  Zithromax      Imodium AD         PUSH FLUIDS AVOID:       Cipro     Fever:  Tetracycline      Tylenol (Regular  or Extra  Minocycline       Strength)  Levaquin      Extra Strength-Do not          Exceed 8 tabs/24 hrs Caffeine:        <200mg/day (equiv. To 1 cup of coffee or  approx. 3 12 oz sodas)         Gas: Cold/Hayfever:       Gas-X  Benadryl      Mylicon  Claritin       Phazyme  **Claritin-D        Chlor-Trimeton    Headaches:  Dimetapp      ASA-Free Excedrin  Drixoral-Non-Drowsy     Cold Compress  Mucinex (Guaifenasin)     Tylenol (Regular or Extra  Sudafed/Sudafed-12 Hour     Strength)  **Sudafed PE Pseudoephedrine   Tylenol Cold & Sinus     Vicks Vapor Rub  Zyrtec  **AVOID if Problems With Blood Pressure         Heartburn: Avoid lying down for at least 1 hour after meals  Aciphex      Maalox     Rash:  Milk of Magnesia     Benadryl    Mylanta       1% Hydrocortisone Cream  Pepcid  Pepcid Complete   Sleep Aids:  Prevacid      Ambien   Prilosec       Benadryl  Rolaids       Chamomile Tea  Tums (Limit 4/day)     Unisom           Tylenol PM         Warm milk-add vanilla or  Hemorrhoids:       Sugar for taste  Anusol/Anusol H.C.  (RX: Analapram 2.5%)  Sugar Substitutes:  Hydrocortisone OTC     Ok in moderation  Preparation H      Tucks        Vaseline lotion applied to tissue with wiping    Herpes:     Throat:  Acyclovir      Oragel  Famvir  Valtrex     Vaccines:         Flu Shot Leg Cramps:       *Gardasil  Benadryl      Hepatitis A         Hepatitis B Nasal Spray:       Pneumovax  Saline Nasal Spray     Polio Booster         Tetanus Nausea:       Tuberculosis test or PPD  Vitamin B6 25 mg TID   AVOID:    Dramamine      *Gardasil  Emetrol       Live Poliovirus  Ginger Root 250 mg QID    MMR (measles, mumps &  High Complex Carbs @ Bedtime    rebella)  Sea Bands-Accupressure    Varicella (Chickenpox)  Unisom 1/2 tab TID     *No known complications           If received before Pain:         Known pregnancy;   Darvocet       Resume series  after  Lortab        Delivery  Percocet    Yeast:   Tramadol      Femstat  Tylenol 3      Gyne-lotrimin  Ultram       Monistat  Vicodin           MISC:         All Sunscreens           Hair Coloring/highlights          Insect Repellant's          (Including DEET)         Mystic Tans  

## 2022-07-29 NOTE — Progress Notes (Signed)
New OB Intake  I connected with  Brittany [name] on 07/29/22 at  3:15 PM EDT by telephone Video Visit and verified that I am speaking with the correct person using two identifiers. Nurse is located at [facility] and pt is located at car.  I explained I am completing New OB Intake today. We discussed her EDD of 03/28/2023 that is based on LMP of approx 06/21/2022. Pt is [id number]/P6026. I reviewed her allergies, medications, Medical/Surgical/OB history, and appropriate screenings. There are no cats in the home.  Based on history, this is a/an pregnancy uncomplicated .   Patient Active Problem List   Diagnosis Date Noted   Supervision of other normal pregnancy, antepartum 07/29/2022   History of postpartum hemorrhage 05/18/2019   Asthma 02/25/2018    Concerns addressed today Pt interested in water birth; adv needs to provide her own tub.  Delivery Plans:  Plans to deliver at [facility].  Anatomy US Explained first scheduled [place] will be 6/28th and an anatomy scan will be done at 20 weeks.  Labs Discussed genetic screening with patient. Patient desires genetic testing to be drawn at new OB visit. Discussed possible labs to be drawn at new OB appointment.  COVID Vaccine Patient has had COVID vaccine.   Social Determinants of Health Food Insecurity: expresses food insecurity. Information given on local food banks. WIC Referral: Patient is interested in referral to [employer]  Transportation: Patient denies transportation needs. Childcare: Discussed no children allowed at ultrasound appointments.   First visit review I reviewed new OB appt with pt. I explained she will have ob bloodwork and pap smear/pelvic exam if indicated. Explained pt will be seen by Hartley Barefoot, CNM at first visit; encounter routed to appropriate provider.   [name], [facility] 07/29/2022  3:47 PM

## 2022-08-13 ENCOUNTER — Other Ambulatory Visit: Payer: Self-pay | Admitting: Certified Nurse Midwife

## 2022-08-13 ENCOUNTER — Ambulatory Visit (INDEPENDENT_AMBULATORY_CARE_PROVIDER_SITE_OTHER): Payer: Medicaid Other

## 2022-08-13 DIAGNOSIS — Z3A01 Less than 8 weeks gestation of pregnancy: Secondary | ICD-10-CM | POA: Diagnosis not present

## 2022-08-13 DIAGNOSIS — Z3687 Encounter for antenatal screening for uncertain dates: Secondary | ICD-10-CM

## 2022-08-13 DIAGNOSIS — O30001 Twin pregnancy, unspecified number of placenta and unspecified number of amniotic sacs, first trimester: Secondary | ICD-10-CM | POA: Diagnosis not present

## 2022-08-13 DIAGNOSIS — Z369 Encounter for antenatal screening, unspecified: Secondary | ICD-10-CM

## 2022-08-13 DIAGNOSIS — Z3480 Encounter for supervision of other normal pregnancy, unspecified trimester: Secondary | ICD-10-CM

## 2022-09-17 ENCOUNTER — Other Ambulatory Visit: Payer: Self-pay | Admitting: Certified Nurse Midwife

## 2022-09-22 ENCOUNTER — Telehealth: Payer: Self-pay | Admitting: Licensed Practical Nurse

## 2022-09-22 NOTE — Telephone Encounter (Signed)
I contacted the patient via phone and about scheduling Change for Friday, 8/9 with Carie Caddy at 2:55 pm. I left message advising the patient of scheduling change to contacted the office.

## 2022-09-24 ENCOUNTER — Encounter: Payer: Medicaid Other | Admitting: Licensed Practical Nurse

## 2022-09-29 ENCOUNTER — Ambulatory Visit (INDEPENDENT_AMBULATORY_CARE_PROVIDER_SITE_OTHER): Payer: Medicaid Other | Admitting: Advanced Practice Midwife

## 2022-09-29 ENCOUNTER — Other Ambulatory Visit (HOSPITAL_COMMUNITY)
Admission: RE | Admit: 2022-09-29 | Discharge: 2022-09-29 | Disposition: A | Discharge status: Home / Self Care | Payer: Medicaid Other | Source: Ambulatory Visit | Attending: Licensed Practical Nurse | Admitting: Licensed Practical Nurse

## 2022-09-29 ENCOUNTER — Encounter: Payer: Self-pay | Admitting: Advanced Practice Midwife

## 2022-09-29 VITALS — BP 114/75 | HR 109 | Wt 233.0 lb

## 2022-09-29 DIAGNOSIS — O99331 Smoking (tobacco) complicating pregnancy, first trimester: Secondary | ICD-10-CM

## 2022-09-29 DIAGNOSIS — F1291 Cannabis use, unspecified, in remission: Secondary | ICD-10-CM

## 2022-09-29 DIAGNOSIS — Z369 Encounter for antenatal screening, unspecified: Secondary | ICD-10-CM | POA: Diagnosis present

## 2022-09-29 DIAGNOSIS — O0992 Supervision of high risk pregnancy, unspecified, second trimester: Secondary | ICD-10-CM | POA: Diagnosis present

## 2022-09-29 DIAGNOSIS — O99210 Obesity complicating pregnancy, unspecified trimester: Secondary | ICD-10-CM | POA: Insufficient documentation

## 2022-09-29 DIAGNOSIS — Z3A13 13 weeks gestation of pregnancy: Secondary | ICD-10-CM | POA: Insufficient documentation

## 2022-09-29 DIAGNOSIS — Z113 Encounter for screening for infections with a predominantly sexual mode of transmission: Secondary | ICD-10-CM | POA: Diagnosis present

## 2022-09-29 DIAGNOSIS — Z124 Encounter for screening for malignant neoplasm of cervix: Secondary | ICD-10-CM

## 2022-09-29 DIAGNOSIS — F17210 Nicotine dependence, cigarettes, uncomplicated: Secondary | ICD-10-CM | POA: Diagnosis not present

## 2022-09-29 DIAGNOSIS — O99212 Obesity complicating pregnancy, second trimester: Secondary | ICD-10-CM

## 2022-09-29 DIAGNOSIS — O30049 Twin pregnancy, dichorionic/diamniotic, unspecified trimester: Secondary | ICD-10-CM

## 2022-09-29 DIAGNOSIS — Z131 Encounter for screening for diabetes mellitus: Secondary | ICD-10-CM

## 2022-09-29 NOTE — Progress Notes (Signed)
[facility]  New Obstetric Patient H&P    Chief Complaint: "Desires prenatal care"   History of Present Illness: Patient is a 32 y.o. [id number] Not Hispanic or Latino female, presents with amenorrhea and positive home pregnancy test. Patient's last menstrual period was 06/21/2022 (approximate). and based on 6 week ultrasound, her EDD is Estimated Date of Delivery: 04/05/23 and her EGA is [redacted]w[redacted]d. She had Nexplanon removed in April and had one cycle in May. Her last pap smear was 4 years ago and was no abnormalities.    She had a urine pregnancy test which was positive 2 month(s)  ago. Her last menstrual period was normal and lasted for  3 or 4 day(s). Since her LMP she claims she has experienced brown discharge when wiping a couple of times.  Her past medical history is noncontributory. Her prior pregnancies are notable for  6 full term uncomplicated vaginal deliveries.  Since her LMP, she admits to the use of tobacco products  yes and cutting down She claims she has gained  7  pounds since the start of her pregnancy.  There are cats in the home in the home  no  She admits close contact with children on a regular basis  yes  She has had chicken pox in the past yes She has had Tuberculosis exposures, symptoms, or previously tested positive for TB   no Current or past history of domestic violence. no  Genetic Screening/Teratology Counseling: (Includes patient, baby's father, or anyone in either family with:)   1. Patient's age >/= 69 at [facility]  no 2. Thalassemia ([place], [place], Mediterranean, or Asian background): MCV<80  no 3. Neural tube defect (meningomyelocele, spina bifida, anencephaly)  no 4. Congenital heart defect  no  5. Down syndrome  no 6. Tay-Sachs (Jewish, [place]  no 7. Canavan's Disease  no 8. Sickle cell disease or trait (African)  no  9. Hemophilia or other blood disorders  no  10. Muscular dystrophy  no  11. Cystic fibrosis  no  12. Huntington's Chorea  no  13.  Mental retardation/autism  no 14. Other inherited genetic or chromosomal disorder  no 15. Maternal metabolic disorder (DM, PKU, etc)  no 16. Patient or FOB with a child with a birth defect not listed above no  16a. Patient or FOB with a birth defect themselves no 17. Recurrent pregnancy loss, or stillbirth  no  18. Any medications since LMP other than prenatal vitamins (include vitamins, supplements, OTC meds, drugs, alcohol)  albuterol, zoloft 19. Any other genetic/environmental exposure to discuss  no  Infection History:   1. Lives with someone with TB or TB exposed  no  2. Patient or partner has history of genital herpes  no 3. Rash or viral illness since LMP  no 4. History of STI (GC, CT, HPV, syphilis, HIV)  remote history GC/CT/Tr 5. History of recent travel :  no  Other pertinent information:  no    Review of Systems:10 point review of systems negative unless otherwise noted in HPI  Past Medical History:  Patient Active Problem List   Diagnosis Date Noted Date Diagnosed   Dichorionic diamniotic twin pregnancy, antepartum 09/29/2022    Obesity affecting pregnancy 09/29/2022    Supervision of high risk pregnancy, antepartum 07/29/2022      Clinical Staff Provider  Office Location  [facility] Dating  By 6 week u/s  Language  English Anatomy US    Flu Vaccine  offer Genetic Screen  NIPS:  TDaP vaccine   offer Hgb A1C or  GTT Early : Hgb A1C Third trimester :   Covid declined   LAB RESULTS   Rhogam     Blood Type     Feeding Plan breast Antibody    Contraception tubal Rubella    Circumcision yes RPR     Pediatrician  [facility]     Support Person [facility]    Prenatal Classes  Varicella     GBS  (For PCN allergy, check sensitivities)   BTL Consent  Hep C     VBAC Consent  Pap Diagnosis  Date Value Ref Range Status  11/22/2018   Final   - Negative for Intraepithelial Lesions or Malignancy (NILM)  11/22/2018 - Benign reactive/reparative changes  Final       Hgb Electro      CF      SMA             History of postpartum hemorrhage 05/18/2019    Asthma 02/25/2018     Past Surgical History:  Past Surgical History:  Procedure Laterality Date   MULTIPLE TOOTH EXTRACTIONS      Gynecologic History: Patient's last menstrual period was 06/21/2022 (approximate).  Obstetric History: [id number]  Family History:  Family History  Problem Relation Age of Onset   Healthy Mother    Cancer Father        lung   Asthma Father    Hypertension Father    Heart disease Father    Lung disease Father    COPD Father    Healthy Brother    Healthy Brother    Healthy Brother    Hypertension Maternal Grandmother    Cancer Maternal Grandmother        cancer dx 2017   Diabetes Maternal Grandmother    Cancer Maternal Grandfather        unknown   Anesthesia problems Neg Hx    Other Neg Hx     Social History:  Social History   Socioeconomic History   Marital status: Single    Spouse name: Not on file   Number of children: 6   Years of education: 12   Highest education level: Not on file  Occupational History   Occupation: [employer]  Tobacco Use   Smoking status: Every Day    Current packs/day: 0.00    Average packs/day: 0.3 packs/day for 5.0 years (1.3 ttl pk-yrs)    Types: Cigarettes    Start date: 12/28/2007    Last attempt to quit: 12/27/2012    Years since quitting: 9.7   Smokeless tobacco: Never  Vaping Use   Vaping status: Never Used  Substance and Sexual Activity   Alcohol use: No   Drug use: Not Currently    Types: Marijuana, Other-see comments    Comment: quit marijuana 2 weeks ago   Sexual activity: Yes    Partners: Male    Birth control/protection: None    Comment: last intercourse 4 days ago  Other Topics Concern   Not on file  Social History Narrative   Not on file   Social Determinants of Health   Financial Resource Strain: Medium Risk (07/29/2022)   Overall Financial Resource Strain (CARDIA)    Difficulty of  Paying Living Expenses: Somewhat hard  Food Insecurity: Food Insecurity Present (07/29/2022)   Hunger Vital Sign    Worried About Running Out of Food in the Last Year: Sometimes true    Ran Out of Food in  the Last Year: Often true  Transportation Needs: No Transportation Needs (07/29/2022)   PRAPARE - Administrator, Civil Service (Medical): No    Lack of Transportation (Non-Medical): No  Physical Activity: Sufficiently Active (07/29/2022)   Exercise Vital Sign    Days of Exercise per Week: 2 days    Minutes of Exercise per Session: 120 min  Stress: Stress Concern Present (07/29/2022)   [employer] of Occupational Health - Occupational Stress Questionnaire    Feeling of Stress : To some extent  Social Connections: Moderately Integrated (07/29/2022)   Social Connection and Isolation Panel [NHANES]    Frequency of Communication with Friends and Family: More than three times a week    Frequency of Social Gatherings with Friends and Family: Once a week    Attends Religious Services: 1 to 4 times per year    Active Member of [employer] or Organizations: No    Attends Banker Meetings: Never    Marital Status: Living with partner  Intimate Partner Violence: Not At Risk (07/29/2022)   Humiliation, Afraid, Rape, and Kick questionnaire    Fear of Current or Ex-Partner: No    Emotionally Abused: No    Physically Abused: No    Sexually Abused: No    Allergies:  Allergies  Allergen Reactions   Iodine Other (See Comments)    blistering   Sulfonamide Derivatives Other (See Comments)    blistering    Medications: Prior to Admission medications   Medication Sig Start Date End Date Taking? Authorizing Provider  albuterol (PROVENTIL) (2.5 MG/3ML) 0.083% nebulizer solution Take 2.5 mg by nebulization every 6 (six) hours as needed. 10/23/14  Yes [provider]  fluticasone (FLOVENT HFA) 110 MCG/ACT inhaler INHALE 1 PUFF INTO THE LUNGS TWICE A DAY 09/17/22  Yes  Doreene Burke, CNM  Prenatal Multivit-Min-Fe-FA (PRE-NATAL PO) Take by mouth.   Yes [provider]  sertraline (ZOLOFT) 50 MG tablet TAKE 1 TABLET BY MOUTH EVERY DAY 04/30/21  Yes Doreene Burke, CNM    Physical Exam Vitals: Blood pressure 114/75, pulse (!) 109, weight 233 lb (105.7 kg), last menstrual period 06/21/2022  General: NAD HEENT: normocephalic, anicteric Thyroid: no enlargement, no palpable nodules Pulmonary: No increased work of breathing, CTAB Cardiovascular: RRR, distal pulses 2+ Abdomen: NABS, soft, non-tender, non-distended.  Umbilicus without lesions.  No hepatomegaly, splenomegaly or masses palpable. No evidence of hernia  Genitourinary:  External: Normal external female genitalia.  Normal urethral meatus, normal Bartholin's and Skene's glands.    Vagina: Normal vaginal mucosa, no evidence of prolapse.    Cervix: Grossly normal in appearance, no bleeding Extremities: no edema, erythema, or tenderness Neurologic: Grossly intact Psychiatric: mood appropriate, affect full   The following were addressed during this visit:  Breastfeeding Education - Early initiation of breastfeeding    Comments: Keeps milk supply adequate, helps contract uterus and slow bleeding, and early milk is the perfect first food and is easy to digest.   - The importance of exclusive breastfeeding    Comments: Provides antibodies, Lower risk of breast and ovarian cancers, and type-2 diabetes,Helps your body recover, Reduced chance of SIDS.   - The importance of early skin-to-skin contact    Comments:  Keeps baby warm and secure, helps keep baby's blood sugar up and breathing steady, easier to bond and breastfeed, and helps calm baby.  - Rooming-in on a 24-hour basis    Comments: Easier to learn baby's feeding cues, easier to bond and get to know  each other, and encourages milk production.   - Feeding on demand or baby-led feeding    Comments: Helps prevent breastfeeding  complications, helps bring in good milk supply, prevents under or overfeeding, and helps baby feel content and satisfied   - Frequent feeding to help assure optimal milk production    Comments: Making a full supply of milk requires frequent removal of milk from breasts, infant will eat 8-12 times in 24 hours, if separated from infant use breast massage, hand expression and/ or pumping to remove milk from breasts.   - Exclusive breastfeeding for the first 6 months    Comments: Builds a healthy milk supply and keeps it up, protects baby from sickness and disease, and breastmilk has everything your baby needs for the first 6 months.  - Individualized Education    Comments: Contraindications to breastfeeding and other special medical conditions Patient has experience breastfeeding 4 of her children     Assessment: 23 y.o. [id number] at [redacted]w[redacted]d presenting to initiate prenatal care  Plan: 1) Avoid alcoholic beverages. 2) Patient encouraged not to smoke.  3) Discontinue the use of all non-medicinal drugs and chemicals.  4) Take prenatal vitamins daily.  5) Nutrition, food safety (fish, cheese advisories, and high nitrite foods) and exercise discussed. 6) Hospital and practice style discussed with cross coverage system.  7) Genetic Screening, such as with 1st Trimester Screening, cell free fetal DNA, AFP testing, and Ultrasound, as well as with amniocentesis and CVS as appropriate, is discussed with patient. At the conclusion of today's visit patient requested genetic testing 8) Patient is asked about travel to areas at risk for the Zika virus, and counseled to avoid travel and exposure to mosquitoes or sexual partners who may have themselves been exposed to the virus. Testing is discussed, and will be ordered as appropriate.  9) NOB panel, urine culture, UDS, PAPtima, Hgb A1C today 10) Will need MFM Anatomy scan in about 7 weeks- orders placed 11) Lab/RN only visit needed for Panorama testing-  order placed   Tresea Mall, CNM [facility] 09/29/2022 5:24 PM

## 2022-09-29 NOTE — Patient Instructions (Signed)
Multiple Pregnancy Multiple pregnancy means that a woman is carrying more than one baby at a time. She may be pregnant with twins, triplets, or more. Most multiple pregnancies are twins. It is rare for a woman to get pregnant naturally with triplets or more (higher-order multiples). Multiple pregnancies come with more risks than single pregnancies. A woman with a multiple pregnancy is more likely to have certain problems during her pregnancy. How does a multiple pregnancy happen? A multiple pregnancy happens when: Your body releases more than one egg at a time. Then each egg is fertilized by a different sperm. This is the most common type of multiple pregnancy. This is more likely to happen if you are older when you become pregnant. The twins or multiples produced this way are called fraternal. They are no more alike than non-multiple siblings. One sperm fertilizes one egg. The egg then divides into more than one embryo. The twins or multiples produced this way are called identical. They are always the same gender. They also look a lot alike. Who is most likely to have a multiple pregnancy? A multiple pregnancy is more likely to happen if: You have had fertility treatment. This is especially true if you used fertility medicines. You are older than 32 years of age. You have already had four or more children. You have had a previous multiple pregnancy. You have a family history of multiple pregnancy. How is a multiple pregnancy diagnosed? A multiple pregnancy may be diagnosed based on symptoms. These may include: Rapid weight gain in the first 3 months of pregnancy (first trimester). More severe nausea and breast tendernessthan expected in a single pregnancy. A larger uterus than what is normal for the stage of pregnancy. You may also have tests, including: Blood tests to detect a higher-than-expected level of human chorionic gonadotropin (hCG). hCG is a hormone that your body makes in early  pregnancy. An ultrasound exam. This is used to confirm you are carrying multiples. What risks come with multiple pregnancy? A multiple pregnancy puts you at higher risk for certain problems during or after your pregnancy. These include: Delivering your babies before your due date (preterm birth). A full-term pregnancy lasts for at least 37 weeks. Babies born before 37 weeks may be more likely to have breathing problems, trouble feeding, and physical and learning disabilities. Gestational diabetes. This is high blood sugar only during pregnancy. Preeclampsia. This is a serious condition that causes high blood pressure and headaches during pregnancy. Too much blood loss after childbirth (postpartum hemorrhage). Postpartum depression. Babies with low birth weight. How will having a multiple pregnancy affect my care? Your health care team will monitor you more closely. You may need more prenatal visits and ultrasounds. These will ensure that you and your babies are healthy. Follow these instructions at home: Eating and drinking Improve your nutrition and increase your calorie intake. Follow instructions from your health care provider about weight gain. You may need to gain extra weight when you are pregnant with multiples. Eat healthy snacks often throughout the day. This will add calories and reduce nausea. Do not drink alcohol. Drink enough fluid to keep your urine pale yellow. Take prenatal vitamins. Ask your health care provider what vitamins are right for you. Activity You may need to limit your activities and how much you exercise. This will depend on how your pregnancy progresses and if you have any complications. Follow instructions from your health care provider about what activities are safe for you. Your health care provider  may instruct you to: Rest often. Avoid activities, exercise, and work that take a lot of effort. General instructions Do not use any products that contain  nicotine or tobacco. These products include cigarettes, chewing tobacco, and vaping devices, such as e-cigarettes. If you need help quitting, ask your health care provider. Do not use illegal drugs. Take over-the-counter and prescription medicines only as told by your health care provider. Arrange for extra help around the house. Keep all follow-up visits and all prenatal visits due to the risks that come with a multiple pregnancy. Where to find more information [employer]: acog.org March of Dimes: marchofdimes.org Contact a health care provider if: You feel dizzy. You have nausea, vomiting, or diarrhea that does not go away. You feel depressed or have other emotions that interfere with your normal activities. You notice increased swelling in your face, hands, legs, or ankles. You have a fever. You have pain when you urinate or bad-smelling discharge from your vagina. You have a severe headache, with or without changes in vision. Get help right away if: You have blood or fluid leaking from your vagina. You feel cramping or pressure in your pelvis. You have pain in your abdomen or lower back. You are having regular contractions. You have chest pain or shortness of breath. Your babies move less often, or do not move at all. Summary Multiple pregnancy means that a woman is carrying more than one baby at a time. Multiple pregnancy puts you at higher risk for certain problems, such as preterm birth, gestational diabetes, or too much blood loss after childbirth. You will be monitored by your health care team. You may have more testing done. Do not use any products with tobacco or nicotine, drink alcohol, or use any illegal drugs. Let your health care provider know if you have any problems during your pregnancy. Keep all prenatal visits. This information is not intended to replace advice given to you by your health care provider. Make sure you discuss any  questions you have with your health care provider. Document Revised: 05/15/2021 Document Reviewed: 05/15/2021 Elsevier Patient Education  2024 [employer] Prenatal Care Prenatal care is health care during pregnancy. It helps you and your unborn baby (fetus) stay as healthy as possible. Prenatal care may be provided by a midwife, a family practice doctor, a Dispensing optician (nurse practitioner or physician assistant), or a childbirth and pregnancy doctor (obstetrician). How does this affect me? During pregnancy, you will be closely monitored for any new conditions that might develop. To lower your risk of pregnancy complications, you and your health care provider will talk about any underlying conditions you have. How does this affect my baby? Early and consistent prenatal care increases the chance that your baby will be healthy during pregnancy. Prenatal care lowers the risk that your baby will be: Born early (prematurely). Smaller than expected at birth (small for gestational age). What can I expect at the first prenatal care visit? Your first prenatal care visit will likely be the longest. You should schedule your first prenatal care visit as soon as you know that you are pregnant. Your first visit is a good time to talk about any questions or concerns you have about pregnancy. Medical history At your visit, you and your health care provider will talk about your medical history, including: Any past pregnancies. Your family's medical history. Medical history of the baby's father. Any long-term (chronic) health conditions you have and how you manage them. Any  surgeries or procedures you have had. Any current over-the-counter or prescription medicines, herbs, or supplements that you are taking. Other factors that could pose a risk to your baby, including: Exposure to harmful chemicals or radiation at work or at home. Any substance use, including tobacco, alcohol, and drug use. Your  home setting and your stress levels, including: Exposure to abuse or violence. Household financial strain. Your daily health habits, including diet and exercise. Tests and screenings Your health care provider will: Measure your weight, height, and blood pressure. Do a physical exam, including a pelvic and breast exam. Perform blood tests and urine tests to check for: Urinary tract infection. Sexually transmitted infections (STIs). Low iron levels in your blood (anemia). Blood type and certain proteins on red blood cells (Rh antibodies). Infections and immunity to viruses, such as hepatitis B and rubella. HIV (human immunodeficiency virus). Discuss your options for genetic screening. Tips about staying healthy Your health care provider will also give you information about how to keep yourself and your baby healthy, including: Nutrition and taking vitamins. Physical activity. How to manage pregnancy symptoms such as nausea and vomiting (morning sickness). Infections and substances that may be harmful to your baby and how to avoid them. Food safety. Dental care. Working. Travel. Warning signs to watch for and when to call your health care provider. How often will I have prenatal care visits? After your first prenatal care visit, you will have regular visits throughout your pregnancy. The visit schedule is often as follows: Up to week 28 of pregnancy: once every 4 weeks. 28-36 weeks: once every 2 weeks. After 36 weeks: every week until delivery. Some women may have visits more or less often depending on any underlying health conditions and the health of the baby. Keep all follow-up and prenatal care visits. This is important. What happens during routine prenatal care visits? Your health care provider will: Measure your weight and blood pressure. Check for fetal heart sounds. Measure the height of your uterus in your abdomen (fundal height). This may be measured starting around week  20 of pregnancy. Check the position of your baby inside your uterus. Ask questions about your diet, sleeping patterns, and whether you can feel the baby move. Review warning signs to watch for and signs of labor. Ask about any pregnancy symptoms you are having and how you are dealing with them. Symptoms may include: Headaches. Nausea and vomiting. Vaginal discharge. Swelling. Fatigue. Constipation. Changes in your vision. Feeling persistently sad or anxious. Any discomfort, including back or pelvic pain. Bleeding or spotting. Make a list of questions to ask your health care provider at your routine visits. What tests might I have during prenatal care visits? You may have blood, urine, and imaging tests throughout your pregnancy, such as: Urine tests to check for glucose, protein, or signs of infection. Glucose tests to check for a form of diabetes that can develop during pregnancy (gestational diabetes mellitus). This is usually done around week 24 of pregnancy. Ultrasounds to check your baby's growth and development, to check for birth defects, and to check your baby's well-being. These can also help to decide when you should deliver your baby. A test to check for group B strep (GBS) infection. This is usually done around week 36 of pregnancy. Genetic testing. This may include blood, fluid, or tissue sampling, or imaging tests, such as an ultrasound. Some genetic tests are done during the first trimester and some are done during the second trimester. What else can I  expect during prenatal care visits? Your health care provider may recommend getting certain vaccines during pregnancy. These may include: A yearly flu shot (annual influenza vaccine). This is especially important if you will be pregnant during flu season. Tdap (tetanus, diphtheria, pertussis) vaccine. Getting this vaccine during pregnancy can protect your baby from whooping cough (pertussis) after birth. This vaccine may be  recommended between weeks 27 and 36 of pregnancy. A COVID-19 vaccine. Later in your pregnancy, your health care provider may give you information about: Childbirth and breastfeeding classes. Choosing a health care provider for your baby. Umbilical cord banking. Breastfeeding. Birth control after your baby is born. The hospital labor and delivery unit and how to set up a tour. Registering at the hospital before you go into labor. Where to find more information Office on Women's Health: [employer]Lesson.ca American Pregnancy Association: americanpregnancy.org March of Dimes: marchofdimes.org Summary Prenatal care helps you and your baby stay as healthy as possible during pregnancy. Your first prenatal care visit will most likely be the longest. You will have visits and tests throughout your pregnancy to monitor your health and your baby's health. Bring a list of questions to your visits to ask your health care provider. Make sure to keep all follow-up and prenatal care visits. This information is not intended to replace advice given to you by your health care provider. Make sure you discuss any questions you have with your health care provider. Document Revised: 11/15/2019 Document Reviewed: 11/15/2019 Elsevier Patient Education  2024 Elsevier Inc. Exercise During Pregnancy Exercise is an important part of being healthy for people of all ages. Exercise improves the function of your heart and lungs and helps you maintain strength, flexibility, and a healthy body weight. Exercise also boosts energy levels and elevates mood. Most women should exercise regularly during pregnancy. Exercise routines may need to change as your pregnancy progresses. In rare cases, women with certain medical conditions or complications may be asked to limit or avoid exercise during pregnancy. Your health care provider will give you information on what will work for you. How does this affect me? Along with maintaining  general strength and flexibility, exercising during pregnancy can help: Keep strength in muscles that are used during labor and childbirth. Decrease low back pain or symptoms of depression. Control weight gain during pregnancy. Reduce the risk of needing insulin if you develop diabetes during pregnancy. Decrease the risk of cesarean delivery. Speed up your recovery after giving birth. Relieve constipation. How does this affect my baby? Exercise can help you have a healthy pregnancy. Exercise does not cause early (premature) birth. It will not cause your baby to weigh less at birth. What exercises can I do? Many exercises are safe for you to do during pregnancy. Do a variety of exercises that safely increase your heart and breathing rates and help you build and maintain muscle strength. Do exercises exactly as told by your health care provider. You may do these exercises: Walking. Swimming. Water aerobics. Riding a stationary bike. Modified yoga or Pilates. Tell your instructor that you are pregnant. Avoid overstretching, and avoid lying on your back for long periods of time. Running or jogging. Choose this type of exercise only if: You ran or jogged regularly before your pregnancy. You can run or jog and still talk in complete sentences. What exercises should I avoid? You may be told to limit high-intensity exercise depending on your level of fitness and whether you exercised regularly before you were pregnant. You can tell that you  are exercising at a high intensity if you are breathing much harder and faster and cannot hold a conversation while exercising. You must avoid: Contact sports. Activities that put you at risk for falling on or being hit in the belly, such as downhill skiing, waterskiing, surfing, rock climbing, cycling, gymnastics, and horseback riding. Scuba diving. Skydiving. Hot yoga or hot Pilates. These activities take place in a room that is heated to high  temperatures. Jogging or running, unless you jogged or ran regularly before you were pregnant. While jogging or running, you should always be able to talk in full sentences. Do not run or jog so fast that you are unable to have a conversation. Do not exercise at more than 6,000 feet above sea level (high elevation) if you are not used to exercising at high elevation. How do I exercise in a safe way?  Avoid overheating. Do not exercise in very high temperatures. Wear loose-fitting, breathable clothes. Avoid dehydration. Drink enough fluid before, during, and after exercise to keep your urine pale yellow. Avoid overstretching. Because of hormone changes during pregnancy, it is easy to overstretch muscles, tendons, and ligaments. Start slowly and ask your health care provider to recommend the types of exercise that are safe for you. Do not exercise to lose weight. Wear a sports bra to support your breasts. Avoid standing still or lying flat on your back as much as you can. Follow these instructions at home: Exercise on most days or all days of the week. Try to exercise for 30 minutes a day, 5 days a week, unless your health care provider tells you not to. If you actively exercised before your pregnancy and you are healthy, your health care provider may tell you to continue to do moderate-intensity to high-intensity exercise. If you are just starting to exercise or did not exercise much before your pregnancy, your health care provider may tell you to do low-intensity to moderate-intensity exercise. Questions to ask your health care provider Is exercise safe for me? What are signs that I should stop exercising? Does my health condition mean that I should not exercise during pregnancy? When should I avoid exercising during pregnancy? Stop exercising and contact a health care provider if: You have any unusual symptoms such as: Mild contractions of the uterus or cramps in the abdomen. A dizzy  feeling that does not go away when you rest. Stop exercising and get help right away if: You have any unusual symptoms such as: Sudden, severe pain in your low back or your belly. Regular, painful contractions of your uterus. Chest pain. Bleeding or fluid leaking from your vagina. Shortness of breath. Headache. Pain and swelling of your calves. Summary Most women should exercise regularly throughout pregnancy. In rare cases, women with certain medical conditions or complications may be asked to limit or avoid exercise during pregnancy. Do not exercise to lose weight during pregnancy. Your health care provider will tell you what level of physical activity is right for you. Stop exercising and contact a health care provider if you have unusual symptoms, such as mild contractions or dizziness. This information is not intended to replace advice given to you by your health care provider. Make sure you discuss any questions you have with your health care provider. Document Revised: 09/19/2019 Document Reviewed: 09/19/2019 Elsevier Patient Education  2024 Elsevier Inc. Eating Plan for Pregnant Women While you are pregnant, your body requires additional nutrition to help support your growing baby. You also have a higher need for  some vitamins and minerals, such as folic acid, calcium, iron, and vitamin D. Eating a healthy, well-balanced diet is very important for your health and your baby's health. Your need for extra calories varies over the course of your pregnancy. Pregnancy is divided into three trimesters, with each trimester lasting 3 months. For most women, it is recommended to consume: 150 extra calories a day during the first trimester. 300 extra calories a day during the second trimester. 300 extra calories a day during the third trimester. What are tips for following this plan? Cooking Practice good food safety and cleanliness. Wash your hands before you eat and after you prepare raw  meat. Wash all fruits and vegetables well before peeling or eating. Taking these actions can help to prevent foodborne illnesses that can be very dangerous to your baby, such as listeriosis. Ask your health care provider for more information about listeriosis. Make sure that all meats, poultry, and eggs are cooked to food-safe temperatures or "well-done." Meal planning  Eat a variety of foods (especially fruits and vegetables) to get a full range of vitamins and minerals. Two or more servings of fish are recommended each week in order to get the most benefits from omega-3 fatty acids that are found in seafood. Choose fish that are lower in mercury, such as salmon and pollock. Limit your overall intake of foods that have "empty calories." These are foods that have little nutritional value, such as sweets, desserts, candies, and sugar-sweetened beverages. Drinks that contain caffeine are okay to drink, but it is better to avoid caffeine. Keep your total caffeine intake to less than 200 mg each day (which is 12 oz or 355 mL of coffee, tea, or soda) or the limit as told by your health care provider. General information Do not try to lose weight or go on a diet during pregnancy. Take a prenatal vitamin to help meet your additional vitamin and mineral needs during pregnancy, specifically for folic acid, iron, calcium, and vitamin D. Remember to stay active. Ask your health care provider what types of exercise and activities are safe for you. What does 150 extra calories look like? Healthy options that provide 150 extra calories each day could be any of the following: 6-8 oz (170-227 g) plain low-fat yogurt with  cup (70 g) berries. 1 apple with 2 tsp (11 g) peanut butter. Cut-up vegetables with  cup (60 g) hummus. 8 fl oz (237 mL) low-fat chocolate milk. 1 stick of string cheese with 1 medium orange. 1 peanut butter and jelly sandwich that is made with one slice of whole-wheat bread and 1 tsp (5 g) of  peanut butter. For 300 extra calories, you could eat two of these healthy options each day. What is a healthy amount of weight to gain? The right amount of weight gain for you is based on your BMI (body mass index) before you became pregnant. If your BMI was less than 18 (underweight), you should gain 28-40 lb (13-18 kg). If your BMI was 18-24.9 (normal), you should gain 25-35 lb (11-16 kg). If your BMI was 25-29.9 (overweight), you should gain 15-25 lb (7-11 kg). If your BMI was 30 or greater (obese), you should gain 11-20 lb (5-9 kg). What if I am having twins or multiples? Generally, if you are carrying twins or multiples: You may need to eat 300-600 extra calories a day. The recommended range for total weight gain is 25-54 lb (11-25 kg), depending on your BMI before pregnancy. Talk with your  health care provider to find out about nutritional needs, weight gain, and exercise that is right for you. What foods should I eat?  Fruits All fruits. Eat a variety of colors and types of fruit. Remember to wash your fruits well before peeling or eating. Vegetables All vegetables. Eat a variety of colors and types of vegetables. Remember to wash your vegetables well before peeling or eating. Grains All grains. Choose whole grains, such as whole-wheat bread, oatmeal, or brown rice. Meats and other protein foods Lean meats, including chicken, [place], and lean cuts of beef, veal, or pork. Fish that is higher in omega-3 fatty acids and lower in mercury, such as salmon, herring, mussels, trout, sardines, pollock, shrimp, crab, and lobster. Tofu. Tempeh. Beans. Eggs. Peanut butter and other nut butters. Dairy Pasteurized milk and milk alternatives, such as almond milk. Pasteurized yogurt and pasteurized cheese. Cottage cheese. Sour cream. Beverages Water. Juices that contain 100% fruit juice or vegetable juice. Caffeine-free teas and decaffeinated coffee. Fats and oils Fats and oils are okay to include  in moderation. Sweets and desserts Sweets and desserts are okay to include in moderation. Seasoning and other foods All pasteurized condiments. The items listed above may not be a complete list of foods and beverages you can eat. Contact a dietitian for more information. What foods should I avoid? Fruits Raw (unpasteurized) fruit juices. Vegetables Unpasteurized vegetable juices. Meats and other protein foods Precooked or cured meat, such as bologna, hot dogs, sausages, or meat loaves. (If you must eat those meats, reheat them until they are steaming hot.) Refrigerated pate, meat spreads from a meat counter, or smoked seafood that is found in the refrigerated section of a store. Raw or undercooked meats, poultry, and eggs. Raw fish, such as sushi or sashimi. Fish that have high mercury content, such as tilefish, shark, swordfish, and king mackerel. Dairy Unpasteurized milk and any foods that have unpasteurized milk in them. Soft cheeses, such as feta, queso blanco, queso fresco, [place], [place], panela, and blue-veined cheeses (unless they are made with pasteurized milk, which must be stated on the label). Beverages Alcohol. Sugar-sweetened beverages, such as sodas, teas, or energy drinks. Seasoning and other foods Homemade fermented foods and drinks, such as pickles, sauerkraut, or kombucha drinks. (Store-bought pasteurized versions of these are okay.) Salads that are made in a store or deli, such as ham salad, chicken salad, egg salad, tuna salad, and seafood salad. The items listed above may not be a complete list of foods and beverages you should avoid. Contact a dietitian for more information. Where to find more information To calculate the number of calories you need based on your height, weight, and activity level, you can use an online calculator such as: PayStrike.dk To calculate how much weight you should gain during pregnancy, you can use an online pregnancy weight  gain calculator such as: http://www.harvey.com/ To learn more about eating fish during pregnancy, talk with your health care provider or visit: PumpkinSearch.com.ee Summary While you are pregnant, your body requires additional nutrition to help support your growing baby. Eat a variety of foods, especially fruits and vegetables, to get a full range of vitamins and minerals. Practice good food safety and cleanliness. Wash your hands before you eat and after you prepare raw meat. Wash all fruits and vegetables well before peeling or eating. Taking these actions can help to prevent foodborne illnesses, such as listeriosis, that can be very dangerous to your baby. Do not eat raw meat or fish. Do not eat  fish that have high mercury content, such as tilefish, shark, swordfish, and king mackerel. Do not eat raw (unpasteurized) dairy. Take a prenatal vitamin to help meet your additional vitamin and mineral needs during pregnancy, specifically for folic acid, iron, calcium, and vitamin D. This information is not intended to replace advice given to you by your health care provider. Make sure you discuss any questions you have with your health care provider. Document Revised: 08/30/2019 Document Reviewed: 08/30/2019 Elsevier Patient Education  2024 [employer].

## 2022-09-30 ENCOUNTER — Telehealth: Payer: Self-pay | Admitting: Advanced Practice Midwife

## 2022-09-30 LAB — RPR+RH+ABO+RUB AB+AB SCR+CB...
Antibody Screen: NEGATIVE
HIV Screen 4th Generation wRfx: NONREACTIVE
Hematocrit: 34 % (ref 34.0–46.6)
Hemoglobin: 11.5 g/dL (ref 11.1–15.9)
Hepatitis B Surface Ag: NEGATIVE
MCH: 28.4 pg (ref 26.6–33.0)
MCHC: 33.8 g/dL (ref 31.5–35.7)
MCV: 84 fL (ref 79–97)
Platelets: 265 10*3/uL (ref 150–450)
RBC: 4.05 x10E6/uL (ref 3.77–5.28)
RDW: 13.5 % (ref 11.7–15.4)
RPR Ser Ql: NONREACTIVE
Rh Factor: POSITIVE
Rubella Antibodies, IGG: 1.01 {index} (ref >0.99)
Varicella zoster IgG: 1273 {index} (ref >165)
WBC: 8.5 10*3/uL (ref 3.4–10.8)

## 2022-09-30 LAB — HGB A1C W/O EAG: Hgb A1c MFr Bld: 5.4 % (ref 4.8–5.6)

## 2022-09-30 NOTE — Telephone Encounter (Addendum)
I contacted the patient via phone, She needs an 7 weeks ROB from 09/29/22 with Tresea Mall. Per Erskine Squibb the patient needs MFM ultrasound and follow up in 7 weeks. The patient also needs nurse visit for panorama draw  appointment. I left voicemail for the patient to call back.

## 2022-09-30 NOTE — Addendum Note (Signed)
Addended by: Kathlene Cote on: 09/30/2022 04:49 PM   Modules accepted: Orders

## 2022-10-01 ENCOUNTER — Encounter: Payer: Medicaid Other | Admitting: Obstetrics

## 2022-10-01 LAB — CYTOLOGY - PAP
Chlamydia: NEGATIVE
Comment: NEGATIVE
Comment: NEGATIVE
Comment: NEGATIVE
Comment: NORMAL
Diagnosis: NEGATIVE
High risk HPV: NEGATIVE
Neisseria Gonorrhea: NEGATIVE
Trichomonas: NEGATIVE

## 2022-10-01 LAB — URINE CULTURE

## 2022-10-02 LAB — URINE DRUG PANEL 7
Amphetamines, Urine: NEGATIVE ng/mL
Barbiturate Quant, Ur: NEGATIVE ng/mL
Benzodiazepine Quant, Ur: NEGATIVE ng/mL
Cannabinoid Quant, Ur: POSITIVE — AB
Cocaine (Metab.): NEGATIVE ng/mL
Opiate Quant, Ur: NEGATIVE ng/mL
PCP Quant, Ur: NEGATIVE ng/mL

## 2022-10-03 ENCOUNTER — Inpatient Hospital Stay (HOSPITAL_COMMUNITY)
Admission: AD | Admit: 2022-10-03 | Discharge: 2022-10-03 | Disposition: A | Discharge status: Home / Self Care | Payer: Medicaid Other | Attending: Obstetrics and Gynecology | Admitting: Obstetrics and Gynecology

## 2022-10-03 DIAGNOSIS — Z3A13 13 weeks gestation of pregnancy: Secondary | ICD-10-CM | POA: Diagnosis not present

## 2022-10-03 DIAGNOSIS — Z6790 Unspecified blood type, Rh positive: Secondary | ICD-10-CM

## 2022-10-03 DIAGNOSIS — O4691 Antepartum hemorrhage, unspecified, first trimester: Secondary | ICD-10-CM | POA: Diagnosis present

## 2022-10-03 DIAGNOSIS — O30041 Twin pregnancy, dichorionic/diamniotic, first trimester: Secondary | ICD-10-CM | POA: Insufficient documentation

## 2022-10-03 DIAGNOSIS — O30049 Twin pregnancy, dichorionic/diamniotic, unspecified trimester: Secondary | ICD-10-CM

## 2022-10-03 DIAGNOSIS — O209 Hemorrhage in early pregnancy, unspecified: Secondary | ICD-10-CM

## 2022-10-03 LAB — URINALYSIS, ROUTINE W REFLEX MICROSCOPIC
Bacteria, UA: NONE SEEN
Bilirubin Urine: NEGATIVE
Glucose, UA: NEGATIVE mg/dL
Ketones, ur: NEGATIVE mg/dL
Leukocytes,Ua: NEGATIVE
Nitrite: NEGATIVE
Protein, ur: NEGATIVE mg/dL
Specific Gravity, Urine: 1.01 (ref 1.005–1.030)
pH: 6 (ref 5.0–8.0)

## 2022-10-03 LAB — WET PREP, GENITAL
Sperm: NONE SEEN
Trich, Wet Prep: NONE SEEN
WBC, Wet Prep HPF POC: >=10 — AB (ref <10)
Yeast Wet Prep HPF POC: NONE SEEN

## 2022-10-03 MED ORDER — SILVER NITRATE-POT NITRATE 75-25 % EX MISC: 1.0000 | Freq: Once | CUTANEOUS | Status: AC

## 2022-10-03 MED ORDER — SILVER NITRATE-POT NITRATE 75-25 % EX MISC
CUTANEOUS | Status: AC
  Administered 2022-10-03: 1 via TOPICAL
  Filled 2022-10-03: qty 10

## 2022-10-03 NOTE — Discharge Instructions (Signed)
[facility]   [facility]             [address], [place], [place] [address] [phone]  [facility]                                                             [address], [address], [place], [place], [address] [phone]  [facility]                                    [address], [address], [place], [place], [address] [phone]  [facility] [place] [address], [place], [place], [address] [phone]  [facility]                                 [address], [place], [place], [address] [phone]  [facility]                                    [address], [place], [place], [address] [phone]  [facility] [address], [address], [place], [place], [address]                              [facility] [address], [place], , [address] [phone]  [facility]         Phone: [phone]  [facility]      Phone: [phone]   [facility]      Phone: [phone]  [facility]         Phone: [phone]   [facility]    Phone: [phone]  [facility]      Phone: [phone]  [facility]      Phone: [phone]  [facility]        Phone: [phone]  [facility] (Sheronette Cousins) [phone]  [facility]      Phone: [phone]   

## 2022-10-03 NOTE — MAU Note (Signed)
..  Brittany [name] is a 32 y.o. at [redacted]w[redacted]d here in MAU reporting: woke up and had dark red blood on her legs, a few minutes ago she went to the bathroom and it was pink when she wiped.  Denies abdominal pain.  Last intercourse: 4 days ago  Pain score: 0/10 Vitals:   10/03/22 0438  BP: 119/82  Pulse: 86  Resp: 17  Temp: 98.2 F (36.8 C)  SpO2: 98%     ZOX:WRUEAV to doppler twins with single doppler.  Lab orders placed from triage:  UA

## 2022-10-03 NOTE — MAU Provider Note (Signed)
Chief Complaint: Vaginal Bleeding   Event Date/Time   First Provider Initiated Contact with Patient 10/03/22 0545      SUBJECTIVE HPI: Brittany [name] is a 32 y.o. [id number] at [redacted]w[redacted]d by early ultrasound with di/di twins pregnancy who presents to maternity admissions reporting onset of dark red bleeding this morning at 0300. Blood was all over the sheets and running down her legs.  She took a shower and came in to MAU.  There are no other symptoms.     HPI  Past Medical History:  Diagnosis Date   Asthma    Chlamydia    Gonorrhea    Hx MRSA infection 02/16/2003   Trichimoniasis    Past Surgical History:  Procedure Laterality Date   MULTIPLE TOOTH EXTRACTIONS     Social History   Socioeconomic History   Marital status: Single    Spouse name: Not on file   Number of children: 6   Years of education: 12   Highest education level: Not on file  Occupational History   Occupation: [employer]  Tobacco Use   Smoking status: Every Day    Current packs/day: 0.00    Average packs/day: 0.3 packs/day for 5.0 years (1.3 ttl pk-yrs)    Types: Cigarettes    Start date: 12/28/2007    Last attempt to quit: 12/27/2012    Years since quitting: 9.7   Smokeless tobacco: Never  Vaping Use   Vaping status: Never Used  Substance and Sexual Activity   Alcohol use: No   Drug use: Not Currently    Types: Marijuana, Other-see comments    Comment: quit marijuana 2 weeks ago   Sexual activity: Yes    Partners: Male    Birth control/protection: None    Comment: last intercourse 4 days ago  Other Topics Concern   Not on file  Social History Narrative   Not on file   Social Determinants of Health   Financial Resource Strain: Medium Risk (07/29/2022)   Overall Financial Resource Strain (CARDIA)    Difficulty of Paying Living Expenses: Somewhat hard  Food Insecurity: Food Insecurity Present (07/29/2022)   Hunger Vital Sign    Worried About Running Out of Food in the Last Year: Sometimes  true    Ran Out of Food in the Last Year: Often true  Transportation Needs: No Transportation Needs (07/29/2022)   PRAPARE - Administrator, Civil Service (Medical): No    Lack of Transportation (Non-Medical): No  Physical Activity: Sufficiently Active (07/29/2022)   Exercise Vital Sign    Days of Exercise per Week: 2 days    Minutes of Exercise per Session: 120 min  Stress: Stress Concern Present (07/29/2022)   [employer] of Occupational Health - Occupational Stress Questionnaire    Feeling of Stress : To some extent  Social Connections: Moderately Integrated (07/29/2022)   Social Connection and Isolation Panel [NHANES]    Frequency of Communication with Friends and Family: More than three times a week    Frequency of Social Gatherings with Friends and Family: Once a week    Attends Religious Services: 1 to 4 times per year    Active Member of [employer] or Organizations: No    Attends Banker Meetings: Never    Marital Status: Living with partner  Intimate Partner Violence: Not At Risk (07/29/2022)   Humiliation, Afraid, Rape, and Kick questionnaire    Fear of Current or Ex-Partner: No    Emotionally Abused: No  Physically Abused: No    Sexually Abused: No   No current facility-administered medications on file prior to encounter.   Current Outpatient Medications on File Prior to Encounter  Medication Sig Dispense Refill   albuterol (PROVENTIL) (2.5 MG/3ML) 0.083% nebulizer solution Take 2.5 mg by nebulization every 6 (six) hours as needed.     fluticasone (FLOVENT HFA) 110 MCG/ACT inhaler INHALE 1 PUFF INTO THE LUNGS TWICE A DAY 12 each 1   Prenatal Multivit-Min-Fe-FA (PRE-NATAL PO) Take by mouth.     sertraline (ZOLOFT) 50 MG tablet TAKE 1 TABLET BY MOUTH EVERY DAY 90 tablet 3   Allergies  Allergen Reactions   Iodine Other (See Comments)    blistering   Sulfonamide Derivatives Other (See Comments)    blistering    ROS:  Review of Systems   Constitutional:  Negative for chills, fatigue and fever.  Respiratory:  Negative for shortness of breath.   Cardiovascular:  Negative for chest pain.  Gastrointestinal:  Negative for abdominal pain.  Genitourinary:  Positive for vaginal bleeding. Negative for difficulty urinating, dysuria, flank pain, pelvic pain, vaginal discharge and vaginal pain.  Neurological:  Negative for dizziness and headaches.  Psychiatric/Behavioral: Negative.       I have reviewed patient's Past Medical Hx, Surgical Hx, Family Hx, Social Hx, medications and allergies.   Physical Exam  Patient Vitals for the past 24 hrs:  BP Temp Temp src Pulse Resp SpO2 Height Weight  10/03/22 0438 119/82 98.2 F (36.8 C) Oral 86 17 98 % 5\' 5"  (1.651 m) 107.1 kg   Constitutional: Well-developed, well-nourished female in no acute distress.  Cardiovascular: normal rate Respiratory: normal effort GI: Abd soft, non-tender. Pos BS x 4 MS: Extremities nontender, no edema, normal ROM Neurologic: Alert and oriented x 4.  GU: Neg CVAT.  PELVIC EXAM: Cervix pink, visually closed, small laceration at 11 o'clock position on internal os. Scant brown bleeding initially but laceration friable to cotton swab.    Bimanual exam: Cervix 0/long/high, firm, anterior, neg CMT, uterus nontender, nonenlarged, adnexa without tenderness, enlargement, or mass  Bedside US by CNM with FHR 150s x 2  Limited OB US Date: 10/03/22 EDD :   04/05/23 based on 6 week [place] Viability:  FHT detected CRL measurement x2 c/w previous dates Subjectively normal amniotic fluid  Pt informed that the ultrasound is considered a limited OB ultrasound and is not intended to be a complete ultrasound exam.  Patient also informed that the ultrasound is not being completed with the intent of assessing for fetal or placental anomalies or any pelvic abnormalities.  Explained that the purpose of today's ultrasound is to assess for  viability.  Patient acknowledges the purpose  of the exam and the limitations of the study.     LAB RESULTS Results for orders placed or performed during the hospital encounter of 10/03/22 (from the past 24 hour(s))  Urinalysis, Routine w reflex microscopic -Urine, Clean Catch     Status: Abnormal   Collection Time: 10/03/22  4:31 AM  Result Value Ref Range   Color, Urine YELLOW YELLOW   APPearance CLEAR CLEAR   Specific Gravity, Urine 1.010 1.005 - 1.030   pH 6.0 5.0 - 8.0   Glucose, UA NEGATIVE NEGATIVE mg/dL   Hgb urine dipstick MODERATE (A) NEGATIVE   Bilirubin Urine NEGATIVE NEGATIVE   Ketones, ur NEGATIVE NEGATIVE mg/dL   Protein, ur NEGATIVE NEGATIVE mg/dL   Nitrite NEGATIVE NEGATIVE   Leukocytes,Ua NEGATIVE NEGATIVE   RBC / HPF  0-5 0 - 5 RBC/hpf   WBC, UA 0-5 0 - 5 WBC/hpf   Bacteria, UA NONE SEEN NONE SEEN   Squamous Epithelial / HPF 0-5 0 - 5 /HPF   Mucus PRESENT   Wet prep, genital     Status: Abnormal   Collection Time: 10/03/22  5:46 AM   Specimen: PATH Cytology Cervicovaginal Ancillary Only  Result Value Ref Range   Yeast Wet Prep HPF POC NONE SEEN NONE SEEN   Trich, Wet Prep NONE SEEN NONE SEEN   Clue Cells Wet Prep HPF POC PRESENT (A) NONE SEEN   WBC, Wet Prep HPF POC >=10 (A) <10   Sperm NONE SEEN     O/Positive/-- (08/14 1103)  IMAGING No results found.  MAU Management/MDM: Orders Placed This Encounter  Procedures   Wet prep, genital   Urinalysis, Routine w reflex microscopic -Urine, Clean Catch   Discharge patient    Meds ordered this encounter  Medications   silver nitrate applicators 75-25 % applicator    Daphine Deutscher, Dea F: cabinet override   silver nitrate applicators applicator 1 Stick    Bedside US with viable IUP x 2 without abnormality.  Bleeding appears to be from small cervical laceration. No other acute findings.  Silver nitrate applied to friable area.  Pt to f/u with OB/Gyn provider as scheduled. She may be interested in [facility] practices so list of providers given.  Return to  MAU as needed for emergencies  ASSESSMENT 1. Vaginal bleeding in pregnancy, first trimester   2. Dichorionic diamniotic twin pregnancy, antepartum   3. [redacted] weeks gestation of pregnancy   4. Blood type, Rh positive     PLAN Discharge home Allergies as of 10/03/2022       Reactions   Iodine Other (See Comments)   blistering   Sulfonamide Derivatives Other (See Comments)   blistering        Medication List     TAKE these medications    albuterol (2.5 MG/3ML) 0.083% nebulizer solution Commonly known as: PROVENTIL Take 2.5 mg by nebulization every 6 (six) hours as needed.   fluticasone 110 MCG/ACT inhaler Commonly known as: FLOVENT HFA INHALE 1 PUFF INTO THE LUNGS TWICE A DAY   PRE-NATAL PO Take by mouth.   sertraline 50 MG tablet Commonly known as: ZOLOFT TAKE 1 TABLET BY MOUTH EVERY DAY        Follow-up Information     [facility] Follow up.   Why: As scheduled Contact information: [address] [place] [address] [phone]        [facility] Follow up.   Specialty: Obstetrics and Gynecology Why: As needed for emergencies Contact information: [address] [place] [address] [phone]                Sharen Counter Certified Nurse-Midwife 10/03/2022  6:08 AM

## 2022-10-04 LAB — GC/CHLAMYDIA PROBE AMP (~~LOC~~) NOT AT ARMC
Chlamydia: NEGATIVE
Comment: NEGATIVE
Comment: NORMAL
Neisseria Gonorrhea: NEGATIVE

## 2022-10-07 ENCOUNTER — Telehealth: Payer: Self-pay

## 2022-10-07 NOTE — Telephone Encounter (Signed)
Pt calling for rx for lower back and round ligament pain.  [phone]  LM will sent mychart msg as well.

## 2022-10-08 NOTE — Telephone Encounter (Signed)
I spoke with the patient via phone, She was very upset that for blood work for the [employer] genetic testing draw wasn't drawn at the last visit. She stated "she is really frustrated and thought she had already had this done and has been waiting for the results". I asked this patient would she like to have this drawn at her next appointment. The patient declined. She said "she will wait for her ultrasound and that this had put a damper on what she had already scheduled". She also said "she can't keeping taking off work".  I apologized to the patient and explain I understand her frustration! The patient knows her order is in and to call to scheduled an nurse visit if she changes her mind.

## 2022-10-08 NOTE — Telephone Encounter (Signed)
Called pt and apologized for the inconvenience. Order was not pulled and Andrey Campanile is not here to help [place] out of why it was not pulled. Pt is schedule for Monday 10/11/22 nurse visit for Panorama test.

## 2022-10-11 ENCOUNTER — Ambulatory Visit: Payer: Medicaid Other

## 2022-10-11 VITALS — BP 127/85 | HR 74 | Ht 65.0 in | Wt 238.1 lb

## 2022-10-11 DIAGNOSIS — Z1379 Encounter for other screening for genetic and chromosomal anomalies: Secondary | ICD-10-CM

## 2022-10-11 NOTE — Progress Notes (Signed)
Patient presents today for Panorama blood draw.

## 2022-10-22 ENCOUNTER — Telehealth: Payer: Self-pay

## 2022-10-22 NOTE — Telephone Encounter (Signed)
Spoke with Jill Alexanders an [place] who stated patients results processed today and should release this afternoon or tomorrow. Called patient and made her aware. She is grateful for the call and awaiting the results.

## 2022-10-22 NOTE — Telephone Encounter (Signed)
Patient calling about her Brittany [name] results. I told patient that I would send message to you and have you call her back regarding results.

## 2022-10-27 ENCOUNTER — Encounter: Payer: Self-pay | Admitting: Licensed Practical Nurse

## 2022-10-27 ENCOUNTER — Ambulatory Visit (INDEPENDENT_AMBULATORY_CARE_PROVIDER_SITE_OTHER): Payer: Medicaid Other | Admitting: Licensed Practical Nurse

## 2022-10-27 ENCOUNTER — Encounter: Payer: Medicaid Other | Admitting: Certified Nurse Midwife

## 2022-10-27 VITALS — BP 131/96 | HR 81 | Wt 237.9 lb

## 2022-10-27 DIAGNOSIS — O30049 Twin pregnancy, dichorionic/diamniotic, unspecified trimester: Secondary | ICD-10-CM

## 2022-10-27 DIAGNOSIS — O162 Unspecified maternal hypertension, second trimester: Secondary | ICD-10-CM

## 2022-10-27 DIAGNOSIS — Z3A17 17 weeks gestation of pregnancy: Secondary | ICD-10-CM

## 2022-10-27 LAB — POCT URINALYSIS DIPSTICK
Bilirubin, UA: NEGATIVE
Blood, UA: NEGATIVE
Glucose, UA: NEGATIVE
Ketones, UA: NEGATIVE
Leukocytes, UA: NEGATIVE
Nitrite, UA: NEGATIVE
Protein, UA: NEGATIVE
Spec Grav, UA: 1.015 (ref 1.010–1.025)
Urobilinogen, UA: 0.2 U/dL
pH, UA: 7 (ref 5.0–8.0)

## 2022-10-27 NOTE — Progress Notes (Signed)
Routine Prenatal Care Visit  Subjective  Brittany [name] is a 32 y.o. [id number] at [redacted]w[redacted]d being seen today for ongoing prenatal care.  She is currently monitored for the following issues for this high-risk pregnancy and has Asthma; History of postpartum hemorrhage; Supervision of high risk pregnancy, antepartum; Dichorionic diamniotic twin pregnancy, antepartum; and Obesity affecting pregnancy on their problem list.  ----------------------------------------------------------------------------------- Patient reports fatigue.  -Mood pt states she "does not always know what her mood is like" gets irritable easily. Stopped Zoloft a few months because she thought it was not helping, was only on 50mg . Would like to try Zoloft again, will start with 25mg  x1 wk then increase to 50mg  daily, then consider increasing dose at next ROB  -BP elevated today, states she has had high readings in previous pregnancies then the BP would be normal, has never been on medications. Reviewed  readings with Dr Logan Bores, will have pt return in 1 week for BP check, will need to be started on Procardia XL 30mg  daily  and labs if elevated. Pt has cuff at home, she is monitor and home bring in log and cuff to next visit.  -has had episodes of vaginal bleeding, was seen at [facility] and the MAU, was told she had a [facility].  -has apt with MFM 9/25  -Starting to feel movement -has a 15, 12, 10, 9, 3 and 1 year at home.   Contractions: Not present. Vag. Bleeding: None.  Movement: Present. Leaking Fluid denies.  ----------------------------------------------------------------------------------- The following portions of the patient's history were reviewed and updated as appropriate: allergies, current medications, past family history, past medical history, past social history, past surgical history and problem list. Problem list updated.  Objective  Blood pressure (!) 132/101, pulse 82, weight 237 lb 14.4 oz (107.9 kg), last menstrual period  06/21/2022, not currently breastfeeding. Pregravid weight 226 lb (102.5 kg) Total Weight Gain 11 lb 14.4 oz (5.398 kg) Urinalysis: Urine Protein    Urine Glucose    Fetal Status: Fetal Heart Rate (bpm): 156, 160   Movement: Present     General:  Alert, oriented and cooperative. Patient is in no acute distress.  Skin: Skin is warm and dry. No rash noted.   Cardiovascular: Normal heart rate noted  Respiratory: Normal respiratory effort, no problems with respiration noted  Abdomen: Soft, gravid, appropriate for gestational age. Pain/Pressure: Present     Pelvic:  Cervical exam deferred        Extremities: Normal range of motion.  Edema: None  Mental Status: Normal mood and affect. Normal behavior. Normal judgment and thought content.   Assessment   32 y.o. [id number] at [redacted]w[redacted]d by  04/05/2023, by Ultrasound presenting for routine prenatal visit  Plan   nine Problems (from 07/29/22 to present)     Problem Noted Resolved   Supervision of high risk pregnancy, antepartum 07/29/2022 by [name], CMA No   Overview Addendum 10/07/2022  5:50 PM by Tresea Mall, CNM     Clinical Staff Provider  Office Location  [facility] Dating  By 6 week u/s  Language  English Anatomy US    Flu Vaccine  offer Genetic Screen  NIPS:   TDaP vaccine   offer Hgb A1C or  GTT Early : Hgb A1C 5.4 Third trimester :   Covid declined   LAB RESULTS   Rhogam  O/Positive/-- (08/14 1103)  Blood Type O/Positive/-- (08/14 1103)   Feeding Plan breast Antibody Negative (08/14 1103)  Contraception tubal Rubella 1.01 (08/14 1103)  Circumcision yes RPR Non Reactive (08/14 1103)   Pediatrician  [facility] HBsAg Negative (08/14 1103)   Support Person Cody HIV Non Reactive (08/14 1103)  Prenatal Classes  Varicella     GBS  (For PCN allergy, check sensitivities)   BTL Consent  Hep C     VBAC Consent  Pap Diagnosis  Date Value Ref Range Status  09/29/2022   Final   - Negative for intraepithelial lesion or malignancy  (NILM)      Hgb Electro      CF      SMA                    Preterm labor symptoms and general obstetric precautions including but not limited to vaginal bleeding, contractions, leaking of fluid and fetal movement were reviewed in detail with the patient. Please refer to After Visit Summary for other counseling recommendations.   Return for BP check in 1 week .  Carie Caddy, CNM  [facility]  10/27/22  3:32 PM

## 2022-11-03 ENCOUNTER — Ambulatory Visit: Payer: Medicaid Other

## 2022-11-03 NOTE — Progress Notes (Deleted)
NURSE VISIT NOTE  Subjective:    Patient ID: Brittany [name], female    DOB: [birth year], 32 y.o.   MRN: [id number]  HPI  Patient is a 32 y.o. [id number] female who presents for BP check per order from Carie Caddy, [place].   Patient reports she has not been prescribed BP medication.    BP Readings from Last 3 Encounters:  10/27/22 (!) 131/96  10/11/22 127/85  10/03/22 129/87   Pulse Readings from Last 3 Encounters:  10/27/22 81  10/11/22 74  10/03/22 73    Objective:    LMP 06/21/2022 (Approximate)   Assessment:   No diagnosis found.   Plan:   Per Dr. Hildred Laser, MD:  {[id number]:Y}  Patient verbalized understanding of instructions.   Loney Laurence, CMA

## 2022-11-10 ENCOUNTER — Ambulatory Visit: Payer: Medicaid Other | Attending: Advanced Practice Midwife

## 2022-11-10 ENCOUNTER — Other Ambulatory Visit: Payer: Self-pay

## 2022-11-10 ENCOUNTER — Ambulatory Visit: Payer: Medicaid Other

## 2022-11-10 VITALS — BP 131/86 | HR 98 | Temp 97.7°F | Resp 16 | Ht 64.0 in | Wt 242.0 lb

## 2022-11-10 DIAGNOSIS — O30042 Twin pregnancy, dichorionic/diamniotic, second trimester: Secondary | ICD-10-CM | POA: Insufficient documentation

## 2022-11-10 DIAGNOSIS — O0990 Supervision of high risk pregnancy, unspecified, unspecified trimester: Secondary | ICD-10-CM

## 2022-11-10 DIAGNOSIS — O358XX2 Maternal care for other (suspected) fetal abnormality and damage, fetus 2: Secondary | ICD-10-CM | POA: Diagnosis not present

## 2022-11-10 DIAGNOSIS — Z3A19 19 weeks gestation of pregnancy: Secondary | ICD-10-CM | POA: Diagnosis not present

## 2022-11-10 DIAGNOSIS — O99212 Obesity complicating pregnancy, second trimester: Secondary | ICD-10-CM | POA: Insufficient documentation

## 2022-11-10 DIAGNOSIS — O0942 Supervision of pregnancy with grand multiparity, second trimester: Secondary | ICD-10-CM

## 2022-11-10 DIAGNOSIS — O30049 Twin pregnancy, dichorionic/diamniotic, unspecified trimester: Secondary | ICD-10-CM

## 2022-11-10 DIAGNOSIS — O0940 Supervision of pregnancy with grand multiparity, unspecified trimester: Secondary | ICD-10-CM | POA: Insufficient documentation

## 2022-11-10 DIAGNOSIS — O365922 Maternal care for other known or suspected poor fetal growth, second trimester, fetus 2: Secondary | ICD-10-CM | POA: Insufficient documentation

## 2022-11-10 DIAGNOSIS — O4402 Placenta previa specified as without hemorrhage, second trimester: Secondary | ICD-10-CM

## 2022-11-10 DIAGNOSIS — O09292 Supervision of pregnancy with other poor reproductive or obstetric history, second trimester: Secondary | ICD-10-CM | POA: Insufficient documentation

## 2022-11-10 DIAGNOSIS — O4102X2 Oligohydramnios, second trimester, fetus 2: Secondary | ICD-10-CM | POA: Diagnosis not present

## 2022-11-10 DIAGNOSIS — O4103X2 Oligohydramnios, third trimester, fetus 2: Secondary | ICD-10-CM

## 2022-11-10 DIAGNOSIS — E669 Obesity, unspecified: Secondary | ICD-10-CM

## 2022-11-15 ENCOUNTER — Other Ambulatory Visit: Payer: Self-pay | Admitting: Obstetrics

## 2022-11-15 DIAGNOSIS — O30042 Twin pregnancy, dichorionic/diamniotic, second trimester: Secondary | ICD-10-CM

## 2022-11-15 DIAGNOSIS — O365922 Maternal care for other known or suspected poor fetal growth, second trimester, fetus 2: Secondary | ICD-10-CM

## 2022-11-15 NOTE — Progress Notes (Signed)
MFM Note  Brittany [name] was seen due to a spontaneously conceived twin pregnancy at 19 weeks and 1 day.  She had a first trimester ultrasound performed in your office during which time her [facility] was changed to April 05, 2023 and a probable dichorionic, diamniotic twin gestation was noted.   The patient is a grand multipara with 6 prior uncomplicated full-term vaginal deliveries.    She denies any significant past medical history and denies any problems in her current pregnancy.    The patient had a cell free DNA test earlier in her pregnancy which indicated a low risk for trisomy 66, 18, and 13.  These are predicted to be dizygotic/fraternal twins.  Two female fetuses are predicted.    Twin A: Appropriate fetal growth and normal amniotic fluid.  No obvious fetal anomalies noted.  Normal-appearing posterior placenta with complete placenta previa.  Twin B: Small for gestational age (EFW 3rd percentile), oligohydramnios, probable small anterior placenta.  Twin B appears to be visually smaller than twin A.  Doppler studies of the umbilical arteries for twin B showed intermittent absent end-diastolic flow.  The views of the fetal anatomy for twin B were limited due to oligohydramnios and the small fetal size.  The following were discussed during today's consultation:  Early onset IUGR with oligohydramnios in twin B  As her cell free DNA test indicates that this is a dizygotic twin gestation and as a thick dividing membrane was noted during her first trimester ultrasound, today's findings are not consistent with the twin to twin transfusion syndrome which usually occurs in monozygotic/monochorionic twins.  As a normal-appearing filled fetal bladder was noted in twin B and at least one fetal kidney was visualized today, the oligohydramnios noted today is unlikely related to renal agenesis.  She was advised that today's findings in twin B are most likely related to placental dysfunction/placental  insufficiency.  The probable anterior placenta in twin B appeared extremely small.  She was advised that the most likely outcome for twin B is a fetal demise at some point in her pregnancy.    She was reassured that as this is most likely a dichorionic, diamniotic twin gestation, the demise of one twin will probably not have any effects on the remaining live fetus.  Should a demise of twin B be noted during her future ultrasound exams, we will treat her pregnancy as a singleton from that time on.   The increased risk of preeclampsia due to placental dysfunction and a twin gestation was discussed.    She was advised to start taking 2 tablets of baby aspirin (81 mg each) for preeclampsia prophylaxis.  We will continue to follow her with frequent ultrasound exams for fetal assessment.  Complete posterior placenta previa  The patient has 6 prior vaginal deliveries.  She denies any prior uterine surgeries or cesarean deliveries.  She was reassured that most cases of placenta previa noted early in pregnancy will resolve later in her pregnancy.  We will continue to assess the placental location during her future exams.  A follow-up exam was scheduled in our office in 2 weeks for fetal assessment.    The patient and her partner stated that all of their questions were answered today.  They stated that they understood everything that was discussed with them.  A total of 45 minutes was spent counseling and coordinating the care for this patient.  Greater than 50% of the time was spent in direct face-to-face contact.

## 2022-11-16 ENCOUNTER — Ambulatory Visit (INDEPENDENT_AMBULATORY_CARE_PROVIDER_SITE_OTHER): Payer: Medicaid Other | Admitting: Obstetrics & Gynecology

## 2022-11-16 VITALS — BP 113/63 | HR 91 | Wt 239.0 lb

## 2022-11-16 DIAGNOSIS — O30049 Twin pregnancy, dichorionic/diamniotic, unspecified trimester: Secondary | ICD-10-CM

## 2022-11-16 DIAGNOSIS — O0990 Supervision of high risk pregnancy, unspecified, unspecified trimester: Secondary | ICD-10-CM

## 2022-11-16 DIAGNOSIS — Z641 Problems related to multiparity: Secondary | ICD-10-CM | POA: Insufficient documentation

## 2022-11-16 DIAGNOSIS — Z3A20 20 weeks gestation of pregnancy: Secondary | ICD-10-CM

## 2022-11-16 DIAGNOSIS — O99212 Obesity complicating pregnancy, second trimester: Secondary | ICD-10-CM

## 2022-11-16 DIAGNOSIS — O4402 Placenta previa specified as without hemorrhage, second trimester: Secondary | ICD-10-CM | POA: Insufficient documentation

## 2022-11-16 LAB — POCT URINALYSIS DIPSTICK OB
Bilirubin, UA: NEGATIVE
Blood, UA: NEGATIVE
Glucose, UA: NEGATIVE
Ketones, UA: NEGATIVE
Leukocytes, UA: NEGATIVE
POC,PROTEIN,UA: NEGATIVE
Spec Grav, UA: 1.015 (ref 1.010–1.025)
Urobilinogen, UA: 0.2 U/dL
pH, UA: 5.5 (ref 5.0–8.0)

## 2022-11-16 MED ORDER — ASPIRIN 81 MG PO CHEW: 81.0000 mg | CHEWABLE_TABLET | Freq: Every day | ORAL | 3 refills | Status: DC

## 2022-11-16 NOTE — Progress Notes (Signed)
   PRENATAL VISIT NOTE  Subjective:  Brittany [name] is a 32 y.o. [id number] at [redacted]w[redacted]d being seen today for ongoing prenatal care.  She is currently monitored for the following issues for this high-risk pregnancy and has Asthma; History of postpartum hemorrhage; Supervision of high risk pregnancy, antepartum; Dizygotic twin pregnancy; Obesity affecting pregnancy; Grand multipara; and Placenta previa in second trimester on their problem list.  Patient reports no complaints.  Contractions: Not present. Vag. Bleeding: None.  Movement: Present. Denies leaking of fluid.   The following portions of the patient's history were reviewed and updated as appropriate: allergies, current medications, past family history, past medical history, past social history, past surgical history and problem list.   Objective:   Vitals:   11/16/22 1058  BP: 113/63  Pulse: 91  Weight: 239 lb (108.4 kg)    Fetal Status:     Movement: Present     General:  Alert, oriented and cooperative. Patient is in no acute distress.  Skin: Skin is warm and dry. No rash noted.   Cardiovascular: Normal heart rate noted  Respiratory: Normal respiratory effort, no problems with respiration noted  Abdomen: Soft, gravid, appropriate for gestational age.  Pain/Pressure: Present     Pelvic: Cervical exam deferred        Extremities: Normal range of motion.     Mental Status: Normal mood and affect. Normal behavior. Normal judgment and thought content.   Assessment and Plan:  Pregnancy: [id number] at [redacted]w[redacted]d 1. Obesity affecting pregnancy in second trimester, unspecified obesity type - 2 baby asa daily  2. Supervision of high risk pregnancy, antepartum  - POC Urinalysis Dipstick OB  3. Dizygotic twin pregnancy - Twin B very small with oligo - close follow up with MFM  4. Grand multipara   5. [redacted] weeks gestation of pregnancy   6. Placenta previa in second trimester - pelvic rest recommended  Preterm labor symptoms and  general obstetric precautions including but not limited to vaginal bleeding, contractions, leaking of fluid and fetal movement were reviewed in detail with the patient. Please refer to After Visit Summary for other counseling recommendations.   Return in about 2 weeks (around 11/30/2022).  Future Appointments  Date Time Provider Department Center  11/24/2022  1:30 PM [facility]  11/30/2022  8:55 AM Doreene Burke, CNM [facility] None  12/13/2022 11:00 AM [facility]    Allie Bossier, MD

## 2022-11-20 ENCOUNTER — Observation Stay: Payer: Medicaid Other

## 2022-11-20 ENCOUNTER — Observation Stay
Admission: EM | Admit: 2022-11-20 | Discharge: 2022-11-20 | Disposition: A | Discharge status: Home / Self Care | Payer: Medicaid Other | Attending: Obstetrics and Gynecology | Admitting: Obstetrics and Gynecology

## 2022-11-20 ENCOUNTER — Other Ambulatory Visit: Payer: Self-pay

## 2022-11-20 ENCOUNTER — Encounter: Payer: Self-pay | Admitting: Obstetrics and Gynecology

## 2022-11-20 DIAGNOSIS — O30049 Twin pregnancy, dichorionic/diamniotic, unspecified trimester: Secondary | ICD-10-CM | POA: Diagnosis present

## 2022-11-20 DIAGNOSIS — B9689 Other specified bacterial agents as the cause of diseases classified elsewhere: Secondary | ICD-10-CM | POA: Insufficient documentation

## 2022-11-20 DIAGNOSIS — O4412 Placenta previa with hemorrhage, second trimester: Secondary | ICD-10-CM | POA: Diagnosis not present

## 2022-11-20 DIAGNOSIS — O23592 Infection of other part of genital tract in pregnancy, second trimester: Secondary | ICD-10-CM | POA: Diagnosis not present

## 2022-11-20 DIAGNOSIS — Z3A20 20 weeks gestation of pregnancy: Secondary | ICD-10-CM | POA: Insufficient documentation

## 2022-11-20 DIAGNOSIS — F17210 Nicotine dependence, cigarettes, uncomplicated: Secondary | ICD-10-CM | POA: Insufficient documentation

## 2022-11-20 DIAGNOSIS — O4402 Placenta previa specified as without hemorrhage, second trimester: Secondary | ICD-10-CM | POA: Diagnosis present

## 2022-11-20 DIAGNOSIS — O4692 Antepartum hemorrhage, unspecified, second trimester: Principal | ICD-10-CM | POA: Diagnosis present

## 2022-11-20 DIAGNOSIS — O30042 Twin pregnancy, dichorionic/diamniotic, second trimester: Secondary | ICD-10-CM | POA: Insufficient documentation

## 2022-11-20 LAB — CBC WITH DIFFERENTIAL/PLATELET
Abs Immature Granulocytes: 0.02 10*3/uL (ref 0.00–0.07)
Basophils Absolute: 0 10*3/uL (ref 0.0–0.1)
Basophils Relative: 0 %
Eosinophils Absolute: 0.3 10*3/uL (ref 0.0–0.5)
Eosinophils Relative: 4 %
HCT: 29.2 % — ABNORMAL LOW (ref 36.0–46.0)
Hemoglobin: 10.2 g/dL — ABNORMAL LOW (ref 12.0–15.0)
Immature Granulocytes: 0 %
Lymphocytes Relative: 31 %
Lymphs Abs: 2.5 10*3/uL (ref 0.7–4.0)
MCH: 27.8 pg (ref 26.0–34.0)
MCHC: 34.9 g/dL (ref 30.0–36.0)
MCV: 79.6 fL — ABNORMAL LOW (ref 80.0–100.0)
Monocytes Absolute: 0.7 10*3/uL (ref 0.1–1.0)
Monocytes Relative: 9 %
Neutro Abs: 4.5 10*3/uL (ref 1.7–7.7)
Neutrophils Relative %: 56 %
Platelets: 274 10*3/uL (ref 150–400)
RBC: 3.67 MIL/uL — ABNORMAL LOW (ref 3.87–5.11)
RDW: 12.5 % (ref 11.5–15.5)
WBC: 8 10*3/uL (ref 4.0–10.5)
nRBC: 0 % (ref 0.0–0.2)

## 2022-11-20 LAB — WET PREP, GENITAL
Sperm: NONE SEEN
Trich, Wet Prep: NONE SEEN
WBC, Wet Prep HPF POC: <10 (ref <10)
Yeast Wet Prep HPF POC: NONE SEEN

## 2022-11-20 LAB — TYPE AND SCREEN
ABO/RH(D): O POS
Antibody Screen: NEGATIVE

## 2022-11-20 LAB — APTT: aPTT: 28 s (ref 24–36)

## 2022-11-20 LAB — PROTIME-INR
INR: 1.1 (ref 0.8–1.2)
Prothrombin Time: 14.5 s (ref 11.4–15.2)

## 2022-11-20 LAB — FIBRINOGEN: Fibrinogen: 488 mg/dL — ABNORMAL HIGH (ref 210–475)

## 2022-11-20 MED ORDER — METOCLOPRAMIDE HCL 5 MG PO TABS: 5.0000 mg | ORAL_TABLET | Freq: Three times a day (TID) | ORAL | 3 refills | Status: DC

## 2022-11-20 MED ORDER — METRONIDAZOLE 500 MG PO TABS
500.0000 mg | ORAL_TABLET | Freq: Two times a day (BID) | ORAL | 0 refills | Status: AC
Start: 2022-11-20 — End: 2022-11-27

## 2022-11-20 MED ORDER — ACETAMINOPHEN 325 MG PO TABS: 650.0000 mg | ORAL_TABLET | ORAL | Status: DC | PRN

## 2022-11-20 MED ORDER — ACCRUFER 30 MG PO CAPS: 30.0000 mg | ORAL_CAPSULE | Freq: Two times a day (BID) | ORAL | 4 refills | Status: DC

## 2022-11-20 MED ORDER — METOCLOPRAMIDE HCL 5 MG PO TABS
5.0000 mg | ORAL_TABLET | Freq: Three times a day (TID) | ORAL | Status: DC
  Administered 2022-11-20 (×2): 5 mg via ORAL
  Filled 2022-11-20 (×2): qty 1

## 2022-11-20 MED ORDER — METRONIDAZOLE 500 MG PO TABS
500.0000 mg | ORAL_TABLET | Freq: Two times a day (BID) | ORAL | Status: DC
  Administered 2022-11-20 (×2): 500 mg via ORAL
  Filled 2022-11-20 (×2): qty 1

## 2022-11-20 MED ORDER — ACETAMINOPHEN 325 MG PO TABS
650.0000 mg | ORAL_TABLET | ORAL | Status: DC | PRN
  Administered 2022-11-20: 650 mg via ORAL
  Filled 2022-11-20: qty 2

## 2022-11-20 MED ORDER — ALBUTEROL SULFATE HFA 108 (90 BASE) MCG/ACT IN AERS: 2.0000 | INHALATION_SPRAY | Freq: Four times a day (QID) | RESPIRATORY_TRACT | 2 refills | Status: DC | PRN

## 2022-11-20 NOTE — Progress Notes (Signed)
[facility] ANTEPARTUM COMPREHENSIVE PROGRESS NOTE  Brittany [name] is a 32 y.o. [id number] at [redacted]w[redacted]d who is admitted for vaginal bleeding.  Estimated Date of Delivery: 04/05/23 Fetal presentation is  variable .  Length of Stay:  0 Days. Admitted 11/20/2022  Subjective: Reports she has not noted blood with wiping with last void. Endorses fetal movement as usual, denies loss of fluid or contractions. Denies abdominal pain or contractions. Reports pelvic/hip pain that is usual for her. Reports nausea after meals, has used reglan in past pregnancy with success, declines zofran as this has not worked for her in the past.  Vitals:  Blood pressure 128/81, pulse 83, temperature 98 F (36.7 C), temperature source Oral, resp. rate 17, last menstrual period 06/21/2022, not currently breastfeeding. Physical Examination: CONSTITUTIONAL: Well-developed, well-nourished female in no acute distress.  SKIN: Skin is warm and dry. No rash noted. Not diaphoretic. No erythema. No pallor. NEUROLGIC: Alert and oriented to person, place, and time.  PSYCHIATRIC: Normal mood and affect. Normal behavior. Normal judgment and thought content. CARDIOVASCULAR: Normal heart rate noted RESPIRATORY: Effort normal, no problems with respiration noted  ABDOMEN: Soft, nontender, nondistended, gravid. CERVIX:  SVE deferred due to complete previa. SSE performed, small amount bright red discharge in vault, cervix visually closed, wet prep collected.   Results for orders placed or performed during the hospital encounter of 11/20/22 (from the past 48 hour(s))  CBC with Differential/Platelet     Status: Abnormal   Collection Time: 11/20/22 10:07 AM  Result Value Ref Range   WBC 8.0 4.0 - 10.5 K/uL   RBC 3.67 (L) 3.87 - 5.11 MIL/uL   Hemoglobin 10.2 (L) 12.0 - 15.0 g/dL   HCT 16.1 (L) 09.6 - 04.5 %   MCV 79.6 (L) 80.0 - 100.0 fL   MCH 27.8 26.0 - 34.0 pg   MCHC 34.9 30.0 - 36.0 g/dL   RDW 40.9 81.1 - 91.4 %   Platelets  274 150 - 400 K/uL   nRBC 0.0 0.0 - 0.2 %   Neutrophils Relative % 56 %   Neutro Abs 4.5 1.7 - 7.7 K/uL   Lymphocytes Relative 31 %   Lymphs Abs 2.5 0.7 - 4.0 K/uL   Monocytes Relative 9 %   Monocytes Absolute 0.7 0.1 - 1.0 K/uL   Eosinophils Relative 4 %   Eosinophils Absolute 0.3 0.0 - 0.5 K/uL   Basophils Relative 0 %   Basophils Absolute 0.0 0.0 - 0.1 K/uL   Immature Granulocytes 0 %   Abs Immature Granulocytes 0.02 0.00 - 0.07 K/uL    Comment: Performed at [facility], [address] [place], [place] [address]  Type and screen [facility]     Status: None (Preliminary result)   Collection Time: 11/20/22 10:07 AM  Result Value Ref Range   ABO/RH(D) PENDING    Antibody Screen PENDING    Sample Expiration      11/23/2022,2359 Performed at [facility], [address], [place], [place] [address]     [employer]  Result Date: 11/20/2022 CLINICAL DATA:  Twin pregnancy. Vaginal bleeding in 2nd trimester. Placenta previa. EXAM: LIMITED OBSTETRIC ULTRASOUND FINDINGS: Number of Fetuses:  2 Separating Membrane: Visualized TWIN 1 Heart Rate:  150 bpm Movement: Yes Presentation: Variable Placental Location: Posterior Previa: Complete Amniotic Fluid (Subjective):  Within normal limits. BPD:  4.6cm 20w 5d TWIN 2 Heart Rate:  166 bpm Movement: Yes Presentation: Variable Placental Location: Small and not well visualized Amniotic Fluid (  Subjective): Decreased BPD:  3.7cm 18w 1d MATERNAL FINDINGS: Cervix: Appears closed, although limited visualization on this transabdominal exam. Cervical length could not be measured. Uterus/Adnexae: No abnormality visualized. IMPRESSION: Living twin IUP.  Fetus 2 shows decreased amniotic fluid volume. Complete placenta previa.  No definite abruption identified. Cervix is suboptimally visualized on this transabdominal exam. Recommend transvaginal ultrasound if improved visualization and cervical length measurement is needed.  This exam is performed on an emergent basis and does not comprehensively evaluate fetal size, dating, or anatomy; follow-up complete OB US should be considered if further fetal assessment is warranted Electronically Signed   By: Danae Orleans M.D.   On: 11/20/2022 10:07    Current scheduled medications   I have reviewed the patient's current medications.  ASSESSMENT: Principal Problem:   Vaginal bleeding in pregnancy, second trimester Active Problems:   Dizygotic twin pregnancy   Placenta previa in second trimester   PLAN: Ultrasound complete, no evidence of abruption, complete previa again noted Labs ordered, once resulted will discuss with MFM & Dr. Logan Bores to plan for discharge or continued monitoring.   Continue routine antenatal care.   Dominica Severin, CNM [facility] at [facility]

## 2022-11-20 NOTE — Progress Notes (Signed)
Reviewed lab results, ultrasound results & results of SSE with Drs. Booker & Evans. Per Brittany [name] reasonable to remain inpatient for 24h after last episode of bleeding. Brittany [name] prefers discharge home later today/early evening due to childcare challenges, discussed with Dr. Logan Bores who agrees to discharge today unless repeated episode of bleeding. Work excuse note provided. Start flagyl for BV, accrufer for anemia as Brittany [name] has not tolerated ferrous sulfate in past pregnancies because of GI upset, start reglan for nausea, she also requests albuterol inhaler refill. Prescriptions sent to pharmacy of choice on file.  Plan discharge home this evening in absence of repeated bleeding. Next MFM ultrasound 10/9.  Brittany [name], CNM

## 2022-11-20 NOTE — H&P (Signed)
OB History & Physical   History of Present Illness:  Chief Complaint: vaginal bleeding  HPI:  Brittany [name] is a 32 y.o. [id number] female at [redacted]w[redacted]d dated by 6 week ultrasound.  Her pregnancy has been complicated by Dichorionic diamniotic twin pregnancy, placenta previa in second trimester, growth restriction and oligohydramnios twin B, obesity, asthma, smoker, grand multiparity.    She denies contractions.   She denies leakage of fluid.   She reports vaginal bleeding.  She reports fetal movement although she is uncertain if she is feeling both twins.    She was out shopping last night and experienced bright red blood going down her leg. She put on a pad and came in to the hospital. She reported bleeding was not active at that time- only on the pad. She quantified the amount as about 2 tablespoons. She has pain bilateral in lower pelvis and in her hips that is constant and sharp.  Overnight update: She has red bleeding only with wiping overnight.  Total weight gain for pregnancy: 5.897 kg   Obstetrical Problem List: nine Problems (from 07/29/22 to present)     Problem Noted Resolved   Vaginal bleeding in pregnancy, second trimester 11/20/2022 by Tresea Mall, CNM No   Tobacco smoker, 1 pack of cigarettes or less per day 11/20/2022 by Tresea Mall, CNM No   Supervision of high risk pregnancy, antepartum 07/29/2022 by [name], CMA No   Overview Addendum 10/07/2022  5:50 PM by Tresea Mall, CNM     Clinical Staff Provider  Office Location  [facility] Dating  By 6 week u/s  Language  English Anatomy US    Flu Vaccine  offer Genetic Screen  NIPS:   TDaP vaccine   offer Hgb A1C or  GTT Early : Hgb A1C 5.4 Third trimester :   Covid declined   LAB RESULTS   Rhogam  O/Positive/-- (08/14 1103)  Blood Type O/Positive/-- (08/14 1103)   Feeding Plan breast Antibody Negative (08/14 1103)  Contraception tubal Rubella 1.01 (08/14 1103)  Circumcision yes RPR Non Reactive (08/14  1103)   Pediatrician  [facility] HBsAg Negative (08/14 1103)   Support Person Cody HIV Non Reactive (08/14 1103)  Prenatal Classes  Varicella     GBS  (For PCN allergy, check sensitivities)   BTL Consent  Hep C     VBAC Consent  Pap Diagnosis  Date Value Ref Range Status  09/29/2022   Final   - Negative for intraepithelial lesion or malignancy (NILM)      Hgb Electro      CF      SMA         Comments  Senia D [name] was seen due to a spontaneously  conceived twin pregnancy at 19 weeks and 1 day.  She had a  first trimester ultrasound performed in your office during  which time her [facility] was changed to April 05, 2023 and a  probable dichorionic, diamniotic twin gestation was noted.  The patient is a grand multipara with 6 prior uncomplicated  full-term vaginal deliveries.  She denies any significant past medical history and denies  any problems in her current pregnancy.  The patient had a cell free DNA test earlier in her pregnancy  which indicated a low risk for trisomy 19, 18, and 13.  These  are predicted to be dizygotic/fraternal twins.  Two female  fetuses are predicted.  Twin A: Appropriate fetal growth and normal amniotic fluid.  No obvious fetal  anomalies noted.  Normal-appearing  posterior placenta with complete placenta previa.  Twin B: Small for gestational age (EFW 3rd percentile),  oligohydramnios, probable small anterior placenta.  Twin B  appears to be visually smaller than twin A.  Doppler studies of  the umbilical arteries for twin B showed intermittent absent  end-diastolic flow.  The views of the fetal anatomy for twin B  were limited due to oligohydramnios and the small fetal size.  The following were discussed during today's consultation:  Early onset IUGR with oligohydramnios in twin B  As her cell free DNA test indicates that this is a dizygotic twin  gestation and as a thick dividing membrane was noted during  her first trimester ultrasound,  today's findings are not  consistent with the twin to twin transfusion syndrome which  usually occurs in monozygotic/monochorionic twins.  As a normal-appearing filled fetal bladder was noted in twin B  and at least one fetal kidney was visualized today, the  oligohydramnios noted today is unlikely related to renal  agenesis.  She was advised that today's findings in twin B are most  likely related to placental dysfunction/placental insufficiency.  The probable anterior placenta in twin B appeared extremely  small.  She was advised that the most likely outcome for twin B is a  fetal demise at some point in her pregnancy.  She was reassured that as this is most likely a dichorionic,  diamniotic twin gestation, the demise of one twin will probably  not have any effects on the remaining live fetus.  Should a demise of twin B be noted during her future  ultrasound exams, we will treat her pregnancy as a singleton  from that time on.  The increased risk of preeclampsia due to placental  dysfunction and a twin gestation was discussed.  She was  advised to start taking 2 tablets of baby aspirin (81 mg each)  for preeclampsia prophylaxis.  We will continue to follow her with frequent ultrasound exams  for fetal assessment.  Complete posterior placenta previa  The patient has 6 prior vaginal deliveries.  She denies any  prior uterine surgeries or cesarean deliveries.  She was reassured that most cases of placenta previa noted  early in pregnancy will resolve later in her pregnancy.  We will  continue to assess the placental location during her future  exams.  A follow-up exam was scheduled in our office in 2 weeks for  fetal assessment.    The patient and her partner stated that all of their questions  were answered today.  They stated that they understood  everything that was discussed with them.    A total of 45 minutes was spent counseling and coordinating  the care for this  patient.  Greater than 50% of the time was  spent in direct face-to-face contact. ----------------------------------------------------------------------                   Ma Rings, MD Electronically Signed Final Report   11/15/2022 04:43 pm ----------------------------------------------------------------------           Maternal Medical History:   Past Medical History:  Diagnosis Date   Asthma    Chlamydia    Gonorrhea    Hx MRSA infection 02/16/2003   Trichimoniasis     Past Surgical History:  Procedure Laterality Date   MULTIPLE TOOTH EXTRACTIONS      Allergies  Allergen Reactions   Iodine Other (See Comments)    blistering   Sulfonamide Derivatives  Other (See Comments)    blistering    Prior to Admission medications   Medication Sig Start Date End Date Taking? Authorizing Provider  aspirin 81 MG chewable tablet Chew 1 tablet (81 mg total) by mouth daily. 11/16/22  Yes Dove, Myra C, MD  albuterol (PROVENTIL) (2.5 MG/3ML) 0.083% nebulizer solution Take 2.5 mg by nebulization every 6 (six) hours as needed. 10/23/14   [provider]  fluticasone (FLOVENT HFA) 110 MCG/ACT inhaler INHALE 1 PUFF INTO THE LUNGS TWICE A DAY 09/17/22   Doreene Burke, CNM  Prenatal Multivit-Min-Fe-FA (PRE-NATAL PO) Take by mouth.    [provider]  sertraline (ZOLOFT) 50 MG tablet TAKE 1 TABLET BY MOUTH EVERY DAY 04/30/21   Doreene Burke, CNM    OB History  Gravida Para Term Preterm AB Living  9 6 6   2 6   SAB IAB Ectopic Multiple Live Births  2     0 6    # Outcome Date GA Lbr Len/2nd Weight Sex Type Anes PTL Lv  9 Current           8 Term 02/12/21 [redacted]w[redacted]d 10:58 / 00:02 2710 g M Vag-Spont None  LIV  7 SAB 12/17/19          6 Term 05/18/19 [redacted]w[redacted]d / 00:14 3230 g F Vag-Spont EPI  LIV  5 SAB 2020          4 Term 09/03/13 [redacted]w[redacted]d 02:13 / 00:22 3141 g F Vag-Spont EPI  LIV  3 Term 12/07/11 [redacted]w[redacted]d / 00:04 3104 g F Vag-Spont None  LIV  2 Term 08/30/10 [redacted]w[redacted]d 03:25 / 12:30 3090  g M Vag-Spont None  LIV     Birth Comments: diabetic- oral meds  1 Term 06/16/07    Judie Petit Vag-Spont   LIV    Prenatal care site: [facility]  Social History: She  reports that she has been smoking cigarettes. She started smoking about 14 years ago. She has a 1.3 pack-year smoking history. She has never used smokeless tobacco. She reports that she does not currently use drugs after having used the following drugs: Marijuana and Other-see comments. She reports that she does not drink alcohol.  Family History: family history includes Asthma in her father; COPD in her father; Cancer in her father, maternal grandfather, and maternal grandmother; Diabetes in her maternal grandmother; Healthy in her brother, brother, brother, and mother; Heart disease in her father; Hypertension in her father and maternal grandmother; Lung disease in her father.    Review of Systems:  Review of Systems  Constitutional:  Negative for chills and fever.  HENT:  Negative for congestion, ear discharge, ear pain, hearing loss, sinus pain and sore throat.   Eyes:  Negative for blurred vision and double vision.  Respiratory:  Negative for cough, shortness of breath and wheezing.   Cardiovascular:  Negative for chest pain, palpitations and leg swelling.  Gastrointestinal:  Negative for abdominal pain, blood in stool, constipation, diarrhea, heartburn, melena, nausea and vomiting.  Genitourinary:  Negative for dysuria, flank pain, frequency, hematuria and urgency.       Positive for vaginal bleeding  Musculoskeletal:  Negative for back pain, joint pain and myalgias.       Positive for hip pain and bilateral lower pelvic pain  Skin:  Negative for itching and rash.  Neurological:  Negative for dizziness, tingling, tremors, sensory change, speech change, focal weakness, seizures, loss of consciousness, weakness and headaches.  Endo/Heme/Allergies:  Negative for environmental allergies. Does  not bruise/bleed easily.   Psychiatric/Behavioral:  Negative for depression, hallucinations, memory loss, substance abuse and suicidal ideas. The patient is not nervous/anxious and does not have insomnia.      Physical Exam:  BP 128/81 (BP Location: Left Arm)   Pulse 83   Temp 98 F (36.7 C) (Oral)   Resp 17   LMP 06/21/2022 (Approximate)   Constitutional: Well nourished, well developed female in no acute distress.  HEENT: normal Skin: Warm and dry.  Cardiovascular: Regular rate and rhythm.   Extremity:  no edema   Respiratory: Clear to auscultation bilateral. Normal respiratory effort Abdomen: FHT present x2 Psych: Alert and Oriented x3. No memory deficits. Normal mood and affect.    Pelvic exam: (female chaperone present) Golf ball size of red blood on panty liner on admission. No blood on bed pad. Vulva dry- no evidence of blood on visual exam.    Baseline FHR:  Twin A: 150 bpm, moderate variability, -accelerations, -decelerations Twin B: 145 bpm, moderate variability, -accelerations, -decelerations Contractions: absent  Overall assessment: reassuring   Lab Results  Component Value Date   SARSCOV2NAA NEGATIVE 02/13/2021    Assessment:  Brittany [name] is a 32 y.o. [id number] female at [redacted]w[redacted]d with vaginal bleeding.   Plan:  Admit to Labor & Delivery for observation Consult with MFM Dr Grace Bushy: keep for observation 24 hours from last bleeding, consider limited ultrasound to rule out abruption, consider Procardia if she has contractions Regular diet, encourage PO hydration Fetal well-being: monitor q 12 hours Ultrasound ordered   Tresea Mall, CNM 11/20/2022 7:51 AM

## 2022-11-20 NOTE — OB Triage Note (Signed)
Discharge instructions given as per AVS. Patient verbalized understanding. Patient discharged in stable condition via wheelchair.

## 2022-11-20 NOTE — OB Triage Note (Signed)
Brittany [name] 32 y.o. G9P6 @20w4dGA   presents to Labor & Delivery triage via wheelchair steered by ED staff reporting vaginal bleeding. She states that she was walking in [employer] and noticed bright red blood running down her leg. She does not feel like she is having contractions, but does complain of lower pelvic and vaginal pain. She denies signs and symptoms consistent with rupture of membranes. She denies contractions. External FM and TOCO applied to non-tender abdomen. Initial FHR 145 baby B and 150 baby A. Vital signs obtained and within normal limits. Patient oriented to care environment including call bell and bed control use. Tresea Mall, CNM notified of patient's arrival. Plan to observe.

## 2022-11-20 NOTE — Discharge Summary (Signed)
Patient ID: DAYANNI LARGO MRN: [id number] DOB/AGE: 32-Sep-1992 32 y.o.  Admit date: 11/20/2022 Discharge date: 11/20/2022  Admission Diagnoses: Vaginal bleeding in second trimester; di/di twin pregnancy; Complete previa  Discharge Diagnoses: BV in second trimester of pregnancy, resolved vaginal bleeding, complete previa, di/di twin pregnancy  Prenatal Care Site:  [facility]  Prenatal Procedures: ultrasound  Consults: Maternal Fetal Medicine  Significant Diagnostic Studies:  Results for orders placed or performed during the hospital encounter of 11/20/22 (from the past 168 hour(s))  CBC with Differential/Platelet   Collection Time: 11/20/22 10:07 AM  Result Value Ref Range   WBC 8.0 4.0 - 10.5 K/uL   RBC 3.67 (L) 3.87 - 5.11 MIL/uL   Hemoglobin 10.2 (L) 12.0 - 15.0 g/dL   HCT 32.9 (L) 51.8 - 84.1 %   MCV 79.6 (L) 80.0 - 100.0 fL   MCH 27.8 26.0 - 34.0 pg   MCHC 34.9 30.0 - 36.0 g/dL   RDW 66.0 63.0 - 16.0 %   Platelets 274 150 - 400 K/uL   nRBC 0.0 0.0 - 0.2 %   Neutrophils Relative % 56 %   Neutro Abs 4.5 1.7 - 7.7 K/uL   Lymphocytes Relative 31 %   Lymphs Abs 2.5 0.7 - 4.0 K/uL   Monocytes Relative 9 %   Monocytes Absolute 0.7 0.1 - 1.0 K/uL   Eosinophils Relative 4 %   Eosinophils Absolute 0.3 0.0 - 0.5 K/uL   Basophils Relative 0 %   Basophils Absolute 0.0 0.0 - 0.1 K/uL   Immature Granulocytes 0 %   Abs Immature Granulocytes 0.02 0.00 - 0.07 K/uL  Fibrinogen   Collection Time: 11/20/22 10:07 AM  Result Value Ref Range   Fibrinogen 488 (H) 210 - 475 mg/dL  Protime-INR   Collection Time: 11/20/22 10:07 AM  Result Value Ref Range   Prothrombin Time 14.5 11.4 - 15.2 seconds   INR 1.1 0.8 - 1.2  APTT   Collection Time: 11/20/22 10:07 AM  Result Value Ref Range   aPTT 28 24 - 36 seconds  Type and screen [facility]   Collection Time: 11/20/22 10:07 AM  Result Value Ref Range   ABO/RH(D) O POS    Antibody Screen NEG    Sample  Expiration      11/23/2022,2359 Performed at [facility], [address] [place], [place] [address]   Wet prep, genital   Collection Time: 11/20/22 10:30 AM  Result Value Ref Range   Yeast Wet Prep HPF POC NONE SEEN NONE SEEN   Trich, Wet Prep NONE SEEN NONE SEEN   Clue Cells Wet Prep HPF POC PRESENT (A) NONE SEEN   WBC, Wet Prep HPF POC <10 <10   Sperm NONE SEEN   Results for orders placed or performed in visit on 11/16/22 (from the past 168 hour(s))  POC Urinalysis Dipstick OB   Collection Time: 11/16/22 11:30 AM  Result Value Ref Range   Color, UA     Clarity, UA     Glucose, UA Negative Negative   Bilirubin, UA Negative    Ketones, UA Negative    Spec Grav, UA 1.015 1.010 - 1.025   Blood, UA Negative    pH, UA 5.5 5.0 - 8.0   POC,PROTEIN,UA Negative Negative, Trace, Small (1+), Moderate (2+), Large (3+), 4+   Urobilinogen, UA 0.2 0.2 or 1.0 E.U./dL   Nitrite, UA     Leukocytes, UA Negative Negative   Appearance     Odor  Treatments: none  Hospital Course:  This is a 32 y.o. [id number] with IUP at [redacted]w[redacted]d admitted for episode of painless vaginal bleeding. Cervix visually closed and noted to be closed on ultrasound. Bleeding did not continue after admission, tapering from bright red bleeding running down her leg to noting only with wiping to patient report of none this afternoon. Coagulation studies were normal with a stable hgb and fibrinogen of 488. Ultrasound demonstrated viable di/di twin pregnancy without evidence of abruption and continued complete previa. Denied contractions or cramping, no loss of fluid and no further bleeding. Bacterial vaginosis diagnosed on wet prep and flagyl was started with plan to complete course outpatient. Refill provided for albuterol inhaler. Accrufer ordered to start outpatient given history of anemia in pregnancy and hgb on admit 10.2, has not tolerated ferrous sulfate in past due to GI side effects. Will start reglan for  persistent nausea, diclegis & zofran have failed to control her nausea in prior pregnancies.  She was deemed stable for discharge to home with outpatient follow up.  Discharge Physical Exam:  BP 133/88 (BP Location: Left Arm)   Pulse 82   Temp 98.1 F (36.7 C) (Axillary)   Resp 18   LMP 06/21/2022 (Approximate)   General: NAD CV: regular rate Pulm: nl effort ABD: s/nd/nt, gravid DVT Evaluation: LE non-ttp, no evidence of DVT on exam.  SVE:  deferred SSE: cervix visually closed, no active bleeding, small amount bloody mucous in vault Fetal monitoring:  FHR present x2, ultrasound FHR A: 150, FHR B 166     Discharge Condition: Stable  Disposition: Discharge disposition: 01-Home or Self Care       Discharge Instructions     Discharge activity:   Complete by: As directed    Avoid lifting greater than 10lbs   Discharge diet:  No restrictions   Complete by: As directed    Do not have sex or do anything that might make you have an orgasm   Complete by: As directed       Allergies as of 11/20/2022       Reactions   Iodine Other (See Comments)   blistering   Sulfonamide Derivatives Other (See Comments)   blistering        Medication List     STOP taking these medications    albuterol (2.5 MG/3ML) 0.083% nebulizer solution Commonly known as: PROVENTIL Replaced by: albuterol 108 (90 Base) MCG/ACT inhaler       TAKE these medications    ACCRUFeR 30 MG Caps Generic drug: Ferric Maltol Take 1 capsule (30 mg total) by mouth in the morning and at bedtime.   acetaminophen 325 MG tablet Commonly known as: TYLENOL Take 2 tablets (650 mg total) by mouth every 4 (four) hours as needed for moderate pain (for pain scale < 4  OR  temperature  >/=  100.5 F).   albuterol 108 (90 Base) MCG/ACT inhaler Commonly known as: VENTOLIN HFA Inhale 2 puffs into the lungs every 6 (six) hours as needed for wheezing or shortness of breath. Replaces: albuterol (2.5 MG/3ML)  0.083% nebulizer solution   aspirin 81 MG chewable tablet Chew 1 tablet (81 mg total) by mouth daily.   fluticasone 110 MCG/ACT inhaler Commonly known as: FLOVENT HFA INHALE 1 PUFF INTO THE LUNGS TWICE A DAY   metoCLOPramide 5 MG tablet Commonly known as: Reglan Take 1 tablet (5 mg total) by mouth 3 (three) times daily.   metroNIDAZOLE 500 MG tablet Commonly known as: FLAGYL Take  1 tablet (500 mg total) by mouth 2 (two) times daily for 7 days.   PRE-NATAL PO Take by mouth.   sertraline 50 MG tablet Commonly known as: ZOLOFT TAKE 1 TABLET BY MOUTH EVERY DAY       -MFM ultrasound 11/24/22 -Next OB clinic visit 11/30/22 -Pelvic rest and no lifting greater than 10lb -Reinforced call signs & when to present for care   Signed:  Dominica Severin 11/20/2022 3:46 PM -----

## 2022-11-23 ENCOUNTER — Encounter: Payer: Self-pay | Admitting: Obstetrics and Gynecology

## 2022-11-23 ENCOUNTER — Other Ambulatory Visit: Payer: Self-pay

## 2022-11-23 ENCOUNTER — Observation Stay: Payer: Medicaid Other

## 2022-11-23 ENCOUNTER — Observation Stay
Admission: EM | Admit: 2022-11-23 | Discharge: 2022-11-24 | Disposition: A | Discharge status: Home / Self Care | Payer: Medicaid Other | Attending: Obstetrics and Gynecology | Admitting: Obstetrics and Gynecology

## 2022-11-23 DIAGNOSIS — O30042 Twin pregnancy, dichorionic/diamniotic, second trimester: Secondary | ICD-10-CM | POA: Diagnosis not present

## 2022-11-23 DIAGNOSIS — J45909 Unspecified asthma, uncomplicated: Secondary | ICD-10-CM | POA: Diagnosis not present

## 2022-11-23 DIAGNOSIS — O4690 Antepartum hemorrhage, unspecified, unspecified trimester: Secondary | ICD-10-CM | POA: Diagnosis present

## 2022-11-23 DIAGNOSIS — E669 Obesity, unspecified: Secondary | ICD-10-CM | POA: Insufficient documentation

## 2022-11-23 DIAGNOSIS — O99212 Obesity complicating pregnancy, second trimester: Secondary | ICD-10-CM | POA: Diagnosis not present

## 2022-11-23 DIAGNOSIS — O99332 Smoking (tobacco) complicating pregnancy, second trimester: Secondary | ICD-10-CM | POA: Insufficient documentation

## 2022-11-23 DIAGNOSIS — O99512 Diseases of the respiratory system complicating pregnancy, second trimester: Secondary | ICD-10-CM | POA: Insufficient documentation

## 2022-11-23 DIAGNOSIS — Z7982 Long term (current) use of aspirin: Secondary | ICD-10-CM | POA: Insufficient documentation

## 2022-11-23 DIAGNOSIS — O4692 Antepartum hemorrhage, unspecified, second trimester: Secondary | ICD-10-CM | POA: Diagnosis present

## 2022-11-23 DIAGNOSIS — O0942 Supervision of pregnancy with grand multiparity, second trimester: Secondary | ICD-10-CM | POA: Diagnosis not present

## 2022-11-23 DIAGNOSIS — Z3A21 21 weeks gestation of pregnancy: Secondary | ICD-10-CM | POA: Insufficient documentation

## 2022-11-23 DIAGNOSIS — F17210 Nicotine dependence, cigarettes, uncomplicated: Secondary | ICD-10-CM | POA: Diagnosis not present

## 2022-11-23 DIAGNOSIS — O0990 Supervision of high risk pregnancy, unspecified, unspecified trimester: Principal | ICD-10-CM

## 2022-11-23 LAB — CBC
HCT: 28.3 % — ABNORMAL LOW (ref 36.0–46.0)
Hemoglobin: 10.1 g/dL — ABNORMAL LOW (ref 12.0–15.0)
MCH: 28.5 pg (ref 26.0–34.0)
MCHC: 35.7 g/dL (ref 30.0–36.0)
MCV: 79.7 fL — ABNORMAL LOW (ref 80.0–100.0)
Platelets: 258 10*3/uL (ref 150–400)
RBC: 3.55 MIL/uL — ABNORMAL LOW (ref 3.87–5.11)
RDW: 12.6 % (ref 11.5–15.5)
WBC: 11.9 10*3/uL — ABNORMAL HIGH (ref 4.0–10.5)
nRBC: 0 % (ref 0.0–0.2)

## 2022-11-23 MED ORDER — CALCIUM CARBONATE ANTACID 500 MG PO CHEW: 2.0000 | CHEWABLE_TABLET | ORAL | Status: DC | PRN

## 2022-11-23 MED ORDER — SODIUM CHLORIDE 0.9% IV SOLUTION: Freq: Once | INTRAVENOUS | Status: DC

## 2022-11-23 MED ORDER — LACTATED RINGERS IV SOLN
125.0000 mL/h | INTRAVENOUS | Status: DC
  Administered 2022-11-24: 125 mL/h via INTRAVENOUS

## 2022-11-23 MED ORDER — ACETAMINOPHEN 325 MG PO TABS: 650.0000 mg | ORAL_TABLET | ORAL | Status: DC | PRN

## 2022-11-23 NOTE — OB Triage Note (Signed)
Brittany [name] 32 y.o. [id number] at [redacted]w[redacted]d presents to Labor & Delivery triage via wheelchair steered by ED staff reporting passing 2 orange sized blood clots at home appx 1hr prior to arrival. Her vaginal bleeding began at home at 2000 but has stopped besides 1 blood clot since arriving to triage. She has had 5/10 right sided lower abdominal pain. She denies leaking of fluid and contractions. Patient reports no recent sex. External FM and TOCO applied to non-tender abdomen. Vital signs obtained. Tonita Cong, CNM notified of patient's arrival.

## 2022-11-23 NOTE — Progress Notes (Signed)
Provider placed order for patient to have urinary catheter placed. Patient refused placement of catheter. Educated patient on purpose of placement, patient voiced understanding and voiced no questions. Urinary hat placed in patient's toilet to measure output, patient asked to report when she voids and if she passes anymore clots.

## 2022-11-23 NOTE — H&P (Signed)
History and Physical   HPI  Brittany [name] is a 32 y.o. [id number] at [redacted]w[redacted]d Estimated Date of Delivery: 04/05/23 by 6 week u/s, who is being admitted for observation secondary to vaginal bleeding. She was asleep at home this evening and woke about about 1915 to blood in her bed. Showed pictures of bed, had two plum size clots with larger surrounding areas of blood. Also had one area with slight redness but appeared more more similar to clear fluid mixed with small amount of blood. Also passed one additional plum sized clot since coming to OB/triage. Reports good fetal movement. Denies painful contractions.  Kalman Drape Behe started prenatal care at 5 weeks. Had consistent and routine care complicated by di/di twin gestation, complete previa, recurrent episodes of vaginal bleeding, grand multip, THC use, baby B with IUGR and oligo, BMI 39 at NOB.  Was seen three days ago for vaginal bleeding. At that time reported about two tablespoons of blood on her pad and then having bleeding only with wiping. Was observed and sent home after bleeding resolved. Bleeding tonight has been heavier.   OB History  OB History  Gravida Para Term Preterm AB Living  9 6 6  0 2 6  SAB IAB Ectopic Multiple Live Births  2 0 0 0 6    # Outcome Date GA Lbr Len/2nd Weight Sex Type Anes PTL Lv  9 Current           8 Term 02/12/21 [redacted]w[redacted]d 10:58 / 00:02 2710 g M Vag-Spont None  LIV     Name: Brittany [name]     Apgar1: 8  Apgar5: 9  7 SAB 12/17/19          6 Term 05/18/19 [redacted]w[redacted]d / 00:14 3230 g F Vag-Spont EPI  LIV     Name: Brittany [name]     Apgar1: 9  Apgar5: 9  5 SAB 2020          4 Term 09/03/13 [redacted]w[redacted]d 02:13 / 00:22 3141 g F Vag-Spont EPI  LIV     Name: Brittany [name]     Apgar1: 9  Apgar5: 9  3 Term 12/07/11 [redacted]w[redacted]d / 00:04 3104 g F Vag-Spont None  LIV     Name: Brittany [name]     Apgar1: 8  Apgar5: 9  2 Term 08/30/10 [redacted]w[redacted]d 03:25 / 12:30 3090 g M Vag-Spont None  LIV      Birth Comments: diabetic- oral meds     Name: Brittany [name]     Apgar1: 9  Apgar5: 9  1 Term 06/16/07    M Vag-Spont   LIV    PROBLEM LIST  Pregnancy complications or risks: Patient Active Problem List   Diagnosis Date Noted   Vaginal bleeding during pregnancy, antepartum 11/23/2022   Vaginal bleeding in pregnancy, second trimester 11/20/2022   Tobacco smoker, 1 pack of cigarettes or less per day 11/20/2022   Grand multipara 11/16/2022   Placenta previa in second trimester 11/16/2022   Dizygotic twin pregnancy 09/29/2022   Obesity affecting pregnancy 09/29/2022   Supervision of high risk pregnancy, antepartum 07/29/2022   History of postpartum hemorrhage 05/18/2019   Asthma 02/25/2018    Prenatal labs and studies: ABO, Rh: --/--/O POS (10/05 1007) Antibody: NEG (10/05 1007) Rubella: 1.01 (08/14 1103) RPR: Non Reactive (08/14 1103)  HBsAg: Negative (08/14 1103)  HIV: Non Reactive (08/14 1103)  H&H:  Hemoglobin  Date Value Ref Range Status  11/20/2022 10.2 (L) 12.0 - 15.0 g/dL Final  09/29/2022 11.5 11.1 - 15.9 g/dL Final  82/95/6213 8.8 (L) 12.0 - 15.0 g/dL Final  08/65/7846 96.2 (L) 12.0 - 15.0 g/dL Final  95/28/4132 44.0 (L) 11.1 - 15.9 g/dL Final  12/12/2534 64.4 (L) 11.1 - 15.9 g/dL Final  03/47/4259 56.3 12.0 - 15.0 g/dL Final  87/56/4332 95.1 (L) 11.1 - 15.9 g/dL Final   & hematocrit  GC/CT: neg/neg   Past Medical History:  Diagnosis Date   Asthma    Chlamydia    Gonorrhea    Hx MRSA infection 02/16/2003   Trichimoniasis      Past Surgical History:  Procedure Laterality Date   MULTIPLE TOOTH EXTRACTIONS       Medications    Current Discharge Medication List     CONTINUE these medications which have NOT CHANGED   Details  acetaminophen (TYLENOL) 325 MG tablet Take 2 tablets (650 mg total) by mouth every 4 (four) hours as needed for moderate pain (for pain scale < 4  OR  temperature  >/=  100.5 F).    albuterol (VENTOLIN HFA) 108 (90  Base) MCG/ACT inhaler Inhale 2 puffs into the lungs every 6 (six) hours as needed for wheezing or shortness of breath. Qty: 36 g, Refills: 2    aspirin 81 MG chewable tablet Chew 1 tablet (81 mg total) by mouth daily. Qty: 90 tablet, Refills: 3    fluticasone (FLOVENT HFA) 110 MCG/ACT inhaler INHALE 1 PUFF INTO THE LUNGS TWICE A DAY Qty: 12 each, Refills: 1    metoCLOPramide (REGLAN) 5 MG tablet Take 1 tablet (5 mg total) by mouth 3 (three) times daily. Qty: 90 tablet, Refills: 3    metroNIDAZOLE (FLAGYL) 500 MG tablet Take 1 tablet (500 mg total) by mouth 2 (two) times daily for 7 days. Qty: 13 tablet, Refills: 0   Comments: First dose received in [name] prior to discharge    Prenatal Multivit-Min-Fe-FA (PRE-NATAL PO) Take by mouth.    Ferric Maltol (ACCRUFER) 30 MG CAPS Take 1 capsule (30 mg total) by mouth in the morning and at bedtime. Qty: 60 capsule, Refills: 4   Comments: Unable to tolerate ferrous sulfate due to GI upset.    sertraline (ZOLOFT) 50 MG tablet TAKE 1 TABLET BY MOUTH EVERY DAY Qty: 90 tablet, Refills: 3         Allergies  Iodine and Sulfonamide derivatives  Review of Systems  Pertinent items noted in HPI and remainder of comprehensive ROS otherwise negative.  Physical Exam  BP 131/81   Pulse 93   Temp 98.2 F (36.8 C) (Oral)   Resp 18   LMP 06/21/2022 (Approximate)   SpO2 100%   Lungs:  CTA B Cardio: S1S2, RRR Abd: Soft, gravid, NT SVE: deferred  FHR Toco   Test Results  Results for orders placed or performed during the [name] encounter of 11/23/22 (from the past 24 hour(s))  Prepare RBC (crossmatch)     Status: None (Preliminary result)   Collection Time: 11/23/22 10:00 PM  Result Value Ref Range   Order Confirmation PENDING    {Significant Maternal Lab Values:20259}  Assessment  [id number] at [redacted]w[redacted]d Estimated Date of Delivery: 04/05/23  RNST Latent/active labor GBS pos/neg  Patient Active Problem List   Diagnosis Date  Noted   Vaginal bleeding during pregnancy, antepartum 11/23/2022   Vaginal bleeding in pregnancy, second trimester 11/20/2022   Tobacco smoker, 1 pack of cigarettes or less per day 11/20/2022   Grand multipara 11/16/2022   Placenta previa in second trimester  11/16/2022   Dizygotic twin pregnancy 09/29/2022   Obesity affecting pregnancy 09/29/2022   Supervision of high risk pregnancy, antepartum 07/29/2022   History of postpartum hemorrhage 05/18/2019   Asthma 02/25/2018    Plan  1. Admit to L&D   2. EFM per unit policy 3. Labs : T&S, CBC, RPR 4. MD notified of patient admission and status  5. Expectant management  Brittany [name], CNM, FNP 11/23/2022 10:26 PM

## 2022-11-24 ENCOUNTER — Observation Stay (HOSPITAL_BASED_OUTPATIENT_CLINIC_OR_DEPARTMENT_OTHER): Payer: Medicaid Other

## 2022-11-24 ENCOUNTER — Other Ambulatory Visit: Payer: Self-pay

## 2022-11-24 ENCOUNTER — Observation Stay: Payer: Medicaid Other | Admitting: Obstetrics

## 2022-11-24 DIAGNOSIS — O30042 Twin pregnancy, dichorionic/diamniotic, second trimester: Secondary | ICD-10-CM | POA: Diagnosis not present

## 2022-11-24 DIAGNOSIS — E669 Obesity, unspecified: Secondary | ICD-10-CM

## 2022-11-24 DIAGNOSIS — O99212 Obesity complicating pregnancy, second trimester: Secondary | ICD-10-CM

## 2022-11-24 DIAGNOSIS — O4402 Placenta previa specified as without hemorrhage, second trimester: Secondary | ICD-10-CM

## 2022-11-24 DIAGNOSIS — O0942 Supervision of pregnancy with grand multiparity, second trimester: Secondary | ICD-10-CM

## 2022-11-24 DIAGNOSIS — O365922 Maternal care for other known or suspected poor fetal growth, second trimester, fetus 2: Secondary | ICD-10-CM

## 2022-11-24 DIAGNOSIS — Z3A21 21 weeks gestation of pregnancy: Secondary | ICD-10-CM

## 2022-11-24 DIAGNOSIS — O4102X1 Oligohydramnios, second trimester, fetus 1: Secondary | ICD-10-CM

## 2022-11-24 DIAGNOSIS — O43192 Other malformation of placenta, second trimester: Secondary | ICD-10-CM

## 2022-11-24 DIAGNOSIS — O4692 Antepartum hemorrhage, unspecified, second trimester: Principal | ICD-10-CM

## 2022-11-24 DIAGNOSIS — O4102X2 Oligohydramnios, second trimester, fetus 2: Secondary | ICD-10-CM | POA: Diagnosis not present

## 2022-11-24 DIAGNOSIS — O09292 Supervision of pregnancy with other poor reproductive or obstetric history, second trimester: Secondary | ICD-10-CM

## 2022-11-24 MED ORDER — SODIUM CHLORIDE 0.9% FLUSH: 3.0000 mL | Freq: Two times a day (BID) | INTRAVENOUS | Status: DC

## 2022-11-24 NOTE — Progress Notes (Signed)
Patient back to obs3

## 2022-11-24 NOTE — Progress Notes (Signed)
MFM Note  Brittany [name] is currently at 21 weeks and 1 day.  She was admitted overnight due to the passage of blood clots and vaginal spotting.  Her pregnancy has been complicated by IUGR and anhydramnios in twin B in a dichorionic, diamniotic twin gestation.    This is the second time that she has presented to the hospital in the past week due to vaginal spotting/passage of clots.  The patient reports that she is no longer bleeding and denies any leakage of fluid.  She denies any abdominal pain or contractions.  She had the [employer] cell free DNA test indicating a low risk for trisomy 21, 18, and 13.  Two female fetuses are predicted.  According to the cell free DNA test, this is a dizygotic twin pregnancy, indicating that twin to twin transfusion syndrome is not the cause of anhydramnios in twin B.  On today's ultrasound exam, a complete placenta previa continues to be noted.    The posterior placenta appears to be covering the opening of the internal os.  The patient was advised that the painless vaginal bleeding and blood clots that she has been passing are most likely due to the placenta previa.  There was normal amniotic fluid noted around twin A.  Anhydramnios is noted in twin B.  Both fetuses had a normal heart rate.  Fetal movements of both fetuses were noted today.  Doppler studies of the umbilical arteries showed normal forward flow in twin A.  There were no signs of absent or reversed end-diastolic flow in twin A.    Doppler studies of the umbilical arteries for twin B shows absent end-diastolic flow.  Interrogation of the umbilical arteries closest to the umbilical cord insertion site in twin B shows reversed end-diastolic flow.  The patient was advised that placental dysfunction is the most likely cause of IUGR with anhydramnios in twin B.  Twin B also has a two-vessel umbilical cord.  She was advised that the most likely outcome for twin B is a fetal demise within the next  few weeks.    She was reassured that as this is a dizygotic/dichorionic twin gestation, the demise of one twin will not affect the other.  The patient inquired regarding the possibility of a preterm delivery once twin B reaches viability in order to give twin B a chance for survival.  She was advised that an extremely preterm delivery will subject twin A (the normal, appropriately grown fetus) to the long-term effects and complications associated with a premature delivery.  There is no guarantee that twin B will be able to survive after delivery due to her early gestational age and the small fetal size.    As I do not believe that the patient is experiencing a placental abruption and as her vaginal bleeding is most likely related to the complete placenta previa, the patient may be discharged home as long as she is not actively bleeding.    She will return to our office in 2 weeks for another ultrasound to assess the fetal growth and umbilical artery Doppler studies.  The patient is comfortable with this management plan.    She stated that all of her questions were answered today.  A total of 60 minutes was spent counseling and coordinating the care for this patient.  Greater than 50% of the time was spent in direct face-to-face contact.

## 2022-11-24 NOTE — Progress Notes (Signed)
Patient taken via wheelchair to MFM clinic

## 2022-11-24 NOTE — Progress Notes (Signed)
Notified Cowen,CNM of patient's return from Ultrasound. Additionally, reported that patient was found eating cornstarch by another RN. Vital signs reassessed and within normal range. Patient has scant amount of blood on pad and pad was changed. Tonita Cong, CNM notified of bleeding.

## 2022-11-24 NOTE — Discharge Summary (Signed)
Physician Final Progress Note  Patient ID: Brittany [name] MRN: [id number] DOB/AGE: Jun 21, 1990 32 y.o.  Admit date: 11/23/2022 Admitting provider: Raeford Razor, CNM Discharge date: 11/24/2022   Admission Diagnoses:  1) Di/Di intrauterine pregnancy at [redacted]w[redacted]d  2) vaginal bleeding in pregnancy   Discharge Diagnoses:  Principal Problem:   Vaginal bleeding during pregnancy, antepartum  Complete Placenta previa   History of Present Illness:  [name] is a 32 y.o. [id number] at [redacted]w[redacted]d Estimated Date of Delivery: 04/05/23 by 6 week u/s, who is being admitted for observation secondary to vaginal bleeding. She was asleep at home yesterday evening and woke about about 1915 to blood in her bed. Showed pictures of bed, had two plum size clots with larger surrounding areas of blood. Also had one area with slight redness but appeared more more similar to clear fluid mixed with small amount of blood. Also passed one additional plum sized clot since coming to OB/triage. Reports good fetal movement. Denies painful contractions.   Kalman Drape Vowles started prenatal care at 5 weeks. Had consistent and routine care complicated by di/di twin gestation, complete previa, recurrent episodes of vaginal bleeding, grand multip, THC use, baby B with IUGR and oligo, BMI 39 at NOB.    Past Surgical History:  Procedure Laterality Date   MULTIPLE TOOTH EXTRACTIONS      No current facility-administered medications on file prior to encounter.   Current Outpatient Medications on File Prior to Encounter  Medication Sig Dispense Refill   acetaminophen (TYLENOL) 325 MG tablet Take 2 tablets (650 mg total) by mouth every 4 (four) hours as needed for moderate pain (for pain scale < 4  OR  temperature  >/=  100.5 F). (Patient not taking: Reported on 11/24/2022)     albuterol (VENTOLIN HFA) 108 (90 Base) MCG/ACT inhaler Inhale 2 puffs into the lungs every 6 (six) hours as needed for wheezing or shortness of  breath. 36 g 2   aspirin 81 MG chewable tablet Chew 1 tablet (81 mg total) by mouth daily. 90 tablet 3   fluticasone (FLOVENT HFA) 110 MCG/ACT inhaler INHALE 1 PUFF INTO THE LUNGS TWICE A DAY 12 each 1   metoCLOPramide (REGLAN) 5 MG tablet Take 1 tablet (5 mg total) by mouth 3 (three) times daily. 90 tablet 3   metroNIDAZOLE (FLAGYL) 500 MG tablet Take 1 tablet (500 mg total) by mouth 2 (two) times daily for 7 days. 13 tablet 0   Prenatal Multivit-Min-Fe-FA (PRE-NATAL PO) Take by mouth.     Ferric Maltol (ACCRUFER) 30 MG CAPS Take 1 capsule (30 mg total) by mouth in the morning and at bedtime. 60 capsule 4   sertraline (ZOLOFT) 50 MG tablet TAKE 1 TABLET BY MOUTH EVERY DAY (Patient not taking: Reported on 11/23/2022) 90 tablet 3    Allergies  Allergen Reactions   Iodine Other (See Comments)    blistering   Sulfonamide Derivatives Other (See Comments)    blistering    Social History   Socioeconomic History   Marital status: Single    Spouse name: Not on file   Number of children: 6   Years of education: 24   Highest education level: Not on file  Occupational History   Occupation: [employer]  Tobacco Use   Smoking status: Every Day    Current packs/day: 0.00    Average packs/day: 0.3 packs/day for 5.0 years (1.3 ttl pk-yrs)    Types: Cigarettes    Start date: 12/28/2007    Last attempt  to quit: 12/27/2012    Years since quitting: 9.9   Smokeless tobacco: Never  Vaping Use   Vaping status: Never Used  Substance and Sexual Activity   Alcohol use: No   Drug use: Not Currently    Types: Marijuana    Comment: quit marijuana 2 weeks ago   Sexual activity: Yes    Partners: Male    Birth control/protection: None    Comment: last intercourse 4 days ago  Other Topics Concern   Not on file  Social History Narrative   Not on file   Social Determinants of Health   Financial Resource Strain: Medium Risk (07/29/2022)   Overall Financial Resource Strain (CARDIA)    Difficulty of  Paying Living Expenses: Somewhat hard  Food Insecurity: Food Insecurity Present (07/29/2022)   Hunger Vital Sign    Worried About Running Out of Food in the Last Year: Sometimes true    Ran Out of Food in the Last Year: Often true  Transportation Needs: No Transportation Needs (07/29/2022)   PRAPARE - Administrator, Civil Service (Medical): No    Lack of Transportation (Non-Medical): No  Physical Activity: Sufficiently Active (07/29/2022)   Exercise Vital Sign    Days of Exercise per Week: 2 days    Minutes of Exercise per Session: 120 min  Stress: Stress Concern Present (07/29/2022)   [employer] of Occupational Health - Occupational Stress Questionnaire    Feeling of Stress : To some extent  Social Connections: Moderately Integrated (07/29/2022)   Social Connection and Isolation Panel [NHANES]    Frequency of Communication with Friends and Family: More than three times a week    Frequency of Social Gatherings with Friends and Family: Once a week    Attends Religious Services: 1 to 4 times per year    Active Member of [employer] or Organizations: No    Attends Banker Meetings: Never    Marital Status: Living with partner  Intimate Partner Violence: Not At Risk (07/29/2022)   Humiliation, Afraid, Rape, and Kick questionnaire    Fear of Current or Ex-Partner: No    Emotionally Abused: No    Physically Abused: No    Sexually Abused: No    Family History  Problem Relation Age of Onset   Healthy Mother    Cancer Father        lung   Asthma Father    Hypertension Father    Heart disease Father    Lung disease Father    COPD Father    Healthy Brother    Healthy Brother    Healthy Brother    Hypertension Maternal Grandmother    Cancer Maternal Grandmother        cancer dx 2017   Diabetes Maternal Grandmother    Cancer Maternal Grandfather        unknown   Anesthesia problems Neg Hx    Other Neg Hx      ROS see  HPI   Physical Exam: BP 119/63    Pulse 87   Temp 98.3 F (36.8 C) (Oral)   Resp 15   LMP 06/21/2022 (Approximate)   SpO2 100%   Physical Exam Constitutional:      Appearance: Normal appearance.  Genitourinary:     Genitourinary Comments: Pt reports vaginal bleeding has stopped.   Cardiovascular:     Rate and Rhythm: Normal rate.  Pulmonary:     Effort: Pulmonary effort is normal.  Abdominal:  Comments: gravid  Neurological:     General: No focal deficit present.     Mental Status: She is alert.  Skin:    General: Skin is warm.  Psychiatric:        Mood and Affect: Mood normal.     Consults:  MFM see noted   Significant Findings/ Diagnostic Studies:Initial ultrasound showed : Baby B placenta demonstrates complete previa, similar prior examination and now demonstrates and increasing marginal hemorrhage.  Ultrasound done by MFM at 1000 was not unsuspicious for abruption   CBC WBL, hgb 10.1   Procedures: cord dopplers doen by [facility]   Hospital Course: The patient was admitted to Labor and Delivery Triage for observation. A CBC and type and screen were collected. Over time her vaginal bleeding stopped. Her intiial [place] was concerning for abruption. Her repeat US done by MFM was not concerning for an abruption. [place] report not available at time of discharge. Per Valentino Saxon, who spoke with Dr Parke Poisson, the pt is safe to be discharged home. Follow up appointments have been scheduled. See MFM noted for full report and recommendations. When asked about home meds, pt reported she stopped Zoloft-which was restarted in September. She expressed interest in restarting Zoloft. Will take 25 mg daily x 7 days and then increase to 50 mg daily, aware she may need additional dose increases.   Discharge Condition: good  Disposition: Discharge disposition: 01-Home or Self Care       Diet: Regular diet  Discharge Activity: Activity as tolerated   Allergies as of 11/24/2022       Reactions   Iodine Other (See Comments)    blistering   Sulfonamide Derivatives Other (See Comments)   blistering        Medication List     STOP taking these medications    acetaminophen 325 MG tablet Commonly known as: TYLENOL       TAKE these medications    ACCRUFeR 30 MG Caps Generic drug: Ferric Maltol Take 1 capsule (30 mg total) by mouth in the morning and at bedtime.   albuterol 108 (90 Base) MCG/ACT inhaler Commonly known as: VENTOLIN HFA Inhale 2 puffs into the lungs every 6 (six) hours as needed for wheezing or shortness of breath.   aspirin 81 MG chewable tablet Chew 1 tablet (81 mg total) by mouth daily.   fluticasone 110 MCG/ACT inhaler Commonly known as: FLOVENT HFA INHALE 1 PUFF INTO THE LUNGS TWICE A DAY   metoCLOPramide 5 MG tablet Commonly known as: Reglan Take 1 tablet (5 mg total) by mouth 3 (three) times daily.   metroNIDAZOLE 500 MG tablet Commonly known as: FLAGYL Take 1 tablet (500 mg total) by mouth 2 (two) times daily for 7 days.   PRE-NATAL PO Take by mouth.   sertraline 50 MG tablet Commonly known as: ZOLOFT TAKE 1 TABLET BY MOUTH EVERY DAY         Total time spent taking care of this patient: 20 minutes  Signed: Ellouise Newer [facility], CNM  11/24/2022, 12:47 PM

## 2022-11-24 NOTE — H&P (Incomplete)
History and Physical   HPI  Brittany [name] is a 32 y.o. [id number] at [redacted]w[redacted]d Estimated Date of Delivery: 04/05/23 by 6 week u/s, who is being admitted for observation secondary to vaginal bleeding. She was asleep at home this evening and woke about about 1915 to blood in her bed. Showed pictures of bed, had two plum size clots with larger surrounding areas of blood. Also had one area with slight redness but appeared more more similar to clear fluid mixed with small amount of blood. Also passed one additional plum sized clot since coming to OB/triage. Reports good fetal movement. Denies painful contractions.  Kalman Drape Truax started prenatal care at 5 weeks. Had consistent and routine care complicated by di/di twin gestation, complete previa, recurrent episodes of vaginal bleeding, grand multip, THC use, baby B with IUGR and oligo, BMI 39 at NOB.  Was seen three days ago for vaginal bleeding. At that time reported about two tablespoons of blood on her pad and then having bleeding only with wiping. Was observed and sent home after bleeding resolved. Bleeding tonight has been heavier.   OB History  OB History  Gravida Para Term Preterm AB Living  9 6 6  0 2 6  SAB IAB Ectopic Multiple Live Births  2 0 0 0 6    # Outcome Date GA Lbr Len/2nd Weight Sex Type Anes PTL Lv  9 Current           8 Term 02/12/21 [redacted]w[redacted]d 10:58 / 00:02 2710 g M Vag-Spont None  LIV     Name: MANUELLA, BLACKSON     Apgar1: 8  Apgar5: 9  7 SAB 12/17/19          6 Term 05/18/19 [redacted]w[redacted]d / 00:14 3230 g F Vag-Spont EPI  LIV     Name: Burnis Kingfisher     Apgar1: 9  Apgar5: 9  5 SAB 2020          4 Term 09/03/13 [redacted]w[redacted]d 02:13 / 00:22 3141 g F Vag-Spont EPI  LIV     Name: Burnis Kingfisher     Apgar1: 9  Apgar5: 9  3 Term 12/07/11 [redacted]w[redacted]d / 00:04 3104 g F Vag-Spont None  LIV     Name: [facility]     Apgar1: 8  Apgar5: 9  2 Term 08/30/10 [redacted]w[redacted]d 03:25 / 12:30 3090 g M Vag-Spont None  LIV      Birth Comments: diabetic- oral meds     Name: Zettie Pho     Apgar1: 9  Apgar5: 9  1 Term 06/16/07    M Vag-Spont   LIV    PROBLEM LIST  Pregnancy complications or risks: Patient Active Problem List   Diagnosis Date Noted  . Vaginal bleeding during pregnancy, antepartum 11/23/2022  . Vaginal bleeding in pregnancy, second trimester 11/20/2022  . Tobacco smoker, 1 pack of cigarettes or less per day 11/20/2022  . Grand multipara 11/16/2022  . Placenta previa in second trimester 11/16/2022  . Dizygotic twin pregnancy 09/29/2022  . Obesity affecting pregnancy 09/29/2022  . Supervision of high risk pregnancy, antepartum 07/29/2022  . History of postpartum hemorrhage 05/18/2019  . Asthma 02/25/2018    Prenatal labs and studies: ABO, Rh: --/--/O POS (10/05 1007) Antibody: NEG (10/05 1007) Rubella: 1.01 (08/14 1103) RPR: Non Reactive (08/14 1103)  HBsAg: Negative (08/14 1103)  HIV: Non Reactive (08/14 1103)  H&H:  Hemoglobin  Date Value Ref Range Status  11/20/2022 10.2 (L) 12.0 - 15.0 g/dL Final  09/29/2022 11.5 11.1 - 15.9 g/dL Final  84/69/6295 8.8 (L) 12.0 - 15.0 g/dL Final  28/41/3244 01.0 (L) 12.0 - 15.0 g/dL Final  27/25/3664 40.3 (L) 11.1 - 15.9 g/dL Final  47/42/5956 38.7 (L) 11.1 - 15.9 g/dL Final  56/43/3295 18.8 12.0 - 15.0 g/dL Final  41/66/0630 16.0 (L) 11.1 - 15.9 g/dL Final   & hematocrit  GC/CT: neg/neg   Past Medical History:  Diagnosis Date  . Asthma   . Chlamydia   . Gonorrhea   . Hx MRSA infection 02/16/2003  . Trichimoniasis      Past Surgical History:  Procedure Laterality Date  . MULTIPLE TOOTH EXTRACTIONS       Medications    Current Discharge Medication List     CONTINUE these medications which have NOT CHANGED   Details  acetaminophen (TYLENOL) 325 MG tablet Take 2 tablets (650 mg total) by mouth every 4 (four) hours as needed for moderate pain (for pain scale < 4  OR  temperature  >/=  100.5 F).    albuterol  (VENTOLIN HFA) 108 (90 Base) MCG/ACT inhaler Inhale 2 puffs into the lungs every 6 (six) hours as needed for wheezing or shortness of breath. Qty: 36 g, Refills: 2    aspirin 81 MG chewable tablet Chew 1 tablet (81 mg total) by mouth daily. Qty: 90 tablet, Refills: 3    fluticasone (FLOVENT HFA) 110 MCG/ACT inhaler INHALE 1 PUFF INTO THE LUNGS TWICE A DAY Qty: 12 each, Refills: 1    metoCLOPramide (REGLAN) 5 MG tablet Take 1 tablet (5 mg total) by mouth 3 (three) times daily. Qty: 90 tablet, Refills: 3    metroNIDAZOLE (FLAGYL) 500 MG tablet Take 1 tablet (500 mg total) by mouth 2 (two) times daily for 7 days. Qty: 13 tablet, Refills: 0   Comments: First dose received in hospital prior to discharge    Prenatal Multivit-Min-Fe-FA (PRE-NATAL PO) Take by mouth.    Ferric Maltol (ACCRUFER) 30 MG CAPS Take 1 capsule (30 mg total) by mouth in the morning and at bedtime. Qty: 60 capsule, Refills: 4   Comments: Unable to tolerate ferrous sulfate due to GI upset.    sertraline (ZOLOFT) 50 MG tablet TAKE 1 TABLET BY MOUTH EVERY DAY Qty: 90 tablet, Refills: 3         Allergies  Iodine and Sulfonamide derivatives  Review of Systems  Pertinent items noted in HPI and remainder of comprehensive ROS otherwise negative.  Physical Exam  BP 131/81   Pulse 93   Temp 98.2 F (36.8 C) (Oral)   Resp 18   LMP 06/21/2022 (Approximate)   SpO2 100%   Lungs:  CTA B Cardio: S1S2, RRR Abd: Soft, gravid, NT SVE: deferred  Baby A: FHT baseline 140 Baby B FHT: baseline 160 Toco: no contractions detected   Test Results  Results for orders placed or performed during the hospital encounter of 11/23/22 (from the past 24 hour(s))  Prepare RBC (crossmatch)     Status: None (Preliminary result)   Collection Time: 11/23/22 10:00 PM  Result Value Ref Range   Order Confirmation PENDING    Consulted with Dr. Feliberto Gottron. Will place in observation and monitor throughout the night. Currently  bleeding is light but will watch for heavy bleeding. Per Dr. Feliberto Gottron will get CBC, type and screen, type and cross, place IV and give fluids, place on bedrest, place catheter to allow for bedrest, check heart tones q8hr and complete u/s to look at previa, for  signs of abruption and fluid levels on babies.   Assessment  [id number] at [redacted]w[redacted]d Estimated Date of Delivery: 04/05/23  Di/di twin gestation Known complete previa Episode of heavy vaginal bleeding   Patient Active Problem List   Diagnosis Date Noted  . Vaginal bleeding during pregnancy, antepartum 11/23/2022  . Vaginal bleeding in pregnancy, second trimester 11/20/2022  . Tobacco smoker, 1 pack of cigarettes or less per day 11/20/2022  . Grand multipara 11/16/2022  . Placenta previa in second trimester 11/16/2022  . Dizygotic twin pregnancy 09/29/2022  . Obesity affecting pregnancy 09/29/2022  . Supervision of high risk pregnancy, antepartum 07/29/2022  . History of postpartum hemorrhage 05/18/2019  . Asthma 02/25/2018    Plan  Place in observation   2. EFM per unit policy 3. Labs : T&S, CBC, RPR 4. MD notified of patient admission and status  5. Expectant management  Raeford Razor, CNM, FNP 11/23/2022 10:26 PM

## 2022-11-24 NOTE — OB Triage Note (Signed)
Discharge instructions, labor precautions, and follow-up care reviewed with patient. All questions answered. Patient verbalized understanding. Discharged ambulatory off unit.   

## 2022-11-27 LAB — BPAM RBC
Blood Product Expiration Date: 202411092359
Unit Type and Rh: 5100

## 2022-11-27 LAB — TYPE AND SCREEN
ABO/RH(D): O POS
Antibody Screen: NEGATIVE
Unit division: 0

## 2022-11-28 LAB — PREPARE RBC (CROSSMATCH)

## 2022-11-30 ENCOUNTER — Encounter: Payer: Medicaid Other | Admitting: Obstetrics & Gynecology

## 2022-11-30 ENCOUNTER — Encounter: Payer: Self-pay | Admitting: Certified Nurse Midwife

## 2022-11-30 ENCOUNTER — Ambulatory Visit (INDEPENDENT_AMBULATORY_CARE_PROVIDER_SITE_OTHER): Payer: Medicaid Other | Admitting: Certified Nurse Midwife

## 2022-11-30 VITALS — BP 113/79 | HR 105 | Wt 246.5 lb

## 2022-11-30 DIAGNOSIS — O0992 Supervision of high risk pregnancy, unspecified, second trimester: Secondary | ICD-10-CM

## 2022-11-30 DIAGNOSIS — O30049 Twin pregnancy, dichorionic/diamniotic, unspecified trimester: Secondary | ICD-10-CM

## 2022-11-30 DIAGNOSIS — Z3A22 22 weeks gestation of pregnancy: Secondary | ICD-10-CM

## 2022-11-30 LAB — POCT URINALYSIS DIPSTICK OB
Bilirubin, UA: NEGATIVE
Glucose, UA: NEGATIVE
Ketones, UA: NEGATIVE
Leukocytes, UA: NEGATIVE
Nitrite, UA: NEGATIVE
Spec Grav, UA: 1.01 (ref 1.010–1.025)
Urobilinogen, UA: 0.2 U/dL
pH, UA: 6.5 (ref 5.0–8.0)

## 2022-11-30 NOTE — Progress Notes (Signed)
ROB doing well, feeling movement. Pt state she is trying to stay optimistic and not worry about twin B but "it is stressful". Pt has follow up MFM appointment 10/28, encouraged her to keep appointment. She denies vaginal bleeding today. Follow up 3 weeks .   Doreene Burke, CNM

## 2022-11-30 NOTE — Patient Instructions (Signed)
Round Ligament Pain  The round ligaments are a pair of cord-like tissues that help support the uterus. They can become a source of pain during pregnancy as the ligaments soften and stretch as the baby grows. The pain usually begins in the second trimester (13-28 weeks) of pregnancy, and should only last for a few seconds when it occurs. However, the pain can come and go until the baby is delivered. The pain does not cause harm to the baby. Round ligament pain is usually a short, sharp, and pinching pain, but it can also be a dull, lingering, and aching pain. The pain is felt in the lower side of the abdomen or in the groin. It usually starts deep in the groin and moves up to the outside of the hip area. The pain may happen when you: Suddenly change position, such as quickly going from a sitting to standing position. Do physical activity. Cough or sneeze. Follow these instructions at home: Managing pain  When the pain starts, relax. Then, try any of these methods to help with the pain: Sit down. Flex your knees up to your abdomen. Lie on your side with one pillow under your abdomen and another pillow between your legs. Sit in a warm bath for 15-20 minutes or until the pain goes away. General instructions Watch your condition for any changes. Move slowly when you sit down or stand up. Stop or reduce your physical activities if they cause pain. Avoid long walks if they cause pain. Take over-the-counter and prescription medicines only as told by your health care provider. Keep all follow-up visits. This is important. Contact a health care provider if: Your pain does not go away with treatment. You feel pain in your back that you did not have before. Your medicine is not helping. You have a fever or chills. You have nausea or vomiting. You have diarrhea. You have pain when you urinate. Get help right away if: You have pain that is a rhythmic, cramping pain similar to labor pains. Labor  pains are usually 2 minutes apart, last for about 1 minute, and involve a bearing down feeling or pressure in your pelvis. You have vaginal bleeding. These symptoms may represent a serious problem that is an emergency. Do not wait to see if the symptoms will go away. Get medical help right away. Call your local emergency services (911 in the [place] Do not drive yourself to the hospital. Summary Round ligament pain is felt in the lower abdomen or groin. This pain usually begins in the second trimester (13-28 weeks) and should only last for a few seconds when it occurs. You may notice the pain when you suddenly change position, when you cough or sneeze, or during physical activity. Relaxing, flexing your knees to your abdomen, lying on one side, or taking a warm bath may help to get rid of the pain. Contact your health care provider if the pain does not go away. This information is not intended to replace advice given to you by your health care provider. Make sure you discuss any questions you have with your health care provider. Document Revised: 04/16/2020 Document Reviewed: 04/16/2020 Elsevier Patient Education  2024 Elsevier Inc.  

## 2022-12-01 ENCOUNTER — Other Ambulatory Visit: Payer: Medicaid Other

## 2022-12-07 ENCOUNTER — Ambulatory Visit (INDEPENDENT_AMBULATORY_CARE_PROVIDER_SITE_OTHER): Payer: Medicaid Other | Admitting: Obstetrics & Gynecology

## 2022-12-07 VITALS — BP 117/77 | HR 94 | Wt 249.0 lb

## 2022-12-07 DIAGNOSIS — O30042 Twin pregnancy, dichorionic/diamniotic, second trimester: Secondary | ICD-10-CM

## 2022-12-07 DIAGNOSIS — O30049 Twin pregnancy, dichorionic/diamniotic, unspecified trimester: Secondary | ICD-10-CM

## 2022-12-07 DIAGNOSIS — Z3A23 23 weeks gestation of pregnancy: Secondary | ICD-10-CM

## 2022-12-07 DIAGNOSIS — O4402 Placenta previa specified as without hemorrhage, second trimester: Secondary | ICD-10-CM

## 2022-12-07 NOTE — Progress Notes (Signed)
   PRENATAL VISIT NOTE  Subjective:  Brittany [name] is a 32 y.o. [id number] at [redacted]w[redacted]d being seen today for ongoing prenatal care.  She is currently monitored for the following issues for this high-risk pregnancy and has Asthma; History of postpartum hemorrhage; Supervision of high risk pregnancy, antepartum; Dizygotic twin pregnancy; Obesity affecting pregnancy; Grand multipara; Placenta previa in second trimester; Vaginal bleeding in pregnancy, second trimester; Tobacco smoker, 1 pack of cigarettes or less per day; and Vaginal bleeding during pregnancy, antepartum on their problem list.  Patient reports  that she started bleeding today at work . The bleeding has stopped since then. She denies contractions. She was discharged from the hospital on 11/24/2022. She had been admitted for bleeding. She reports movement from both babies and denies ROM. She is still doing pelvic rest. Contractions: Not present.  .  Movement: Present. Denies leaking of fluid.   The following portions of the patient's history were reviewed and updated as appropriate: allergies, current medications, past family history, past medical history, past social history, past surgical history and problem list.   Objective:   Vitals:   12/07/22 1430  BP: 117/77  Pulse: 94  Weight: 249 lb (112.9 kg)   I used the iPad ultrasound to visualize both babies FHR of both in the 140s, both babies with active movements Twin B with minimal fluid  Fetal Status:     Movement: Present     General:  Alert, oriented and cooperative. Patient is in no acute distress.  Skin: Skin is warm and dry. No rash noted.   Cardiovascular: Normal heart rate noted  Respiratory: Normal respiratory effort, no problems with respiration noted  Abdomen: Soft, gravid, appropriate for gestational age.  Pain/Pressure: Absent     Pelvic: Cervical exam deferred       She showed me her pad in her underwear and there was a brown stain, but nothing red.   Extremities: Normal range of motion.     Mental Status: Normal mood and affect. Normal behavior. Normal judgment and thought content.   Assessment and Plan:  Pregnancy: [id number] at [redacted]w[redacted]d Placenta previa- most likely the etiology of her recurrent bleeding. Bleeding precautions reviewed.  Severe IUGR of twin B along with oligohydramnios of twin B- she has another MFM ultrasound on 12/13/2022. Expectant management.  Preterm labor symptoms and general obstetric precautions including but not limited to vaginal bleeding, contractions, leaking of fluid and fetal movement were reviewed in detail with the patient. Please refer to After Visit Summary for other counseling recommendations.   Come back in 2 weeks/prn sooner  Future Appointments  Date Time Provider Department Center  12/13/2022  8:55 AM Allie Bossier, MD [facility] None  12/13/2022 11:00 AM [facility]    Allie Bossier, MD

## 2022-12-12 ENCOUNTER — Inpatient Hospital Stay
Admission: EM | Admit: 2022-12-12 | Discharge: 2022-12-13 | DRG: 784 | Disposition: A | Discharge status: Other Acute Inpatient Hospital | Payer: Medicaid Other | Attending: Obstetrics and Gynecology | Admitting: Obstetrics and Gynecology

## 2022-12-12 ENCOUNTER — Observation Stay: Payer: Medicaid Other

## 2022-12-12 DIAGNOSIS — O99214 Obesity complicating childbirth: Secondary | ICD-10-CM | POA: Diagnosis present

## 2022-12-12 DIAGNOSIS — Z5986 Financial insecurity: Secondary | ICD-10-CM

## 2022-12-12 DIAGNOSIS — O99892 Other specified diseases and conditions complicating childbirth: Secondary | ICD-10-CM | POA: Diagnosis present

## 2022-12-12 DIAGNOSIS — Z3A23 23 weeks gestation of pregnancy: Secondary | ICD-10-CM

## 2022-12-12 DIAGNOSIS — Z825 Family history of asthma and other chronic lower respiratory diseases: Secondary | ICD-10-CM

## 2022-12-12 DIAGNOSIS — D62 Acute posthemorrhagic anemia: Secondary | ICD-10-CM | POA: Diagnosis not present

## 2022-12-12 DIAGNOSIS — Z7982 Long term (current) use of aspirin: Secondary | ICD-10-CM

## 2022-12-12 DIAGNOSIS — O4102X1 Oligohydramnios, second trimester, fetus 1: Secondary | ICD-10-CM | POA: Diagnosis present

## 2022-12-12 DIAGNOSIS — O4412 Placenta previa with hemorrhage, second trimester: Principal | ICD-10-CM | POA: Diagnosis present

## 2022-12-12 DIAGNOSIS — J45909 Unspecified asthma, uncomplicated: Secondary | ICD-10-CM | POA: Diagnosis present

## 2022-12-12 DIAGNOSIS — Z833 Family history of diabetes mellitus: Secondary | ICD-10-CM

## 2022-12-12 DIAGNOSIS — O328XX2 Maternal care for other malpresentation of fetus, fetus 2: Secondary | ICD-10-CM | POA: Diagnosis present

## 2022-12-12 DIAGNOSIS — Z7951 Long term (current) use of inhaled steroids: Secondary | ICD-10-CM

## 2022-12-12 DIAGNOSIS — O4102X2 Oligohydramnios, second trimester, fetus 2: Secondary | ICD-10-CM | POA: Diagnosis present

## 2022-12-12 DIAGNOSIS — O30049 Twin pregnancy, dichorionic/diamniotic, unspecified trimester: Secondary | ICD-10-CM | POA: Diagnosis present

## 2022-12-12 DIAGNOSIS — O9081 Anemia of the puerperium: Secondary | ICD-10-CM | POA: Diagnosis not present

## 2022-12-12 DIAGNOSIS — O99210 Obesity complicating pregnancy, unspecified trimester: Secondary | ICD-10-CM | POA: Diagnosis present

## 2022-12-12 DIAGNOSIS — O365922 Maternal care for other known or suspected poor fetal growth, second trimester, fetus 2: Secondary | ICD-10-CM | POA: Diagnosis present

## 2022-12-12 DIAGNOSIS — Z8249 Family history of ischemic heart disease and other diseases of the circulatory system: Secondary | ICD-10-CM

## 2022-12-12 DIAGNOSIS — O4402 Placenta previa specified as without hemorrhage, second trimester: Secondary | ICD-10-CM | POA: Diagnosis present

## 2022-12-12 DIAGNOSIS — F17210 Nicotine dependence, cigarettes, uncomplicated: Secondary | ICD-10-CM | POA: Diagnosis present

## 2022-12-12 DIAGNOSIS — O99334 Smoking (tobacco) complicating childbirth: Secondary | ICD-10-CM | POA: Diagnosis present

## 2022-12-12 DIAGNOSIS — O9952 Diseases of the respiratory system complicating childbirth: Secondary | ICD-10-CM | POA: Diagnosis present

## 2022-12-12 DIAGNOSIS — Z8614 Personal history of Methicillin resistant Staphylococcus aureus infection: Secondary | ICD-10-CM

## 2022-12-12 DIAGNOSIS — F1290 Cannabis use, unspecified, uncomplicated: Secondary | ICD-10-CM | POA: Diagnosis present

## 2022-12-12 DIAGNOSIS — Z8659 Personal history of other mental and behavioral disorders: Secondary | ICD-10-CM

## 2022-12-12 DIAGNOSIS — O4410 Placenta previa with hemorrhage, unspecified trimester: Principal | ICD-10-CM | POA: Diagnosis present

## 2022-12-12 DIAGNOSIS — K219 Gastro-esophageal reflux disease without esophagitis: Secondary | ICD-10-CM | POA: Diagnosis present

## 2022-12-12 DIAGNOSIS — R000 Tachycardia, unspecified: Secondary | ICD-10-CM | POA: Diagnosis present

## 2022-12-12 DIAGNOSIS — O30042 Twin pregnancy, dichorionic/diamniotic, second trimester: Secondary | ICD-10-CM | POA: Diagnosis present

## 2022-12-12 DIAGNOSIS — Z641 Problems related to multiparity: Secondary | ICD-10-CM

## 2022-12-12 DIAGNOSIS — O1092 Unspecified pre-existing hypertension complicating childbirth: Secondary | ICD-10-CM | POA: Diagnosis present

## 2022-12-12 DIAGNOSIS — O9962 Diseases of the digestive system complicating childbirth: Secondary | ICD-10-CM | POA: Diagnosis present

## 2022-12-12 DIAGNOSIS — O10912 Unspecified pre-existing hypertension complicating pregnancy, second trimester: Secondary | ICD-10-CM | POA: Diagnosis present

## 2022-12-12 DIAGNOSIS — Z302 Encounter for sterilization: Secondary | ICD-10-CM

## 2022-12-12 HISTORY — DX: Complete placenta previa with hemorrhage, unspecified trimester: O44.10

## 2022-12-12 LAB — CBC
HCT: 23.8 % — ABNORMAL LOW (ref 36.0–46.0)
Hemoglobin: 8.6 g/dL — ABNORMAL LOW (ref 12.0–15.0)
MCH: 27.6 pg (ref 26.0–34.0)
MCHC: 36.1 g/dL — ABNORMAL HIGH (ref 30.0–36.0)
MCV: 76.3 fL — ABNORMAL LOW (ref 80.0–100.0)
Platelets: 260 10*3/uL (ref 150–400)
RBC: 3.12 MIL/uL — ABNORMAL LOW (ref 3.87–5.11)
RDW: 12.5 % (ref 11.5–15.5)
WBC: 19.9 10*3/uL — ABNORMAL HIGH (ref 4.0–10.5)
nRBC: 0 % (ref 0.0–0.2)

## 2022-12-12 LAB — COMPREHENSIVE METABOLIC PANEL
ALT: 7 U/L (ref 0–44)
AST: 18 U/L (ref 15–41)
Albumin: 2.4 g/dL — ABNORMAL LOW (ref 3.5–5.0)
Alkaline Phosphatase: 68 U/L (ref 38–126)
Anion gap: 9 (ref 5–15)
BUN: <5 mg/dL — ABNORMAL LOW (ref 6–20)
CO2: 18 mmol/L — ABNORMAL LOW (ref 22–32)
Calcium: 7.5 mg/dL — ABNORMAL LOW (ref 8.9–10.3)
Chloride: 105 mmol/L (ref 98–111)
Creatinine, Ser: 0.49 mg/dL (ref 0.44–1.00)
GFR, Estimated: >60 mL/min (ref >60)
Glucose, Bld: 139 mg/dL — ABNORMAL HIGH (ref 70–99)
Potassium: 3.1 mmol/L — ABNORMAL LOW (ref 3.5–5.1)
Sodium: 132 mmol/L — ABNORMAL LOW (ref 135–145)
Total Bilirubin: 0.6 mg/dL (ref 0.3–1.2)
Total Protein: 5.9 g/dL — ABNORMAL LOW (ref 6.5–8.1)

## 2022-12-12 LAB — APTT: aPTT: 27 s (ref 24–36)

## 2022-12-12 LAB — PREPARE RBC (CROSSMATCH)

## 2022-12-12 LAB — FIBRINOGEN: Fibrinogen: 446 mg/dL (ref 210–475)

## 2022-12-12 LAB — PROTIME-INR
INR: 1.2 (ref 0.8–1.2)
Prothrombin Time: 15.6 s — ABNORMAL HIGH (ref 11.4–15.2)

## 2022-12-12 MED ORDER — ACETAMINOPHEN 325 MG PO TABS: 650.0000 mg | ORAL_TABLET | ORAL | Status: DC | PRN

## 2022-12-12 MED ORDER — SODIUM CHLORIDE 0.9% IV SOLUTION: Freq: Once | INTRAVENOUS | Status: AC

## 2022-12-12 MED ORDER — CEFAZOLIN SODIUM-DEXTROSE 2-4 GM/100ML-% IV SOLN
INTRAVENOUS | Status: AC
  Filled 2022-12-12: qty 100

## 2022-12-12 MED ORDER — FENTANYL CITRATE (PF) 100 MCG/2ML IJ SOLN
INTRAMUSCULAR | Status: AC
  Filled 2022-12-12: qty 2

## 2022-12-12 MED ORDER — MAGNESIUM SULFATE 40 GM/1000ML IV SOLN
2.0000 g/h | INTRAVENOUS | Status: DC
  Administered 2022-12-12: 2 g/h via INTRAVENOUS
  Filled 2022-12-12: qty 1000

## 2022-12-12 MED ORDER — ONDANSETRON HCL 4 MG/2ML IJ SOLN
INTRAMUSCULAR | Status: AC
  Filled 2022-12-12: qty 2

## 2022-12-12 MED ORDER — METHYLERGONOVINE MALEATE 0.2 MG/ML IJ SOLN
INTRAMUSCULAR | Status: AC
  Filled 2022-12-12: qty 1

## 2022-12-12 MED ORDER — PANTOPRAZOLE SODIUM 40 MG IV SOLR
40.0000 mg | Freq: Two times a day (BID) | INTRAVENOUS | Status: DC
  Administered 2022-12-12: 40 mg via INTRAVENOUS
  Filled 2022-12-12: qty 10

## 2022-12-12 MED ORDER — PHENYLEPHRINE HCL-NACL 20-0.9 MG/250ML-% IV SOLN
INTRAVENOUS | Status: AC
  Filled 2022-12-12: qty 250

## 2022-12-12 MED ORDER — LACTATED RINGERS IV SOLN: 125.0000 mL/h | INTRAVENOUS | Status: DC

## 2022-12-12 MED ORDER — FUROSEMIDE 10 MG/ML IJ SOLN
20.0000 mg | Freq: Once | INTRAMUSCULAR | Status: DC
Start: 2022-12-12 — End: 2022-12-13
  Filled 2022-12-12: qty 2

## 2022-12-12 MED ORDER — CARBOPROST TROMETHAMINE 250 MCG/ML IM SOLN
INTRAMUSCULAR | Status: AC
  Filled 2022-12-12: qty 1

## 2022-12-12 MED ORDER — SOD CITRATE-CITRIC ACID 500-334 MG/5ML PO SOLN
ORAL | Status: AC
  Filled 2022-12-12: qty 15

## 2022-12-12 MED ORDER — TRANEXAMIC ACID-NACL 1000-0.7 MG/100ML-% IV SOLN
INTRAVENOUS | Status: AC
  Filled 2022-12-12: qty 100

## 2022-12-12 MED ORDER — BETAMETHASONE SOD PHOS & ACET 6 (3-3) MG/ML IJ SUSP
12.0000 mg | INTRAMUSCULAR | Status: DC
  Administered 2022-12-12: 12 mg via INTRAMUSCULAR
  Filled 2022-12-12: qty 5

## 2022-12-12 MED ORDER — MORPHINE SULFATE (PF) 2 MG/ML IV SOLN
2.0000 mg | INTRAVENOUS | Status: DC | PRN
  Administered 2022-12-12 (×2): 2 mg via INTRAVENOUS
  Filled 2022-12-12 (×2): qty 1

## 2022-12-12 MED ORDER — SODIUM CHLORIDE 0.9% IV SOLUTION: Freq: Once | INTRAVENOUS | Status: DC

## 2022-12-12 MED ORDER — OXYTOCIN-SODIUM CHLORIDE 30-0.9 UT/500ML-% IV SOLN
INTRAVENOUS | Status: AC
  Filled 2022-12-12: qty 500

## 2022-12-12 MED ORDER — MAGNESIUM SULFATE BOLUS VIA INFUSION
6.0000 g | Freq: Once | INTRAVENOUS | Status: AC
  Administered 2022-12-12: 6 g via INTRAVENOUS
  Filled 2022-12-12: qty 1000

## 2022-12-12 MED ORDER — CHLORHEXIDINE GLUCONATE 0.12 % MT SOLN
OROMUCOSAL | Status: AC
  Administered 2022-12-13: 15 mL
  Filled 2022-12-12: qty 15

## 2022-12-12 MED ORDER — DIPHENHYDRAMINE HCL 25 MG PO CAPS
25.0000 mg | ORAL_CAPSULE | Freq: Once | ORAL | Status: DC
Start: 2022-12-12 — End: 2022-12-13

## 2022-12-12 MED ORDER — PRENATAL MULTIVITAMIN CH: 1.0000 | ORAL_TABLET | Freq: Every day | ORAL | Status: DC

## 2022-12-12 MED ORDER — DOCUSATE SODIUM 100 MG PO CAPS: 100.0000 mg | ORAL_CAPSULE | Freq: Every day | ORAL | Status: DC

## 2022-12-12 MED ORDER — LACTATED RINGERS IV SOLN: INTRAVENOUS | Status: DC

## 2022-12-12 MED ORDER — PANTOPRAZOLE SODIUM 40 MG IV SOLR
40.0000 mg | Freq: Two times a day (BID) | INTRAVENOUS | Status: DC
  Filled 2022-12-12: qty 10

## 2022-12-12 MED ORDER — PROPOFOL 10 MG/ML IV BOLUS
INTRAVENOUS | Status: AC
Start: 2022-12-12 — End: ?
  Filled 2022-12-12: qty 20

## 2022-12-12 MED ORDER — ONDANSETRON HCL 4 MG/2ML IJ SOLN
4.0000 mg | Freq: Four times a day (QID) | INTRAMUSCULAR | Status: DC | PRN
  Administered 2022-12-12: 4 mg via INTRAVENOUS

## 2022-12-12 MED ORDER — CALCIUM CARBONATE ANTACID 500 MG PO CHEW
2.0000 | CHEWABLE_TABLET | ORAL | Status: DC | PRN
Start: 2022-12-12 — End: 2022-12-13

## 2022-12-12 MED ORDER — OXYCODONE-ACETAMINOPHEN 5-325 MG PO TABS: 1.0000 | ORAL_TABLET | Freq: Four times a day (QID) | ORAL | Status: DC | PRN

## 2022-12-12 MED ORDER — LIDOCAINE 5 % EX PTCH
MEDICATED_PATCH | CUTANEOUS | Status: AC
  Filled 2022-12-12: qty 1

## 2022-12-12 NOTE — Consult Note (Signed)
Asked by Dr.Cherry to provide prenatal consultation for this  32 y.o.  [id number] mother who is now [redacted]w[redacted]d with discordant dichorionic female twins and her pregnancy has been complicated by chronic placenta previa with recurrent bleeding. She presented tonight with significant bleeding which has persisted and required PRBC. Twin A is normally grown per [place] with adequate fluid but twin B is severely growth restricted and has been anhydramnic since early October. She was given BMZ tonight and has been started on magnesium sulfate. Both twins have had fetal bradycardia and mother herself is hypotensive. She is too unstable for transfer and will undergo C/section under general anesthesia.  Discussed with patient and FOB the dire predicament for both babies, especially twin B who is probably non-viable. We discussed the outlook for twins at this EGA and offered them the option of comfort care vs full-scale intervention. I also explained that if one or both of them can be resuscitated and stabilized they would have to be transferred to a higher level of care. Mother prefers [facility] and I have spoken to a neonatologist there who confirmed they have space available and can send a transport team if appropriate.   I left the parents to discuss this privately and spoke with mother subsequently. She has requested we attempt resuscitation for twin A and she is even hopeful that twin B will survive but expresses understanding that this is unlikely. I told her we would attempt resuscitation. We expect her to undergo surgery within the next few minutes.  Total time 45 minutes, face-to-face time 25 minutes  JWimmer, MD

## 2022-12-12 NOTE — H&P (Signed)
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  On today's ultrasound exam, a complete placenta previa  continues to be noted.  The posterior placenta appears to be covering the opening of  the internal os.  The patient was advised that the painless  vaginal bleeding and blood clots that she has been passing  are most likely due to the placenta previa.  There was normal amniotic fluid noted around twin A.  Anhydramnios is noted in twin B.  Both fetuses had a normal heart rate.  Fetal movements of  both fetuses were noted today.  Doppler studies of the umbilical arteries showed normal  forward flow in twin A.  There were no signs of absent or  reversed end-diastolic flow in twin A.  Doppler studies of the umbilical arteries for twin B shows  absent end-diastolic flow.  Interrogation of the umbilical  arteries closest to the umbilical cord insertion site in twin B  shows reversed end-diastolic flow.   The patient was advised that placental dysfunction is the  most likely cause of IUGR with anhydramnios in twin B.  Twin  B also has a two-vessel umbilical cord.   She was advised that the most likely outcome for twin B is a  fetal demise within the next few weeks.  She was reassured  that as this is a dizygotic/dichorionic twin gestation, the  demise of one twin will not affect the other.   The patient inquired regarding the possibility of a preterm  delivery once twin B reaches  viability in order to give twin B a  chance for survival.  She was advised that an extremely  preterm delivery will subject twin A (the normal, appropriately  grown fetus) to the long-term effects and complications  associated with a premature delivery.  There is no guarantee  that twin B will be able to survive after delivery due to her  early gestational age and the small fetal size.   As I do not believe that the patient is experiencing a placental  abruption and as her vaginal bleeding is most likely related to  the complete placenta previa, the patient may be discharged  home as long as she is not actively bleeding.   She will return to our office in 2 weeks for another ultrasound  to assess the fetal growth and umbilical artery Doppler  studies.   The patient is comfortable with this management plan.   She stated that all of her questions were answered today.   A total of 60 minutes was spent counseling and coordinating  the care for this patient.  Greater than 50% of the time was  spent in direct face-to-face contact. ----------------------------------------------------------------------                   Ma Rings, MD Electronically Signed Final Report   11/24/2022 01:27 pm ---------------------------------------------------------------------- [place] MFM UA ADDL GEST ----------------------------------------------------------------------  OBSTETRICS REPORT                       (Signed Final 11/24/2022 01:27 pm) ---------------------------------------------------------------------- Patient Info   ID #:       [id number]                          D.O.B.:  11-16-1990 (31 yrs)  Name:  D.O.B.:  1990/05/08 (31 yrs)  Name:       Brittany [name]           Visit Date: 11/24/2022 09:59 am ---------------------------------------------------------------------- Performed By   Attending:        Ma Rings MD         Ref. Address:     [address]                                                             [place]                                                              [address]  Performed By:     Eden Lathe BS      [facility]                                 [facility] at                                                             [facility]  Referred By:      Salomon Mast ---------------------------------------------------------------------- Orders   #  Description                           Code        Ordered By  1  [place] MFM UA CORD DOPPLER                76820.02    YU FANG  2  [place] MFM UA ADDL GEST                   76820.01    YU FANG  3  [place] MFM OB LIMITED                     56213.08    YU FANG ----------------------------------------------------------------------   #  Order #                     Accession #                Episode #  1  [id number]  free DNA test, this is a dizygotic twin  pregnancy, 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  On today's ultrasound exam, a complete placenta previa  continues to be noted.  The posterior placenta appears to be covering the opening of  the internal os.  The patient was advised that the painless  vaginal bleeding and blood clots that she has been passing  are most likely due to the placenta previa.  There was normal amniotic fluid noted around twin A.  Anhydramnios is noted in twin B.  Both fetuses had a normal heart rate.  Fetal movements of  both fetuses were noted today.  Doppler studies of the umbilical arteries showed normal  forward flow in twin A.  There were no signs of absent or  reversed end-diastolic flow in twin A.  Doppler studies of the umbilical arteries for twin B shows  absent end-diastolic flow.  Interrogation of the umbilical  arteries closest to the umbilical cord insertion site in twin B  shows reversed end-diastolic flow.   The patient was advised that placental dysfunction is the  most likely cause of IUGR with anhydramnios in twin B.  Twin  B also has a two-vessel umbilical cord.   She was advised that the most likely outcome for twin B is a  fetal demise within the next few weeks.  She was reassured  that as this is a dizygotic/dichorionic twin gestation, the  demise of one twin will not affect the other.   The patient inquired regarding the possibility of a preterm  delivery once twin B reaches  viability in order to give twin B a  chance for survival.  She was advised that an extremely  preterm delivery will subject twin A (the normal, appropriately  grown fetus) to the long-term effects and complications  associated with a premature delivery.  There is no guarantee  that twin B will be able to survive after delivery due to her  early gestational age and the small fetal size.   As I do not believe that the patient is experiencing a placental  abruption and as her vaginal bleeding is most likely related to  the complete placenta previa, the patient may be discharged  home as long as she is not actively bleeding.   She will return to our office in 2 weeks for another ultrasound  to assess the fetal growth and umbilical artery Doppler  studies.   The patient is comfortable with this management plan.   She stated that all of her questions were answered today.   A total of 60 minutes was spent counseling and coordinating  the care for this patient.  Greater than 50% of the time was  spent in direct face-to-face contact. ----------------------------------------------------------------------                   Ma Rings, MD Electronically Signed Final Report   11/24/2022 01:27 pm ---------------------------------------------------------------------- [place] MFM UA ADDL GEST ----------------------------------------------------------------------  OBSTETRICS REPORT                       (Signed Final 11/24/2022 01:27 pm) ---------------------------------------------------------------------- Patient Info   ID #:       [id number]                          D.O.B.:  11-16-1990 (31 yrs)  Name:  of  the internal os.  The patient was advised that the painless  vaginal bleeding and blood clots that she has been passing  are most likely due to the placenta previa.  There was normal amniotic fluid noted around twin A.  Anhydramnios is noted in twin B.  Both fetuses had a normal heart rate.  Fetal movements of  both fetuses were noted today.  Doppler studies of the umbilical arteries showed normal  forward flow in twin A.  There were no signs of absent or  reversed end-diastolic flow in twin A.  Doppler studies of the umbilical arteries for twin B shows  absent end-diastolic flow.  Interrogation of the umbilical  arteries closest to the umbilical cord insertion site in twin B  shows reversed end-diastolic flow.   The patient was advised that placental dysfunction is the  most likely cause of IUGR with anhydramnios in twin B.  Twin  B also has a two-vessel umbilical cord.   She was advised that the most likely outcome for twin B is a  fetal demise within the next few weeks.  She was reassured  that as this is a dizygotic/dichorionic twin gestation, the  demise of one twin will not  affect the other.   The patient inquired regarding the possibility of a preterm  delivery once twin B reaches viability in order to give twin B a  chance for survival.  She was advised that an extremely  preterm delivery will subject twin A (the normal, appropriately  grown fetus) to the long-term effects and complications  associated with a premature delivery.  There is no guarantee  that twin B will be able to survive after delivery due to her  early gestational age and the small fetal size.   As I do not believe that the patient is experiencing a placental  abruption and as her vaginal bleeding is most likely related to  the complete placenta previa, the patient may be discharged  home as long as she is not actively bleeding.   She will return to our office in 2 weeks for another ultrasound  to assess the fetal growth and umbilical artery Doppler  studies.   The patient is comfortable with this management plan.   She stated that all of her questions were answered today.   A total of 60 minutes was spent counseling and coordinating  the care for this patient.  Greater than 50% of the time was  spent in direct face-to-face contact. ----------------------------------------------------------------------                   Ma Rings, MD Electronically Signed Final Report   11/24/2022 01:27 pm ---------------------------------------------------------------------- [place] MFM UA CORD DOPPLER ----------------------------------------------------------------------  OBSTETRICS REPORT                       (Signed Final 11/24/2022 01:27 pm) ---------------------------------------------------------------------- Patient Info   ID #:       [id number]                          D.O.B.:  August 01, 1990 (31 yrs)  Name:       Brittany [name]           Visit Date: 11/24/2022 09:59 am ---------------------------------------------------------------------- Performed By   Attending:        Ma Rings MD          Ref. 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  On today's ultrasound exam, a complete placenta previa  continues to be noted.  The posterior placenta appears to be covering the opening of  the internal os.  The patient was advised that the painless  vaginal bleeding and blood clots that she has been passing  are most likely due to the placenta previa.  There was normal amniotic fluid noted around twin A.  Anhydramnios is noted in twin B.  Both fetuses had a normal heart rate.  Fetal movements of  both fetuses were noted today.  Doppler studies of the umbilical arteries showed normal  forward flow in twin A.  There were no signs of absent or  reversed end-diastolic flow in twin A.  Doppler studies of the umbilical arteries for twin B shows  absent end-diastolic flow.  Interrogation of the umbilical  arteries closest to the umbilical cord insertion site in twin B  shows reversed end-diastolic flow.   The patient was advised that placental dysfunction is the  most likely cause of IUGR with anhydramnios in twin B.  Twin  B also has a two-vessel umbilical cord.   She was advised that the most likely outcome for twin B is a  fetal demise within the next few weeks.  She was reassured  that as this is a dizygotic/dichorionic twin gestation, the  demise of one twin will not affect the other.   The patient inquired regarding the possibility of a preterm  delivery once twin B reaches  viability in order to give twin B a  chance for survival.  She was advised that an extremely  preterm delivery will subject twin A (the normal, appropriately  grown fetus) to the long-term effects and complications  associated with a premature delivery.  There is no guarantee  that twin B will be able to survive after delivery due to her  early gestational age and the small fetal size.   As I do not believe that the patient is experiencing a placental  abruption and as her vaginal bleeding is most likely related to  the complete placenta previa, the patient may be discharged  home as long as she is not actively bleeding.   She will return to our office in 2 weeks for another ultrasound  to assess the fetal growth and umbilical artery Doppler  studies.   The patient is comfortable with this management plan.   She stated that all of her questions were answered today.   A total of 60 minutes was spent counseling and coordinating  the care for this patient.  Greater than 50% of the time was  spent in direct face-to-face contact. ----------------------------------------------------------------------                   Ma Rings, MD Electronically Signed Final Report   11/24/2022 01:27 pm ---------------------------------------------------------------------- [place] MFM UA ADDL GEST ----------------------------------------------------------------------  OBSTETRICS REPORT                       (Signed Final 11/24/2022 01:27 pm) ---------------------------------------------------------------------- Patient Info   ID #:       [id number]                          D.O.B.:  11-16-1990 (31 yrs)  Name:  D.O.B.:  1990/05/08 (31 yrs)  Name:       Brittany [name]           Visit Date: 11/24/2022 09:59 am ---------------------------------------------------------------------- Performed By   Attending:        Ma Rings MD         Ref. Address:     [address]                                                             [place]                                                              [address]  Performed By:     Eden Lathe BS      [facility]                    RDMS RVT                                 [facility] at                                                             [facility]  Referred By:      Salomon Mast ---------------------------------------------------------------------- Orders   #  Description                           Code        Ordered By  1  [place] MFM UA CORD DOPPLER                76820.02    YU FANG  2  [place] MFM UA ADDL GEST                   76820.01    YU FANG  3  [place] MFM OB LIMITED                     56213.08    YU FANG ----------------------------------------------------------------------   #  Order #                     Accession #                Episode #  1  [id number]  free DNA test, this is a dizygotic twin  pregnancy, 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  On today's ultrasound exam, a complete placenta previa  continues to be noted.  The posterior placenta appears to be covering the opening of  the internal os.  The patient was advised that the painless  vaginal bleeding and blood clots that she has been passing  are most likely due to the placenta previa.  There was normal amniotic fluid noted around twin A.  Anhydramnios is noted in twin B.  Both fetuses had a normal heart rate.  Fetal movements of  both fetuses were noted today.  Doppler studies of the umbilical arteries showed normal  forward flow in twin A.  There were no signs of absent or  reversed end-diastolic flow in twin A.  Doppler studies of the umbilical arteries for twin B shows  absent end-diastolic flow.  Interrogation of the umbilical  arteries closest to the umbilical cord insertion site in twin B  shows reversed end-diastolic flow.   The patient was advised that placental dysfunction is the  most likely cause of IUGR with anhydramnios in twin B.  Twin  B also has a two-vessel umbilical cord.   She was advised that the most likely outcome for twin B is a  fetal demise within the next few weeks.  She was reassured  that as this is a dizygotic/dichorionic twin gestation, the  demise of one twin will not affect the other.   The patient inquired regarding the possibility of a preterm  delivery once twin B reaches  viability in order to give twin B a  chance for survival.  She was advised that an extremely  preterm delivery will subject twin A (the normal, appropriately  grown fetus) to the long-term effects and complications  associated with a premature delivery.  There is no guarantee  that twin B will be able to survive after delivery due to her  early gestational age and the small fetal size.   As I do not believe that the patient is experiencing a placental  abruption and as her vaginal bleeding is most likely related to  the complete placenta previa, the patient may be discharged  home as long as she is not actively bleeding.   She will return to our office in 2 weeks for another ultrasound  to assess the fetal growth and umbilical artery Doppler  studies.   The patient is comfortable with this management plan.   She stated that all of her questions were answered today.   A total of 60 minutes was spent counseling and coordinating  the care for this patient.  Greater than 50% of the time was  spent in direct face-to-face contact. ----------------------------------------------------------------------                   Ma Rings, MD Electronically Signed Final Report   11/24/2022 01:27 pm ---------------------------------------------------------------------- [place] MFM UA ADDL GEST ----------------------------------------------------------------------  OBSTETRICS REPORT                       (Signed Final 11/24/2022 01:27 pm) ---------------------------------------------------------------------- Patient Info   ID #:       [id number]                          D.O.B.:  11-16-1990 (31 yrs)  Name:  D.O.B.:  1990/05/08 (31 yrs)  Name:       Brittany [name]           Visit Date: 11/24/2022 09:59 am ---------------------------------------------------------------------- Performed By   Attending:        Ma Rings MD         Ref. Address:     [address]                                                             [place]                                                              [address]  Performed By:     Eden Lathe BS      [facility]                    RDMS RVT                                 [facility] at                                                             [facility]  Referred By:      Salomon Mast ---------------------------------------------------------------------- Orders   #  Description                           Code        Ordered By  1  [place] MFM UA CORD DOPPLER                76820.02    YU FANG  2  [place] MFM UA ADDL GEST                   76820.01    YU FANG  3  [place] MFM OB LIMITED                     56213.08    YU FANG ----------------------------------------------------------------------   #  Order #                     Accession #                Episode #  1  [id number]  D.O.B.:  1990/05/08 (31 yrs)  Name:       Brittany [name]           Visit Date: 11/24/2022 09:59 am ---------------------------------------------------------------------- Performed By   Attending:        Ma Rings MD         Ref. Address:     [address]                                                             [place]                                                              [address]  Performed By:     Eden Lathe BS      [facility]                    RDMS RVT                                 [facility] at                                                             [facility]  Referred By:      Salomon Mast ---------------------------------------------------------------------- Orders   #  Description                           Code        Ordered By  1  [place] MFM UA CORD DOPPLER                76820.02    YU FANG  2  [place] MFM UA ADDL GEST                   76820.01    YU FANG  3  [place] MFM OB LIMITED                     56213.08    YU FANG ----------------------------------------------------------------------   #  Order #                     Accession #                Episode #  1  [id number]  D.O.B.:  1990/05/08 (31 yrs)  Name:       Brittany [name]           Visit Date: 11/24/2022 09:59 am ---------------------------------------------------------------------- Performed By   Attending:        Ma Rings MD         Ref. Address:     [address]                                                             [place]                                                              [address]  Performed By:     Eden Lathe BS      [facility]                    RDMS RVT                                 [facility] at                                                             [facility]  Referred By:      Salomon Mast ---------------------------------------------------------------------- Orders   #  Description                           Code        Ordered By  1  [place] MFM UA CORD DOPPLER                76820.02    YU FANG  2  [place] MFM UA ADDL GEST                   76820.01    YU FANG  3  [place] MFM OB LIMITED                     56213.08    YU FANG ----------------------------------------------------------------------   #  Order #                     Accession #                Episode #  1  [id number]  free DNA test, this is a dizygotic twin  pregnancy, 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  On today's ultrasound exam, a complete placenta previa  continues to be noted.  The posterior placenta appears to be covering the opening of  the internal os.  The patient was advised that the painless  vaginal bleeding and blood clots that she has been passing  are most likely due to the placenta previa.  There was normal amniotic fluid noted around twin A.  Anhydramnios is noted in twin B.  Both fetuses had a normal heart rate.  Fetal movements of  both fetuses were noted today.  Doppler studies of the umbilical arteries showed normal  forward flow in twin A.  There were no signs of absent or  reversed end-diastolic flow in twin A.  Doppler studies of the umbilical arteries for twin B shows  absent end-diastolic flow.  Interrogation of the umbilical  arteries closest to the umbilical cord insertion site in twin B  shows reversed end-diastolic flow.   The patient was advised that placental dysfunction is the  most likely cause of IUGR with anhydramnios in twin B.  Twin  B also has a two-vessel umbilical cord.   She was advised that the most likely outcome for twin B is a  fetal demise within the next few weeks.  She was reassured  that as this is a dizygotic/dichorionic twin gestation, the  demise of one twin will not affect the other.   The patient inquired regarding the possibility of a preterm  delivery once twin B reaches  viability in order to give twin B a  chance for survival.  She was advised that an extremely  preterm delivery will subject twin A (the normal, appropriately  grown fetus) to the long-term effects and complications  associated with a premature delivery.  There is no guarantee  that twin B will be able to survive after delivery due to her  early gestational age and the small fetal size.   As I do not believe that the patient is experiencing a placental  abruption and as her vaginal bleeding is most likely related to  the complete placenta previa, the patient may be discharged  home as long as she is not actively bleeding.   She will return to our office in 2 weeks for another ultrasound  to assess the fetal growth and umbilical artery Doppler  studies.   The patient is comfortable with this management plan.   She stated that all of her questions were answered today.   A total of 60 minutes was spent counseling and coordinating  the care for this patient.  Greater than 50% of the time was  spent in direct face-to-face contact. ----------------------------------------------------------------------                   Ma Rings, MD Electronically Signed Final Report   11/24/2022 01:27 pm ---------------------------------------------------------------------- [place] MFM UA ADDL GEST ----------------------------------------------------------------------  OBSTETRICS REPORT                       (Signed Final 11/24/2022 01:27 pm) ---------------------------------------------------------------------- Patient Info   ID #:       [id number]                          D.O.B.:  11-16-1990 (31 yrs)  Name:  D.O.B.:  1990/05/08 (31 yrs)  Name:       Brittany [name]           Visit Date: 11/24/2022 09:59 am ---------------------------------------------------------------------- Performed By   Attending:        Ma Rings MD         Ref. Address:     [address]                                                             [place]                                                              [address]  Performed By:     Eden Lathe BS      [facility]                                 [facility] at                                                             [facility]  Referred By:      Salomon Mast ---------------------------------------------------------------------- Orders   #  Description                           Code        Ordered By  1  [place] MFM UA CORD DOPPLER                76820.02    YU FANG  2  [place] MFM UA ADDL GEST                   76820.01    YU FANG  3  [place] MFM OB LIMITED                     56213.08    YU FANG ----------------------------------------------------------------------   #  Order #                     Accession #                Episode #  1  [id number]  of  the internal os.  The patient was advised that the painless  vaginal bleeding and blood clots that she has been passing  are most likely due to the placenta previa.  There was normal amniotic fluid noted around twin A.  Anhydramnios is noted in twin B.  Both fetuses had a normal heart rate.  Fetal movements of  both fetuses were noted today.  Doppler studies of the umbilical arteries showed normal  forward flow in twin A.  There were no signs of absent or  reversed end-diastolic flow in twin A.  Doppler studies of the umbilical arteries for twin B shows  absent end-diastolic flow.  Interrogation of the umbilical  arteries closest to the umbilical cord insertion site in twin B  shows reversed end-diastolic flow.   The patient was advised that placental dysfunction is the  most likely cause of IUGR with anhydramnios in twin B.  Twin  B also has a two-vessel umbilical cord.   She was advised that the most likely outcome for twin B is a  fetal demise within the next few weeks.  She was reassured  that as this is a dizygotic/dichorionic twin gestation, the  demise of one twin will not  affect the other.   The patient inquired regarding the possibility of a preterm  delivery once twin B reaches viability in order to give twin B a  chance for survival.  She was advised that an extremely  preterm delivery will subject twin A (the normal, appropriately  grown fetus) to the long-term effects and complications  associated with a premature delivery.  There is no guarantee  that twin B will be able to survive after delivery due to her  early gestational age and the small fetal size.   As I do not believe that the patient is experiencing a placental  abruption and as her vaginal bleeding is most likely related to  the complete placenta previa, the patient may be discharged  home as long as she is not actively bleeding.   She will return to our office in 2 weeks for another ultrasound  to assess the fetal growth and umbilical artery Doppler  studies.   The patient is comfortable with this management plan.   She stated that all of her questions were answered today.   A total of 60 minutes was spent counseling and coordinating  the care for this patient.  Greater than 50% of the time was  spent in direct face-to-face contact. ----------------------------------------------------------------------                   Ma Rings, MD Electronically Signed Final Report   11/24/2022 01:27 pm ---------------------------------------------------------------------- [place] MFM UA CORD DOPPLER ----------------------------------------------------------------------  OBSTETRICS REPORT                       (Signed Final 11/24/2022 01:27 pm) ---------------------------------------------------------------------- Patient Info   ID #:       [id number]                          D.O.B.:  August 01, 1990 (31 yrs)  Name:       Brittany [name]           Visit Date: 11/24/2022 09:59 am ---------------------------------------------------------------------- Performed By   Attending:        Ma Rings MD          Ref. Address:     [address]  of  the internal os.  The patient was advised that the painless  vaginal bleeding and blood clots that she has been passing  are most likely due to the placenta previa.  There was normal amniotic fluid noted around twin A.  Anhydramnios is noted in twin B.  Both fetuses had a normal heart rate.  Fetal movements of  both fetuses were noted today.  Doppler studies of the umbilical arteries showed normal  forward flow in twin A.  There were no signs of absent or  reversed end-diastolic flow in twin A.  Doppler studies of the umbilical arteries for twin B shows  absent end-diastolic flow.  Interrogation of the umbilical  arteries closest to the umbilical cord insertion site in twin B  shows reversed end-diastolic flow.   The patient was advised that placental dysfunction is the  most likely cause of IUGR with anhydramnios in twin B.  Twin  B also has a two-vessel umbilical cord.   She was advised that the most likely outcome for twin B is a  fetal demise within the next few weeks.  She was reassured  that as this is a dizygotic/dichorionic twin gestation, the  demise of one twin will not  affect the other.   The patient inquired regarding the possibility of a preterm  delivery once twin B reaches viability in order to give twin B a  chance for survival.  She was advised that an extremely  preterm delivery will subject twin A (the normal, appropriately  grown fetus) to the long-term effects and complications  associated with a premature delivery.  There is no guarantee  that twin B will be able to survive after delivery due to her  early gestational age and the small fetal size.   As I do not believe that the patient is experiencing a placental  abruption and as her vaginal bleeding is most likely related to  the complete placenta previa, the patient may be discharged  home as long as she is not actively bleeding.   She will return to our office in 2 weeks for another ultrasound  to assess the fetal growth and umbilical artery Doppler  studies.   The patient is comfortable with this management plan.   She stated that all of her questions were answered today.   A total of 60 minutes was spent counseling and coordinating  the care for this patient.  Greater than 50% of the time was  spent in direct face-to-face contact. ----------------------------------------------------------------------                   Ma Rings, MD Electronically Signed Final Report   11/24/2022 01:27 pm ---------------------------------------------------------------------- [place] MFM UA CORD DOPPLER ----------------------------------------------------------------------  OBSTETRICS REPORT                       (Signed Final 11/24/2022 01:27 pm) ---------------------------------------------------------------------- Patient Info   ID #:       [id number]                          D.O.B.:  August 01, 1990 (31 yrs)  Name:       Brittany [name]           Visit Date: 11/24/2022 09:59 am ---------------------------------------------------------------------- Performed By   Attending:        Ma Rings MD          Ref. Address:     [address]  of  the internal os.  The patient was advised that the painless  vaginal bleeding and blood clots that she has been passing  are most likely due to the placenta previa.  There was normal amniotic fluid noted around twin A.  Anhydramnios is noted in twin B.  Both fetuses had a normal heart rate.  Fetal movements of  both fetuses were noted today.  Doppler studies of the umbilical arteries showed normal  forward flow in twin A.  There were no signs of absent or  reversed end-diastolic flow in twin A.  Doppler studies of the umbilical arteries for twin B shows  absent end-diastolic flow.  Interrogation of the umbilical  arteries closest to the umbilical cord insertion site in twin B  shows reversed end-diastolic flow.   The patient was advised that placental dysfunction is the  most likely cause of IUGR with anhydramnios in twin B.  Twin  B also has a two-vessel umbilical cord.   She was advised that the most likely outcome for twin B is a  fetal demise within the next few weeks.  She was reassured  that as this is a dizygotic/dichorionic twin gestation, the  demise of one twin will not  affect the other.   The patient inquired regarding the possibility of a preterm  delivery once twin B reaches viability in order to give twin B a  chance for survival.  She was advised that an extremely  preterm delivery will subject twin A (the normal, appropriately  grown fetus) to the long-term effects and complications  associated with a premature delivery.  There is no guarantee  that twin B will be able to survive after delivery due to her  early gestational age and the small fetal size.   As I do not believe that the patient is experiencing a placental  abruption and as her vaginal bleeding is most likely related to  the complete placenta previa, the patient may be discharged  home as long as she is not actively bleeding.   She will return to our office in 2 weeks for another ultrasound  to assess the fetal growth and umbilical artery Doppler  studies.   The patient is comfortable with this management plan.   She stated that all of her questions were answered today.   A total of 60 minutes was spent counseling and coordinating  the care for this patient.  Greater than 50% of the time was  spent in direct face-to-face contact. ----------------------------------------------------------------------                   Ma Rings, MD Electronically Signed Final Report   11/24/2022 01:27 pm ---------------------------------------------------------------------- [place] MFM UA CORD DOPPLER ----------------------------------------------------------------------  OBSTETRICS REPORT                       (Signed Final 11/24/2022 01:27 pm) ---------------------------------------------------------------------- Patient Info   ID #:       [id number]                          D.O.B.:  August 01, 1990 (31 yrs)  Name:       Brittany [name]           Visit Date: 11/24/2022 09:59 am ---------------------------------------------------------------------- Performed By   Attending:        Ma Rings MD          Ref. Address:     [address]  D.O.B.:  1990/05/08 (31 yrs)  Name:       Brittany [name]           Visit Date: 11/24/2022 09:59 am ---------------------------------------------------------------------- Performed By   Attending:        Ma Rings MD         Ref. Address:     [address]                                                             [place]                                                              [address]  Performed By:     Eden Lathe BS      [facility]                    RDMS RVT                                 [facility] Care at                                                             [facility]  Referred By:      Salomon Mast ---------------------------------------------------------------------- Orders   #  Description                           Code        Ordered By  1  [place] MFM UA CORD DOPPLER                76820.02    YU FANG  2  [place] MFM UA ADDL GEST                   76820.01    YU FANG  3  [place] MFM OB LIMITED                     56213.08    YU FANG ----------------------------------------------------------------------   #  Order #                     Accession #                Episode #  1  [id number]  D.O.B.:  1990/05/08 (31 yrs)  Name:       Brittany [name]           Visit Date: 11/24/2022 09:59 am ---------------------------------------------------------------------- Performed By   Attending:        Ma Rings MD         Ref. Address:     [address]                                                             [place]                                                              [address]  Performed By:     Eden Lathe BS      [facility]                    RDMS RVT                                 Fetal Care at                                                             [facility]  Referred By:      Salomon Mast ---------------------------------------------------------------------- Orders   #  Description                           Code        Ordered By  1  [place] MFM UA CORD DOPPLER                76820.02    YU FANG  2  [place] MFM UA ADDL GEST                   76820.01    YU FANG  3  [place] MFM OB LIMITED                     56213.08    YU FANG ----------------------------------------------------------------------   #  Order #                     Accession #                Episode #  1  [id number]  of  the internal os.  The patient was advised that the painless  vaginal bleeding and blood clots that she has been passing  are most likely due to the placenta previa.  There was normal amniotic fluid noted around twin A.  Anhydramnios is noted in twin B.  Both fetuses had a normal heart rate.  Fetal movements of  both fetuses were noted today.  Doppler studies of the umbilical arteries showed normal  forward flow in twin A.  There were no signs of absent or  reversed end-diastolic flow in twin A.  Doppler studies of the umbilical arteries for twin B shows  absent end-diastolic flow.  Interrogation of the umbilical  arteries closest to the umbilical cord insertion site in twin B  shows reversed end-diastolic flow.   The patient was advised that placental dysfunction is the  most likely cause of IUGR with anhydramnios in twin B.  Twin  B also has a two-vessel umbilical cord.   She was advised that the most likely outcome for twin B is a  fetal demise within the next few weeks.  She was reassured  that as this is a dizygotic/dichorionic twin gestation, the  demise of one twin will not  affect the other.   The patient inquired regarding the possibility of a preterm  delivery once twin B reaches viability in order to give twin B a  chance for survival.  She was advised that an extremely  preterm delivery will subject twin A (the normal, appropriately  grown fetus) to the long-term effects and complications  associated with a premature delivery.  There is no guarantee  that twin B will be able to survive after delivery due to her  early gestational age and the small fetal size.   As I do not believe that the patient is experiencing a placental  abruption and as her vaginal bleeding is most likely related to  the complete placenta previa, the patient may be discharged  home as long as she is not actively bleeding.   She will return to our office in 2 weeks for another ultrasound  to assess the fetal growth and umbilical artery Doppler  studies.   The patient is comfortable with this management plan.   She stated that all of her questions were answered today.   A total of 60 minutes was spent counseling and coordinating  the care for this patient.  Greater than 50% of the time was  spent in direct face-to-face contact. ----------------------------------------------------------------------                   Ma Rings, MD Electronically Signed Final Report   11/24/2022 01:27 pm ---------------------------------------------------------------------- [place] MFM UA CORD DOPPLER ----------------------------------------------------------------------  OBSTETRICS REPORT                       (Signed Final 11/24/2022 01:27 pm) ---------------------------------------------------------------------- Patient Info   ID #:       [id number]                          D.O.B.:  August 01, 1990 (31 yrs)  Name:       Brittany [name]           Visit Date: 11/24/2022 09:59 am ---------------------------------------------------------------------- Performed By   Attending:        Ma Rings MD          Ref. Address:     [address]  D.O.B.:  1990/05/08 (31 yrs)  Name:       Brittany [name]           Visit Date: 11/24/2022 09:59 am ---------------------------------------------------------------------- Performed By   Attending:        Ma Rings MD         Ref. Address:     [address]                                                             [place]                                                              [address]  Performed By:     Eden Lathe BS      [facility]                    RDMS RVT                                 Fetal Care at                                                             [facility]  Referred By:      Salomon Mast ---------------------------------------------------------------------- Orders   #  Description                           Code        Ordered By  1  [place] MFM UA CORD DOPPLER                76820.02    YU FANG  2  [place] MFM UA ADDL GEST                   76820.01    YU FANG  3  [place] MFM OB LIMITED                     56213.08    YU FANG ----------------------------------------------------------------------   #  Order #                     Accession #                Episode #  1  [id number]  of  the internal os.  The patient was advised that the painless  vaginal bleeding and blood clots that she has been passing  are most likely due to the placenta previa.  There was normal amniotic fluid noted around twin A.  Anhydramnios is noted in twin B.  Both fetuses had a normal heart rate.  Fetal movements of  both fetuses were noted today.  Doppler studies of the umbilical arteries showed normal  forward flow in twin A.  There were no signs of absent or  reversed end-diastolic flow in twin A.  Doppler studies of the umbilical arteries for twin B shows  absent end-diastolic flow.  Interrogation of the umbilical  arteries closest to the umbilical cord insertion site in twin B  shows reversed end-diastolic flow.   The patient was advised that placental dysfunction is the  most likely cause of IUGR with anhydramnios in twin B.  Twin  B also has a two-vessel umbilical cord.   She was advised that the most likely outcome for twin B is a  fetal demise within the next few weeks.  She was reassured  that as this is a dizygotic/dichorionic twin gestation, the  demise of one twin will not  affect the other.   The patient inquired regarding the possibility of a preterm  delivery once twin B reaches viability in order to give twin B a  chance for survival.  She was advised that an extremely  preterm delivery will subject twin A (the normal, appropriately  grown fetus) to the long-term effects and complications  associated with a premature delivery.  There is no guarantee  that twin B will be able to survive after delivery due to her  early gestational age and the small fetal size.   As I do not believe that the patient is experiencing a placental  abruption and as her vaginal bleeding is most likely related to  the complete placenta previa, the patient may be discharged  home as long as she is not actively bleeding.   She will return to our office in 2 weeks for another ultrasound  to assess the fetal growth and umbilical artery Doppler  studies.   The patient is comfortable with this management plan.   She stated that all of her questions were answered today.   A total of 60 minutes was spent counseling and coordinating  the care for this patient.  Greater than 50% of the time was  spent in direct face-to-face contact. ----------------------------------------------------------------------                   Ma Rings, MD Electronically Signed Final Report   11/24/2022 01:27 pm ---------------------------------------------------------------------- [place] MFM UA CORD DOPPLER ----------------------------------------------------------------------  OBSTETRICS REPORT                       (Signed Final 11/24/2022 01:27 pm) ---------------------------------------------------------------------- Patient Info   ID #:       [id number]                          D.O.B.:  August 01, 1990 (31 yrs)  Name:       Brittany [name]           Visit Date: 11/24/2022 09:59 am ---------------------------------------------------------------------- Performed By   Attending:        Ma Rings MD          Ref. Address:     [address]

## 2022-12-12 NOTE — Consult Note (Incomplete)
Asked by Dr.Cherry to provide prenatal consultation for this  32 y.o.  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 She presented tonight with significant bleeding which has persisted and required PRBC. Twin A is normally grown per [place] with adequate fluid but twin B is severely growth restricted and has been anhydramnic since early October. She was given BMZ tonight and has been started on magnesium sulfate. Both twins have had fetal bradycardia and mother herself is hypotensive. She is too unstable for transfer and will undergo C/section under general anesthesia.  Discussed with patient and FOB the dire predicament for both babies, especially twin B who is probably non-viable. We discussed the outlook for twins at this EGA and offered them the option of comfort care vs full-scale intervention. I also explained that if one or both ousual expectations for preterm infant at *** weeks gestation, including possible needs for DR resuscitation, respiratory support, and IV access.  Explained family-centered care approach including parent participation in rounds, decision-making, and also presented usual criteria for discharge. Projected possible length of stay in NICU until [facility].  Discussed advantages of feeding with mother's milk and possible use of donor milk as "bridge" if needed until her supply is sufficient.  Patient and FOB were attentive, had appropriate questions, and expressed appreciation for my input.  Thank you for consulting Neonatology.  Total time *** minutes, face-to-face time *** minutes  JWimmer, MD

## 2022-12-12 NOTE — Plan of Care (Signed)
MFM Plan of care  I was called by Dr. Valentino Saxon regarding Brittany [name] pregnant with Di-di twins at [redacted]w[redacted]d admitted for vaginal bleeding in the setting of a previa. She also has severe IUGR of twin B along with oligohydramnios of twin B. She has not had a recent estimated fetal weight. She is now requiring a blood transfusion and has received a pack of cryo. She is hemodynamically stable currently with a BP of 84-102/47-65 and a pulse in the low 90s-100s. Dr. Valentino Saxon expressed the concern for possible need to delivery as well as transfer [facility] so more blood products are available. She expressed that she is not sable for transport currently and delivery would be a safer option if bleeding continues to be concerning.   I discussed the following recommendations with the OB provider.   Recommend Continue BP support with IV fluids and blood products as needed Continue to work closely with anesthesia  Per the OB provider, the patient is not stable for transfer and delivery would be the safer option if bleeding continues to be clinically significant. Betamethasone course started Magnesium sulfate for fetal neuroprotection Hold tocolytics due to concern for developing hypotension/bleeding risk  If time allows, an ultrasound to obtain fetal biometry should be obtained.  NICU consult to counsel the patient regarding goals of care for fetal resuscitation.  Delivery should occur only via caesarean delivery due to the placenta previa  if: 1) maternal bleeding is significant enough to require massive transfusion or of the patient becomes hemodynamically unstable; 2) if the fetal tracing of either fetus is non-reassuring despite intrauterine resuscitation; 3) other obstetric indications.   Please reach out to MFM with any concerns.

## 2022-12-12 NOTE — OB Triage Note (Signed)
Pt reports to L&D with complaints of excessive vaginal bleeding. She produced a picture of bleeding prior to arrival. Puddle estimated at 50ft x 3 ft. Pt states that bleeding started around 5:40pm. Contractions started when patient was on her way to the hospital around 6pm. Pt states that she has a placenta previa. RN immediately notified CNM and MD.

## 2022-12-12 NOTE — Progress Notes (Signed)
Intrapartum Progress Note  Brittany [name] is a 32 y.o. [id number] at [redacted]w[redacted]d by ultrasound admitted for active hemorrhage secondary to complete placenta previa with did di twins. Still cannot completely rule out at least partial placental abruption. Also with Pregnancy complicated by grand multiparity, obesity in pregnancy, anemia of pregnancy, pre-existing HTN this pregnancy, no meds.   Patient has received 3 units of PRBCs, 1 unit of cryoprecipitate this admission. Currently on Magnesium Sulfate for neuroprotection. Also received initial dose of antenatal steroids at 1938.  S: Patient noting intense pressure, notes that she "just wants the pain and pressure to be over".   O:   Vitals:   12/12/22 2215 12/12/22 2229 12/12/22 2246 12/12/22 2301  BP: 101/67 96/76 (!) 86/57 (!) 92/47  Pulse: (!) 31 93 93 96  Resp: 16 20 20  (!) 24  Temp: (!) 97.5 F (36.4 C) (!) 97.5 F (36.4 C) 97.6 F (36.4 C) 97.6 F (36.4 C)  TempSrc: Axillary Axillary Axillary Axillary  SpO2:    100%   I/O last 3 completed shifts: In: -  Out: 410 [Blood:410] Total I/O In: -  Out: 3260 [Blood:3260]   Gen App: NAD, uncomfortable Abdomen: soft, gravid FHT: Baby A baseline 130, minimal variability, neg accel, repetitive decels present           Baby B interrupted tracing, unable to capture more than 2-3 minutes at a time, FHT 90s.  Tocometer: unable to detect on tocometer however patient still detects, ~ q 5-8 minutes Cervix: deferred. Continues to pass moderate sized blood clots q 20 min.  Extremities: Nontender, no edema.    Labs:     Latest Ref Rng & Units 12/12/2022   11:19 PM 12/12/2022    7:07 PM 11/23/2022   10:32 PM  CBC  WBC 4.0 - 10.5 K/uL 29.9  19.9  11.9   Hemoglobin 12.0 - 15.0 g/dL 9.7  8.6  64.4   Hematocrit 36.0 - 46.0 % 27.6  23.8  28.3   Platelets 150 - 400 K/uL 218  260  258        Latest Ref Rng & Units 12/12/2022    8:41 PM  CMP  Glucose 70 - 99 mg/dL 034   BUN 6 - 20 mg/dL  <5   Creatinine 7.42 - 1.00 mg/dL 5.95   Sodium 638 - 756 mmol/L 132   Potassium 3.5 - 5.1 mmol/L 3.1   Chloride 98 - 111 mmol/L 105   CO2 22 - 32 mmol/L 18   Calcium 8.9 - 10.3 mg/dL 7.5   Total Protein 6.5 - 8.1 g/dL 5.9   Total Bilirubin 0.3 - 1.2 mg/dL 0.6   Alkaline Phos 38 - 126 U/L 68   AST 15 - 41 U/L 18   ALT 0 - 44 U/L 7      Latest Reference Range & Units 12/12/22 23:19  Fibrinogen 210 - 475 mg/dL 433  Prothrombin Time 29.5 - 15.2 seconds 15.6 (H)  INR 0.8 - 1.2  1.2  APTT 24 - 36 seconds 27  (H): Data is abnormally high    Imaging:  US OB Comp AddL Gest + 14 Wk Addendum: ADDENDUM REPORT: 12/12/2022 23:10   ADDENDUM:  Request made for addition of estimated fetal weight for each fetus.  Additional ultrasound images were obtained.   Baby A:   BPD: 5.6 cm 23 weeks 0 days   HC: 21.2 cm 23 weeks 2 days   AC: 18.9 cm 23 weeks 4 days  FL: 4.4 cm 24 weeks 2 days   Mean gestational age: 24 weeks 6 days, ultrasound [facility] 04/04/2023   Estimated fetal weight: 633.5 g (1 pound 6.3 ounces) 62nd percentile   Baby B   BPD: 4.7 cm 20 weeks 1 day   HC: 19.6 cm 21 weeks 6 days   AC: 17.7 cm 22 weeks 4 days   FL: 3.3 cm 20 weeks 3 days   Mean gestational age: 97 weeks 0 days ultrasound [facility] 04/24/2023   Estimated fetal weight: 433.4 g (0 lb 15.03 oz) 1.1 percentile   Electronically Signed    By: Jasmine Pang M.D.    On: 12/12/2022 23:10 Narrative: CLINICAL DATA:  Second trimester vaginal bleeding  EXAM: LIMITED OBSTETRIC ULTRASOUND  FINDINGS: Number of Fetuses:  2  Separating Membrane: Seen on prior ultrasounds  TWIN 1 (A)  Heart Rate:  187 bpm  Movement: Yes  Presentation: Cephalic  Placental Location: Posterior  Previa: No  Amniotic Fluid (Subjective): Within normal limits. Maximum vertical pocket of 4.5 cm  BPD:  5.4cm 22w 2d  TWIN 2 (B )  Heart Rate:  167 bpm  Movement: Yes  Presentation: Cephalic  Placental Location:  Posterior  Previa: Complete  Amniotic Fluid (Subjective): Within normal limits. Maximum vertical pocket of 3.9 cm  BPD:  4.6cm 20w 0d  MATERNAL FINDINGS:  Cervix: Initially contained large amount of echogenic material consistent with blood clot. Per technician, blood clot was removed from the cervix by supervising MD and images were obtained post clot removal. Approximate cervical length measurement of 3.98 cm.  Uterus/Adnexae: No abnormality visualized.  IMPRESSION: 1. Twin gestation, dichorionic diamniotic by prior imaging reports. Size discrepancy with twin B smaller than twin A. 2. Large amount of hemorrhagic material at the cervix at initial imaging. This was subsequently removed and repeat cervical length measurement of 3.98 cm obtained. Redemonstrated complete placenta previa for twin B.  This exam is performed on an emergent basis and does not comprehensively evaluate fetal size, dating, or anatomy; follow-up complete OB US should be considered if further fetal assessment is warranted  Electronically Signed: By: Jasmine Pang M.D. On: 12/12/2022 20:33 US OB Limited Addendum: ADDENDUM REPORT: 12/12/2022 23:10   ADDENDUM:  Request made for addition of estimated fetal weight for each fetus.  Additional ultrasound images were obtained.   Baby A:   BPD: 5.6 cm 23 weeks 0 days   HC: 21.2 cm 23 weeks 2 days   AC: 18.9 cm 23 weeks 4 days   FL: 4.4 cm 24 weeks 2 days   Mean gestational age: 43 weeks 6 days, ultrasound [facility] 04/04/2023   Estimated fetal weight: 633.5 g (1 pound 6.3 ounces) 62nd percentile   Baby B   BPD: 4.7 cm 20 weeks 1 day   HC: 19.6 cm 21 weeks 6 days   AC: 17.7 cm 22 weeks 4 days   FL: 3.3 cm 20 weeks 3 days   Mean gestational age: 97 weeks 0 days ultrasound [facility] 04/24/2023   Estimated fetal weight: 433.4 g (0 lb 15.03 oz) 1.1 percentile   Electronically Signed    By: Jasmine Pang M.D.    On: 12/12/2022 23:10 Narrative:  CLINICAL DATA:  Second trimester vaginal bleeding  EXAM: LIMITED OBSTETRIC ULTRASOUND  FINDINGS: Number of Fetuses:  2  Separating Membrane: Seen on prior ultrasounds  TWIN 1 (A)  Heart Rate:  187 bpm  Movement: Yes  Presentation: Cephalic  Placental Location: Posterior  Previa: No  Amniotic  Fluid (Subjective): Within normal limits. Maximum vertical pocket of 4.5 cm  BPD:  5.4cm 22w 2d  TWIN 2 (B )  Heart Rate:  167 bpm  Movement: Yes  Presentation: Cephalic  Placental Location: Posterior  Previa: Complete  Amniotic Fluid (Subjective): Within normal limits. Maximum vertical pocket of 3.9 cm  BPD:  4.6cm 20w 0d  MATERNAL FINDINGS:  Cervix: Initially contained large amount of echogenic material consistent with blood clot. Per technician, blood clot was removed from the cervix by supervising MD and images were obtained post clot removal. Approximate cervical length measurement of 3.98 cm.  Uterus/Adnexae: No abnormality visualized.  IMPRESSION: 1. Twin gestation, dichorionic diamniotic by prior imaging reports. Size discrepancy with twin B smaller than twin A. 2. Large amount of hemorrhagic material at the cervix at initial imaging. This was subsequently removed and repeat cervical length measurement of 3.98 cm obtained. Redemonstrated complete placenta previa for twin B.  This exam is performed on an emergent basis and does not comprehensively evaluate fetal size, dating, or anatomy; follow-up complete OB US should be considered if further fetal assessment is warranted  Electronically Signed: By: Jasmine Pang M.D. On: 12/12/2022 20:33   Assessment:  1: SIUP at [redacted]w[redacted]d 2. Di-di twin pregnancy, with IUGR, oligohydramnios/anhydramnios, and complete placenta previa of Twin B.  3. Grand multiparity  4. Obesity in pregnancy,  5. Anemia of pregnancy 6.Pre-existing HTN this pregnancy, no meds.   Plan:  1. Patient with continued heavy bleeding, at  the point of initiating massive transfusion protocol. BPs continue to be low-normal at this time. Had mild episodes of tachycardia which are intermittent. Due to this and non-reassuring fetal tracing of Twin B, discussion had with patient regarding need to proceed with C-section delivery at this time. Discussion had regarding resuscitation of Twin A, patient ok to attempt, desires infant transfer to [facility] once stabilized. 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   2. Counseled on risks, benefits of C-section. Consents signed. Patient at risk for PPH, will have hemorrhage cart available in OR. Also will have 2 additional units in OR on standby.  3.  Continue Magnesium sulfate until on OR bed.  4. Discussed contraceptive desires again with patient. Still desiring BTL in light of current pregnancy situation, and knowing potential outcome of both fetuses. Added to consent form.  5. Anesthesia aware and ready, blood bank on standby. Also have ordered platelets. Although no current need, there are no platelets in house, closest location to receive platelets is [place], [place] (~ 2 hrs away). Ordered preemptively.  6. Neonatology to be present at delivery.     Hildred Laser, MD 12/12/2022 11:42 PM

## 2022-12-12 NOTE — Progress Notes (Incomplete)
Intrapartum Progress Note  Brittany [name] is a 32 y.o. [id number] at [redacted]w[redacted]d by ultrasound admitted for active hemorrhage secondary to complete placenta previa with did di twins. Still cannot completely rule out at least partial placental abruption. Also with Pregnancy complicated by grand multiparity, obesity in pregnancy, anemia of pregnancy, pre-existing HTN this pregnancy, no meds.   Patient has received 3 units of PRBCs, 1 unit of cryoprecipitate this admission. Currently on Magnesium Sulfate for neuroprotection. Also received initial dose of antenatal steroids at 1938.  S: Patient noting intense pressure, notes that she "just wants the pain and pressure to be over".   O:   Vitals:   12/12/22 2215 12/12/22 2229 12/12/22 2246 12/12/22 2301  BP: 101/67 96/76 (!) 86/57 (!) 92/47  Pulse: (!) 31 93 93 96  Resp: 16 20 20  (!) 24  Temp: (!) 97.5 F (36.4 C) (!) 97.5 F (36.4 C) 97.6 F (36.4 C) 97.6 F (36.4 C)  TempSrc: Axillary Axillary Axillary Axillary  SpO2:    100%   I/O last 3 completed shifts: In: -  Out: 410 [Blood:410] Total I/O In: -  Out: 3260 [Blood:3260]   Gen App: NAD, uncomfortable Abdomen: soft, gravid FHT: Baby A baseline 130, minimal variability, neg accel, repetitive decels present           Baby B interrupted tracing, unable to capture more than 2-3 minutes at a time, FHT 90s.  Tocometer: unable to detect on tocometer however patient still detects, ~ q 5-8 minutes Cervix: deferred. Continues to pass moderate sized blood clots q 20 min.  Extremities: Nontender, no edema.    Labs:     Latest Ref Rng & Units 12/12/2022   11:19 PM 12/12/2022    7:07 PM 11/23/2022   10:32 PM  CBC  WBC 4.0 - 10.5 K/uL 29.9  19.9  11.9   Hemoglobin 12.0 - 15.0 g/dL 9.7  8.6  45.4   Hematocrit 36.0 - 46.0 % 27.6  23.8  28.3   Platelets 150 - 400 K/uL 218  260  258        Latest Ref Rng & Units 12/12/2022    8:41 PM  CMP  Glucose 70 - 99 mg/dL 098   BUN 6 - 20 mg/dL  <5   Creatinine 1.19 - 1.00 mg/dL 1.47   Sodium 829 - 562 mmol/L 132   Potassium 3.5 - 5.1 mmol/L 3.1   Chloride 98 - 111 mmol/L 105   CO2 22 - 32 mmol/L 18   Calcium 8.9 - 10.3 mg/dL 7.5   Total Protein 6.5 - 8.1 g/dL 5.9   Total Bilirubin 0.3 - 1.2 mg/dL 0.6   Alkaline Phos 38 - 126 U/L 68   AST 15 - 41 U/L 18   ALT 0 - 44 U/L 7      Latest Reference Range & Units 12/12/22 23:19  Fibrinogen 210 - 475 mg/dL 130  Prothrombin Time 86.5 - 15.2 seconds 15.6 (H)  INR 0.8 - 1.2  1.2  APTT 24 - 36 seconds 27  (H): Data is abnormally high    Imaging:  US OB Comp AddL Gest + 14 Wk Addendum: ADDENDUM REPORT: 12/12/2022 23:10   ADDENDUM:  Request made for addition of estimated fetal weight for each fetus.  Additional ultrasound images were obtained.   Baby A:   BPD: 5.6 cm 23 weeks 0 days   HC: 21.2 cm 23 weeks 2 days   AC: 18.9 cm 23 weeks 4 days  FL: 4.4 cm 24 weeks 2 days   Mean gestational age: 42 weeks 6 days, ultrasound [facility] 04/04/2023   Estimated fetal weight: 633.5 g (1 pound 6.3 ounces) 62nd percentile   Baby B   BPD: 4.7 cm 20 weeks 1 day   HC: 19.6 cm 21 weeks 6 days   AC: 17.7 cm 22 weeks 4 days   FL: 3.3 cm 20 weeks 3 days   Mean gestational age: 91 weeks 0 days ultrasound [facility] 04/24/2023   Estimated fetal weight: 433.4 g (0 lb 15.03 oz) 1.1 percentile   Electronically Signed    By: Jasmine Pang M.D.    On: 12/12/2022 23:10 Narrative: CLINICAL DATA:  Second trimester vaginal bleeding  EXAM: LIMITED OBSTETRIC ULTRASOUND  FINDINGS: Number of Fetuses:  2  Separating Membrane: Seen on prior ultrasounds  TWIN 1 (A)  Heart Rate:  187 bpm  Movement: Yes  Presentation: Cephalic  Placental Location: Posterior  Previa: No  Amniotic Fluid (Subjective): Within normal limits. Maximum vertical pocket of 4.5 cm  BPD:  5.4cm 22w 2d  TWIN 2 (B )  Heart Rate:  167 bpm  Movement: Yes  Presentation: Cephalic  Placental Location:  Posterior  Previa: Complete  Amniotic Fluid (Subjective): Within normal limits. Maximum vertical pocket of 3.9 cm  BPD:  4.6cm 20w 0d  MATERNAL FINDINGS:  Cervix: Initially contained large amount of echogenic material consistent with blood clot. Per technician, blood clot was removed from the cervix by supervising MD and images were obtained post clot removal. Approximate cervical length measurement of 3.98 cm.  Uterus/Adnexae: No abnormality visualized.  IMPRESSION: 1. Twin gestation, dichorionic diamniotic by prior imaging reports. Size discrepancy with twin B smaller than twin A. 2. Large amount of hemorrhagic material at the cervix at initial imaging. This was subsequently removed and repeat cervical length measurement of 3.98 cm obtained. Redemonstrated complete placenta previa for twin B.  This exam is performed on an emergent basis and does not comprehensively evaluate fetal size, dating, or anatomy; follow-up complete OB US should be considered if further fetal assessment is warranted  Electronically Signed: By: Jasmine Pang M.D. On: 12/12/2022 20:33 US OB Limited Addendum: ADDENDUM REPORT: 12/12/2022 23:10   ADDENDUM:  Request made for addition of estimated fetal weight for each fetus.  Additional ultrasound images were obtained.   Baby A:   BPD: 5.6 cm 23 weeks 0 days   HC: 21.2 cm 23 weeks 2 days   AC: 18.9 cm 23 weeks 4 days   FL: 4.4 cm 24 weeks 2 days   Mean gestational age: 93 weeks 6 days, ultrasound [facility] 04/04/2023   Estimated fetal weight: 633.5 g (1 pound 6.3 ounces) 62nd percentile   Baby B   BPD: 4.7 cm 20 weeks 1 day   HC: 19.6 cm 21 weeks 6 days   AC: 17.7 cm 22 weeks 4 days   FL: 3.3 cm 20 weeks 3 days   Mean gestational age: 91 weeks 0 days ultrasound [facility] 04/24/2023   Estimated fetal weight: 433.4 g (0 lb 15.03 oz) 1.1 percentile   Electronically Signed    By: Jasmine Pang M.D.    On: 12/12/2022 23:10 Narrative:  CLINICAL DATA:  Second trimester vaginal bleeding  EXAM: LIMITED OBSTETRIC ULTRASOUND  FINDINGS: Number of Fetuses:  2  Separating Membrane: Seen on prior ultrasounds  TWIN 1 (A)  Heart Rate:  187 bpm  Movement: Yes  Presentation: Cephalic  Placental Location: Posterior  Previa: No  Amniotic  Fluid (Subjective): Within normal limits. Maximum vertical pocket of 4.5 cm  BPD:  5.4cm 22w 2d  TWIN 2 (B )  Heart Rate:  167 bpm  Movement: Yes  Presentation: Cephalic  Placental Location: Posterior  Previa: Complete  Amniotic Fluid (Subjective): Within normal limits. Maximum vertical pocket of 3.9 cm  BPD:  4.6cm 20w 0d  MATERNAL FINDINGS:  Cervix: Initially contained large amount of echogenic material consistent with blood clot. Per technician, blood clot was removed from the cervix by supervising MD and images were obtained post clot removal. Approximate cervical length measurement of 3.98 cm.  Uterus/Adnexae: No abnormality visualized.  IMPRESSION: 1. Twin gestation, dichorionic diamniotic by prior imaging reports. Size discrepancy with twin B smaller than twin A. 2. Large amount of hemorrhagic material at the cervix at initial imaging. This was subsequently removed and repeat cervical length measurement of 3.98 cm obtained. Redemonstrated complete placenta previa for twin B.  This exam is performed on an emergent basis and does not comprehensively evaluate fetal size, dating, or anatomy; follow-up complete OB US should be considered if further fetal assessment is warranted  Electronically Signed: By: Jasmine Pang M.D. On: 12/12/2022 20:33   Assessment:  1: SIUP at [redacted]w[redacted]d 2. Di-di twin pregnancy, with IUGR, oligohydramnios, and complete placenta previa of Twin B.   Plan:  1. Patient with continued h bleeding, at the point of initiating massive transfusion protocol. Due to this    Hildred Laser, MD 12/12/2022 11:42 PM

## 2022-12-12 NOTE — Progress Notes (Signed)
Brittany [name] is a 32 y.o. [id number] at [redacted]w[redacted]d by ultrasound admitted for active bleeding secondary to placenta previa with did di twins   Subjective: Cotrina continues to have waves of nausea and pain. Starting to feel tired. Passing moderate to large clots around every 20 mins.   Objective: BP 102/65   Pulse 91   Temp (!) 97.5 F (36.4 C) (Axillary)   Resp 18   LMP 06/21/2022 (Approximate)   SpO2 100%  I/O last 3 completed shifts: In: -  Out: 410 [Blood:410] Total I/O In: -  Out: 3100 [Blood:3100]  FHT: Baby A baseline 130, minimal variability, neg accel, decels present           Baby B baseline 110 moderate variability, neg accel, neg decel  UC:   irregular, every 3-5 minutes   Labs: Lab Results  Component Value Date   WBC 19.9 (H) 12/12/2022   HGB 8.6 (L) 12/12/2022   HCT 23.8 (L) 12/12/2022   MCV 76.3 (L) 12/12/2022   PLT 260 12/12/2022    Assessment / Plan: Did di twins at 23wk5d, actively  bleeding   -Magnesium infusing -3 units PRBCs, chrio ordered -Betamethasone x1  -Morphine PRN for Pain  -QBL 3,500 -Neo and MFM consulted -Growth [place] ordered  -anticipate need for c-section  -Dr Valentino Saxon present at bedside   Ellouise Newer [facility], CNM 12/12/2022, 10:17 PM

## 2022-12-12 NOTE — Anesthesia Preprocedure Evaluation (Signed)
Anesthesia Evaluation  Patient identified by MRN, date of birth, ID band Patient awake    Reviewed: Allergy & Precautions, NPO status , Patient's Chart, lab work & pertinent test results  History of Anesthesia Complications Negative for: history of anesthetic complications  Airway Mallampati: III  TM Distance: >3 FB Neck ROM: full    Dental  (+) Chipped   Pulmonary asthma , Current Smoker   Pulmonary exam normal        Cardiovascular (-) hypertension(-) angina (-) Past MI negative cardio ROS Normal cardiovascular exam     Neuro/Psych negative neurological ROS  negative psych ROS   GI/Hepatic negative GI ROS, Neg liver ROS,neg GERD  ,,  Endo/Other  negative endocrine ROS    Renal/GU      Musculoskeletal   Abdominal   Peds  Hematology negative hematology ROS (+)   Anesthesia Other Findings Placenta previa and bleeding  Past Medical History: No date: Asthma No date: Chlamydia No date: Gonorrhea 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 No date: Trichimoniasis  Past Surgical History: No date: MULTIPLE TOOTH EXTRACTIONS     Reproductive/Obstetrics negative OB ROS                             Anesthesia Physical Anesthesia Plan  ASA: 5 and emergent  Anesthesia Plan: General ETT   Post-op Pain Management:    Induction: Intravenous  PONV Risk Score and Plan: Ondansetron, Dexamethasone, Midazolam and Treatment may vary due to age or medical condition  Airway Management Planned: Oral ETT and Video Laryngoscope Planned  Additional Equipment:   Intra-op Plan:   Post-operative Plan: Extubation in OR and Possible Post-op intubation/ventilation  Informed Consent: I have reviewed the patients History and Physical, chart, labs and discussed the procedure including the risks, benefits and alternatives for the proposed anesthesia with the patient or authorized representative who has indicated  his/her understanding and acceptance.     Dental Advisory Given  Plan Discussed with: Anesthesiologist, CRNA and Surgeon  Anesthesia Plan Comments: (Patient consented for risks of anesthesia including but not limited to:  - adverse reactions to medications - damage to eyes, teeth, lips or other oral mucosa - nerve damage due to positioning  - sore throat or hoarseness - Damage to heart, brain, nerves, lungs, other parts of body or loss of life  Patient voiced understanding and assent.)       Anesthesia Quick Evaluation

## 2022-12-13 ENCOUNTER — Inpatient Hospital Stay: Admission: AD | Admit: 2022-12-13 | Payer: Medicaid Other | Admitting: Obstetrics and Gynecology

## 2022-12-13 ENCOUNTER — Encounter
Admission: EM | Disposition: A | Discharge status: Other Acute Inpatient Hospital | Payer: Self-pay | Source: Home / Self Care | Attending: Obstetrics and Gynecology

## 2022-12-13 ENCOUNTER — Encounter: Payer: Self-pay | Admitting: Obstetrics and Gynecology

## 2022-12-13 ENCOUNTER — Observation Stay: Payer: Medicaid Other | Admitting: Anesthesiology

## 2022-12-13 ENCOUNTER — Ambulatory Visit: Payer: Medicaid Other

## 2022-12-13 ENCOUNTER — Other Ambulatory Visit: Payer: Self-pay

## 2022-12-13 ENCOUNTER — Encounter: Payer: Medicaid Other | Admitting: Obstetrics & Gynecology

## 2022-12-13 DIAGNOSIS — D62 Acute posthemorrhagic anemia: Secondary | ICD-10-CM | POA: Diagnosis not present

## 2022-12-13 DIAGNOSIS — O99214 Obesity complicating childbirth: Secondary | ICD-10-CM

## 2022-12-13 DIAGNOSIS — Z833 Family history of diabetes mellitus: Secondary | ICD-10-CM | POA: Diagnosis not present

## 2022-12-13 DIAGNOSIS — F1290 Cannabis use, unspecified, uncomplicated: Secondary | ICD-10-CM | POA: Diagnosis present

## 2022-12-13 DIAGNOSIS — O9902 Anemia complicating childbirth: Secondary | ICD-10-CM

## 2022-12-13 DIAGNOSIS — O365921 Maternal care for other known or suspected poor fetal growth, second trimester, fetus 1: Secondary | ICD-10-CM | POA: Diagnosis not present

## 2022-12-13 DIAGNOSIS — O9962 Diseases of the digestive system complicating childbirth: Secondary | ICD-10-CM | POA: Diagnosis present

## 2022-12-13 DIAGNOSIS — E669 Obesity, unspecified: Secondary | ICD-10-CM

## 2022-12-13 DIAGNOSIS — O4412 Placenta previa with hemorrhage, second trimester: Secondary | ICD-10-CM | POA: Diagnosis present

## 2022-12-13 DIAGNOSIS — O1002 Pre-existing essential hypertension complicating childbirth: Secondary | ICD-10-CM

## 2022-12-13 DIAGNOSIS — O30042 Twin pregnancy, dichorionic/diamniotic, second trimester: Secondary | ICD-10-CM

## 2022-12-13 DIAGNOSIS — O9081 Anemia of the puerperium: Secondary | ICD-10-CM | POA: Diagnosis not present

## 2022-12-13 DIAGNOSIS — O365922 Maternal care for other known or suspected poor fetal growth, second trimester, fetus 2: Secondary | ICD-10-CM | POA: Diagnosis present

## 2022-12-13 DIAGNOSIS — O4402 Placenta previa specified as without hemorrhage, second trimester: Secondary | ICD-10-CM

## 2022-12-13 DIAGNOSIS — O0942 Supervision of pregnancy with grand multiparity, second trimester: Secondary | ICD-10-CM

## 2022-12-13 DIAGNOSIS — Z5986 Financial insecurity: Secondary | ICD-10-CM | POA: Diagnosis not present

## 2022-12-13 DIAGNOSIS — Z825 Family history of asthma and other chronic lower respiratory diseases: Secondary | ICD-10-CM | POA: Diagnosis not present

## 2022-12-13 DIAGNOSIS — O328XX2 Maternal care for other malpresentation of fetus, fetus 2: Secondary | ICD-10-CM | POA: Diagnosis present

## 2022-12-13 DIAGNOSIS — Z3A23 23 weeks gestation of pregnancy: Secondary | ICD-10-CM

## 2022-12-13 DIAGNOSIS — O4102X2 Oligohydramnios, second trimester, fetus 2: Secondary | ICD-10-CM | POA: Diagnosis present

## 2022-12-13 DIAGNOSIS — J45909 Unspecified asthma, uncomplicated: Secondary | ICD-10-CM | POA: Diagnosis present

## 2022-12-13 DIAGNOSIS — O99334 Smoking (tobacco) complicating childbirth: Secondary | ICD-10-CM | POA: Diagnosis present

## 2022-12-13 DIAGNOSIS — O99892 Other specified diseases and conditions complicating childbirth: Secondary | ICD-10-CM | POA: Diagnosis present

## 2022-12-13 DIAGNOSIS — F17210 Nicotine dependence, cigarettes, uncomplicated: Secondary | ICD-10-CM | POA: Diagnosis present

## 2022-12-13 DIAGNOSIS — O4692 Antepartum hemorrhage, unspecified, second trimester: Secondary | ICD-10-CM | POA: Diagnosis present

## 2022-12-13 DIAGNOSIS — Z7982 Long term (current) use of aspirin: Secondary | ICD-10-CM | POA: Diagnosis not present

## 2022-12-13 DIAGNOSIS — Z302 Encounter for sterilization: Secondary | ICD-10-CM | POA: Diagnosis not present

## 2022-12-13 DIAGNOSIS — O1092 Unspecified pre-existing hypertension complicating childbirth: Secondary | ICD-10-CM | POA: Diagnosis present

## 2022-12-13 DIAGNOSIS — O4102X1 Oligohydramnios, second trimester, fetus 1: Secondary | ICD-10-CM | POA: Diagnosis present

## 2022-12-13 DIAGNOSIS — O9952 Diseases of the respiratory system complicating childbirth: Secondary | ICD-10-CM | POA: Diagnosis present

## 2022-12-13 DIAGNOSIS — D649 Anemia, unspecified: Secondary | ICD-10-CM

## 2022-12-13 DIAGNOSIS — Z8659 Personal history of other mental and behavioral disorders: Secondary | ICD-10-CM

## 2022-12-13 DIAGNOSIS — O10912 Unspecified pre-existing hypertension complicating pregnancy, second trimester: Secondary | ICD-10-CM | POA: Diagnosis present

## 2022-12-13 LAB — RAPID HIV SCREEN (HIV 1/2 AB+AG)
HIV 1/2 Antibodies: NONREACTIVE
HIV-1 P24 Antigen - HIV24: NONREACTIVE

## 2022-12-13 LAB — TYPE AND SCREEN
ABO/RH(D): O POS
Antibody Screen: NEGATIVE
Unit division: 0
Unit division: 0
Unit division: 0
Unit division: 0
Unit division: 0
Unit division: 0
Unit division: 0
Unit division: 0

## 2022-12-13 LAB — CBC WITH DIFFERENTIAL/PLATELET
Abs Immature Granulocytes: 0.39 10*3/uL — ABNORMAL HIGH (ref 0.00–0.07)
Basophils Relative: 0 %
Basophils Relative: 0.1 10*3/uL (ref 0.0–0.1)
Eosinophils Relative: 0 %
Eosinophils Relative: 0 10*3/uL (ref 0.0–0.5)
HCT: 27.6 % — ABNORMAL LOW (ref 36.0–46.0)
Hemoglobin: 9.7 g/dL — ABNORMAL LOW (ref 12.0–15.0)
Immature Granulocytes: 1 %
Lymphocytes Relative: 6 %
Lymphs Abs: 1.8 10*3/uL (ref 0.7–4.0)
MCH: 28.4 pg (ref 26.0–34.0)
MCHC: 35.1 g/dL (ref 30.0–36.0)
MCV: 80.9 fL (ref 80.0–100.0)
Monocytes Relative: 0.7 10*3/uL (ref 0.1–1.0)
Monocytes Relative: 2 10*3/uL (ref 0.7–4.0)
Neutro Abs: 27 10*3/uL — ABNORMAL HIGH (ref 1.7–7.7)
Neutrophils Relative %: 91 %
Platelets: 218 10*3/uL (ref 150–400)
RBC: 3.41 MIL/uL — ABNORMAL LOW (ref 3.87–5.11)
RDW: 13.9 % (ref 11.5–15.5)
Smear Review: NORMAL
WBC: 29.9 10*3/uL — ABNORMAL HIGH (ref 4.0–10.5)
nRBC: 0.1 % (ref 0.0–0.2)

## 2022-12-13 LAB — PREPARE CRYOPRECIPITATE: Unit division: 0

## 2022-12-13 LAB — RPR: RPR Ser Ql: NONREACTIVE

## 2022-12-13 LAB — BPAM RBC
Blood Product Expiration Date: 202411232359
Blood Product Expiration Date: 202411232359
Blood Product Expiration Date: 202411232359
Blood Product Expiration Date: 202411232359
Blood Product Expiration Date: 202411242359
Blood Product Expiration Date: 202411242359
Blood Product Expiration Date: 202411242359
Blood Product Expiration Date: 202411252359
ISSUE DATE / TIME: 202410272028
ISSUE DATE / TIME: 202410272110
ISSUE DATE / TIME: 202410272141
ISSUE DATE / TIME: 202410272347
ISSUE DATE / TIME: 202410272347
ISSUE DATE / TIME: 202410272347
Unit Type and Rh: 5100
Unit Type and Rh: 5100
Unit Type and Rh: 5100
Unit Type and Rh: 5100
Unit Type and Rh: 5100
Unit Type and Rh: 5100
Unit Type and Rh: 5100
Unit Type and Rh: 5100

## 2022-12-13 LAB — CBC
HCT: 26 % — ABNORMAL LOW (ref 36.0–46.0)
Hemoglobin: 9.1 g/dL — ABNORMAL LOW (ref 12.0–15.0)
MCH: 28.1 pg (ref 26.0–34.0)
MCHC: 35 g/dL (ref 30.0–36.0)
MCV: 80.2 fL (ref 80.0–100.0)
Platelets: 143 10*3/uL — ABNORMAL LOW (ref 150–400)
RBC: 3.24 MIL/uL — ABNORMAL LOW (ref 3.87–5.11)
RDW: 13.4 % (ref 11.5–15.5)
WBC: 26.5 10*3/uL — ABNORMAL HIGH (ref 4.0–10.5)
nRBC: 0 % (ref 0.0–0.2)

## 2022-12-13 LAB — BPAM CRYOPRECIPITATE
Blood Product Expiration Date: 202410280250
ISSUE DATE / TIME: 202410272236
Unit Type and Rh: 6200

## 2022-12-13 LAB — CHLAMYDIA/NGC RT PCR (ARMC ONLY)
Chlamydia Tr: NOT DETECTED
N gonorrhoeae: NOT DETECTED

## 2022-12-13 SURGERY — Surgical Case
Anesthesia: General

## 2022-12-13 MED ORDER — POTASSIUM CHLORIDE CRYS ER 10 MEQ PO TBCR
40.0000 meq | EXTENDED_RELEASE_TABLET | Freq: Once | ORAL | Status: AC
  Administered 2022-12-13: 40 meq via ORAL
  Filled 2022-12-13: qty 4

## 2022-12-13 MED ORDER — LIDOCAINE 5 % EX PTCH
MEDICATED_PATCH | CUTANEOUS | Status: AC
  Filled 2022-12-13: qty 1

## 2022-12-13 MED ORDER — ALBUTEROL SULFATE (2.5 MG/3ML) 0.083% IN NEBU: 3.0000 mL | INHALATION_SOLUTION | Freq: Four times a day (QID) | RESPIRATORY_TRACT | Status: DC | PRN

## 2022-12-13 MED ORDER — PRENATAL MULTIVITAMIN CH
1.0000 | ORAL_TABLET | Freq: Every day | ORAL | Status: DC
  Administered 2022-12-13: 1 via ORAL
  Filled 2022-12-13: qty 1

## 2022-12-13 MED ORDER — FERROUS SULFATE 325 (65 FE) MG PO TABS
325.0000 mg | ORAL_TABLET | Freq: Every day | ORAL | Status: DC
  Administered 2022-12-13: 325 mg via ORAL
  Filled 2022-12-13: qty 1

## 2022-12-13 MED ORDER — OXYCODONE HCL 5 MG PO TABS: 5.0000 mg | ORAL_TABLET | ORAL | Status: DC | PRN

## 2022-12-13 MED ORDER — METHYLERGONOVINE MALEATE 0.2 MG/ML IJ SOLN: 0.2000 mg | INTRAMUSCULAR | Status: DC | PRN

## 2022-12-13 MED ORDER — KETOROLAC TROMETHAMINE 30 MG/ML IJ SOLN
30.0000 mg | Freq: Four times a day (QID) | INTRAMUSCULAR | Status: DC
  Administered 2022-12-13 (×2): 30 mg via INTRAVENOUS
  Filled 2022-12-13 (×2): qty 1

## 2022-12-13 MED ORDER — DEXAMETHASONE SODIUM PHOSPHATE 10 MG/ML IJ SOLN
INTRAMUSCULAR | Status: DC | PRN
  Administered 2022-12-13: 8 mg via INTRAVENOUS

## 2022-12-13 MED ORDER — DIPHENHYDRAMINE HCL 25 MG PO CAPS: 25.0000 mg | ORAL_CAPSULE | Freq: Four times a day (QID) | ORAL | Status: DC | PRN

## 2022-12-13 MED ORDER — OXYCODONE HCL 5 MG PO TABS
5.0000 mg | ORAL_TABLET | Freq: Once | ORAL | Status: AC | PRN
  Administered 2022-12-13: 5 mg via ORAL
  Filled 2022-12-13: qty 1

## 2022-12-13 MED ORDER — ACETAMINOPHEN 500 MG PO TABS
1000.0000 mg | ORAL_TABLET | Freq: Four times a day (QID) | ORAL | Status: DC
  Administered 2022-12-13: 1000 mg via ORAL
  Filled 2022-12-13: qty 2

## 2022-12-13 MED ORDER — BUPIVACAINE HCL (PF) 0.5 % IJ SOLN
INTRAMUSCULAR | Status: AC
  Filled 2022-12-13: qty 60

## 2022-12-13 MED ORDER — OXYTOCIN-SODIUM CHLORIDE 30-0.9 UT/500ML-% IV SOLN
INTRAVENOUS | Status: AC
  Filled 2022-12-13: qty 500

## 2022-12-13 MED ORDER — FENTANYL CITRATE (PF) 100 MCG/2ML IJ SOLN
INTRAMUSCULAR | Status: AC
  Filled 2022-12-13: qty 2

## 2022-12-13 MED ORDER — SIMETHICONE 80 MG PO CHEW: 80.0000 mg | CHEWABLE_TABLET | ORAL | Status: DC | PRN

## 2022-12-13 MED ORDER — SODIUM CHLORIDE 0.9 % IV SOLN: INTRAVENOUS | Status: DC | PRN

## 2022-12-13 MED ORDER — KETOROLAC TROMETHAMINE 30 MG/ML IJ SOLN: 30.0000 mg | Freq: Four times a day (QID) | INTRAMUSCULAR | 0 refills | Status: DC

## 2022-12-13 MED ORDER — ZOLPIDEM TARTRATE 5 MG PO TABS: 5.0000 mg | ORAL_TABLET | Freq: Every evening | ORAL | Status: DC | PRN

## 2022-12-13 MED ORDER — ACETAMINOPHEN 500 MG PO TABS: 1000.0000 mg | ORAL_TABLET | Freq: Four times a day (QID) | ORAL | 0 refills | Status: AC

## 2022-12-13 MED ORDER — METHYLERGONOVINE MALEATE 0.2 MG PO TABS: 0.2000 mg | ORAL_TABLET | ORAL | Status: DC | PRN

## 2022-12-13 MED ORDER — PROPOFOL 10 MG/ML IV BOLUS
INTRAVENOUS | Status: DC | PRN
  Administered 2022-12-13: 120 mg via INTRAVENOUS

## 2022-12-13 MED ORDER — BUPIVACAINE HCL 0.5 % IJ SOLN
INTRAMUSCULAR | Status: DC | PRN
  Administered 2022-12-13: 60 mL

## 2022-12-13 MED ORDER — SUCCINYLCHOLINE CHLORIDE 200 MG/10ML IV SOSY
PREFILLED_SYRINGE | INTRAVENOUS | Status: DC | PRN
  Administered 2022-12-13: 120 mg via INTRAVENOUS

## 2022-12-13 MED ORDER — DIBUCAINE (PERIANAL) 1 % EX OINT: 1.0000 | TOPICAL_OINTMENT | CUTANEOUS | Status: DC | PRN

## 2022-12-13 MED ORDER — OXYTOCIN-SODIUM CHLORIDE 30-0.9 UT/500ML-% IV SOLN
INTRAVENOUS | Status: DC | PRN
  Administered 2022-12-13: 250 mL/h via INTRAVENOUS

## 2022-12-13 MED ORDER — CEFAZOLIN SODIUM-DEXTROSE 2-3 GM-%(50ML) IV SOLR
INTRAVENOUS | Status: DC | PRN
  Administered 2022-12-13: 2 g via INTRAVENOUS

## 2022-12-13 MED ORDER — PHENYLEPHRINE HCL-NACL 20-0.9 MG/250ML-% IV SOLN
INTRAVENOUS | Status: DC | PRN
  Administered 2022-12-13: 25 ug/min via INTRAVENOUS

## 2022-12-13 MED ORDER — MORPHINE SULFATE (PF) 2 MG/ML IV SOLN: 1.0000 mg | INTRAVENOUS | Status: DC | PRN

## 2022-12-13 MED ORDER — COCONUT OIL OIL: 1.0000 | TOPICAL_OIL | Status: DC | PRN

## 2022-12-13 MED ORDER — SIMETHICONE 80 MG PO CHEW
80.0000 mg | CHEWABLE_TABLET | Freq: Three times a day (TID) | ORAL | Status: DC
  Administered 2022-12-13: 80 mg via ORAL
  Filled 2022-12-13: qty 1

## 2022-12-13 MED ORDER — ACETAMINOPHEN 10 MG/ML IV SOLN
INTRAVENOUS | Status: AC
  Filled 2022-12-13: qty 100

## 2022-12-13 MED ORDER — FENTANYL CITRATE (PF) 100 MCG/2ML IJ SOLN
25.0000 ug | INTRAMUSCULAR | Status: DC | PRN
  Administered 2022-12-13: 50 ug via INTRAVENOUS
  Filled 2022-12-13: qty 2

## 2022-12-13 MED ORDER — OXYTOCIN-SODIUM CHLORIDE 30-0.9 UT/500ML-% IV SOLN
2.5000 [IU]/h | INTRAVENOUS | Status: DC
  Administered 2022-12-13: 2.5 [IU]/h via INTRAVENOUS

## 2022-12-13 MED ORDER — LIDOCAINE 5 % EX PTCH
MEDICATED_PATCH | CUTANEOUS | Status: DC | PRN
  Administered 2022-12-13: 1 via TRANSDERMAL

## 2022-12-13 MED ORDER — MENTHOL 3 MG MT LOZG: 1.0000 | LOZENGE | OROMUCOSAL | Status: DC | PRN

## 2022-12-13 MED ORDER — OXYCODONE HCL 5 MG/5ML PO SOLN
5.0000 mg | Freq: Once | ORAL | Status: AC | PRN
Start: 2022-12-13 — End: 2022-12-13

## 2022-12-13 MED ORDER — ACETAMINOPHEN 10 MG/ML IV SOLN
INTRAVENOUS | Status: DC | PRN
  Administered 2022-12-13: 1000 mg via INTRAVENOUS

## 2022-12-13 MED ORDER — ONDANSETRON HCL 4 MG/2ML IJ SOLN
INTRAMUSCULAR | Status: DC | PRN
  Administered 2022-12-13: 4 mg via INTRAVENOUS

## 2022-12-13 MED ORDER — SENNOSIDES-DOCUSATE SODIUM 8.6-50 MG PO TABS: 2.0000 | ORAL_TABLET | Freq: Every day | ORAL | Status: DC

## 2022-12-13 MED ORDER — IBUPROFEN 600 MG PO TABS: 600.0000 mg | ORAL_TABLET | Freq: Four times a day (QID) | ORAL | Status: DC

## 2022-12-13 MED ORDER — WITCH HAZEL-GLYCERIN EX PADS
1.0000 | MEDICATED_PAD | CUTANEOUS | Status: DC | PRN
Start: 2022-12-13 — End: 2022-12-13

## 2022-12-13 MED ORDER — FENTANYL CITRATE (PF) 100 MCG/2ML IJ SOLN
INTRAMUSCULAR | Status: DC | PRN
  Administered 2022-12-13: 75 ug via INTRAVENOUS
  Administered 2022-12-13: 50 ug via INTRAVENOUS
  Administered 2022-12-13: 25 ug via INTRAVENOUS
  Administered 2022-12-13: 50 ug via INTRAVENOUS

## 2022-12-13 SURGICAL SUPPLY — 31 items
APL PRP STRL LF DISP 70% ISPRP (MISCELLANEOUS) ×2
BAG COUNTER SPONGE SURGICOUNT (BAG) ×2 IMPLANT
BAG SPNG CNTER NS LX DISP (BAG) ×1
BNDG TENSOPLAST 6X5 (GAUZE/BANDAGES/DRESSINGS) IMPLANT
CHLORAPREP W/TINT 26 (MISCELLANEOUS) ×4 IMPLANT
DRSG TELFA 3X4 N-ADH STERILE (GAUZE/BANDAGES/DRESSINGS) IMPLANT
DRSG TELFA 3X8 NADH STRL (GAUZE/BANDAGES/DRESSINGS) ×2 IMPLANT
ELECT REM PT RETURN 9FT ADLT (ELECTROSURGICAL) ×1
ELECTRODE REM PT RTRN 9FT ADLT (ELECTROSURGICAL) ×2 IMPLANT
EXTRT SYSTEM ALEXIS 17CM (MISCELLANEOUS)
GAUZE SPONGE 4X4 12PLY STRL (GAUZE/BANDAGES/DRESSINGS) ×2 IMPLANT
GAUZE SPONGE 4X4 8PLY STRL (GAUZE/BANDAGES/DRESSINGS) IMPLANT
GLOVE BIO SURGEON STRL SZ 6.5 (GLOVE) ×2 IMPLANT
GLOVE INDICATOR 7.0 STRL GRN (GLOVE) ×2 IMPLANT
GOWN STRL REUS W/ TWL LRG LVL3 (GOWN DISPOSABLE) ×4 IMPLANT
GOWN STRL REUS W/TWL LRG LVL3 (GOWN DISPOSABLE) ×2
KIT TURNOVER KIT A (KITS) ×2 IMPLANT
MANIFOLD NEPTUNE II (INSTRUMENTS) ×2 IMPLANT
MAT PREVALON FULL STRYKER (MISCELLANEOUS) ×2 IMPLANT
NS IRRIG 1000ML POUR BTL (IV SOLUTION) ×2 IMPLANT
PACK C SECTION AR (MISCELLANEOUS) ×2 IMPLANT
PAD OB MATERNITY 4.3X12.25 (PERSONAL CARE ITEMS) ×2 IMPLANT
PAD PREP OB/GYN DISP 24X41 (PERSONAL CARE ITEMS) ×2 IMPLANT
SCRUB CHG 4% DYNA-HEX 4OZ (MISCELLANEOUS) ×2 IMPLANT
SUT MNCRL AB 4-0 PS2 18 (SUTURE) ×2 IMPLANT
SUT VIC AB 0 CT1 36 (SUTURE) ×8 IMPLANT
SUT VIC AB 3-0 SH 27 (SUTURE) ×1
SUT VIC AB 3-0 SH 27X BRD (SUTURE) ×2 IMPLANT
SYSTEM CONTND EXTRCTN KII BLLN (MISCELLANEOUS) IMPLANT
TRAP FLUID SMOKE EVACUATOR (MISCELLANEOUS) ×2 IMPLANT
WATER STERILE IRR 500ML POUR (IV SOLUTION) ×2 IMPLANT

## 2022-12-13 NOTE — Progress Notes (Signed)
RT on standby for delivery of twin birth via c-section.

## 2022-12-13 NOTE — Progress Notes (Signed)
Pt transferred to [facility] specialty unit bed [id number] via [facility]. Report given to transporters and Futures trader at [facility].

## 2022-12-13 NOTE — Op Note (Signed)
Cesarean Section Procedure Note  Indications: placenta previa, maternal hemorrhage (EBL 3260 ml), di-di twin pregnancy at 23.6 weeks, non-reassuring fetal tracing, s/p blood transfusion of 3 units PRBCs, 1 unit of cryoprecipitate pre-operatively.  Pre-operative Diagnosis: 23 week 6 day pregnancy, di-di twin pregnancy (Twin B with complete placenta previa, IUGR (1%ile as of today's imaging), oligohydramnios (AFI 3.9 cm) was previously anhydramnios.  Pregnancy also complicated by obesity in pregnancy, grand multiparity, pre-existing HTN diagnosed this pregnancy (no meds), and unwanted fertility.  Post-operative Diagnosis: Same  Surgeon: Hildred Laser, MD  Assistants:  Carie Caddy, CNM  Procedure: Primary Classical Cesarean Section  Anesthesia: General anesthesia  Findings: - Twin A: Female infant, delivered en caul presentation, 660 grams, with Apgar scores of 0 at one minute and 5 at five minutes, and 8 at 10 minutes. - Twin B: Female infant, delivered from footling breech presentation, . No fetal heart tones at time of delivery. 400 grams, with Apgar scores of 0 at one minute and 0 at five minutes,  - Slightly fragmented placenta with 3 vessel cord of Twin A. Intact placenta upon removal of Twin B, with discoloration of membranes (grey-yellow). .  - Clear amniotic fluid at amniotomy of Twin A.  No amniotic sac identified with Twin B as bilateral feet were encountered after delivery of Twin A.   - The uterine outline, tubes and ovaries appeared normal.   Procedure Details: The patient was seen in the Holding Room. The risks, benefits, complications, treatment options, and expected outcomes were discussed with the patient.  The patient concurred with the proposed plan, giving informed consent.  The site of surgery properly noted/marked. The patient was taken to the Operating Room, identified as Brittany [name] and the procedure verified as C-Section Delivery. A Time Out was held and the  above information confirmed.  The patient was draped and prepped in the usual sterile manner. The decision was made to proceed with General Anesthesia for induction due to anticipation of possible needs for further invasive interventions perioperatively. A Pfannenstiel incision was made and carried down through the subcutaneous tissue to the fascia. Fascial incision was made and extended transversely. The fascia was separated from the underlying rectus tissue superiorly and inferiorly. The peritoneum was identified, entered bluntly, and extended longitudinally.  A bladder blade and Richardson retractor were used to retract the upper and lower abdomen.  The surgical assist was able to provide retraction to allow for clear visualization of surgical site. Due to the urgent nature of the procedure a bladder flap was not created. A vertical uterine incision was made. Delivered en caul was a 660 gram Female with Apgar scores of 0 at one minute, 5 at five minutes, and 8 at 10 minutes. After the umbilical cord was clamped and cut, cord blood was obtained for evaluation. Attempts were made to collect a cord gas, however scant blood was noted in the vessels. Delayed cord clamping was not observed due to significant prematurity. Infant was handed off to awaiting Neonatology team.  Twin B was then delivered footling breech, with head now in the lower uterine segment, no longer contained in amniotic sac. Twin B was a 400 gram Female with Apgar scores of 0 at one minute and 5 at five minutes, and was handed off to awaiting Neonatology team..  The placentas were removed (Twin A placenta appeared slightly fragmented, Twin B placenta intact but with grey-yellow hue to membranes). The uterus was exteriorized and cleared of all clots and debris. The uterine  outline, tubes and ovaries appeared normal.  The uterine incision was closed with running locked sutures of 0-Vicryl.  A second suture of 0-Vicryl was used in an imbricating layer.   A third suture of 0-Vicryl was used to imbricate in a baseball stitch manner, incorporating the serosa.   Hemostasis was observed.   Attention was then turned to the fallopian tubes, and where the patient's right fallopian tube was identified and grasped with a Babcock clamp.  The tube was then followed out to the vimbria.  The Babcock clamp was then used to grasp the tube approximately 4 cm from the cornual region.  A 3 cm segment of tube was then ligated with a free tie of 0-Chromic using the 3 method and excised.  The left fallopian tube was then ligated in a similar fashion and excised. The tubal lumens were cauterized bilaterally.  Good hemostasis was noted with bilateral fallopian tubes. The uterus was then returned to the abdomen.   The pericolic gutters were cleared of all clots and debris.  The fascia was then reapproximated with a running suture of 0-Vicryl. The fascia was injected with 30 ml of Sensorcaine mixture (60 ml of 0.5% Sesnorcaine mixed with 20 ml of normal saline). The subcutaneous fat layer was reapproximated with 2-0 Vicryl. The skin was reapproximated with 4-0 Monocryl. An addition 30 ml of Sensorcaine mixture was injected into the skin and subcutaneous tissue. The skin was approximated with a subcuticular stitch of 4-0 Monocryl. The incision was covered with steri-strips and a pressure dressing.   Patient received an additional 1 unit of PRBCs and 1 unit of FFP intraoperatively.   Instrument, sponge, and needle counts were correct prior the abdominal closure and at the conclusion of the case.    An experienced assistant was required given the standard of surgical care given the complexity of the case.  This assistant was needed for exposure, dissection, suctioning, retraction, instrument exchange, and for overall help during the procedure.  Estimated Blood Loss:  475 ml      Drains: foley catheter to gravity drainage, 200 ml of clear urine at end of the procedure          Total IV Fluids:  1100 ml  Specimens:  Segments of bilateral fallopian tubes, di-di twin placentas         Implants: None         Complications:  None; patient tolerated the procedure well.         Disposition: PACU - hemodynamically stable.         Condition: stable    Hildred Laser, MD [place] [facility] at [facility]

## 2022-12-13 NOTE — Discharge Summary (Signed)
Postpartum Discharge Summary     Patient Name: Brittany [name] DOB: 25-Jun-1990 MRN: [id number]  Date of admission: 12/12/2022 Delivery date:   Tequisha, Mcmonagle [[id number]]  12/13/2022    Branden, Ferrar [[id number]]  12/13/2022 Delivering provider:    Latorria, Muralles [[id number]]  Hildred Laser    [place], Boy Giorgia [[id number]]  Hildred Laser Date of discharge: 12/13/2022  Admitting diagnosis: Antepartum hemorrhage from placenta previa [O44.10] Intrauterine pregnancy: [redacted]w[redacted]d     Secondary diagnosis:  Principal Problem:   Antepartum hemorrhage from placenta previa Active Problems:   Asthma   Dizygotic twin pregnancy   Obesity affecting pregnancy   Grand multipara   Placenta previa in second trimester   Tobacco smoker, 1 pack of cigarettes or less per day   Marijuana use   Pre-existing hypertension affecting pregnancy in second trimester   History of depression   Postpartum care following cesarean delivery  Additional problems: antepartum hemorrhage    Discharge diagnosis: Preterm Pregnancy Delivered                                              Post partum procedures:postpartum tubal ligation Augmentation: N/A Complications: Twin B stillborn; Twin A transferred to [facility] for NICU care.  Hospital course: Antepartum hemorrhage With Unplanned C/S   32 y.o. yo [id number] at 107w6d was admitted for vaginal bleeding and contractions on 12/12/2022. Pregnancy complicated by di/di twin gestation; complete previa; Twin B with anhydramnios & IUGR. Due to hemorrhage of she received a total of 4 units PRBCs, 1 unit FFP & 1 unit cryoprecipitate. The patient went for cesarean section due to  complete previa, hemorrhage, non-reassuring fetal heart rate tracing . Delivery details as follows: Membrane Rupture Time/Date:    Walburga, Pawson [[id number]]  12:31 AM    Emme, Litwiller [[id number]]  12:33 AM,   Samarya, Kovack [[id number]]  12/13/2022    Alphonsine, Biel [[id number]]  12/13/2022  Delivery Method:   Aliyanna, Gendler [[id number]]  C-Section, Classical    Chrstina, Moench [[id number]]  C-Section, Classical Operative Delivery:N/A Details of operation can be found in separate operative note. Patient had a postpartum course complicated by none. She is appropriately grieving her son Maverick and desires transfer to [facility] for proximity to her surviving son.  She is ambulating, tolerating a regular diet, has not yet passed flatus, and urinating well.  Patient is discharged and transferred to [facility] in stable condition 12/13/22 for proximity to infant.  Newborn Data: Birth date:   Tabatha, Santalucia [[id number]]  12/13/2022    Delyn, Schellinger [[id number]]  12/13/2022 Birth time:   Tyjai, Overcash [[id number]]  12:34 AM    Asuka, Litzinger [[id number]]  12:34 AM Gender:   Etienne, Joss [[id number]]  Female    Jeraldy, Addonizio [[id number]]  Female Living status:   Elaynah, Aring [[id number]]  [place], Boy Takylah [[id number]]  Fetal Demise Apgars:   Zyaire, Mietus [[id number]]  [address], Boy Dayza [[id number]]   ,   PJASNKNLZ, JQB H ALPFXTKWI [[id number]]  5    [place], Boy Arlie [[id number]]    Weight:   Laronna, Redinger [[id number]]      Zariana, Charo [[id number]]  Magnesium Sulfate received: Yes: Neuroprotection BMZ received: Yes, single dose 12/12/22 1938 Rhophylac:N/A MMR:N/A T-DaP: No Flu: No RSV Vaccine received: No Transfusion:Yes Immunizations administered: Immunization History  Administered Date(s) Administered   Influenza Split 12/08/2011   Pneumococcal Polysaccharide-23 12/08/2011   Tdap 08/31/2010, 03/21/2019, 12/08/2020    Physical exam  Vitals:   12/13/22 0730 12/13/22 0935 12/13/22 1130  12/13/22 1155  BP: 119/87 129/89  126/82  Pulse: 90 71  79  Resp: 20 20  18   Temp: 97.7 F (36.5 C)   97.7 F (36.5 C)  TempSrc: Oral   Oral  SpO2:  95% 100%   Weight:      Height:       General: alert, cooperative, and no distress Lochia: appropriate Uterine Fundus: firm Incision: Dressing is clean, dry, and intact DVT Evaluation: No evidence of DVT seen on physical exam. Labs: Lab Results  Component Value Date   WBC 26.5 (H) 12/13/2022   HGB 9.1 (L) 12/13/2022   HCT 26.0 (L) 12/13/2022   MCV 80.2 12/13/2022   PLT 143 (L) 12/13/2022      Latest Ref Rng & Units 12/12/2022    8:41 PM  CMP  Glucose 70 - 99 mg/dL 536   BUN 6 - 20 mg/dL <5   Creatinine 6.44 - 1.00 mg/dL 0.34   Sodium 742 - 595 mmol/L 132   Potassium 3.5 - 5.1 mmol/L 3.1   Chloride 98 - 111 mmol/L 105   CO2 22 - 32 mmol/L 18   Calcium 8.9 - 10.3 mg/dL 7.5   Total Protein 6.5 - 8.1 g/dL 5.9   Total Bilirubin 0.3 - 1.2 mg/dL 0.6   Alkaline Phos 38 - 126 U/L 68   AST 15 - 41 U/L 18   ALT 0 - 44 U/L 7    Edinburgh Score:    07/29/2022    3:37 PM  Edinburgh Postnatal Depression Scale Screening Tool  I have been able to laugh and see the funny side of things. 0  I have looked forward with enjoyment to things. 0  I have blamed myself unnecessarily when things went wrong. 0  I have been anxious or worried for no good reason. 3  I have felt scared or panicky for no good reason. 0  Things have been getting on top of me. 0  I have been so unhappy that I have had difficulty sleeping. 0  I have felt sad or miserable. 0  I have been so unhappy that I have been crying. 0  The thought of harming myself has occurred to me. 0  Edinburgh Postnatal Depression Scale Total 3      After visit meds:  Allergies as of 12/13/2022       Reactions   Iodine Other (See Comments)   blistering   Sulfonamide Derivatives Other (See Comments)   blistering        Medication List     STOP taking these medications     ACCRUFeR 30 MG Caps Generic drug: Ferric Maltol   aspirin 81 MG chewable tablet   metoCLOPramide 5 MG tablet Commonly known as: Reglan   sertraline 50 MG tablet Commonly known as: ZOLOFT       TAKE these medications    acetaminophen 500 MG tablet Commonly known as: TYLENOL Take 2 tablets (1,000 mg total) by mouth every 6 (six) hours.   albuterol 108 (90 Base) MCG/ACT inhaler Commonly known as: VENTOLIN HFA Inhale 2 puffs into the lungs every 6 (six)  hours as needed for wheezing or shortness of breath.   fluticasone 110 MCG/ACT inhaler Commonly known as: FLOVENT HFA INHALE 1 PUFF INTO THE LUNGS TWICE A DAY   ketorolac 30 MG/ML injection Commonly known as: TORADOL Inject 1 mL (30 mg total) into the vein every 6 (six) hours.   PRE-NATAL PO Take by mouth.         Discharge for transfer to [facility] for proximity to infant in stable condition Infant Feeding: Bottle Infant Disposition:NICU at [facility] Discharge instruction: per After Visit Summary and Postpartum booklet. Activity: Advance as tolerated. Pelvic rest for 6 weeks.  Diet: routine diet Anticipated Birth Control: BTL done PP Postpartum Appointment:6 weeks Additional Postpartum F/U: Incision check 1 week and BP check 1 week Future Appointments:No future appointments. Follow up Visit:      12/13/2022 Dominica Severin, CNM

## 2022-12-13 NOTE — Final Progress Note (Addendum)
Obstetric Postpartum/PostOperative Daily Progress Note Subjective:  32 y.o. [id number] post-operative day # 0 status post primary Classical Cesarean delivery.  She is ambulating, is tolerating po, is voiding spontaneously.  Her pain is well controlled on PO pain medications. Her lochia is less than menses. Transport en route to transfer to [facility] for proximity to infant.   Medications SCHEDULED MEDICATIONS   acetaminophen  1,000 mg Oral Q6H   ferrous sulfate  325 mg Oral Q breakfast   ketorolac  30 mg Intravenous Q6H   Followed by   Melene Muller ON 12/14/2022] ibuprofen  600 mg Oral Q6H   prenatal multivitamin  1 tablet Oral Q1200   [START ON 12/14/2022] senna-docusate  2 tablet Oral Daily   simethicone  80 mg Oral TID PC    MEDICATION INFUSIONS   oxytocin Stopped (12/13/22 0955)    PRN MEDICATIONS  albuterol, coconut oil, witch hazel-glycerin **AND** dibucaine, diphenhydrAMINE, fentaNYL (SUBLIMAZE) injection, menthol-cetylpyridinium, methylergonovine **OR** methylergonovine, morphine injection, oxyCODONE, simethicone, zolpidem    Objective:   Vitals:   12/13/22 0730 12/13/22 0935 12/13/22 1130 12/13/22 1155  BP: 119/87 129/89  126/82  Pulse: 90 71  79  Resp: 20 20  18   Temp: 97.7 F (36.5 C)   97.7 F (36.5 C)  TempSrc: Oral   Oral  SpO2:  95% 100%   Weight:      Height:        Current Vital Signs 24h Vital Sign Ranges  T 97.7 F (36.5 C) Temp  Avg: 97.6 F (36.4 C)  Min: 97.2 F (36.2 C)  Max: 98.1 F (36.7 C)  BP 126/82 BP  Min: 84/47  Max: 130/79  HR 79 Pulse  Avg: 85.2  Min: 31  Max: 114  RR 18 Resp  Avg: 19.2  Min: 14  Max: 24  SaO2 100 %   SpO2  Avg: 99.3 %  Min: 95 %  Max: 100 %       24 Hour I/O Current Shift I/O  Time Ins Outs 10/27 0701 - 10/28 0700 In: 2733 [I.V.:1100] Out: 4985 [Urine:200] 10/28 0701 - 10/28 1900 In: 240 [P.O.:240] Out: 500 [Urine:500]  General: NAD Pulmonary: no increased work of breathing Abdomen: non-distended,  non-tender, fundus firm at level of umbilicus Inc: Dressing dry/intact, dried blood noted to bottom of dressing, no active bleeding. Extremities: no edema, no erythema, no tenderness  Labs:  Recent Labs  Lab 12/12/22 1907 12/12/22 2319 12/13/22 0535  WBC 19.9* 29.9* 26.5*  HGB 8.6* 9.7* 9.1*  HCT 23.8* 27.6* 26.0*  PLT 260 218 143*     Assessment:   32 y.o. [id number] postoperative day # 0 status post primary cesarean section  Plan:  1) Acute blood loss anemia - hemodynamically stable and asymptomatic - received multiple units of PRBCs due to antepartum hemorrhage  2) O POS / Rubella 1.01 (08/14 1103)/ Varicella Immune  3) TDAP status: did not receive prenatally  4) Contraception = bilateral tubal ligation  5) Disposition  Dominica Severin, CNM 12/13/22 3:36 PM

## 2022-12-13 NOTE — Anesthesia Postprocedure Evaluation (Signed)
Anesthesia Post Note  Patient: Brittany [name]  Procedure(s) Performed: CESAREAN SECTION  Patient location during evaluation: L&D Anesthesia Type: General Level of consciousness: awake and alert Pain management: pain level controlled Vital Signs Assessment: post-procedure vital signs reviewed and stable Respiratory status: spontaneous breathing, nonlabored ventilation, respiratory function stable and patient connected to nasal cannula oxygen Cardiovascular status: blood pressure returned to baseline and stable Postop Assessment: no apparent nausea or vomiting Anesthetic complications: no   No notable events documented.   Last Vitals:  Vitals:   12/13/22 0541 12/13/22 0620  BP: 124/89 130/79  Pulse: 69 71  Resp:    Temp:    SpO2:      Last Pain:  Vitals:   12/13/22 0541  TempSrc:   PainSc: 5                  Cleda Mccreedy Brittany [name]

## 2022-12-13 NOTE — Progress Notes (Addendum)
Postpartum Day # 0: Cesarean Delivery (urgent), for maternal hemorrhage (3260 ml) secondary to complete placenta previa of Twin B, along with IUGR and anhydramnios.  Twin A transferred to [facility] due to significant prematurity (23.6 weeks). Also with grand multiparity, obesity, marijuana use, and pre-existing HTN (no meds) this pregnancy.    Patient has received blood products this admission: 4 units PRBCs, 1 unit FFP, 1 unit cryoprecipitate.  Subjective: Patient reports incisional pain and tolerating PO.  Has not ambulated or voided as of yet.  Bleeding is normal.   Objective: Vital signs in last 24 hours: Temp:  [97.2 F (36.2 C)-98.1 F (36.7 C)] 97.6 F (36.4 C) (10/27 2302) Pulse Rate:  [31-114] 73 (10/28 0641) Resp:  [14-24] 24 (10/27 2302) BP: (84-130)/(47-91) 113/90 (10/28 0641) SpO2:  [100 %] 100 % (10/27 2301) Weight:  [111.6 kg] 111.6 kg (10/27 2204)  Physical Exam:  General: alert and no distress Lungs: clear to auscultation bilaterally Breasts: normal appearance, no masses or tenderness Heart: regular rate and rhythm, S1, S2 normal, no murmur, click, rub or gallop Abdomen: soft, non-tender; bowel sounds normal; no masses,  no organomegaly Pelvis: Lochia appropriate, Uterine Fundus firm, Incision: bandage with small amount of old dry blood at lower middle section.  Extremities: DVT Evaluation: No evidence of DVT seen on physical exam. Negative Homan's  sign. No cords or calf tenderness. No significant calf/ankle edema.     Latest Ref Rng & Units 12/13/2022    5:35 AM 12/12/2022   11:19 PM 12/12/2022    7:07 PM  CBC  WBC 4.0 - 10.5 K/uL 26.5  29.9  19.9   Hemoglobin 12.0 - 15.0 g/dL 9.1  9.7  8.6   Hematocrit 36.0 - 46.0 % 26.0  27.6  23.8   Platelets 150 - 400 K/uL 143  218  260         Latest Ref Rng & Units 12/12/2022    8:41 PM  CMP  Glucose 70 - 99 mg/dL 811   BUN 6 - 20 mg/dL <5   Creatinine 9.14 - 1.00 mg/dL 7.82   Sodium 956 - 213 mmol/L 132   Potassium  3.5 - 5.1 mmol/L 3.1   Chloride 98 - 111 mmol/L 105   CO2 22 - 32 mmol/L 18   Calcium 8.9 - 10.3 mg/dL 7.5   Total Protein 6.5 - 8.1 g/dL 5.9   Total Bilirubin 0.3 - 1.2 mg/dL 0.6   Alkaline Phos 38 - 126 U/L 68   AST 15 - 41 U/L 18   ALT 0 - 44 U/L 7         Latest Reference Range & Units 12/12/22 23:19  Fibrinogen 210 - 475 mg/dL 086  Prothrombin Time 57.8 - 15.2 seconds 15.6 (H)  INR 0.8 - 1.2  1.2  APTT 24 - 36 seconds 27  (H): Data is abnormally high     Assessment/Plan: Status post Cesarean section. Doing well postoperatively.  Contraception tubal ligation  Will formula feed.  Advance diet as tolerated Continue PO pain management Remove foley catheter and allow for ambulation. Discontinue IVF. Continue current care.  Plan for transfer to [facility] later today to be with surviving infant. Plans for cremation for deceased twin.  Offered ancillary services, chaplain, social work/psychiatry as patient at high risk of PP depression.  Will supplement potassium PO.     Hildred Laser, MD  OB/GYN at [facility]

## 2022-12-13 NOTE — Anesthesia Procedure Notes (Signed)
Procedure Name: Intubation Date/Time: 12/13/2022 12:15 AM  Performed by: Jaye Beagle, CRNAPre-anesthesia Checklist: Patient identified, Emergency Drugs available, Suction available and Patient being monitored Patient Re-evaluated:Patient Re-evaluated prior to induction Oxygen Delivery Method: Circle system utilized Preoxygenation: Pre-oxygenation with 100% oxygen Induction Type: IV induction and Rapid sequence Laryngoscope Size: Glidescope and 3 Grade View: Grade I Tube type: Oral Tube size: 6.5 mm Number of attempts: 1 Airway Equipment and Method: Stylet and Oral airway Placement Confirmation: ETT inserted through vocal cords under direct vision, positive ETCO2 and breath sounds checked- equal and bilateral Secured at: 23 cm Tube secured with: Tape Dental Injury: Teeth and Oropharynx as per pre-operative assessment

## 2022-12-13 NOTE — Progress Notes (Signed)
   12/13/22 1400  Spiritual Encounters  Type of Visit Initial  Care provided to: Pt and family  Referral source Nurse (RN/NT/LPN)  Reason for visit Grief/loss  OnCall Visit No  Interventions  Spiritual Care Interventions Made Established relationship of care and support;Compassionate presence;Reflective listening;Encouragement;Supported grief process  Intervention Outcomes  Outcomes Awareness of support   Chaplain spiritual/emotional support services remain available as the need arises.

## 2022-12-13 NOTE — Transfer of Care (Signed)
Immediate Anesthesia Transfer of Care Note  Patient: Brittany [name]  Procedure(s) Performed: CESAREAN SECTION  Patient Location:   Anesthesia Type:  Level of Consciousness:   Airway & Oxygen Therapy:   Post-op Assessment:   Post vital signs:   Last Vitals:  Vitals Value Taken Time  BP    Temp    Pulse    Resp    SpO2      Last Pain:  Vitals:   12/12/22 2301  TempSrc: Axillary  PainSc:          Complications: No notable events documented.

## 2022-12-13 NOTE — Progress Notes (Addendum)
Transfer to [facility] for proximity to infant initiated. Accepting physician: Dr. Cline Crock. Room assignment: 42, Women's Specialty Unit.

## 2022-12-14 ENCOUNTER — Encounter: Payer: Self-pay | Admitting: Obstetrics and Gynecology

## 2022-12-14 ENCOUNTER — Other Ambulatory Visit: Payer: Self-pay | Admitting: Obstetrics and Gynecology

## 2022-12-14 DIAGNOSIS — O365929 Maternal care for other known or suspected poor fetal growth, second trimester, other fetus: Secondary | ICD-10-CM

## 2022-12-14 DIAGNOSIS — O30049 Twin pregnancy, dichorionic/diamniotic, unspecified trimester: Secondary | ICD-10-CM

## 2022-12-14 DIAGNOSIS — O10912 Unspecified pre-existing hypertension complicating pregnancy, second trimester: Secondary | ICD-10-CM

## 2022-12-14 DIAGNOSIS — O4402 Placenta previa specified as without hemorrhage, second trimester: Secondary | ICD-10-CM

## 2022-12-14 LAB — BPAM FFP
Blood Product Expiration Date: 202411012359
Blood Product Expiration Date: 202411012359
ISSUE DATE / TIME: 202410272347
Unit Type and Rh: 5100
Unit Type and Rh: 5100

## 2022-12-14 LAB — PREPARE FRESH FROZEN PLASMA

## 2022-12-14 LAB — PREPARE RBC (CROSSMATCH)

## 2022-12-14 LAB — SURGICAL PATHOLOGY

## 2022-12-15 ENCOUNTER — Encounter: Payer: Self-pay | Admitting: Obstetrics and Gynecology

## 2022-12-20 LAB — SURGICAL PATHOLOGY

## 2022-12-21 NOTE — Progress Notes (Unsigned)
    OBSTETRICS/GYNECOLOGY POST-OPERATIVE CLINIC VISIT  Subjective:     Brittany [name] is a 32 y.o. female who presents to the clinic 1 weeks status post  Cesarean Section  for {gyn surg indications maj:13998}. Eating a regular diet {with-without:5700} difficulty. Bowel movements are {normal/abnormal***:19619}. {pain control:13522::"The patient is not having any pain."}  {Common ambulatory SmartLinks:19316}  Review of Systems {ros; complete:30496}   Objective:   LMP 06/21/2022 (Approximate)  There is no height or weight on file to calculate BMI.  General:  alert and no distress  Abdomen: soft, bowel sounds active, non-tender  Incision:   {incision:13716::"no dehiscence","incision well approximated","healing well","no drainage","no erythema","no hernia","no seroma","no swelling"}    Pathology:    Assessment:   Patient s/p Cesarean Section (surgery)  {doing well:13525::"Doing well postoperatively."}   Plan:   1. Continue any current medications as instructed by provider. 2. Wound care discussed. 3. Operative findings again reviewed. Pathology report discussed. 4. Activity restrictions: {restrictions:13723} 5. Anticipated return to work: {work return:14002}. 6. Follow up: {1-61:09604} {time; units:18646} for ***    Hildred Laser, MD [facility]

## 2022-12-22 ENCOUNTER — Encounter: Payer: Self-pay | Admitting: Obstetrics and Gynecology

## 2022-12-22 ENCOUNTER — Ambulatory Visit (INDEPENDENT_AMBULATORY_CARE_PROVIDER_SITE_OTHER): Payer: Medicaid Other | Admitting: Obstetrics and Gynecology

## 2022-12-22 DIAGNOSIS — K649 Unspecified hemorrhoids: Secondary | ICD-10-CM

## 2022-12-22 DIAGNOSIS — L7682 Other postprocedural complications of skin and subcutaneous tissue: Secondary | ICD-10-CM

## 2022-12-22 DIAGNOSIS — O364XX0 Maternal care for intrauterine death, not applicable or unspecified: Secondary | ICD-10-CM

## 2022-12-22 DIAGNOSIS — F53 Postpartum depression: Secondary | ICD-10-CM | POA: Diagnosis not present

## 2022-12-22 MED ORDER — OXYCODONE HCL 5 MG PO TABS: 5.0000 mg | ORAL_TABLET | Freq: Four times a day (QID) | ORAL | 0 refills | Status: AC | PRN

## 2022-12-22 MED ORDER — HYDROCORTISONE ACETATE 25 MG RE SUPP: 25.0000 mg | Freq: Two times a day (BID) | RECTAL | 1 refills | Status: DC

## 2022-12-22 MED ORDER — LIDOCAINE 4 % EX PTCH: 1.0000 | MEDICATED_PATCH | CUTANEOUS | 0 refills | Status: AC

## 2022-12-29 ENCOUNTER — Encounter: Payer: Self-pay | Admitting: Obstetrics and Gynecology

## 2022-12-29 ENCOUNTER — Ambulatory Visit (INDEPENDENT_AMBULATORY_CARE_PROVIDER_SITE_OTHER): Payer: Medicaid Other | Admitting: Obstetrics and Gynecology

## 2022-12-29 VITALS — Resp 16 | Ht 64.0 in | Wt 229.0 lb

## 2022-12-29 DIAGNOSIS — Z1332 Encounter for screening for maternal depression: Secondary | ICD-10-CM

## 2022-12-29 DIAGNOSIS — F53 Postpartum depression: Secondary | ICD-10-CM

## 2022-12-29 NOTE — Progress Notes (Signed)
Virtual Visit via Telephone Note  I connected with Brittany [name]  on 12/29/22 at  11:15 AM EST by a video enabled telemedicine application and verified that I am speaking with the correct person using two identifiers.  Location: Patient: Hospital Provider: Office   I discussed the limitations of evaluation and management by telemedicine and the availability of in person appointments. The patient expressed understanding and agreed to proceed.    History of Present Illness:   Brittany [name] is a 32 y.o. [phone] female who presents for a 2 week televisit for mood check. She is 2 weeks postpartum following an urgent cesarean delivery at 23.6 weeks for hemorrhage with placenta previa requiring blood transfusion, resulting in fetal demise of Twin B ([place]) and premature delivery of Twin A who is currently in NICU a [facility].  Patient notes she is feeling a little better than last week as she was able to hold her baby today.  Bleeding: none. Postpartum depression screening: positive.  EDPS score is 19 ((previously 24). Is taking Zoloft 100 mg daily. Has planned for arrangements for Twin B.      The following portions of the patient's history were reviewed and updated as appropriate: allergies, current medications, past family history, past medical history, past social history, past surgical history, and problem list.   Observations/Objective:   Resp. rate 16, height 5\' 4"  (1.626 m), weight 229 lb (103.9 kg), currently breastfeeding. Gen App: NAD Psych: normal speech, affect. Good mood.        12/29/2022   11:08 AM 12/22/2022   10:40 AM 12/13/2022    7:01 AM 07/29/2022    3:37 PM 04/08/2021    1:50 PM  Edinburgh Postnatal Depression Scale Screening Tool  I have been able to laugh and see the funny side of things. 2 2 1  0 0  I have looked forward with enjoyment to things. 3 3 0 0 0  I have blamed myself unnecessarily when things went wrong. 1 2 2  0 3  I have been  anxious or worried for no good reason. 2 3 2 3 2   I have felt scared or panicky for no good reason. 2 3 0 0 1  Things have been getting on top of me. 2 3 2  0 1  I have been so unhappy that I have had difficulty sleeping. 2 2 2  0 1  I have felt sad or miserable. 1 3 2  0 1  I have been so unhappy that I have been crying. 2 3 1  0 0  The thought of harming myself has occurred to me. 2 0 0 0 0  Edinburgh Postnatal Depression Scale Total 19 24 12 3 9          Assessment and Plan:   1. Postpartum depression - Screening remains positive today, however has improved some since last week. Continue Zoloft as prescribed, also plans to speak with a counselor at [facility] while her baby is in the NICU. Will rescreen at 6 week postpartum visit.     2. Postpartum state - Overall doing well. Continue routine postpartum home care. Is making plans for cremation for [place].    Telephone call was disconnected prior to completion of the visit. Attempted to contact again by phone several times but received voicemail. Sent Mychart message to have patient call the office again to follow up.    Follow Up Instructions:     I discussed the assessment and treatment plan with  the patient. The patient was provided an opportunity to ask questions and all were answered. The patient agreed with the plan and demonstrated an understanding of the instructions.   The patient was advised to call back or seek an in-person evaluation if the symptoms worsen or if the condition fails to improve as anticipated.   I provided 6 minutes of non-face-to-face time during this encounter.     Hildred Laser, MD [facility]

## 2023-01-11 ENCOUNTER — Telehealth: Payer: Self-pay

## 2023-01-11 NOTE — Telephone Encounter (Signed)
I called Brittany [name] trying to get a return to work date. And her 6 week pp scheduled

## 2023-01-11 NOTE — Telephone Encounter (Signed)
The patient is returning missed call. Per pt return to work date, 01/20/23

## 2023-01-12 DIAGNOSIS — Z0289 Encounter for other administrative examinations: Secondary | ICD-10-CM

## 2023-01-25 ENCOUNTER — Telehealth: Payer: Self-pay | Admitting: Obstetrics and Gynecology

## 2023-01-25 ENCOUNTER — Ambulatory Visit: Payer: Medicaid Other | Admitting: Obstetrics and Gynecology

## 2023-01-25 NOTE — Patient Instructions (Incomplete)

## 2023-01-25 NOTE — Progress Notes (Unsigned)
   OBSTETRICS POSTPARTUM CLINIC PROGRESS NOTE  Subjective:     Brittany [name] is a 32 y.o. [phone] female who presents for a postpartum visit. She is 6 weeks postpartum following a {delivery:12449}. I have fully reviewed the prenatal and intrapartum course. The delivery was at *** gestational weeks.  Anesthesia: {anesthesia types:812}. Postpartum course has been ***. Baby's course has been ***. Baby is feeding by {breast/bottle:69}. Bleeding: patient {HAS HAS XBM:84132} not resumed menses, with No LMP recorded.. Bowel function is {normal:32111}. Bladder function is {normal:32111}. Patient {is/is not:9024} sexually active. Contraception method desired is {contraceptive method:5051}. Postpartum depression screening: {neg default:13464::"negative"}.  EDPS score is ***.    The following portions of the patient's history were reviewed and updated as appropriate: allergies, current medications, past family history, past medical history, past social history, past surgical history, and problem list.  Review of Systems {ros; complete:30496}   Objective:    There were no vitals taken for this visit.  General:  alert and no distress   Breasts:  inspection negative, no nipple discharge or bleeding, no masses or nodularity palpable  Lungs: clear to auscultation bilaterally  Heart:  regular rate and rhythm, S1, S2 normal, no murmur, click, rub or gallop  Abdomen: soft, non-tender; bowel sounds normal; no masses,  no organomegaly.  ***Well healed Pfannenstiel incision   Vulva:  normal  Vagina: normal vagina, no discharge, exudate, lesion, or erythema  Cervix:  no cervical motion tenderness and no lesions  Corpus: normal size, contour, position, consistency, mobility, non-tender  Adnexa:  normal adnexa and no mass, fullness, tenderness  Rectal Exam: Not performed.         Labs:  Lab Results  Component Value Date   HGB 9.1 (L) 12/13/2022     Assessment:   No diagnosis found.   Plan:     1. Contraception: {method:5051} 2. Will check Hgb for h/o postpartum anemia of less than 10.  3. Follow up in: {1-10:13787} {time; units:19136} or as needed.    Tommie Raymond, CMA [facility]

## 2023-01-25 NOTE — Telephone Encounter (Signed)
Pt called in about her 5 week pp visit that was scheduled on 01/25/2023 at 3:55 with Dr. Valentino Saxon.  Pt rescheduled to 01/27/23 at 1:55 with Dr. Valentino Saxon.

## 2023-01-26 ENCOUNTER — Ambulatory Visit: Payer: Medicaid Other | Admitting: Obstetrics and Gynecology

## 2023-01-27 ENCOUNTER — Ambulatory Visit: Payer: Medicaid Other | Admitting: Obstetrics and Gynecology

## 2023-01-27 ENCOUNTER — Encounter: Payer: Self-pay | Admitting: Obstetrics and Gynecology

## 2023-01-27 DIAGNOSIS — D649 Anemia, unspecified: Secondary | ICD-10-CM | POA: Diagnosis not present

## 2023-01-27 DIAGNOSIS — I10 Essential (primary) hypertension: Secondary | ICD-10-CM

## 2023-01-27 DIAGNOSIS — O9081 Anemia of the puerperium: Secondary | ICD-10-CM | POA: Diagnosis not present

## 2023-01-27 DIAGNOSIS — F53 Postpartum depression: Secondary | ICD-10-CM

## 2023-01-27 DIAGNOSIS — Z8759 Personal history of other complications of pregnancy, childbirth and the puerperium: Secondary | ICD-10-CM

## 2023-01-27 DIAGNOSIS — O4410 Placenta previa with hemorrhage, unspecified trimester: Secondary | ICD-10-CM

## 2023-01-27 LAB — POCT HEMOGLOBIN: Hemoglobin: 11.5 g/dL (ref 11–14.6)

## 2023-01-27 NOTE — Patient Instructions (Signed)

## 2023-01-27 NOTE — Progress Notes (Signed)
OBSTETRICS POSTPARTUM CLINIC PROGRESS NOTE  Subjective:     Brittany [name] is a 32 y.o. [phone] female who presents for a postpartum visit. She is 6 weeks postpartum following a urgent cesarean section. I have fully reviewed the prenatal and intrapartum course. The delivery was at 23.6 gestational weeks.  Anesthesia: general. Postpartum course has been so/so. Baby's course has been thriving, weight is up to almost 3 lbs. Baby is feeding by breast by donor milk. Bleeding: patient has resumed menses, with No LMP recorded.. Bowel function is normal. Bladder function is normal. Patient is sexually active. Contraception method desired is tubal ligation. Postpartum depression screening: negative.  EDPS score is 6. Taking Zoloft 100 mg daily. Notes compliance with her medications.    The following portions of the patient's history were reviewed and updated as appropriate: allergies, current medications, past family history, past medical history, past social history, past surgical history, and problem list.  Review of Systems Pertinent items noted in HPI and remainder of comprehensive ROS otherwise negative.   Objective:    BP 120/89   Pulse 69   Resp 16   Ht 5\' 4"  (1.626 m)   Wt 236 lb 11.2 oz (107.4 kg)   LMP 01/17/2023   Breastfeeding No   BMI 40.63 kg/m   General:  alert and no distress   Breasts:  inspection negative, no nipple discharge or bleeding, no masses or nodularity palpable  Lungs: clear to auscultation bilaterally  Heart:  regular rate and rhythm, S1, S2 normal, no murmur, click, rub or gallop  Abdomen: soft, non-tender; bowel sounds normal; no masses,  no organomegaly.  Well healed Pfannenstiel incision   Pelvis:  deferred  Extremities:  Normal, no edema or tenderness  Neurologic :  Grossly normal.              01/27/2023    2:20 PM 12/29/2022   11:08 AM 12/22/2022   10:40 AM 12/13/2022    7:01 AM 07/29/2022    3:37 PM  Edinburgh Postnatal Depression Scale  Screening Tool  I have been able to laugh and see the funny side of things. 0 2 2 1  0  I have looked forward with enjoyment to things. 0 3 3 0 0  I have blamed myself unnecessarily when things went wrong. 1 1 2 2  0  I have been anxious or worried for no good reason. 2 2 3 2 3   I have felt scared or panicky for no good reason. 1 2 3  0 0  Things have been getting on top of me. 1 2 3 2  0  I have been so unhappy that I have had difficulty sleeping. 0 2 2 2  0  I have felt sad or miserable. 0 1 3 2  0  I have been so unhappy that I have been crying. 1 2 3 1  0  The thought of harming myself has occurred to me. 0 2 0 0 0  Edinburgh Postnatal Depression Scale Total 6 19 24 12 3      Labs:  Lab Results  Component Value Date   HGB 9.1 (L) 12/13/2022     Assessment:   1. Postpartum care following cesarean delivery   2. Antepartum hemorrhage from placenta previa   3. Postpartum depression   4. Essential hypertension   5. History of fetal demise, not currently pregnant   6. Postpartum anemia      Plan:   1. Contraception: tubal ligation 2. Will check Hgb for h/o  postpartum anemia of less than 10.  3. Continue Zoloft for depression, overall mood symptoms appear to be improved.  Notes that her surviving baby is overall doing well in the NICU at [facility]. Predicted to discharge home in the next 2-3 months.  4. Essential HTN, doing well since completion of pregnancy, no meds currently.  4. Follow up in:  4-6  months for annual exam.    Hildred Laser, MD [facility]

## 2023-03-05 ENCOUNTER — Other Ambulatory Visit: Payer: Self-pay | Admitting: Certified Nurse Midwife

## 2023-04-02 ENCOUNTER — Other Ambulatory Visit: Payer: Self-pay | Admitting: Certified Nurse Midwife

## 2023-07-06 ENCOUNTER — Other Ambulatory Visit: Payer: Self-pay | Admitting: Certified Nurse Midwife

## 2023-07-11 ENCOUNTER — Telehealth: Admitting: Family Medicine

## 2023-07-11 DIAGNOSIS — J45901 Unspecified asthma with (acute) exacerbation: Secondary | ICD-10-CM

## 2023-07-11 MED ORDER — ALBUTEROL SULFATE HFA 108 (90 BASE) MCG/ACT IN AERS: 2.0000 | INHALATION_SPRAY | Freq: Four times a day (QID) | RESPIRATORY_TRACT | 0 refills | Status: DC | PRN

## 2023-07-11 MED ORDER — FLUTICASONE PROPIONATE HFA 110 MCG/ACT IN AERO: 2.0000 | INHALATION_SPRAY | Freq: Two times a day (BID) | RESPIRATORY_TRACT | 0 refills | Status: DC

## 2023-07-11 MED ORDER — GUAIFENESIN 200 MG PO TABS: 200.0000 mg | ORAL_TABLET | ORAL | 0 refills | Status: AC | PRN

## 2023-07-11 MED ORDER — PREDNISONE 10 MG (21) PO TBPK: ORAL_TABLET | ORAL | 0 refills | Status: AC

## 2023-07-11 NOTE — Patient Instructions (Signed)
 Brittany [name], thank you for joining Brittany Roys, PA-C for today's virtual visit.  While this provider is not your primary care provider (PCP), if your PCP is located in our provider database this encounter information will be shared with them immediately following your visit.   A [facility] MyChart account gives you access to today's visit and all your visits, tests, and labs performed at [facility] " click here if you don't have a [facility] MyChart account or go to [email or web address]  Consent: (Patient) Brittany [name] provided verbal consent for this virtual visit at the beginning of the encounter.  Current Medications:  Current Outpatient Medications:    albuterol  (VENTOLIN  HFA) 108 (90 Base) MCG/ACT inhaler, Inhale 2 puffs into the lungs every 6 (six) hours as needed for wheezing or shortness of breath., Disp: 8.5 g, Rfl: 0   fluticasone  (FLOVENT  HFA) 110 MCG/ACT inhaler, Inhale 2 puffs into the lungs in the morning and at bedtime., Disp: 1 each, Rfl: 0   guaiFENesin 200 MG tablet, Take 1 tablet (200 mg total) by mouth every 4 (four) hours as needed for up to 7 days for cough or to loosen phlegm., Disp: 30 tablet, Rfl: 0   predniSONE  (STERAPRED UNI-PAK 21 TAB) 10 MG (21) TBPK tablet, Take following package directions., Disp: 21 tablet, Rfl: 0   acetaminophen  (TYLENOL ) 500 MG tablet, Take 2 tablets (1,000 mg total) by mouth every 6 (six) hours., Disp: 30 tablet, Rfl: 0   ferrous sulfate  325 (65 FE) MG tablet, Take 325 mg by mouth daily with breakfast., Disp: , Rfl:    lidocaine  (LIDOCAINE  PAIN RELIEF MAX ST) 4 %, Place 1 patch onto the skin daily., Disp: 12 patch, Rfl: 0   oxyCODONE  (OXY IR/ROXICODONE ) 5 MG immediate release tablet, Take 1-2 tablets (5-10 mg total) by mouth every 6 (six) hours as needed for severe pain (pain score 7-10) or moderate pain (pain score 4-6)., Disp: 20 tablet, Rfl: 0   sertraline  (ZOLOFT ) 100 MG tablet, Take 100 mg by mouth  daily., Disp: , Rfl:    VENTOLIN  HFA 108 (90 Base) MCG/ACT inhaler, TAKE 2 PUFFS BY MOUTH EVERY 6 HOURS AS NEEDED FOR WHEEZE OR SHORTNESS OF BREATH, Disp: 36 each, Rfl: 2   Medications ordered in this encounter:  Meds ordered this encounter  Medications   predniSONE  (STERAPRED UNI-PAK 21 TAB) 10 MG (21) TBPK tablet    Sig: Take following package directions.    Dispense:  21 tablet    Refill:  0    Please dispense one standard blister pack taper.   guaiFENesin 200 MG tablet    Sig: Take 1 tablet (200 mg total) by mouth every 4 (four) hours as needed for up to 7 days for cough or to loosen phlegm.    Dispense:  30 tablet    Refill:  0   fluticasone  (FLOVENT  HFA) 110 MCG/ACT inhaler    Sig: Inhale 2 puffs into the lungs in the morning and at bedtime.    Dispense:  1 each    Refill:  0   albuterol  (VENTOLIN  HFA) 108 (90 Base) MCG/ACT inhaler    Sig: Inhale 2 puffs into the lungs every 6 (six) hours as needed for wheezing or shortness of breath.    Dispense:  8.5 g    Refill:  0     *If you need refills on other medications prior to your next appointment, please contact your pharmacy*  Follow-Up: Call back or seek an in-person evaluation  if the symptoms worsen or if the condition fails to improve as anticipated.  [facility] [phone]  Other Instructions Asthma, Adult  Asthma is a long-term (chronic) condition that causes recurrent episodes in which the lower airways in the lungs become tight and narrow. The narrowing is caused by inflammation and tightening of the smooth muscle around the lower airways. Asthma episodes, also called asthma attacks or asthma flares, may cause coughing, making high-pitched whistling sounds when you breathe, most often when you breathe out (wheezing), shortness of breath, and chest pain. The airways may produce extra mucus caused by the inflammation and irritation. During an attack, it can be difficult to breathe. Asthma attacks can  range from minor to life-threatening. Asthma cannot be cured, but medicines and lifestyle changes can help control it and treat acute attacks. It is important to keep your asthma well controlled so the condition does not interfere with your daily life. What are the causes? This condition is believed to be caused by inherited (genetic) and environmental factors, but its exact cause is not known. What can trigger an asthma attack? Many things can bring on an asthma attack or make symptoms worse. These triggers are different for every person. Common triggers include: Allergens and irritants like mold, dust, pet dander, cockroaches, pollen, air pollution, and chemical odors. Cigarette smoke. Weather changes and cold air. Stress and strong emotional responses such as crying or laughing hard. Certain medications such as aspirin  or beta blockers. Infections and inflammatory conditions, such as the flu, a cold, pneumonia, or inflammation of the nasal membranes (rhinitis). Gastroesophageal reflux disease (GERD). What are the signs or symptoms? Symptoms may occur right after exposure to an asthma trigger or hours later and can vary by person. Common signs and symptoms include: Wheezing. Trouble breathing (shortness of breath). Excessive nighttime or early morning coughing. Chest tightness. Tiredness (fatigue) with minimal activity. Difficulty talking in complete sentences. Poor exercise tolerance. How is this diagnosed? This condition is diagnosed based on: A physical exam and your medical history. Tests, which may include: Lung function studies to evaluate the flow of air in your lungs. Allergy tests. Imaging tests, such as X-rays. How is this treated? There is no cure, but symptoms can be controlled with proper treatment. Treatment usually involves: Identifying and avoiding your asthma triggers. Inhaled medicines. Two types are commonly used to treat asthma, depending on severity: Controller  medicines. These help prevent asthma symptoms from occurring. They are taken every day. Fast-acting reliever or rescue medicines. These quickly relieve asthma symptoms. They are used as needed and provide short-term relief. Using other medicines, such as: Allergy medicines, such as antihistamines, if your asthma attacks are triggered by allergens. Immune medicines (immunomodulators). These are medicines that help control the immune system. Using supplemental oxygen . This is only needed during a severe episode. Creating an asthma action plan. An asthma action plan is a written plan for managing and treating your asthma attacks. This plan includes: A list of your asthma triggers and how to avoid them. Information about when medicines should be taken and when their dosage should be changed. Instructions about using a device called a peak flow meter. A peak flow meter measures how well the lungs are working and the severity of your asthma. It helps you monitor your condition. Follow these instructions at home: Take over-the-counter and prescription medicines only as told by your health care provider. Stay up to date on all vaccinations as recommended by your healthcare provider, including vaccines  for the flu and pneumonia. Use a peak flow meter and keep track of your peak flow readings. Understand and use your asthma action plan to address any asthma flares. Do not smoke or allow anyone to smoke in your home. Contact a health care provider if: You have wheezing, shortness of breath, or a cough that is not responding to medicines. Your medicines are causing side effects, such as a rash, itching, swelling, or trouble breathing. You need to use a reliever medicine more than 2-3 times a week. Your peak flow reading is still at 50-79% of your personal best after following your action plan for 1 hour. You have a fever and shortness of breath. Get help right away if: You are getting worse and do not  respond to treatment during an asthma attack. You are short of breath when at rest or when doing very little physical activity. You have difficulty eating, drinking, or talking. You have chest pain or tightness. You develop a fast heartbeat or palpitations. You have a bluish color to your lips or fingernails. You are light-headed or dizzy, or you faint. Your peak flow reading is less than 50% of your personal best. You feel too tired to breathe normally. These symptoms may be an emergency. Get help right away. Call 911. Do not wait to see if the symptoms will go away. Do not drive yourself to the hospital. Summary Asthma is a long-term (chronic) condition that causes recurrent episodes in which the airways become tight and narrow. Asthma episodes, also called asthma attacks or asthma flares, can cause coughing, wheezing, shortness of breath, and chest pain. Asthma cannot be cured, but medicines and lifestyle changes can help keep it well controlled and prevent asthma flares. Make sure you understand how to avoid triggers and how and when to use your medicines. Asthma attacks can range from minor to life-threatening. Get help right away if you have an asthma attack and do not respond to treatment with your usual rescue medicines. This information is not intended to replace advice given to you by your health care provider. Make sure you discuss any questions you have with your health care provider. Document Revised: 11/19/2020 Document Reviewed: 11/10/2020 Elsevier Patient Education  2024 [employer]vier [employer]   If you have been instructed to have an in-person evaluation today at a local Urgent Care facility, please use the link below. It will take you to a list of all of our available [facility], including address, phone number and hours of operation. Please do not delay care.  [facility]  If you or a family member do not have a primary care provider, use the link below  to schedule a visit and establish care. When you choose a [facility] primary care physician or advanced practice provider, you gain a long-term partner in health. Find a Primary Care Provider  Learn more about [facility]'s in-office and virtual care options: [facility] - Get Care Now

## 2023-07-11 NOTE — Progress Notes (Signed)
 Virtual Visit Consent   PRINCELLA [name], you are scheduled for a virtual visit with a [facility] provider today. Just as with appointments in the office, your consent must be obtained to participate. Your consent will be active for this visit and any virtual visit you may have with one of our providers in the next 365 days. If you have a MyChart account, a copy of this consent can be sent to you electronically.  As this is a virtual visit, video technology does not allow for your provider to perform a traditional examination. This may limit your provider's ability to fully assess your condition. If your provider identifies any concerns that need to be evaluated in person or the need to arrange testing (such as labs, EKG, etc.), we will make arrangements to do so. Although advances in technology are sophisticated, we cannot ensure that it will always work on either your end or our end. If the connection with a video visit is poor, the visit may have to be switched to a telephone visit. With either a video or telephone visit, we are not always able to ensure that we have a secure connection.  By engaging in this virtual visit, you consent to the provision of healthcare and authorize for your insurance to be billed (if applicable) for the services provided during this visit. Depending on your insurance coverage, you may receive a charge related to this service.  I need to obtain your verbal consent now. Are you willing to proceed with your visit today? Brittany [name] has provided verbal consent on 07/11/2023 for a virtual visit (video or telephone). Brittany [name], [place]  Date: 07/11/2023 9:26 AM   Virtual Visit via Video Note   ILouvenia [name], connected with  Brittany [name]  ([id number], 26-Feb-1990) on 07/11/23 at  9:15 AM EDT by a video-enabled telemedicine application and verified that I am speaking with the correct person using two identifiers.  Location: Patient: Virtual Visit  Location Patient: Home Provider: Virtual Visit Location Provider: Home Office   I discussed the limitations of evaluation and management by telemedicine and the availability of in person appointments. The patient expressed understanding and agreed to proceed.    History of Present Illness: Brittany [name] is a 33 y.o. who identifies as a female who was assigned female at birth, and is being seen today for c/o having issues with asthma.  Pt states does not have maintenance inhalers and no refills on inhalers.  Pt states she has used up her inhalers and has chest tightness. Pt states she was on Flovent  and Albuterol .  Pt states she is coughing up mucus and sniffles from what she thinks are allergies. Pt states she also has wheezing. Pt denies pregnancy or concern for pregnancy.   HPI: HPI  Problems:  Patient Active Problem List   Diagnosis Date Noted   Marijuana use 12/13/2022   Pre-existing hypertension affecting pregnancy in second trimester 12/13/2022   History of depression 12/13/2022   Antepartum hemorrhage from placenta previa 12/12/2022   Tobacco smoker, 1 pack of cigarettes or less per day 11/20/2022   Asthma 02/25/2018    Allergies:  Allergies  Allergen Reactions   Iodine Other (See Comments)    blistering   Sulfonamide Derivatives Other (See Comments)    blistering   Medications:  Current Outpatient Medications:    albuterol  (VENTOLIN  HFA) 108 (90 Base) MCG/ACT inhaler, Inhale 2 puffs into the lungs every 6 (six) hours as needed for wheezing or  shortness of breath., Disp: 8.5 g, Rfl: 0   fluticasone  (FLOVENT  HFA) 110 MCG/ACT inhaler, Inhale 2 puffs into the lungs in the morning and at bedtime., Disp: 1 each, Rfl: 0   guaiFENesin 200 MG tablet, Take 1 tablet (200 mg total) by mouth every 4 (four) hours as needed for up to 7 days for cough or to loosen phlegm., Disp: 30 tablet, Rfl: 0   predniSONE  (STERAPRED UNI-PAK 21 TAB) 10 MG (21) TBPK tablet, Take following package  directions., Disp: 21 tablet, Rfl: 0   acetaminophen  (TYLENOL ) 500 MG tablet, Take 2 tablets (1,000 mg total) by mouth every 6 (six) hours., Disp: 30 tablet, Rfl: 0   ferrous sulfate  325 (65 FE) MG tablet, Take 325 mg by mouth daily with breakfast., Disp: , Rfl:    lidocaine  (LIDOCAINE  PAIN RELIEF MAX ST) 4 %, Place 1 patch onto the skin daily., Disp: 12 patch, Rfl: 0   oxyCODONE  (OXY IR/ROXICODONE ) 5 MG immediate release tablet, Take 1-2 tablets (5-10 mg total) by mouth every 6 (six) hours as needed for severe pain (pain score 7-10) or moderate pain (pain score 4-6)., Disp: 20 tablet, Rfl: 0   sertraline  (ZOLOFT ) 100 MG tablet, Take 100 mg by mouth daily., Disp: , Rfl:    VENTOLIN  HFA 108 (90 Base) MCG/ACT inhaler, TAKE 2 PUFFS BY MOUTH EVERY 6 HOURS AS NEEDED FOR WHEEZE OR SHORTNESS OF BREATH, Disp: 36 each, Rfl: 2  Observations/Objective: Patient is well-developed, well-nourished in no acute distress.  Resting comfortably at home.  Head is normocephalic, atraumatic.  No labored breathing.  Speech is clear and coherent with logical content.  Patient is alert and oriented at baseline.    Assessment and Plan: 1. Exacerbation of asthma, unspecified asthma severity, unspecified whether persistent (Primary) - predniSONE  (STERAPRED UNI-PAK 21 TAB) 10 MG (21) TBPK tablet; Take following package directions.  Dispense: 21 tablet; Refill: 0 - guaiFENesin 200 MG tablet; Take 1 tablet (200 mg total) by mouth every 4 (four) hours as needed for up to 7 days for cough or to loosen phlegm.  Dispense: 30 tablet; Refill: 0 - fluticasone  (FLOVENT  HFA) 110 MCG/ACT inhaler; Inhale 2 puffs into the lungs in the morning and at bedtime.  Dispense: 1 each; Refill: 0  -Advised Pt to follow up with PCP for follow up and further evaluation of symptoms  -If worsening symptoms, Pt to follow up with urgent care or emergency room.   Follow Up Instructions: I discussed the assessment and treatment plan with the patient.  The patient was provided an opportunity to ask questions and all were answered. The patient agreed with the plan and demonstrated an understanding of the instructions.  A copy of instructions were sent to the patient via MyChart unless otherwise noted below.    The patient was advised to call back or seek an in-person evaluation if the symptoms worsen or if the condition fails to improve as anticipated.    Brittany Roys, PA-C

## 2023-07-19 ENCOUNTER — Other Ambulatory Visit

## 2023-07-19 ENCOUNTER — Other Ambulatory Visit: Payer: Self-pay

## 2023-07-22 ENCOUNTER — Other Ambulatory Visit: Payer: Self-pay

## 2023-07-26 ENCOUNTER — Ambulatory Visit: Admitting: Podiatry

## 2023-08-09 ENCOUNTER — Ambulatory Visit: Admitting: Podiatry

## 2023-08-09 ENCOUNTER — Encounter: Payer: Self-pay | Admitting: Podiatry

## 2023-08-09 VITALS — Ht 64.0 in | Wt 236.7 lb

## 2023-08-09 DIAGNOSIS — L600 Ingrowing nail: Secondary | ICD-10-CM

## 2023-08-09 DIAGNOSIS — D2372 Other benign neoplasm of skin of left lower limb, including hip: Secondary | ICD-10-CM

## 2023-08-09 DIAGNOSIS — D492 Neoplasm of unspecified behavior of bone, soft tissue, and skin: Secondary | ICD-10-CM

## 2023-08-09 NOTE — Progress Notes (Signed)
   Chief Complaint  Patient presents with   Plantar Warts    Pt is here due to plantar wart on the bottom of both feet she states they have been there since high school and are very painful, also has a ingrown on the right great toenail that's been there for a while and is painful to touch.    Subjective: Patient presents today for evaluation of pain to the medial border right great toe. Patient is concerned for possible ingrown nail.  It is very sensitive to touch.    She has also developed pain and tenderness assisted to the plantar aspect of the left forefoot secondary to a symptomatic skin lesion.  Patient presents today for further treatment and evaluation.  Past Medical History:  Diagnosis Date   Antepartum hemorrhage from placenta previa 12/12/2022   Asthma    Chlamydia    Gonorrhea    History of postpartum hemorrhage 05/18/2019   Hx MRSA infection 02/16/2003   Trichimoniasis     Past Surgical History:  Procedure Laterality Date   CESAREAN SECTION  12/13/2022   Procedure: CESAREAN SECTION;  Surgeon: Connell Davies, MD;  Location: [facility] ORS;  Service: Obstetrics;;   MULTIPLE TOOTH EXTRACTIONS      Allergies  Allergen Reactions   Iodine Other (See Comments)    blistering   Sulfonamide Derivatives Other (See Comments)    blistering    Objective:  General: Well developed, nourished, in no acute distress, alert and oriented x3   Dermatology: Skin is warm, dry and supple bilateral.  Medial border right great toe is tender with evidence of an ingrowing nail. Pain on palpation noted to the border of the nail fold. The remaining nails appear unremarkable at this time.  Hyperkeratotic skin lesion noted to the plantar aspect of the left foot with a central nucleated core  Vascular: DP and PT pulses palpable.  No clinical evidence of vascular compromise  Neruologic: Grossly intact via light touch bilateral.  Musculoskeletal: No pedal deformity noted  Assesement: #1  Paronychia with ingrowing nail medial border right great toe #2 eccrine poroma left forefoot  Plan of Care:  -Patient evaluated.  -Excisional debridement of the eccrine poroma was performed today using a 312 scalpel without incident or bleeding.  Salicylic acid applied.  Recommend OTC salicylic acid PRN -Advised against going barefoot.  Recommend good supportive shoes and sneakers even around the house -Discussed treatment alternatives and plan of care. Explained nail avulsion procedure and post procedure course to patient. -Patient opted for permanent partial nail avulsion of the ingrown portion of the nail.  -Prior to procedure, local anesthesia infiltration utilized using 3 ml of a 50:50 mixture of 2% plain lidocaine  and 0.5% plain marcaine  in a normal hallux block fashion and a betadine prep performed.  -Partial permanent nail avulsion with chemical matrixectomy performed using 3x30sec applications of phenol followed by alcohol flush.  -Light dressing applied.  Post care instructions provided -Return to clinic 3 weeks  Thresa EMERSON Sar, DPM [facility]  Dr. Thresa EMERSON Sar, DPM    [address] [place], [place] [address]                Office [phone]  Fax [phone]

## 2023-08-09 NOTE — Patient Instructions (Signed)

## 2023-08-18 ENCOUNTER — Ambulatory Visit: Admitting: Internal Medicine

## 2023-09-05 ENCOUNTER — Telehealth: Admitting: Physician Assistant

## 2023-09-05 DIAGNOSIS — Z760 Encounter for issue of repeat prescription: Secondary | ICD-10-CM | POA: Diagnosis not present

## 2023-09-05 DIAGNOSIS — J45901 Unspecified asthma with (acute) exacerbation: Secondary | ICD-10-CM | POA: Diagnosis not present

## 2023-09-05 MED ORDER — ALBUTEROL SULFATE HFA 108 (90 BASE) MCG/ACT IN AERS: 2.0000 | INHALATION_SPRAY | Freq: Four times a day (QID) | RESPIRATORY_TRACT | 0 refills | Status: AC | PRN

## 2023-09-05 MED ORDER — FLUTICASONE PROPIONATE HFA 110 MCG/ACT IN AERO: 2.0000 | INHALATION_SPRAY | Freq: Two times a day (BID) | RESPIRATORY_TRACT | 0 refills | Status: AC

## 2023-09-05 NOTE — Patient Instructions (Signed)
 Brittany [name], thank you for joining [name], PA-C for today's virtual visit.  While this provider is not your primary care provider (PCP), if your PCP is located in our provider database this encounter information will be shared with them immediately following your visit.   A [facility] MyChart account gives you access to today's visit and all your visits, tests, and labs performed at [facility]  click here if you don't have a [facility] MyChart account or go to [email or web address]  Consent: (Patient) Brittany [name] provided verbal consent for this virtual visit at the beginning of the encounter.  Current Medications:  Current Outpatient Medications:    albuterol  (VENTOLIN  HFA) 108 (90 Base) MCG/ACT inhaler, Inhale 2 puffs into the lungs every 6 (six) hours as needed for wheezing or shortness of breath., Disp: 8 g, Rfl: 0   acetaminophen  (TYLENOL ) 500 MG tablet, Take 2 tablets (1,000 mg total) by mouth every 6 (six) hours., Disp: 30 tablet, Rfl: 0   ferrous sulfate  325 (65 FE) MG tablet, Take 325 mg by mouth daily with breakfast., Disp: , Rfl:    fluticasone  (FLOVENT  HFA) 110 MCG/ACT inhaler, Inhale 2 puffs into the lungs in the morning and at bedtime., Disp: 1 each, Rfl: 0   lidocaine  (LIDOCAINE  PAIN RELIEF MAX ST) 4 %, Place 1 patch onto the skin daily., Disp: 12 patch, Rfl: 0   oxyCODONE  (OXY IR/ROXICODONE ) 5 MG immediate release tablet, Take 1-2 tablets (5-10 mg total) by mouth every 6 (six) hours as needed for severe pain (pain score 7-10) or moderate pain (pain score 4-6)., Disp: 20 tablet, Rfl: 0   predniSONE  (STERAPRED UNI-PAK 21 TAB) 10 MG (21) TBPK tablet, Take following package directions., Disp: 21 tablet, Rfl: 0   sertraline  (ZOLOFT ) 100 MG tablet, Take 100 mg by mouth daily., Disp: , Rfl:    Medications ordered in this encounter:  Meds ordered this encounter  Medications   albuterol  (VENTOLIN  HFA) 108 (90 Base) MCG/ACT inhaler    Sig:  Inhale 2 puffs into the lungs every 6 (six) hours as needed for wheezing or shortness of breath.    Dispense:  8 g    Refill:  0    Supervising Provider:   LAMPTEY, PHILIP O [id number]   fluticasone  (FLOVENT  HFA) 110 MCG/ACT inhaler    Sig: Inhale 2 puffs into the lungs in the morning and at bedtime.    Dispense:  1 each    Refill:  0    Supervising Provider:   BLAISE ALEENE KIDD [id number]     *If you need refills on other medications prior to your next appointment, please contact your pharmacy*  Follow-Up: Call back or seek an in-person evaluation if the symptoms worsen or if the condition fails to improve as anticipated.  [facility] [phone]  Other Instructions Please report to the nearest Emergency room with any worsening symptoms. Follow up with primary care provider (PCP) in 2 -3 days.    If you have been instructed to have an in-person evaluation today at a local Urgent Care facility, please use the link below. It will take you to a list of all of our available [facility], including address, phone number and hours of operation. Please do not delay care.  [facility]  If you or a family member do not have a primary care provider, use the link below to schedule a visit and establish care. When you choose a [facility]  primary care physician or advanced practice provider, you gain a long-term partner in health. Find a Primary Care Provider  Learn more about [facility]'s in-office and virtual care options: [facility] - Get Care Now

## 2023-09-05 NOTE — Progress Notes (Signed)
 Virtual Visit Consent   Brittany [name], you are scheduled for a virtual visit with a [facility] provider today. Just as with appointments in the office, your consent must be obtained to participate. Your consent will be active for this visit and any virtual visit you may have with one of our providers in the next 365 days. If you have a MyChart account, a copy of this consent can be sent to you electronically.  As this is a virtual visit, video technology does not allow for your provider to perform a traditional examination. This may limit your provider's ability to fully assess your condition. If your provider identifies any concerns that need to be evaluated in person or the need to arrange testing (such as labs, EKG, etc.), we will make arrangements to do so. Although advances in technology are sophisticated, we cannot ensure that it will always work on either your end or our end. If the connection with a video visit is poor, the visit may have to be switched to a telephone visit. With either a video or telephone visit, we are not always able to ensure that we have a secure connection.  By engaging in this virtual visit, you consent to the provision of healthcare and authorize for your insurance to be billed (if applicable) for the services provided during this visit. Depending on your insurance coverage, you may receive a charge related to this service.  I need to obtain your verbal consent now. Are you willing to proceed with your visit today? Brittany [name] has provided verbal consent on 09/05/2023 for a virtual visit (video or telephone). Brittany [name], [place]  Date: 09/05/2023 4:56 PM   Virtual Visit via Video Note   I, Brittany [name], connected with  [name]  ([id number], 08-06-1990) on 09/05/23 at  4:45 PM EDT by a video-enabled telemedicine application and verified that I am speaking with the correct person using two identifiers.  Location: Patient: Virtual Visit  Location Patient: Home Provider: Virtual Visit Location Provider: Home Office   I discussed the limitations of evaluation and management by telemedicine and the availability of in person appointments. The patient expressed understanding and agreed to proceed.    History of Present Illness: Brittany [name] is a 33 y.o. who identifies as a female who was assigned female at birth, and is being seen today for medication refill.  HPI: Medication Refill This is a chronic problem. The current episode started yesterday. The problem occurs intermittently. The problem has been unchanged. Pertinent negatives include no abdominal pain, anorexia, arthralgias, change in bowel habit, chest pain, chills, congestion, coughing, diaphoresis, fatigue, fever, headaches, joint swelling, myalgias, nausea, neck pain, numbness, rash, sore throat, swollen glands, urinary symptoms, vertigo, visual change, vomiting or weakness. The symptoms are aggravated by coughing. She has tried rest for the symptoms. The treatment provided mild relief.    Problems:  Patient Active Problem List   Diagnosis Date Noted   Marijuana use 12/13/2022   Pre-existing hypertension affecting pregnancy in second trimester 12/13/2022   History of depression 12/13/2022   Antepartum hemorrhage from placenta previa 12/12/2022   Tobacco smoker, 1 pack of cigarettes or less per day 11/20/2022   Asthma 02/25/2018    Allergies:  Allergies  Allergen Reactions   Iodine Other (See Comments)    blistering   Sulfonamide Derivatives Other (See Comments)    blistering   Medications:  Current Outpatient Medications:    acetaminophen  (TYLENOL ) 500 MG tablet, Take 2 tablets (1,000  mg total) by mouth every 6 (six) hours., Disp: 30 tablet, Rfl: 0   albuterol  (VENTOLIN  HFA) 108 (90 Base) MCG/ACT inhaler, Inhale 2 puffs into the lungs every 6 (six) hours as needed for wheezing or shortness of breath., Disp: 8.5 g, Rfl: 0   ferrous sulfate  325 (65 FE)  MG tablet, Take 325 mg by mouth daily with breakfast., Disp: , Rfl:    fluticasone  (FLOVENT  HFA) 110 MCG/ACT inhaler, Inhale 2 puffs into the lungs in the morning and at bedtime., Disp: 1 each, Rfl: 0   lidocaine  (LIDOCAINE  PAIN RELIEF MAX ST) 4 %, Place 1 patch onto the skin daily., Disp: 12 patch, Rfl: 0   oxyCODONE  (OXY IR/ROXICODONE ) 5 MG immediate release tablet, Take 1-2 tablets (5-10 mg total) by mouth every 6 (six) hours as needed for severe pain (pain score 7-10) or moderate pain (pain score 4-6)., Disp: 20 tablet, Rfl: 0   predniSONE  (STERAPRED UNI-PAK 21 TAB) 10 MG (21) TBPK tablet, Take following package directions., Disp: 21 tablet, Rfl: 0   sertraline  (ZOLOFT ) 100 MG tablet, Take 100 mg by mouth daily., Disp: , Rfl:    VENTOLIN  HFA 108 (90 Base) MCG/ACT inhaler, TAKE 2 PUFFS BY MOUTH EVERY 6 HOURS AS NEEDED FOR WHEEZE OR SHORTNESS OF BREATH, Disp: 36 each, Rfl: 2  Observations/Objective: Patient is well-developed, well-nourished in no acute distress.  Resting comfortably  at home.  Head is normocephalic, atraumatic.  No labored breathing.  Speech is clear and coherent with logical content.  Patient is alert and oriented at baseline.    Assessment and Plan: 1. Exacerbation of asthma, unspecified asthma severity, unspecified whether persistent  2. Medication refill (Primary)  Patient with history of asthma presenting for med refill. I advised the patient that this would be her last telehealth appointment for asthma. Refills of fluticasone  and albuterol  provided. Instructed the patient that she will need to follow up with her pulmonologist. IF her symptoms worsen she is to report to the nearest  immediately. All questions answered. No acute or emergent conditions identified.   Follow Up Instructions: I discussed the assessment and treatment plan with the patient. The patient was provided an opportunity to ask questions and all were answered. The patient agreed with the plan and  demonstrated an understanding of the instructions.  A copy of instructions were sent to the patient via MyChart unless otherwise noted below.    The patient was advised to call back or seek an in-person evaluation if the symptoms worsen or if the condition fails to improve as anticipated.    Brittany Shuck, PA-C

## 2023-09-22 ENCOUNTER — Ambulatory Visit: Admitting: Nurse Practitioner

## 2023-10-05 ENCOUNTER — Ambulatory Visit: Admitting: Internal Medicine

## 2023-10-13 ENCOUNTER — Ambulatory Visit: Admitting: Internal Medicine

## 2023-10-13 NOTE — Progress Notes (Deleted)
 [facility] Consultation      Date: 10/13/2023,   MRN# [id number] Brittany [name] 07-30-1990  Brittany [name] is a 33 y.o. old female seen in consultation for *** at the request of ***.     CHIEF COMPLAINT:      HISTORY OF PRESENT ILLNESS      Patient is seen today for problems and issues with sleep related to excessive daytime sleepiness Patient  has been having sleep problems for many years Patient has been having excessive daytime sleepiness for a long time Patient has been having extreme fatigue and tiredness, lack of energy +  very Loud snoring every night + struggling breathe at night and gasps for air   Discussed sleep data and reviewed with patient.  Encouraged proper weight management.  Discussed driving precautions and its relationship with hypersomnolence.  Discussed operating dangerous equipment and its relationship with hypersomnolence.  Discussed sleep hygiene, and benefits of a fixed sleep waked time.  The importance of getting eight or more hours of sleep discussed with patient.  Discussed limiting the use of the computer and television before bedtime.  Decrease naps during the day, so night time sleep will become enhanced.  Limit caffeine , and sleep deprivation.  HTN, stroke, and heart failure are potential risk factors.   Discussed risk of untreated sleep apnea including cardiac arrhthymias, stroke, DM, pulm HTN.    EPWORTH SLEEP SCORE***   PAST MEDICAL HISTORY   Past Medical History:  Diagnosis Date   Antepartum hemorrhage from placenta previa 12/12/2022   Asthma    Chlamydia    Gonorrhea    History of postpartum hemorrhage 05/18/2019   Hx MRSA infection 02/16/2003   Trichimoniasis      SURGICAL HISTORY   Past Surgical History:  Procedure Laterality Date   CESAREAN SECTION  12/13/2022   Procedure: CESAREAN SECTION;  Surgeon: Connell Davies, MD;  Location: [facility] ORS;  Service: Obstetrics;;   MULTIPLE  TOOTH EXTRACTIONS       FAMILY HISTORY   Family History  Problem Relation Age of Onset   Healthy Mother    Cancer Father        lung   Asthma Father    Hypertension Father    Heart disease Father    Lung disease Father    COPD Father    Healthy Brother    Healthy Brother    Healthy Brother    Hypertension Maternal Grandmother    Cancer Maternal Grandmother        cancer dx 2017   Diabetes Maternal Grandmother    Cancer Maternal Grandfather        unknown   Anesthesia problems Neg Hx    Other Neg Hx      SOCIAL HISTORY   Social History   Tobacco Use   Smoking status: Every Day    Current packs/day: 0.00    Average packs/day: 0.3 packs/day for 5.0 years (1.3 ttl pk-yrs)    Types: Cigarettes    Start date: 12/28/2007    Last attempt to quit: 12/27/2012    Years since quitting: 10.8   Smokeless tobacco: Never  Vaping Use   Vaping status: Never Used  Substance Use Topics   Alcohol use: No   Drug use: Not Currently    Types: Marijuana    Comment: quit marijuana 2 weeks ago     MEDICATIONS    Home Medication:  Current Outpatient Rx   Order #: [id number] Class: Print   Order #:  [id number] Class: Normal   Order #: [id number] Class: Historical Med   Order #: [id number] Class: Normal   Order #: [id number] Class: Normal   Order #: [id number] Class: Normal   Order #: [id number] Class: Normal   Order #: [id number] Class: Historical Med    Current Medication:  Current Outpatient Medications:    acetaminophen  (TYLENOL ) 500 MG tablet, Take 2 tablets (1,000 mg total) by mouth every 6 (six) hours., Disp: 30 tablet, Rfl: 0   albuterol  (VENTOLIN  HFA) 108 (90 Base) MCG/ACT inhaler, Inhale 2 puffs into the lungs every 6 (six) hours as needed for wheezing or shortness of breath., Disp: 8 g, Rfl: 0   ferrous sulfate  325 (65 FE) MG tablet, Take 325 mg by mouth daily with breakfast., Disp: , Rfl:    fluticasone  (FLOVENT  HFA) 110 MCG/ACT inhaler, Inhale 2 puffs into the lungs in the  morning and at bedtime., Disp: 1 each, Rfl: 0   lidocaine  (LIDOCAINE  PAIN RELIEF MAX ST) 4 %, Place 1 patch onto the skin daily., Disp: 12 patch, Rfl: 0   oxyCODONE  (OXY IR/ROXICODONE ) 5 MG immediate release tablet, Take 1-2 tablets (5-10 mg total) by mouth every 6 (six) hours as needed for severe pain (pain score 7-10) or moderate pain (pain score 4-6)., Disp: 20 tablet, Rfl: 0   predniSONE  (STERAPRED UNI-PAK 21 TAB) 10 MG (21) TBPK tablet, Take following package directions., Disp: 21 tablet, Rfl: 0   sertraline  (ZOLOFT ) 100 MG tablet, Take 100 mg by mouth daily., Disp: , Rfl:     ALLERGIES   Iodine and Sulfonamide derivatives   There were no vitals taken for this visit.   Review of Systems: Gen:  Denies  fever, sweats, chills weight loss  HEENT: Denies blurred vision, double vision, ear pain, eye pain, hearing loss, nose bleeds, sore throat Cardiac:  No dizziness, chest pain or heaviness, chest tightness,edema, No JVD Resp:   No cough, -sputum production, -shortness of breath,-wheezing, -hemoptysis,  Other:  All other systems negative   Physical Examination:   General Appearance: No distress  EYES PERRLA, EOM intact.   NECK Supple, No JVD Pulmonary: normal breath sounds, No wheezing.  CardiovascularNormal S1,S2.  No m/r/g.   Abdomen: Benign, Soft, non-tender. Neurology UE/LE 5/5 strength, no focal deficits Ext pulses intact, cap refill intact ALL OTHER ROS ARE NEGATIVE      IMAGING    No results found.    ASSESSMENT/PLAN     Assessment & Plan OSA (obstructive sleep apnea)  Moderate persistent reactive airway disease without complication           MEDICATION ADJUSTMENTS/LABS AND TESTS ORDERED:    CURRENT MEDICATIONS REVIEWED AT LENGTH WITH PATIENT TODAY   Patient  satisfied with Plan of action and management. All questions answered   Follow up    I spent a total of *** minutes dedicated to the care of this patient on the date of this  encounter to include pre-visit review of records, face-to-face time with the patient discussing conditions above, post visit ordering of testing, clinical documentation with the electronic health record, making appropriate referrals as documented, and communicating necessary information to the patient's healthcare team.    The Patient requires high complexity decision making for assessment and support, frequent evaluation and titration of therapies, application of advanced monitoring technologies and extensive interpretation of multiple databases.  Patient satisfied with Plan of action and management. All questions answered    Nickolas Alm Cellar, M.D.  [facility]  Medical Director [facility] Medical Director  [facility] Cardio-Pulmonary Department
